# Patient Record
Sex: Male | Born: 1935 | Race: White | Hispanic: No | Marital: Married | Smoking: Former smoker
Health system: Southern US, Community
[De-identification: ages and names within clinical notes are randomized; demographics above are authoritative.]

## PROBLEM LIST (undated history)

## (undated) DIAGNOSIS — Z951 Presence of aortocoronary bypass graft: Secondary | ICD-10-CM

## (undated) DIAGNOSIS — C61 Malignant neoplasm of prostate: Secondary | ICD-10-CM

## (undated) DIAGNOSIS — Z8546 Personal history of malignant neoplasm of prostate: Secondary | ICD-10-CM

## (undated) DIAGNOSIS — M1990 Unspecified osteoarthritis, unspecified site: Secondary | ICD-10-CM

## (undated) DIAGNOSIS — E785 Hyperlipidemia, unspecified: Secondary | ICD-10-CM

## (undated) DIAGNOSIS — Z972 Presence of dental prosthetic device (complete) (partial): Secondary | ICD-10-CM

## (undated) DIAGNOSIS — I480 Paroxysmal atrial fibrillation: Secondary | ICD-10-CM

## (undated) DIAGNOSIS — R7989 Other specified abnormal findings of blood chemistry: Secondary | ICD-10-CM

## (undated) DIAGNOSIS — I2510 Atherosclerotic heart disease of native coronary artery without angina pectoris: Secondary | ICD-10-CM

## (undated) DIAGNOSIS — E0590 Thyrotoxicosis, unspecified without thyrotoxic crisis or storm: Secondary | ICD-10-CM

## (undated) HISTORY — PX: PROSTATECTOMY: SHX69

## (undated) HISTORY — PX: TOTAL HIP ARTHROPLASTY: SHX124

## (undated) HISTORY — DX: Thyrotoxicosis, unspecified without thyrotoxic crisis or storm: E05.90

## (undated) HISTORY — DX: Other specified abnormal findings of blood chemistry: R79.89

## (undated) HISTORY — PX: APPENDECTOMY: SHX54

## (undated) HISTORY — DX: Personal history of malignant neoplasm of prostate: Z85.46

## (undated) HISTORY — DX: Paroxysmal atrial fibrillation: I48.0

## (undated) HISTORY — PX: MOHS SURGERY: SUR867

## (undated) HISTORY — DX: Hyperlipidemia, unspecified: E78.5

## (undated) HISTORY — PX: IR ESOPHAGUS DILITATION RETRO FLUORO: IMG5574

## (undated) HISTORY — PX: HERNIA REPAIR: SHX51

## (undated) HISTORY — DX: Atherosclerotic heart disease of native coronary artery without angina pectoris: I25.10

---

## 1999-06-30 ENCOUNTER — Encounter: Payer: Self-pay | Admitting: *Deleted

## 1999-06-30 ENCOUNTER — Ambulatory Visit (HOSPITAL_COMMUNITY): Admission: RE | Admit: 1999-06-30 | Discharge: 1999-07-01 | Payer: Self-pay | Admitting: *Deleted

## 2000-02-21 ENCOUNTER — Ambulatory Visit (HOSPITAL_COMMUNITY): Admission: RE | Admit: 2000-02-21 | Discharge: 2000-02-21 | Payer: Self-pay | Admitting: *Deleted

## 2000-08-17 ENCOUNTER — Emergency Department (HOSPITAL_COMMUNITY): Admission: EM | Admit: 2000-08-17 | Discharge: 2000-08-17 | Payer: Self-pay | Admitting: Emergency Medicine

## 2000-08-17 ENCOUNTER — Encounter: Payer: Self-pay | Admitting: Emergency Medicine

## 2000-08-30 ENCOUNTER — Inpatient Hospital Stay (HOSPITAL_COMMUNITY): Admission: EM | Admit: 2000-08-30 | Discharge: 2000-08-30 | Payer: Self-pay | Admitting: Emergency Medicine

## 2000-08-30 ENCOUNTER — Encounter: Payer: Self-pay | Admitting: Neurology

## 2001-11-11 ENCOUNTER — Encounter: Payer: Self-pay | Admitting: Emergency Medicine

## 2001-11-11 ENCOUNTER — Inpatient Hospital Stay (HOSPITAL_COMMUNITY): Admission: EM | Admit: 2001-11-11 | Discharge: 2001-11-13 | Payer: Self-pay | Admitting: Emergency Medicine

## 2002-04-09 ENCOUNTER — Encounter: Payer: Self-pay | Admitting: Emergency Medicine

## 2002-04-09 ENCOUNTER — Observation Stay (HOSPITAL_COMMUNITY): Admission: EM | Admit: 2002-04-09 | Discharge: 2002-04-10 | Payer: Self-pay | Admitting: Emergency Medicine

## 2004-02-17 ENCOUNTER — Other Ambulatory Visit: Payer: Self-pay

## 2004-02-17 ENCOUNTER — Emergency Department: Payer: Self-pay | Admitting: Emergency Medicine

## 2004-06-19 ENCOUNTER — Ambulatory Visit: Payer: Self-pay | Admitting: General Surgery

## 2004-06-19 ENCOUNTER — Other Ambulatory Visit: Payer: Self-pay

## 2004-07-20 ENCOUNTER — Ambulatory Visit: Payer: Self-pay

## 2004-08-19 ENCOUNTER — Ambulatory Visit: Payer: Self-pay

## 2005-02-18 ENCOUNTER — Observation Stay (HOSPITAL_COMMUNITY): Admission: EM | Admit: 2005-02-18 | Discharge: 2005-02-19 | Payer: Self-pay | Admitting: Emergency Medicine

## 2006-01-10 HISTORY — PX: CARDIAC CATHETERIZATION: SHX172

## 2006-09-10 ENCOUNTER — Other Ambulatory Visit: Payer: Self-pay

## 2006-09-10 ENCOUNTER — Emergency Department: Payer: Self-pay | Admitting: Internal Medicine

## 2006-09-17 ENCOUNTER — Emergency Department: Payer: Self-pay | Admitting: Emergency Medicine

## 2006-09-17 ENCOUNTER — Inpatient Hospital Stay (HOSPITAL_COMMUNITY): Admission: AD | Admit: 2006-09-17 | Discharge: 2006-09-21 | Payer: Self-pay | Admitting: Cardiovascular Disease

## 2008-03-24 ENCOUNTER — Ambulatory Visit: Payer: Self-pay | Admitting: Cardiology

## 2008-03-30 ENCOUNTER — Emergency Department: Payer: Self-pay | Admitting: Emergency Medicine

## 2008-03-31 ENCOUNTER — Ambulatory Visit: Payer: Self-pay | Admitting: Cardiovascular Disease

## 2009-01-19 ENCOUNTER — Ambulatory Visit: Payer: Medicare Other | Admitting: Family Medicine

## 2009-06-09 ENCOUNTER — Encounter: Payer: Self-pay | Admitting: Cardiovascular Disease

## 2009-06-17 ENCOUNTER — Ambulatory Visit: Payer: Self-pay | Admitting: Cardiovascular Disease

## 2009-06-17 DIAGNOSIS — I10 Essential (primary) hypertension: Secondary | ICD-10-CM | POA: Insufficient documentation

## 2009-06-17 DIAGNOSIS — Z8679 Personal history of other diseases of the circulatory system: Secondary | ICD-10-CM

## 2009-06-17 DIAGNOSIS — E785 Hyperlipidemia, unspecified: Secondary | ICD-10-CM

## 2009-06-17 DIAGNOSIS — I2510 Atherosclerotic heart disease of native coronary artery without angina pectoris: Secondary | ICD-10-CM

## 2009-12-15 ENCOUNTER — Ambulatory Visit: Payer: Medicare Other | Admitting: Family Medicine

## 2010-02-09 NOTE — Assessment & Plan Note (Signed)
Summary: F/U 1 YEAR  Medications Added AMIODARONE HCL 200 MG TABS (AMIODARONE HCL) once daily VYTORIN 10-40 MG TABS (EZETIMIBE-SIMVASTATIN) Take one tablet by mouth dailyat bedtime PLAVIX 75 MG TABS (CLOPIDOGREL BISULFATE) Take one tablet by mouth daily ASPIRIN 81 MG TBEC (ASPIRIN) Take one tablet by mouth daily MIRALAX  POWD (POLYETHYLENE GLYCOL 3350) as directed CLONAZEPAM 0.5 MG TABS (CLONAZEPAM) as needed LOSARTAN POTASSIUM 50 MG TABS (LOSARTAN POTASSIUM) Take 1 tablet by mouth once a day      Allergies Added: ! * STATINS  Visit Type:  1year f/u Primary Provider:  Ailene Ards, M.D.  CC:  pt. previously seen by Dr. Daleen Squibb.  No cardiac complaints..  History of Present Illness: [name] is a very pleasant 77 year old gentleman with a history of coronary artery disease, stent placed to his LAD in 1993, stent placed to his mid RCA in 2003, in-stent restenosis with repeat stent placed to his LAD in September 08, stent placed to his RCA at the same time and await, stress test in February 2009 which showed no ischemia, also with history of atrial fibrillation per his report of these details are unavailable to [place] at this time was maintained on low dose amiodarone who presents to reestablish care. He is been seen recently by myself at [facility].  Overall he states that he is doing well. He is not exercising like he should. He would like to do some light weights and treadmill at home. He denies any shortness of breath or chest pain. He is tolerating his medications well.  Preventive Screening-Counseling & Management  Alcohol-Tobacco     Smoking Status: quit  Caffeine-Diet-Exercise     Does Patient Exercise: no      Drug Use:  no.    Current Medications (verified): 1)  Amiodarone Hcl 200 Mg Tabs (Amiodarone Hcl) .... Once Daily 2)  Vytorin 10-40 Mg Tabs (Ezetimibe-Simvastatin) .... Take One Tablet By Mouth Dailyat Bedtime 3)  Plavix 75 Mg Tabs (Clopidogrel  Bisulfate) .... Take One Tablet By Mouth Daily 4)  Aspirin 81 Mg Tbec (Aspirin) .... Take One Tablet By Mouth Daily 5)  Micardis 20 Mg Tabs (Telmisartan) .... Take One Tablet By Mouth Daily 6)  Miralax  Powd (Polyethylene Glycol 3350) .... As Directed 7)  Clonazepam 0.5 Mg Tabs (Clonazepam) .... As Needed  Allergies (verified): 1)  ! * Statins  Past History:  Family History: Last updated: 06/17/2009 Family History of Coronary Artery Disease: mother & sister. Family History of Diabetes: father & sister.  Social History: Last updated: 06/17/2009 Retired --[employer] Married  Tobacco Use - Former.  Alcohol Use - yes Regular Exercise - no Drug Use - no  Risk Factors: Exercise: no (06/17/2009)  Risk Factors: Smoking Status: quit (06/17/2009)  Past Medical History: CAD Hyperlipidemia Hypertension Hyperthyroidism  Past Surgical History: stents x 5 Appendectomy Prostatectomy  Family History: Family History of Coronary Artery Disease: mother & sister. Family History of Diabetes: father & sister.  Social History: Retired --[employer] Married  Tobacco Use - Former.  Alcohol Use - yes Regular Exercise - no Drug Use - no Smoking Status:  quit Does Patient Exercise:  no Drug Use:  no  Review of Systems  The patient denies fever, weight loss, weight gain, vision loss, decreased hearing, hoarseness, chest pain, syncope, dyspnea on exertion, peripheral edema, prolonged cough, abdominal pain, incontinence, muscle weakness, depression, and enlarged lymph nodes.    Vital Signs:  Patient profile:   77 year old male  Height:      74 inches Weight:      169 pounds BMI:     21.78 Pulse rate:   58 / minute BP sitting:   121 / 74  (left arm) Cuff size:   large  Vitals Entered By: Bishop Dublin, CMA (June 17, 2009 3:28 PM)  Physical Exam  General:  Well developed, well nourished, in no acute distress. Head:  normocephalic and atraumatic Neck:  Neck supple, no JVD.  No masses, thyromegaly or abnormal cervical nodes. Lungs:  Clear bilaterally to auscultation and percussion. Heart:  Non-displaced PMI, chest non-tender; regular rate and rhythm, S1, S2 without murmurs, rubs or gallops. Carotid upstroke normal, no bruit.. Pedals normal pulses. No edema, no varicosities. Abdomen:  Bowel sounds positive; abdomen soft and non-tender without masses Msk:  Back normal, normal gait. Muscle strength and tone normal. Pulses:  pulses normal in all 4 extremities Extremities:  No clubbing or cyanosis. Neurologic:  Alert and oriented x 3. Skin:  Intact without lesions or rashes. Psych:  Normal affect.    EKG  Procedure date:  06/17/2009  Findings:      normal sinus rhythm with rate 58 beats per minute, first degree AV block, no significant ST or T wave changes.  Impression & Recommendations:  Problem # 1:  CAD, NATIVE VESSEL (ICD-414.01) history of coronary artery disease, negative stress test in February 2009, last stent placed to his LAD and RCA in September 2008.  No symptoms of angina. I've encouraged him to increase his exercise.  His updated medication list for this problem includes:    Plavix 75 Mg Tabs (Clopidogrel bisulfate) .[name]... Take one tablet by mouth daily    Aspirin 81 Mg Tbec (Aspirin) .[name]... Take one tablet by mouth daily  Problem # 2:  HYPERLIPIDEMIA-MIXED (ICD-272.4) cholesterol is reasonably well controlled. His LDL is slightly above goal at 79 though cholesterol is 139, HDL 40. We will continue him on his current dose of Vytorin 10/40 mg daily.  His updated medication list for this problem includes:    Vytorin 10-40 Mg Tabs (Ezetimibe-simvastatin) .[name]... 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 We have offered to change this to losartan and he will take one pill a day.  His updated medication list for this problem includes:    Aspirin 81  Mg Tbec (Aspirin) .[name]... Take one tablet by mouth daily    Losartan Potassium 50 Mg Tabs (Losartan potassium) .[name]... Take 1 tablet by mouth once a day  Problem # 4:  ATRIAL FIBRILLATION (ICD-427.31) He is current on low-dose amiodarone. Will try to obtain the old records to document atrial fibrillation history.  His updated medication list for this problem includes:    Amiodarone Hcl 200 Mg Tabs (Amiodarone hcl) ..... Once daily    Plavix 75 Mg Tabs (Clopidogrel bisulfate) .[name]... Take one tablet by mouth daily    Aspirin 81 Mg Tbec (Aspirin) .[name]... Take one tablet by mouth daily  Patient Instructions: 1)  Your physician has recommended you make the following change in your medication: Stop micardis and start losartan 50 mg daily 2)  Your physician wants you to follow-up in:   1 year You will receive a reminder letter in the mail two months in advance. If you don't receive a letter, please call our office to schedule the follow-up appointment. Prescriptions: LOSARTAN POTASSIUM 50 MG TABS (LOSARTAN POTASSIUM) Take 1 tablet  by mouth once a day  #30 x 11   Entered by:   Benedict Needy, RN   Authorized by:   Dossie Arbour MD   Signed by:   Benedict Needy, RN on 06/17/2009   Method used:   Electronically to        [facility] [address] (retail)       [address] [place], [place]  [address]       Ph: [phone]       Fax: [phone]   RxID:   [id number]

## 2010-05-25 NOTE — Assessment & Plan Note (Signed)
[facility] OFFICE NOTE   NAME:Mcgaugh, KIN GALBRAITH                       MRN:          [id number]  DATE:03/31/2008                            DOB:          04-May-1932    PRIMARY CARE PHYSICIAN:  Nilda Simmer, MD   HISTORY OF PRESENT ILLNESS:  Mr. Sondgeroth is a pleasant 77 year old  Caucasian male with a history of paroxysmal atrial fibrillation,  coronary artery disease, status post multiple stenting procedures,  hypertension, hyperlipidemia, and thyroiditis who has been followed in  this office by Dr. Valera Castle.  The patient is added on today to see me  after he was seen in the emergency room last night.  He tells me that on  Saturday he and his wife were lifting some heavy objects and he thinks  he pulled a muscle in his chest Walk[place].  It was noted to be tender over  his substernal area and persisted throughout the night on Saturday and  through the day on Sunday.  He was seen in the emergency room at  [facility] where he was found to have normal  cardiac enzymes and no significant abnormalities in his blood work.  His  EKG showed no ischemic changes.  He was given narcotics and a muscle  relaxant which he took last night.  He tells me that he feels the  medications he took made him feel a little nauseous this morning.  His  chest discomfort has gotten better overnight.  He can touch the middle  of his chest Sedivy and one particular spot seems to cause a painful  sensation.  Once again, there was no radiation of this pain into his  neck, jaw, or arm.  There was no associated diaphoresis, nausea,  vomiting, palpitations, or shortness of breath.   CURRENT MEDICATIONS:  1. Amiodarone 200 mg once daily.  2. Micardis 20 mg once daily.  3. Plavix 75 mg once daily.  4. Vytorin 10/40 mg once daily.  5. Aspirin 81 mg once daily.  6. Polyethylene glycol 17 g once daily.  7. Clonazepam 0.5 mg at  bedtime.  8. Vitamin D 1000 units once daily.  9. Zyrtec over-the-counter 10 mg once daily.   REVIEW OF SYSTEMS:  As stated in the history of present illness is  otherwise negative.   PHYSICAL EXAMINATION:  VITAL SIGNS:  Blood pressure 129/74, pulse 48 and  regular, respirations 12 and unlabored.  GENERAL:  This is a pleasant, middle-aged Caucasian male in no acute  distress.  He is alert and oriented x3.  HEENT:  Normal.  SKIN:  Warm and dry.  NECK:  No JVD.  No carotid bruits.  No thyromegaly.  No lymphadenopathy.  LUNGS:  Clear to auscultation bilaterally without wheezes, rhonchi, or  crackles noted.  CARDIOVASCULAR:  Bradycardia without murmurs, gallops, or rubs noted.  ABDOMEN:  Soft and nontender.  Bowel sounds are present.  EXTREMITIES:  No evidence of edema.  Pulses are 2+ in all extremities.   DIAGNOSTIC STUDIES:  1. A 12-lead EKG obtained in the emergency  room at [facility] on March 31, 2007, shows sinus bradycardia with      first-degree AV block.  There are no ischemic changes noted.  2. Laboratory values from March 30, 2008, at [facility] shows a white blood cell count 6.3, hemoglobin 13, platelets      190, glucose 95, BUN 23, creatinine 1.33, sodium 140, potassium      4.0, troponin less than 0.1, total CK 60, and CK-MB 0.7.  3. Chest x-ray obtained at [facility] on March 30, 2008, shows no acute disease process in the chest.   ASSESSMENT AND PLAN:  This is a pleasant 77 year old Caucasian male with  a history of coronary artery disease, paroxysmal atrial fibrillation,  hypertension, hyperlipidemia, and thyroiditis, who presents today for  followup after he was seen in the emergency room with complaints of  reproducible mid chest pain.  It was most likely secondary to a  musculoskeletal cause.  The pain is almost completely resolved today.  I  do not think that his chest pain  represents a cardiac problem.  As  stated above his cardiac enzymes showed no changes and his EKG showed no  ischemic changes.  I do not think that any further ischemic workup is  necessary at this time.  His blood pressure is well controlled.  He is  known to have a slow heart rate and is asymptomatic with a heart rate of  48 today.  I would like to continue all his medications as currently  written.  He will continue to live a healthy lifestyle and will follow  up with Dr. Daleen Squibb as previously scheduled in 1 year.  The patient is  aware that he should call us should he have any change in his medical  status over the next year.  We will also call in nitroglycerin 0.4 mg  sublingually for him to use on an as-needed basis.      Verne Carrow, MD  Electronically Signed    CM/MedQ  DD: 03/31/2008  DT: 04/01/2008  Job #: [id number]

## 2010-05-25 NOTE — Discharge Summary (Signed)
NAMECHRISTOPHER, [name] NO.:  [id number]   MEDICAL RECORD NO.:  [id number]          PATIENT TYPE:  INP   LOCATION:  6532                         FACILITY:  [facility]   PHYSICIAN:  Nicki Guadalajara, M.D.     DATE OF BIRTH:  [birth year]   DATE OF ADMISSION:  09/17/2006  DATE OF DISCHARGE:  09/21/2006                               DISCHARGE SUMMARY   DISCHARGE DIAGNOSES:  1. Unstable angina pectoris, status post two interventions during this      admission.  2. Known history of coronary artery disease with previous percutaneous      coronary interventions.  3. Hypertension.  4. Hyperlipidemia, treated.  5. Paroxysmal atrial fibrillation on amiodarone, maintained sinus      rhythm/sinus bradycardia.   HOSPITAL COURSE:  This is a 77 year old, Caucasian gentleman with known  history of coronary artery disease and previous intervention in 1990,  with Taxus stent placed in proximal LAD and also status post drug-  eluting stent of the RCA in 2003.  He developed chest pain compatible  with unstable angina and presented to the emergency room at [facility] where he was evaluated and transported to [facility]  with unstable angina for further cardiology workup.  The patient was  transferred to [facility] on IV heparin and nitroglycerin and  Integrilin for heart catheterization.   Heart catheterization was performed on September 18, 2006, by Dr. Tresa Endo  and showed a high-grade stenosis of the LAD and 95% stenosis as well as  some in-stent restenosis of 40-50%.  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  Dr. Tresa Endo performed  the stenting of the LAD with drug-eluting Promise stent and the patient  was transported to the post catheterization unit in stable condition.  Because of his residual disease in the RCA, on September 20, 2006, the  patient underwent RCA stenting also performed by Dr. Tresa Endo.  At this  time,  Cypher stent was placed in the coronary artery.  The patient  tolerated this procedure well and was transferred to the unit in stable  condition.  He was able to ambulate around the ward after his bed rest  expired.   LABORATORY DATA AND X-RAY FINDINGS:  BMET showed sodium of 138,  potassium 4.0, chloride 109, CO2 24, BUN 10, creatinine 1.23, glucose  117.  Hemoglobin was 11.9, hematocrit 35.5, white blood cell count 7.3,  platelet 182.  Cardiac panel was negative before and after the first  intervention and the enzymes post second procedure revealed mild  elevation of troponin to 0.08 from the previous level of 0.04 yesterday.  CK was 49 and CK-MB 1.3.  This mild elevation of troponins was possibly  related to mild reperfusion effects.  TSH was normal and liver enzymes  were within normal limits as well.   DISCHARGE MEDICATIONS:  1. Plavix 75 mg daily.  2. Aspirin 325 mg daily.  3. Amiodarone 200 mg daily.  4. Micardis 40 mg daily.  5. Protonix 40 mg b.i.d.  6. Vytorin 10/20 mg daily.  7. Nitroglycerin was 0.4 mg sublingual p.r.n. every 5 minutes x3.   DIET:  Stay on low-fat, low-cholesterol diet.   ACTIVITY:  No driving or lifting greater than 5 pounds x5 days post  catheterization.   FOLLOWUP:  Dr. [name] will see the patient in our office on September  23, at 9:30 a.m.      Raymon Mutton, P.A.    ______________________________  Nicki Guadalajara, M.D.    MK/MEDQ  D:  09/21/2006  T:  09/22/2006  Job:  [id number]   cc:   Nilda Simmer, M.D.  [facility]

## 2010-05-25 NOTE — Cardiovascular Report (Signed)
NAME:  [name], [name] NO.:  [id number]   MEDICAL RECORD NO.:  [id number]          PATIENT TYPE:  INP   LOCATION:  6532                         FACILITY:  [facility]   PHYSICIAN:  Nicki Guadalajara, M.D.     DATE OF BIRTH:  [birth year]   DATE OF PROCEDURE:  09/20/2006  DATE OF DISCHARGE:                            CARDIAC CATHETERIZATION   INDICATIONS:  The patient is a 77 year old gentleman who has known prior  coronary artery disease and underwent initial PTCA of his LAD in 1990,  PTCA stenting of his LAD in June 2001, and stenting of his mid right  coronary artery in 2003.  On September 18, 2006, he underwent repeat  cardiac catheterization and was found to have a 95% stenosis in a gap  between the two previous placed LAD stents with diffuse 40-50% narrowing  within the proximal LAD stent.  He underwent successful stenting with a  2.75 x 28 mm drug-eluting Promos stent postdilated to 3.03 mm in the  previously placed non drug-eluting stents to the LAD and covering the  gap stenosis.  He also was found to have mild disease in his circumflex  vessel, and a widely patent mid RCA stent was 75% stenosis proximal to  the stented segment in the right coronary artery.  He is now following  hydration.  He is now brought back to the catheterization laboratory for  attempt at intervention to the RCA.   PROCEDURE:  After premedication with Valium 5 mg intravenously, the  patient was prepped and draped in usual fashion.  A 6-French diagnostic  catheter was used for relook into the left coronary system and with a  demonstration of widely patent stent, the decision was to proceed with  intervention to the RCA system.  Bivalirudin was used for  anticoagulation.  The patient was already on Plavix.  Initially an FL-4  guide 6-French was inserted.  IC nitroglycerin was administered down the  RCA.  Prowater wire was advanced down the RCA.  It was felt that an 18  mm stent was necessary in  order to overlap the previously placed stent  in the mid segment which was a Cypher stent 13 mm.  A 3.5 x 18 mm Cypher  stent was then inserted but due to backup difficulties, this was never  able to traverse the proximal sharp bend in the RCA.  After multiple  attempts, the whole system was then removed and a 6-French hockey-stick  guide with side holes was inserted.  Wire was re-advanced down the RCA.  A 3.5 x 18 mm Cypher stent was unable to make the proximal sharp bend of  the RCA and was placed in tandem to the previously placed mid RCA stent  extending proximally.  Two dilatations were done up to 16 atmospheres.  A 4 x 12 mm Quantum balloon was used for post-stent dilatation with  dilatation up to 3.9 mm.  Scout angiography confirmed an excellent  angiographic result.  There was brisk TIMI-3 flow.  There was no  evidence for dissection.  He tolerated the procedure well.   HEMODYNAMIC  DATA:  Central aortic pressure was 162/91.  The patient did  receive several doses of IC nitroglycerin.   ANGIOGRAPHIC DATA:  Initial look in the left coronary system revealed  widely patent stented segment in the LAD without any flow limitation to  the diagonal vessel.   The right coronary artery had an upward takeoff and then had 80%  somewhat eccentric stenosis beyond the proximal bend and before the next  bend and prior to the previously placed mid RCA stent.  Following  successful insertion of a 3.5 x 18 mm Cypher stent placed in tandem  proximally to the previously placed stent and postdilated to 3.9 mm, the  entire region was reduced to 0%.  There is brisk TIMI-3 flow.  There was  no evidence for dissection.   IMPRESSION:  1. Successful percutaneous coronary intervention to the right coronary      artery with ultimate insertion of a 3.5 x 18 mm Cypher stent placed      in tandem proximally to the previously placed mid right coronary      artery Cypher stent      and postdilated 3.9 mm done  with bivalirudin anticoagulation in      addition to the patient being on Plavix and utilization of IC      nitroglycerin.  2. Widely patent stent in the left anterior descending system.           ______________________________  Nicki Guadalajara, M.D.     TK/MEDQ  D:  09/20/2006  T:  09/21/2006  Job:  [id number]   cc:   [name], MD  Nilda Simmer, M.D.

## 2010-05-25 NOTE — Assessment & Plan Note (Signed)
[facility] OFFICE NOTE   NAME:Lowman, MADDEX GARLITZ                       MRN:          [id number]  DATE:03/24/2008                            DOB:          March 17, 1932    I was asked by Dr. Nilda Simmer to establish and to consult on Thiago Ragsdale for his cardiovascular and cardiac issues.   HISTORY OF PRESENT ILLNESS:  He is a pleasant, inquisitive 77 year old  married white male who comes with his wife today to establish with our  practice.   From extensive outside records which I have reviewed from [facility], Mr. Cozzens initially had coronary artery  disease as diagnosed in 65 at [facility].  At that time, he had a PTCA of  the LAD.  It is not clear whether or not he had a stent to the LAD at  that time.   He has subsequently had stenting to his mid right coronary artery in  2003.  In September 2008, he developed some chest discomfort, worrisome  for unstable angina leading to admission to [facility].  He came back to [facility] where he underwent cardiac catheterization  by Dr. Nicki Guadalajara, showed in-stent restenosis of the LAD stent and a  95% stenosis between the gap of the previously placed second stent.  He  subsequently underwent stent placement to the LAD.  Two days later, he  underwent successful intervention to the right coronary artery with a  Cypher stent.  This was placed proximally to the previously placed  stent.  A stress nuclear study in February 2009 showed normal perfusion  and excellent exercise capacity and normal left ventricular function  with an EF of 81%.   He is currently having no angina or ischemic symptoms.  He is wanted to  know if he has had too many stents and just how many stents can you  have?   PAST MEDICAL HISTORY:  He also had a history of paroxysmal atrial  fibrillation.  He is on amiodarone.  Apparently, he had some  thyroiditis  that was treated by Dr. Sharin Grave with steroids back in 2006.  He has  not been back for followup, though he was told at that time he had some  nodules.  His wife would very much like him to go back and I have agreed  with this plan.   He has a history of hypertension and hyperlipidemia.   CURRENT MEDICATIONS:  1. Amiodarone 2 mg a day.  2. Micardis 20 mg a day.  3. Plavix 75 mg a day.  4. Vytorin 10/40 daily.  5. Polyethylene glycol at bedtime.  6. Clonazepam 0.5 mg 1 at bedtime.  7. Vitamin D3 1000 units daily.  8. Zyrtec over-the-counter 10 mg a day.  9. He carries sublingual nitroglycerin.   He does not smoke or drink.  He drinks half a cup of wine per day.  He  walks two and a quarter miles 2-3 times per week.   SURGICAL HISTORY:  He has had a  hernia repair, prostatectomy for  prostate cancer, appendectomy.   FAMILY HISTORY:  Significant for premature coronary disease.  His sister  died at age 14 of heart disease in mother died at age 10.   SOCIAL HISTORY:  He is married.  He is retired since May 2000.  He is  married and has 3 children.   ALLERGIES:  He lists STATINS and AMBIEN as allergies, though he is on  Vytorin.   REVIEW OF SYSTEMS:  Other than some weight loss is negative.  He is  plagued by chronic constipation.  Rest of the review of systems are  negative.   PHYSICAL EXAMINATION:  VITAL SIGNS:  His blood pressure today was  132/70, his pulse is 59 and regular.  His height is 6 feet 2, weight is  170 pounds.  HEENT:  Essentially normal.  NECK:  Supple.  Carotid upstrokes were equal bilaterally without bruits.  Thyroid, I could not feel any nodules or any tenderness.  Trachea is  midline.  There is no nodules or nodes present that I could appreciate.  CHEST:  Lungs are clear to auscultation and percussion.  HEART:  Nondisplaced PMI, normal S1 and S2.  ABDOMEN:  Soft, good bowel sounds.  No midline bruit.  No hepatomegaly.  EXTREMITIES:  No  cyanosis, clubbing, or edema.  Pulses were present and  normal.  There is no sign of DVT.  NEURO:  Intact.  PSYCH:  He is pleasant but a little bit cynical.   EKG shows sinus bradycardia the first-degree AV block at 232  milliseconds.  Compared to a previous EKG, there has been no significant  change.   Outside records have been reviewed.  Recent blood work was stable  including a TSH and PSA.  His lipids are being followed by Dr. Katrinka Blazing.  In addition, he has had carotid Dopplers which show no significant  obstructive plaque and he has also had abdominal ultrasound which showed  no aneurysm.   ASSESSMENT AND PLAN:  Mr. Andreoni is stable at the present time.  He is on  a good medical program which I will not change.  He had multiple  questions and I have tried to answer all of them.  I have advised that  he go back and see Dr. Patrecia Pace for his thyroid abnormalities.  His TSH  is normal, however.   I will see him back in a year.  At that time, we will arrange for an  objective assessment is coronary disease.     Thomas C. Daleen Squibb, MD, [facility]  Electronically Signed    TCW/MedQ  DD: 03/24/2008  DT: 03/25/2008  Job #: [id number]   cc:   Nilda Simmer, M.D.

## 2010-05-25 NOTE — Discharge Summary (Signed)
[name], [name] NO.:  [id number]   MEDICAL RECORD NO.:  [id number]          PATIENT TYPE:  INP   LOCATION:  6532                         FACILITY:  [facility]   PHYSICIAN:  Nicki Guadalajara, M.D.     DATE OF BIRTH:  06/07/1932   DATE OF ADMISSION:  09/17/2006  DATE OF DISCHARGE:  09/21/2006                               DISCHARGE SUMMARY   DISCHARGE DIAGNOSES:  1. Unstable angina pectoris status post two interventions during this      admission by Dr. Tresa Endo.  2. Known previous coronary artery disease with intervention.  3. Paroxysmal atrial fibrillation maintaining sinus rhythm/sinus      bradycardia on amiodarone therapy.  4. Hypertension.  5. Hyperlipidemia - treated.  6. History of left ventricular dysfunction with ejection fraction 40-      45% - normally up now by catheterization this admission.  7. Small abdominal aortic aneurysm.   HISTORY OF PRESENT ILLNESS:  This is a 77 year old pleasant Caucasian  gentleman patient of Dr. [name] who presented to the emergency room on  September 17, 2006, with complaints of chest pain.  Initially, the  patient went to [facility], was given nitroglycerin with relief  of pain, but then his high blood pressure dropped to 90s and heart rate  to   Dictation ended at this point      Raymon Mutton, P.A.    ______________________________  Nicki Guadalajara, M.D.    MK/MEDQ  D:  09/21/2006  T:  09/21/2006  Job:  [id number]

## 2010-05-25 NOTE — Cardiovascular Report (Signed)
NAME:  [name], [name] NO.:  [id number]   MEDICAL RECORD NO.:  [id number]          PATIENT TYPE:  INP   LOCATION:  2316                         FACILITY:  [facility]   PHYSICIAN:  Nicki Guadalajara, M.D.     DATE OF BIRTH:  1932-02-05   DATE OF PROCEDURE:  09/18/2006  DATE OF DISCHARGE:                            CARDIAC CATHETERIZATION   INDICATIONS:  [name] is a 77 year old gentleman who has known  coronary artery disease and is status post prior percutaneous  intervention with initial PTCA of his LAD in 1990, and repeat PTCA and  stenting of his LAD in June 2001.  At that time, he had a 2.75 x 18-mm  Taxus stent placed just proximal to the diagonal vessel and after the  diagonal vessel had a 2.5 x 8-mm stent.  He also is status post  intervention utilizing a drug-eluting stent to his right coronary artery  in 2003.  Recently, the patient has developed chest pain syndrome  compatible with unstable angina, leading to his [facility] evaluation and ultimate transport to [facility]  with unstable angina.  He has been on Integrilin, heparin, in addition  to IC nitroglycerin.  Definitive catheterization is now performed.   PROCEDURE:  After premedication with initially morphine, the patient  prepped and draped in the usual fashion.  Right femoral artery was  punctured anteriorly, and a 5-French sheath was inserted.  Diagnostic  catheterization was done utilizing 5-French Judkins for left and right  coronary catheters.  A 5-French pigtail catheter was used for biplane  single left  ventriculography, as well as distal aortography.  With a  demonstration of a subtotal LAD lesion just between the two previously  placed LAD stents in a gap where the diagonal arose as well as mild to  moderate in-stent restenosis in the proximal LAD stent, the decision was  made to attempt intervention.  The patient was given 600 mg of oral  Plavix.  His  5-French sheath was upgraded to a 6-French system.  He was  given 4000 units of heparin and was maintained on his Integrilin  infusion.  After demonstration of therapeutic anticoagulation, a FL 6-  [place] Q-curve 3.5 guide was used for the intervention.  A long Luge  wire was advanced down the LAD.  Initial dilatation was done with a 2.5  x 20-mm Maverick balloon with dilatation within the proximal LAD stent  as well as in the gap in the proximal portion of the stent just beyond  the diagonal vessel.  Since it was felt to cover most of the previously  stented segment, a 28-mm stent should do this.  A 2.75 x 28-mm Promus  stent was then successfully inserted and deployed x2 up to 14  atmospheres.  Post-stent dilatation was done utilizing a 3.0 x 20-mm  Quantum balloon with dilatation up to 3.03 mm.  Scout angiography  confirmed an excellent angiographic result with brisk TIMI 3 flow and no  evidence for dissection.   HEMODYNAMIC DATA:  Central aortic pressure was 141/73.  Left left  ventricular pressure was 144/12.  Central aortic pressure is 144/70.   ANGIOGRAPHIC DATA:  Left main coronary artery was angiographically  normal and bifurcated into an LAD and left circumflex system.   The LAD had a stent placed just beyond first diagonal vessel in the  region of the septal perforating artery extending just proximal to the  second diagonal vessel.  There was diffuse 40-50% in-stent narrowing  within this stented segment.  Just beyond the stent in the gap prior to  the second stent, there was focal 95% stenosis in the region of the  diagonal takeoff.  The 2.5 x 8-mm stent was placed just after the  diagonal vessel.  There was mild 30% narrowings in the LAD distally in  the mid distal segment.   The circumflex vessel was a moderated size vessel.  Gave rise to one  first marginal vessel that had diffuse luminal irregularity of 20%.  There was 50% narrowing in the circumflex between the OM1  and OM2  takeoff.   The right coronary artery had a mild upper takeoff.  There was 75%  stenosis beyond the proximal bend but proximal to the previously placed  Cypher stent which was widely patent without restenosis.  The RCA  supplied a PDA, inferior LV branch and posterolateral vessel.   Biplane single left ventriculography revealed normal LV contractility  without focal segmental Rosetti motion abnormalities.   Distal aortography was done.  There was no evidence for renal artery  stenosis.  There was mild aneurysmal dilatation of the distal infrarenal  aorta, and this was mildly tortuous.  There was mild 20% narrowing in  the right common iliac artery.   Following successful percutaneous coronary intervention to the LAD with  initial predilatation with a 2.5 x 20-mm Maverick balloon, insertion of  a 2.75 x 28-mm drug-eluting stent which covered the previously placed  stents, the 95% focal narrowing and the previous in-stent narrowing were  all reduced to 0%.  There was brisk TIMI 3 flow without evidence for  dissection.   IMPRESSION:  1. Normal LV function.  2. Progressive coronary artery disease with previously placed 2.75 x      18-mm stent in the LAD proximally with in-stent narrowing of 40-50%      followed by 95% focal stenosis and a gap between the previously      placed second stent at the region of the diagonal 2 take off with      mild 30% LAD narrowings in the distal segment; 20% luminal      irregularity in the OM1 vessel, the circumflex with 50% narrowing      in the AV groove circumflex between OM1/OM2; and widely patent mid      RCA stent but evidence for 75% eccentric stenosis proximal to the      previously placed stent.  3. Mild aneurysmal dilatation of the infrarenal aorta.  4. Successful percutaneous coronary intervention with ultimate      insertion of a new 2.75 x 28-mm drug-eluting Promus stent      postdilated to 3.03 mm in the left anterior descending  artery with      the stenoses being reduced to 0%, done with Integrilin/weight-      adjusted heparinization and oral Plavix.  5. Probable planned stage intervention to the RCA proximally following      hydration.           ______________________________  Nicki Guadalajara, M.D.     TK/MEDQ  D:  09/18/2006  T:  09/19/2006  Job:  [id number]   cc:   Nicki Guadalajara, M.D.  [name], M.D.  Nilda Simmer, M.D.

## 2010-05-28 NOTE — H&P (Signed)
NAME:  [name], [name] NO.:  [id number]   MEDICAL RECORD NO.:  [id number]                   PATIENT TYPE:  INP   LOCATION:  6531                                 FACILITY:  [facility]   PHYSICIAN:  [name], M.D.             DATE OF BIRTH:  07/22/1932   DATE OF ADMISSION:  04/09/2002  DATE OF DISCHARGE:                                HISTORY & PHYSICAL   CHIEF COMPLAINT:  Irregular heart rate.   HISTORY OF PRESENT ILLNESS:  The patient is a 77 year old male with a  history of coronary disease and PAF.  He has been in sinus rhythm on  Amiodarone 100 mg a day and aspirin.  His last catheterization was November  2003 and at that time he had a Sypher Stent placed to his RCA.  His EF at  that time was 40 to 45%.  Yesterday evening, he noted an irregular heart  rhythm.  He denies any associated chest pain or shortness of breath.  In the  emergency room, he apparently converted spontaneously, unfortunately at the  time of this dictation, no  or telemetry strips from the emergency room are available.  He is now in  sinus rhythm, sinus bradycardia.  The patient says he did take an extra 200  mg of Amiodarone last night.   PAST MEDICAL HISTORY:  Remarkable for coronary artery disease.  He had an  LAD angioplasty in 1993 at [facility].  He had an LAD stent placed in June 2001.  He had a negative Cardiolite study in January 2003 but had persistent chest  pain and ultimately was catheterized November 2003 and underwent RCA  stenting.  The LAD site was okay at that time.  He has moderate to mild LV  dysfunction with EF of 40 to 45%.  He has treated hyperlipidemia, his last  LDL was 77 in November 2003.  He has a history of an elevated PSA and Dr.  [name] his primary care doctor is to evaluate this further.  He has mild  hypertension.  He had bilateral hernia repair in the past.  He has a history  of erectile dysfunction and does not take beta blockers.   CURRENT  MEDICATIONS:  1. Lipitor 20 mg q.d.  2. Amiodarone 100 mg q.d.  3. Hydrochlorothiazide 12.5 q.d.  4. Aspirin one a day.   ALLERGIES:  He is intolerant to NIASPAN and AMBIEN but has no other drug  allergies.   SOCIAL HISTORY:  He is married.  He is a nonsmoker.  He has three children  and three grandchildren.   FAMILY HISTORY:  Remarkable for diabetes and coronary artery disease.  His  mother died in her 41s of an MI and also diabetes.  He had one sister who  died in her 37s complications of diabetes and heart attack.  His brother is  followed by Dr. Elsie Lincoln, he has a  history of bypass surgery.   REVIEW OF SYMPTOMS:  The patient says that his blood sugar fasting recently  was slightly elevated at 120.  He has no history of GI bleeding or peptic  ulcer disease.  He has no history of kidney stones or renal disease.  He has  not had a recent fever or chills.  He is not taking any over the counter  medications.  He does wear glasses.   PHYSICAL EXAMINATION:  VITAL SIGNS:  Blood pressure 120/70, pulse 54,  respirations 12.  GENERAL:  He is a well-developed, well-nourished male in no acute distress.  HEENT:  Normocephalic.  Extraocular movements are intact.  Sclerae are  nonicteric.  He does wear glasses.  NECK:  Without JVD and without bruit.  CHEST:  Clear to auscultation and percussion.  CARDIAC:  Reveals a regular rate and rhythm without murmur, rub or gallop.  Normal S1 and S2.  ABDOMEN:  Nontender.  No hepatosplenomegaly.  EXTREMITIES:  Without edema.  Distal pulses are 2+/4.  NEUROLOGIC:  Grossly intact.  He is awake, alert, oriented and cooperative.  He moves all extremities without obvious deficit.   His EKG is pending.   LABORATORY DATA:  Sodium 138, potassium 3.3, BUN 19, creatinine 1.2.  White  count 5.3, hemoglobin 14.7, hematocrit 43, platelets 185.  CK-MB is  negative.  Troponin is negative.  TSH and hemoglobin A1C are pending.   IMPRESSION:  1. Paroxysmal atrial  fibrillation with break through on low dose Amiodarone.  2. Coronary artery disease with left anterior descending artery intervention     in 1993 and left anterior descending artery stenting in June 2001.  Right     coronary artery stenting November 2003.  3. Mild left ventricle dysfunction with ejection fraction of 40 to 45%.  4. Treated hyperlipidemia, his low-density lipoprotein was 77 in November     2003.  5. Elevated blood sugar with strong family history of diabetes, will rule     out diabetes.  6. Mild hypertension.  7. Hypokalemia.  8. History of erectile dysfunction.   PLAN:  We will replace potassium.  Will increase his daily Amiodarone to 200  mg a day.  We will recheck his TSH, this was normal in November but he is on  Amiodarone.  He may need Coumadin,  this will be discussed with M.D.     Abelino Derrick, P.A.                      [name], M.D.    Lenard Lance  D:  04/09/2002  T:  04/09/2002  Job:  [id number]

## 2010-05-28 NOTE — Consult Note (Signed)
[place]  Patient:    [name], [name] Visit Number: [id number] MRN: [id number]          Service Type: MED Location: 1800 [id number] [id number] Attending Physician:  Devoria Albe Adm. Date:  08/30/2000                            Consultation Report  DATE OF BIRTH:  [birth year]  PATIENTS ADDRESS:  [address], [place], [place] [address].  REASON FOR CONSULTATION:  This 77 year old, right-handed, white married male is seen in the emergency room at the request of Dr. Kenn File physician associate for evaluation of dizziness.  HISTORY OF PRESENT ILLNESS:  Mr. Laventure has a known history of coronary artery disease and underwent PTCA at [facility] in 1993. He subsequently had recurrent chest pain and underwent stent therapy x 2 in June of 2001. He has been on amiodarone for a history of paroxysmal atrial fibrillation. He has also been on a beta blocker. He has at least a one-month history of dizziness at times occurring when going to a standing position. He has also had episodes of yawning during the day. He recalls an episode two weeks ago while playing golf when he became very dizzy which continued into the next day, and he was seen at the [facility] Emergency Room without etiology found. He has also had an episode in which he developed double vision after taking Ambien in the past. Last evening, he took a 10 mg Ambien and awoke at 9:30 this morning which is late for him. He noted horizontal diplopia. He noted light headedness when standing, and this improved as he came to the emergency room. There was no chest pain or palpitation. He has noted that in staggering he would stagger both to the left or to the right.  CURRENT MEDICATIONS: 1. Lipitor 40 mg q.d. 2. Cordarone 100 mg q.d. 3. Coumadin 1.25 mg q.d. 4. Toprol XL 1/2 of a 25 mg q.d. 5. Ambien 10 mg q.h.s.  PHYSICAL EXAMINATION:  GENERAL:   Well-developed, pleasant, white male.  VITAL SIGNS:  Blood pressure lying left arm 140/80, standing left arm 140/80, sitting blood pressure right and left arm 140/80; heart rate was 51 to 54 and regular, he had a sinus bradycardia.  NECK:  No bruits were heard.  HEENT:  The tympanic membranes were clear.  NEUROLOGICAL:  Mental status:  He was alert and oriented x 3. Followed one, two, and three step commands. Cranial nerve examination revealed visual fields full. Disks flat. Extraocular movements full. Corneals present. No seventh nerve palsy present. Hearing decreased. Air conduction greater than bone conduction. Tongue was midline. The uvula was midline. Gags present. Bilateral ptosis. Motor exam revealed 5/5 strength proximally and distally in the upper and lower extremities. He had an outstretched hand and arm tremor. Sensory examination was intact to pinprick, touch, position, and vibration testing. Deep tendon reflexes were 2+, and plantar responses were downgoing. Gait examination revealed he is slightly unsteady. With Romberg testing, he would fall to the left and to the right. He did not pass point.  IMPRESSION: 1. Light headed sensation without definite history of vertigo, code 780.4. 2. Diplopia, code 368.2. 3. Possible medication affects, code 905.2. 4. Coronary artery disease, code 429.2. 5. Sinus bradycardia, code 427.9. 6. Benign essential tremor, code 333.1. 7. History of paroxysmal atrial fibrillation, code 427.31.  PLAN:  The  plan at this time is to obtain an MRI with intracranial MRA to rule out basilar artery stenosis and discharge him from the ER if these studies are unremarkable with discontinuation of his beta blocker. Attending Physician:  Devoria Albe DD:  08/30/00 TD:  08/31/00 Job: [id number]

## 2010-05-28 NOTE — Discharge Summary (Signed)
[name]. [facility]  Patient:    [name], [name]                       MRN: [id number] Adm. Date:  [id number] Disc. Date: [id number] Attending:  [name] Dictator:   Delice Bison Jernejcic, P.A.                           Discharge Summary  ADMITTING DIAGNOSES: 1.  Abnormal cardiolite stress test with exertional angina, elective     cardiac catheterization. 2.  Hyperlipidemia. 3.  Hypertension. 4.  Remote cigar use.  DISCHARGE DIAGNOSES: 1.  Coronary artery disease of left anterior descending coronary artery and     right coronary artery status post LAD PCI. 2.  Hyperlipidemia. 3.  Hypertension. 4.  Remote cigar use.  HISTORY OF PRESENT ILLNESS:  [name] is a pleasant 77 year old married white male patient of Dr. Damien Fusi in [place] referred for evaluation of abnormal stress test.  [name] has a history of coronary artery disease status post percutaneous transluminal coronary angioplasty of what the patient thinks was the left anterior descending coronary artery in 1993.  At that time his blockage was 95% and he was having substernal chest pressure and diaphoresis. He did not have a myocardial infarction. He was told that he had another blockage elsewhere but the details were unclear.  The patient apparently had paroxysmal atrial fibrillation in the past and has been on Coumadin but because of lone atrial fibrillation was taken off and has maintained sinus rhythm for several weeks.  Dr. Damien Fusi did a 2-D echocardiogram and stress Cardiolite to evaluate ischemic component that could contribute to the arrhythmia.  Myocardial perfusion study performed on Jun 10, 1999 revealed ejection fraction of 74% with reversible inferior and anterior ischemia, normal Villada motion.  He also did a 2-D echocardiogram on May 26, 1999 which showed no evidence of mitral, aortic or tricuspid regurgitation and an ejection fraction of 50%.  The patient had some exertional  chest tightness during the stress test and also complains of exertional tightness after prolonged walking.  He does have a strong family history of coronary artery disease.  The patient was be admitted for elective cardiac catheterization June 30, 1999 and was given a prescription for sublingual nitroglycerin.  PROCEDURE:  Cardiac catheterization on June 30, 1999 by Dr. [name].  There were no complications.  HOSPITAL COURSE:  The patient was admitted to [facility] for elective cardiac catheterization on June 30, 1999.  Pre-cath studies were within normal limits.  BUN 16, creatinine 1.4, INR 1.1, hemoglobin 14.0.  Dr. [name] proceeded with cardiac catheterization which revealed a normal left main, 99% proximal left anterior descending coronary artery, 70% mid left anterior descending coronary artery, and diffuse irregularities of the distal left anterior descending coronary artery, normal circumflex system, right coronary artery with 60% mid lesion.  Normal PLA and PDA.  Ejection fraction was 60% and no mitral regurgitation.  Dr. [name] proceeded with left anterior descending coronary artery PCI with percutaneous transluminal coronary angioplasty and stenting at a proximal and mid lesions reducing them to less than 10% each.  The patient tolerated the procedure well.  ReoPro was bolused and infused times 12 hours.  Plavix was started.  There were no problems with the sheath pull post catheterization. BUN and creatinine at discharge were 13 and 0.9.  The patient will be discharged to home on July 01, 1999.  MEDICATIONS ON DISCHARGE:  Flexeril 10 mg q.h.s., atenolol 25 mg in the morning and 25 in the evening, Norvasc 5 mg per day, Lescol 40 mg p.o. b.i.d., enteric coated aspirin 325 mg p.o. q day, Plavix 75 mg per day.  ACTIVITY:  No strenuous activity, no lifting more than 5 pounds or driving for two days.  DIET:  Low fat, low cholesterol, low salt diet.  DISCHARGE  INSTRUCTIONS:  The patient may shower but should not soak in the tub for a week.  He should call the office with any problems or questions.  He is to follow up with Dr. [name] on July 23, 1999 at 12:20.  Long term cardiac follow up will be with Dr. Damien Fusi in [place]. DD:  07/01/99 TD:  07/01/99 Job: [id number]

## 2010-05-28 NOTE — Cardiovascular Report (Signed)
[facility]. [facility]  Patient:    [name], [name]                       MRN: [id number] Proc. Date: 06/30/99 Adm. Date:  [id number] Disc. Date: [id number] Attending:  [name] CC:         Orpah Cobb, M.D., [place], [place]                        Cardiac Catheterization  PROCEDURES: 1. Left heart catheterization. 2. Coronary angiography. 3. Left ventriculogram. 4. LAD - proximal and mid - percutaneous transluminal coronary balloon    angioplasty.  Placement of intracoronary stent x 2.  COMPLICATIONS:  None.  INDICATIONS:  [name] is a 77 year old white male, patient of J. Masoud, M.D. in [place], [place]; referred for cardiac catheterization and possible PTCA after a positive stress Cardiolite.  He has a history of PTCA of his LAD in 1993 at [facility].  He underwent PTCA at that time, but has not been evaluated since then.  Recently he had an episode of paroxysmal atrial fibrillation, and Dr. Juel Burrow followed with a 2d echocardiogram as well as a stress Cardiolite.  The stress Cardiolite performed on Jun 10, 1999 revealed evidence of reversible inferior and anterior Win[place] ischemia, with an EF of 50%.  He is now referred for cardiac catheterization.  DESCRIPTION OF PROCEDURE:  After giving informed written consent, the patient was brought to the cardiac catheterization lab.  His right and left groins were shaved, prepped and draped in the usual sterile fashion.  His ECG monitoring was established.  Using modified Seldinger technique, a 6-French arterial sheath was inserted in the right femoral artery.  Six-French diagnostic catheters were then used to perform diagnostic angiography.  DIAGNOSTIC ANGIOGRAPHY: 1. Left Main Trunk:  Large and with no significant disease. 2. Left Anterior Descending:  A medium-sized vessel which coursed to the apex    and gave rise to three diagonal branches.  The LAD is noted to have a    diffusely  diseased 99% proximal LAD, with an area of high-grade stenosis    (up to 99%), and an area that appeared to have a cap from a chronic    dissection.  There was a mid 70% stenotic lesion, and then mild, diffuse    disease in the distal LAD.  The first, second and third diagonals were    small vessels, with no significant disease. 3. Left Circumflex:  A large vessel which coursed in the AV groove and gave    rise to two obtuse marginal branches.  The AV groove circumflex has no    significant disease.  The first and second obtuse marginals are    medium-sized vessels with no significant disease. 4. Right Coronary Artery:  A medium-sized vessel which is dominant.  It gives    rise to a PDA as well as a posterolateral branch.  The RCA is noted    to have a 60% mid vessel stenotic lesion.  There is a 30% late mid stenotic    lesion.  Both the PDA and the posterolateral branches have no significant    disease.  LEFT VENTRICULOGRAM:  Reveals preserved EF, calculated at 60%.  There is no significant mitral regurgitation.  HEMODYNAMICS: 1. Systemic arterial pressure:  125/68. 2. Left ventricular systemic pressure:  125/10. 3. Left ventricular end-diastolic pressure:  16.  INTERVENTIONAL PROCEDURE:  LAD --  Proximal and Mid --  Following  diagnostic angiography, a 7-French arterial sheath was inserted in the right femoral artery.  A 7-French JL4 guiding catheter was engaged into the left coronary ostium and selective coronary angiogram was performed.  Next, a 0.014 short Hi-Torque Floppy guide wire was advanced along the guiding catheter to the proximal LAD.  The guide wire was then used to cross into the distal LAD without difficulty.  Next, a CrossSail 2.5 x 20 mm balloon was then positioned over the proximal LAD stenotic lesion.  One subsequent inflation to a maximum of 10 atm was then performed for a total of 45 sec.  A follow-up angiogram revealed some luminal gain, however there is  persistence of irregularities with TIMI-3 flow to the distal vessel.  The balloon was then removed and a Tetra 2.75 x 18 mm coronary stent was then tracked into the proximal LAD stenotic lesion, with careful attention to position so as not to jail any diagonal branches.  The stent was then deployed to a maximum of 14 atm for a total of 55 sec.  A follow-up angiogram revealed no evidence of dissection or thrombus, with TIMI-3 flow to the distal vessel and less than 10% residual stenosis.  Our attention was then turned toward the 70% mid vessel stenotic lesion.  The stent balloon was then removed and a second Tetra 2.5 x 8 mm stent was then retracted into the mid LAD; was then deployed into the mid LAD stenotic lesion to approximately 16 atm for a total of one minute.  Follow-up angiogram revealed no evidence of dissection or thrombus, with TIMI-3 flow of the distal vessel.  Because of the complexity of the case, we elected to proceed with ReoPro administration.  The intravenous dose of heparin was given to maintain ACT within 200 and 300.  Final orthogonal angiograms revealed less than 10% residual stenosis in the proximal and mid LAD, with TIMI-3 flow to the distal vessel.  There was no evidence of dissection or thrombus.  At this point we elected to conclude the procedure.  All balloons, wires and catheters were removed.  Hemostatic sheath was sewn in place.  The patient was transferred back to the Ward in stable condition.  CONCLUSIONS: 1. Successful percutaneous transluminal coronary balloon angioplasty and    placement of a Tetra 2.75 x 18 mm coronary stent in the proximal LAD    stenotic lesion. 2. Successful primary stenting of the mid LAD stenotic lesion.  We used a    Tetra 2.5 x 8 mm coronary stent. 3. IGM ______ of ReoPro (abciximab) infusion. 4. Normal LV systolic function.  4. DD:  06/30/99 TD:  07/02/99 Job: [id number]

## 2010-05-28 NOTE — Cardiovascular Report (Signed)
NAME:  [name], [name] NO.:  [id number]   MEDICAL RECORD NO.:  [id number]                   PATIENT TYPE:  INP   LOCATION:  2022                                 FACILITY:  [facility]   PHYSICIAN:  [name], M.D.             DATE OF BIRTH:  [birth year]   DATE OF PROCEDURE:  11/12/2001  DATE OF DISCHARGE:                              CARDIAC CATHETERIZATION   PROCEDURES:  1. Left heart catheterization.  2. Coronary angiography.  3. Left ventriculogram.  4. Right coronary artery - mid:     a. Percutaneous transluminal coronary balloon angioplasty.     b. Placement of intracoronary stent.   COMPLICATIONS:  None.   INDICATIONS:  The patient is a 77 year old male, patient of Dr. Lenise Herald and Dr. [name] in [place] with a history of remote PTCA of his LAD  in 1990.  The patient had a repeat PTCA and stenting x2 of his LAD in June  of 2001.  His last Cardiolite in January of 2003 revealed mild inferior Carrizales  thinning with no significant ischemia.  He was readmitted on November 11, 2001, with atypical chest pain and subsequently ruled out for myocardial  infarction.  He is now brought for repeat catheterization.  The patient also  has a history of paroxysmal atrial fibrillation on chronic amiodarone  therapy.   DESCRIPTION OF PROCEDURE:  After given informed written consent, the patient  was brought to the cardiac catheterization lab where his right and left  groins were shaved, prepped, and draped in the usual sterile fashion.  ECG  monitoring was established.  Using modified Seldinger technique, a #6 French  arterial sheath was inserted in the right femoral artery.  The 6 French  diagnostic catheter was then used to perform diagnostic angiography.  This  revealed a large left main with no significant disease.   The LAD is a medium sized vessel which coursed to the apex and gave rise to  two diagonal branches.  The LAD is noted to have 50%  proximal narrowing.  There is a stent noted in the proximal portion of the LAD which appears  patent.  There is a stent in the midportion of the LAD which appears patent.  The first and second diagonal is a small vessel with no significant disease.   The left circumflex is a medium sized vessel which coursed in the AV groove  and gave rise to three obtuse marginal branches.  The AV groove circumflex  has no significant disease.  The first and second OMs are medium sized  vessels which bifurcate distally and has no significant disease.  The third  OM is a small vessel with no significant disease.   The right coronary artery is a large vessel which is dominant and gives rise  to both the PDA as well as the posterolateral branch.  The RCA is very  tortuous  with a shepherd's crook in the proximal portion with 30% proximal  lesion.  There is a long 70% mid vessel lesion.  There is sequential 40%  lesions in the distal portion of the RCA.  The PDA and posterolateral branch  has no significant disease.   LEFT VENTRICULOGRAM:  Left ventriculogram reveals mildly depressed EF at 40-  45% with mild global hypokinesis.   HEMODYNAMICS:  Systemic arterial pressure 114/68, LV systemic pressure  108/4, LVEDP of 10.   INTERVENTIONAL PROCEDURE:  RCA - mid:  Following diagnostic angiography, a  #6 [place] JR4 guiding catheter with side holes was then coaxially engaged in  the right coronary ostium.  We then attempted to cross the mid RCA stenotic  lesion with a 0.014 short Forte; however, we were unable to position this  into the distal portion of the RCA.  We then attempted to cross with a  Patriot guide wire; however, this was unsuccessful.  We ultimately were able  to cross with a exchange length Hi-Torque Floppy which was positioned in the  distal PDA.  Next, a 2.75 x 15 mm Maverick over-the-wire balloon was then  tracked into the distal portion of the RCA.  The floppy guide wire was then  exchanged  for an exchange length extra-support guide wire.  The balloon was  then pulled back across the stenotic lesion and one subsequent inflation to  10 atmospheres was performed for 37 seconds.  Followup angiogram revealed no  evidence of dissection or thrombosis.  We then were able to track a 3.0 x 13  mm Cypher stent into the mid RCA.  This stent was then deployed to a maximum  of 18 atmospheres for a total of 63 seconds.  Followup angiogram revealed no  evidence of dissection or thrombosis. However, this stent appeared to be un-  deployed in its mid segment.  We then took a 4.0 x 9 mm Maverick balloon and  did one subsequent inflation to 12 atmospheres for a total of 54 seconds  within the stent.  Followup angiogram revealed good stent expansion with  TIMI-3 flow to the distal vessel.  It should be noted this was a difficult  procedure with multiple exchanges of wire secondary to vessel tortuosity.  Final orthogonal angiograms reveal less than 10% residual stenosis in the  mid RCA stenotic lesion with TIMI-3 flow to the distal vessel.  IV  Integrilin was used throughout the case.  IV heparin was given to maintain  the ACT between 200 and 300.   Final orthogonal angiograms reveal approximately 10% residual stenosis in  the mid RCA stenotic lesion with TIMI-3 flow to the distal vessel.  At this  point, we elected to conclude the procedure.  All balloons, wires, and  catheters were removed. Hemostatic sheaths were sewn in place and the  patient was transferred back to the ward in stable condition.   CONCLUSION:  1. Successful percutaneous transluminal coronary balloon angioplasty and     placement of a Cypher 3.0 x 13 mm stent in the mid right coronary artery     stenotic lesion which was ultimately post-dilated to 4.25 mm.  2. Mildly depressed left ventricular systolic function.  3. Adjunct use of Integrilin infusion.  [name], M.D.    RHM/MEDQ  D:  11/12/2001  T:  11/13/2001  Job:  [id number]   cc:   [name], Dr.  Nicholes Rough, [place]

## 2010-05-28 NOTE — Discharge Summary (Signed)
NAME:  [name], [name] NO.:  [id number]   MEDICAL RECORD NO.:  [id number]                   PATIENT TYPE:  INP   LOCATION:  6531                                 FACILITY:  [facility]   PHYSICIAN:  [name], M.D.             DATE OF BIRTH:  1932/04/16   DATE OF ADMISSION:  04/09/2002  DATE OF DISCHARGE:  04/10/2002                                 DISCHARGE SUMMARY   DISCHARGE DIAGNOSES:  1. Paroxysmal atrial fibrillation, converted spontaneously to sinus rhythm     after admission.  2. History of past paroxysmal atrial fibrillation; the patient has been on     amiodarone 100 mg a day and aspirin.  3. Coronary disease, left anterior descending percutaneous coronary     intervention in 1993 at [facility] with left anterior descending stent, June of     2001, and right coronary artery stent, November of 2003.  4. Hypokalemia.  5. Mild left ventricular dysfunction with ejection fraction of 40-45%.  6. Treated hyperlipidemia.  7. Mild hypertension.   HOSPITAL COURSE:  The patient is a 77 year old male with history of coronary  disease and PAF.  He has been in sinus rhythm on low-dose amiodarone and  aspirin.  He present to the emergency room with irregular heart rate.  His  rate was not fast, it was around 80.  He tolerated this well.  He has not  had chest pain.  He was admitted to telemetry and started on IV heparin.  Shortly after admission, he converted spontaneously to sinus bradycardia.  We increased his amiodarone to 200 mg a day and his potassium was replaced.  He was monitored overnight and we feel he can be discharged April 10, 2002.  We did discuss Coumadin and aspirin and Plavix with him.  He has been  somewhat particular about his medications in the past and we told him it was  okay to discuss this with Dr. [name] in followup.  We also suggested he  should be on an ACE inhibitor; he can also discuss this with Dr. [name].   DISCHARGE  MEDICATIONS:  1. Coated aspirin once a day.  2. Amiodarone 200 mg a day.  3. Potassium 10 mEq a day.  4. Hydrochlorothiazide 12.5 daily.  5. Lipitor 20 mg a day.   LABORATORY DATA:  Hemoglobin A1c was 5.6.  Sodium 138, potassium on  admission was 3.3, BUN 19, creatinine 1.2.  LFTs are normal.  CK-MB and  troponins are negative.  White count 5.3, hemoglobin 14.7, hematocrit 43,  platelets 185,000.  TSH is within normal limits.   EKG in sinus rhythm shows sinus bradycardia, heart rate of 52, a QTc of 413.   DISPOSITION:  Patient is discharged in stable condition and will follow up  with Dr. [name].     Abelino Derrick, P.A.  [name], M.D.    [name]  D:  04/10/2002  T:  04/11/2002  Job:  [id number]

## 2010-05-28 NOTE — Discharge Summary (Signed)
NAME:  [name], [name] NO.:  [id number]   MEDICAL RECORD NO.:  [id number]                   PATIENT TYPE:  INP   LOCATION:  6529                                 FACILITY:  [facility]   PHYSICIAN:  [name], M.D.             DATE OF BIRTH:  07/28/1932   DATE OF ADMISSION:  11/11/2001  DATE OF DISCHARGE:  11/13/2001                                 DISCHARGE SUMMARY   PRIMARY CARE PHYSICIAN:  Dr. Thurmond [name].   ADMISSION DIAGNOSES:  1. Unstable angina.  2. Coronary artery disease     a. Status post percutaneous coronary angiography and stent to the left        anterior descending coronary artery in 1990.     b. Status post percutaneous coronary angiography and stent to the left        anterior descending coronary artery x2 in June 2001.     c. Cardiolite in January  2003, showing no significant abnormality.  3. Paroxysmal atrial fibrillation, currently maintaining sinus rhythm with     amiodarone.  4. Hyperlipidemia.  5. Erectile dysfunction.  6. Bradycardia.   DISCHARGE DIAGNOSES:  1. Unstable angina.  2. Coronary artery disease     a. Status post percutaneous coronary angiography and stent to the left        anterior descending coronary artery in 1990.     b. Status post percutaneous coronary angiography and stent to the left        anterior descending coronary artery x2 in June 2001.     c. Cardiolite in January  2003, showing no significant abnormality.  3. Paroxysmal atrial fibrillation, currently maintaining sinus rhythm with     amiodarone.  4. Hyperlipidemia.  5. Erectile dysfunction.  6. Bradycardia.  7. A cardiac catheterization on November 12, 2001, by Dr. Darlin [name]     with a percutaneous coronary angiography and using a Cypher stent to the     mid-right coronary artery.  Ejection fraction of 40%-45%, with mild     global hypokinesis.   HISTORY OF PRESENT ILLNESS:  The patient is a pleasant 77 year old white  married male  with a history of coronary artery disease, status post  percutaneous coronary angiography to the LAD in 1990, and a subsequent  percutaneous coronary angiography and stent to the LAD x2 in June 2001.  He  underwent a Cardiolite in January  2003, that was okay.  He was in his usual  state of health until the day of admission when he developed mid-sternal  chest pain that lasted for one or two hours.  There were no associated  symptoms.  As well, he had recently been feeling fatigued and dizzy, and saw Dr. Thurmond [name],  and labs were drawn.  Carotid Dopplers were performed.  As well, he had been  experiencing some left shoulder and back pain associated with movement.  At  the time of our evaluation, he was pain-free.  An electrocardiogram showed  sinus bradycardia with no ST-T change.  He had used Viagra within 24 hours,  so it was noted not to use any nitroglycerin at that point.  At that time we planned to place him on IV heparin and continue his current  medicines, including aspirin.  Check serial enzymes to rule out a myocardial  infarction.  We would have Dr. Jenne [name] see him in the morning and decide for  the need for cardiac catheterization.   HOSPITAL COURSE:  On November 12, 2001, the patient remained stable with no  further complaints.  The enzymes remained negative.  He was seen and  evaluated by Dr. Jenne [name], who felt that he needed to proceed with a cardiac  catheterization.  On November 12, 2001, the patient underwent a cardiac  catheterization by Dr. Jenne [name].  Please see the dictated report for details.  The left main was very diseased.  The left circumflex and its branches were  very diseased.  The LAD had a proximal 50% stenosis.  The two previously-  placed stents in the LAD had no significant restenosis.  The RCA had a 30%  proximal stenosis and a 70% mid-stenosis.  This is followed by more distal  40% stenosis x2.  Dr. Jenne [name] then proceeded with a percutaneous coronary   angiography using a Maverick balloon.  He then proceeded with stenting using  a Cypher stent.  This was performed to the mid-RCA and went from a 70%  stenosis to a 10% residual.  The ejection fraction was estimated at 40%-45%  with mild global hypokinesis.  He tolerated the procedure well and had no  complication.  He planned for the sheath out when the ACT was less than 175.  Continue on Tequin for 18 hours and then discontinue it.  Plan to continue  Plavix for three months.  On November 13, 2001, the patient remained stable.  He feels well with no  chest pain or shortness of breath.  He is completing his Integrilin infusion  at this time.  His groin sites are well-healed without hematoma, and no  bleed, with minimal ecchymosis.  He is afebrile with pulse of 55, blood  pressure 105/55.  The remainder of his examination is stable.  His labs are  stable.   DISPOSITION:  At this time he is being evaluated by Dr. Jenne [name], who deems  him stable for discharge home.   CONSULTATIONS:  None.   PROCEDURE:  1. Cardiac catheterization on November 12, 2001, by Dr. Jenne [name].  Again, see     his dictated report for details.  The left main appears free of disease.     The left circumflex and its branches appear free of disease.  The     proximal LAD has a 50% stenosis.  The two previously-placed stents have     no significant restenosis.  The RCA has a proximal 30% stenosis.  The mid-     RCA has a 70% stenosis.  This is followed by two distal lesions of 40%     each.  The ejection fraction is estimated at 40%-45% with mild global     hypokinesis.  2. Dr. Jenne [name] then performed a percutaneous coronary angiography using a     Maverick balloon.  He then used a Cypher stent.  Both of these were     placed to the mid-RCA, and this went from 70% to 10% residual.  He  tolerated the procedure well and had no complications.  He planned the    sheaths out when the ACT was less than 175.  Continue on Tequin  for 18     hours, and then discontinue.  Plavix for three months.   LABORATORY DATA:  Cardiac enzymes negative with CK of 76 and 56, MB 1.4 and  1.1, troponin less than 0.01, less than 0.01.  On admission white count 7.6,  hemoglobin 14.4, hematocrit 41.7, platelets 197.  Sodium 136, potassium 4.1,  BUN 14, creatinine 1.1, glucose 109.  Discharge white count 5.5, hemoglobin  12.4, hematocrit 37.8, platelets 188.  Sodium 137, potassium 3.7, BUN 11,  creatinine 1.1, glucose 111.  ELECTROCARDIOGRAM:  Showed sinus bradycardia 53 beats per minute.  No ST-T  change on admission.   DISCHARGE MEDICATIONS:  1. Plavix 75 mg once q.d. for three months.  2. Enteric-coated aspirin 325 mg once q.d.  3. Amiodarone 100 mg once q.d.  4. Lipitor 20 mg once q.d.  5. HCTZ 12.5 mg once q.d.  6. Fish oil capsules as before.  7. Viagra as directed.  8. Nitroglycerin as directed.   ACTIVITY:  No strenuous activity.  No lifting of greater than 5 pounds,  driving, or sexual activity for three days.   DIET:  A low-salt, low-fat, low-cholesterol diet.   INSTRUCTIONS:  Call [phone] if any bleeding or increased signs of pain at  the groin site.   WOUND CARE:  May gently wash the groin site with warm water and soap.   FOLLOW UP:  Follow up with Abelino Derrick, P.A.-C. For Dr. Jenne [name] on  November 28, 2001, at 9:30 a.m.      Mary B. Easley, P.A.-C.                   [name], M.D.    MBE/MEDQ  D:  11/13/2001  T:  11/14/2001  Job:  [id number]   cc:   Dr. Thurmond [name]

## 2010-06-11 ENCOUNTER — Ambulatory Visit (INDEPENDENT_AMBULATORY_CARE_PROVIDER_SITE_OTHER): Payer: BC Managed Care – PPO | Admitting: Cardiovascular Disease

## 2010-06-11 ENCOUNTER — Encounter: Payer: Self-pay | Admitting: Cardiovascular Disease

## 2010-06-11 DIAGNOSIS — I2510 Atherosclerotic heart disease of native coronary artery without angina pectoris: Secondary | ICD-10-CM

## 2010-06-11 DIAGNOSIS — I4891 Unspecified atrial fibrillation: Secondary | ICD-10-CM

## 2010-06-11 DIAGNOSIS — I10 Essential (primary) hypertension: Secondary | ICD-10-CM

## 2010-06-11 DIAGNOSIS — E785 Hyperlipidemia, unspecified: Secondary | ICD-10-CM

## 2010-06-11 MED ORDER — EZETIMIBE-SIMVASTATIN 10-40 MG PO TABS: 1.0000 | ORAL_TABLET | Freq: Every day | ORAL | Status: DC

## 2010-06-11 MED ORDER — AMIODARONE HCL 100 MG PO TABS: 100.0000 mg | ORAL_TABLET | Freq: Every day | ORAL | Status: DC

## 2010-06-11 NOTE — Patient Instructions (Addendum)
Please decrease the amiodarone to a 1/2 pill for one month, then stop if no palpitations You are doing well. Your physician recommends that you return for a FASTING lipid profile: 3 months (Lipid/Lft) Please call us if you have new issues that need to be addressed before your next appt.  We will call you for a follow up Appt. In 12 months

## 2010-06-11 NOTE — Progress Notes (Signed)
   Patient ID: [name], male    DOB: 04-Nov-1932, 77 y.o.   MRN: [id number]  HPI Comments: [name] is a very pleasant 77 year old gentleman with a history of coronary artery disease, stent placed to his LAD in 1993, stent placed to his mid RCA in 2003, in-stent restenosis with repeat stent placed to his LAD in September 08, stent placed to his RCA at the same time, stress test in February 2009 which showed no ischemia, also with history of atrial fibrillation per, maintained on low dose amiodarone who presents 4 routine followup   Overall he states that he is doing well. He is not exercising like he should. [name] He denies any shortness of breath or chest pain. He is tolerating his medications well.He takes care of 2 cats that are diabetic. Also has been taking care of baby rabbits in his backyard. He reports that his weight has been trending downward. He has been cutting his Vytorin and half.  Last labs in March 2012 showed total cholesterol 141, LDL 77, HDL 48. He had decreased his Vytorin and half one week prior to this lab draw.  EKG shows sinus bradycardia with rate 51 beats per minute, no significant ST or T wave changes, first degree AV block noted     Review of Systems  Constitutional: Negative.   HENT: Negative.   Eyes: Negative.   Respiratory: Negative.   Cardiovascular: Negative.   Gastrointestinal: Negative.   Musculoskeletal: Negative.   Skin: Negative.   Neurological: Negative.   Hematological: Negative.   Psychiatric/Behavioral: Negative.   All other systems reviewed and are negative.    BP 100/68  Pulse 50  Ht 6\' 1"  (1.854 m)  Wt 163 lb (73.936 kg)  BMI 21.51 kg/m2   Physical Exam  Nursing note and vitals reviewed. Constitutional: He is oriented to person, place, and time. He appears well-developed and well-nourished.  HENT:  Head: Normocephalic.  Nose: Nose normal.  Mouth/Throat: Oropharynx is clear and moist.  Eyes: Conjunctivae are normal. Pupils are  equal, round, and reactive to light.  Neck: Normal range of motion. Neck supple. No JVD present.  Cardiovascular: Normal rate, regular rhythm, S1 normal, S2 normal, normal heart sounds and intact distal pulses.  Exam reveals no gallop and no friction rub.   No murmur heard. Pulmonary/Chest: Effort normal and breath sounds normal. No respiratory distress. He has no wheezes. He has no rales. He exhibits no tenderness.  Abdominal: Soft. Bowel sounds are normal. He exhibits no distension. There is no tenderness.  Musculoskeletal: Normal range of motion. He exhibits no edema and no tenderness.  Lymphadenopathy:    He has no cervical adenopathy.  Neurological: He is alert and oriented to person, place, and time. Coordination normal.  Skin: Skin is warm and dry. No rash noted. No erythema.  Psychiatric: He has a normal mood and affect. His behavior is normal. Judgment and thought content normal.           Assessment and Plan

## 2010-06-12 NOTE — Assessment & Plan Note (Addendum)
Blood pressure is borderline low. He does not have any symptoms at this time. I suggested that if his blood pressure continues to be low, that he document this and hold his ARB.

## 2010-06-12 NOTE — Assessment & Plan Note (Signed)
We have suggested he stay on the Vytorin one half dose will check his cholesterol in 3 months time to ensure that he is at goal with LDL less than 70

## 2010-06-12 NOTE — Assessment & Plan Note (Signed)
Currently with no symptoms of angina. No further workup at this time. Continue current medication regimen. 

## 2010-06-12 NOTE — Assessment & Plan Note (Signed)
Maintaining sinus rhythm. We will decrease his amiodarone in half.

## 2010-07-01 ENCOUNTER — Encounter: Payer: Self-pay | Admitting: Cardiovascular Disease

## 2010-07-13 ENCOUNTER — Emergency Department: Payer: Medicare Other | Admitting: Unknown Physician Specialty

## 2010-09-20 ENCOUNTER — Ambulatory Visit (INDEPENDENT_AMBULATORY_CARE_PROVIDER_SITE_OTHER): Payer: BC Managed Care – PPO | Admitting: Cardiovascular Disease

## 2010-09-20 ENCOUNTER — Encounter: Payer: Self-pay | Admitting: Cardiovascular Disease

## 2010-09-20 DIAGNOSIS — I4891 Unspecified atrial fibrillation: Secondary | ICD-10-CM

## 2010-09-20 DIAGNOSIS — I2510 Atherosclerotic heart disease of native coronary artery without angina pectoris: Secondary | ICD-10-CM

## 2010-09-20 DIAGNOSIS — E785 Hyperlipidemia, unspecified: Secondary | ICD-10-CM

## 2010-09-20 DIAGNOSIS — I10 Essential (primary) hypertension: Secondary | ICD-10-CM

## 2010-09-20 NOTE — Patient Instructions (Addendum)
You are doing well. Please hold amiodarone. Take only for episodes of atrial fib.  Try to stop the micardis and monitor blood pressure Goal total cholesterol <150, goal LDL <70 Please call us if you have new issues that need to be addressed before your next appt.  We will call you for a follow up Appt. In 12 months

## 2010-09-20 NOTE — Assessment & Plan Note (Signed)
Currently with no symptoms of angina. No further workup at this time. Continue current medication regimen. 

## 2010-09-20 NOTE — Progress Notes (Signed)
Patient ID: [name], male    DOB: 01/14/1932, 77 y.o.   MRN: [id number]  HPI Comments: [name] is a very pleasant 77 year old gentleman with a history of coronary artery disease, stent placed to his LAD in 1993, stent placed to his mid RCA in 2003, in-stent restenosis with repeat stent placed to his LAD in September 08, stent placed to his RCA at the same time, stress test in February 2009 which showed no ischemia, also with history of atrial fibrillation Over 10 years ago, maintained on low dose amiodarone who presents for routine followup.  He reports that he was weaning himself off his amiodarone though scared himself as his blood pressure if him abnormal readings and he put himself back on low-dose amiodarone. We had also discussed weaning him off his Micardis and he has not done this yet and he was afraid of what his blood pressure cuff at read. He otherwise feels well. He is active. He does have poor sleep as his cats wake him up at night time. One of his cats is diabetic. He has not been using his treadmill on a regular basis.   He denies any shortness of breath or chest pain. He is tolerating his medications well.  His weight has been trending downward. He has been cutting his Vytorin in half.  Last labs in March 2012 showed total cholesterol 141, LDL 77, HDL 48. He had decreased his Vytorin and half one week prior to this lab draw. Cholesterol has not been checked. Since he cut his Vytorin to the lower dose  EKG shows sinus bradycardia with rate 56 beats per minute, no significant ST or T wave changes, first degree AV block    Outpatient Encounter Prescriptions as of 09/20/2010  Medication Sig Dispense Refill  . amiodarone (PACERONE) 100 MG tablet Take 50 mg by mouth daily.       [name] aspirin 81 MG EC tablet Take 81 mg by mouth daily.        . clonazePAM (KLONOPIN) 0.5 MG tablet Take 0.25 mg by mouth daily.       . clopidogrel (PLAVIX) 75 MG tablet Take 75 mg by mouth daily.          [name] ezetimibe-simvastatin (VYTORIN) 10-40 MG per tablet Take 0.5 tablets by mouth at bedtime.        . nitroGLYCERIN (NITROSTAT) 0.4 MG SL tablet Place 0.4 mg under the tongue every 5 (five) minutes as needed.        . polyethylene glycol (MIRALAX / GLYCOLAX) packet Take 17 g by mouth daily.        [name] telmisartan (MICARDIS) 20 MG tablet 20 mg. Take 1/2 tablet daily.          Review of Systems  Constitutional: Negative.   HENT: Negative.   Eyes: Negative.   Respiratory: Negative.   Cardiovascular: Negative.   Gastrointestinal: Negative.   Musculoskeletal: Negative.   Skin: Negative.   Neurological: Negative.   Hematological: Negative.   Psychiatric/Behavioral: Positive for sleep disturbance and dysphoric mood.  All other systems reviewed and are negative.    BP 131/79  Pulse 56  Ht 6\' 1"  (1.854 m)  Wt 164 lb (74.39 kg)  BMI 21.64 kg/m2   Physical Exam  Nursing note and vitals reviewed. Constitutional: He is oriented to person, place, and time. He appears well-developed and well-nourished.  HENT:  Head: Normocephalic.  Nose: Nose normal.  Mouth/Throat: Oropharynx is clear and moist.  Eyes: Conjunctivae are  normal. Pupils are equal, round, and reactive to light.  Neck: Normal range of motion. Neck supple. No JVD present.  Cardiovascular: Normal rate, regular rhythm, S1 normal, S2 normal, normal heart sounds and intact distal pulses.  Exam reveals no gallop and no friction rub.   No murmur heard. Pulmonary/Chest: Effort normal and breath sounds normal. No respiratory distress. He has no wheezes. He has no rales. He exhibits no tenderness.  Abdominal: Soft. Bowel sounds are normal. He exhibits no distension. There is no tenderness.  Musculoskeletal: Normal range of motion. He exhibits no edema and no tenderness.  Lymphadenopathy:    He has no cervical adenopathy.  Neurological: He is alert and oriented to person, place, and time. Coordination normal.  Skin: Skin is warm and  dry. No rash noted. No erythema.  Psychiatric: He has a normal mood and affect. His behavior is normal. Judgment and thought content normal.           Assessment and Plan

## 2010-09-20 NOTE — Assessment & Plan Note (Signed)
He is currently in normal sinus rhythm. He has not had any episodes of atrial fibrillation in a decade. I suggested he hold his amiodarone. He could take amiodarone p.r.n. If he does convert to atrial fibrillation.

## 2010-09-20 NOTE — Assessment & Plan Note (Signed)
I suggested he have his cholesterol checked on the half dose Vytorin to ensure that he is at goal with LDL less than 70. He reports having recent blood work done and will try to provide [place] a copy.

## 2010-09-20 NOTE — Assessment & Plan Note (Signed)
Blood pressure is well controlled on today's visit. I suggested that if he would like, he could try to wean himself off the Micardis 10 mg daily, with close monitoring of his blood pressure.

## 2010-10-05 ENCOUNTER — Ambulatory Visit (INDEPENDENT_AMBULATORY_CARE_PROVIDER_SITE_OTHER): Payer: BC Managed Care – PPO | Admitting: Internal Medicine

## 2010-10-05 ENCOUNTER — Encounter: Payer: Self-pay | Admitting: Internal Medicine

## 2010-10-05 DIAGNOSIS — Z23 Encounter for immunization: Secondary | ICD-10-CM

## 2010-10-05 DIAGNOSIS — I10 Essential (primary) hypertension: Secondary | ICD-10-CM

## 2010-10-05 DIAGNOSIS — E785 Hyperlipidemia, unspecified: Secondary | ICD-10-CM

## 2010-10-05 DIAGNOSIS — R079 Chest pain, unspecified: Secondary | ICD-10-CM

## 2010-10-05 DIAGNOSIS — I7090 Unspecified atherosclerosis: Secondary | ICD-10-CM

## 2010-10-05 DIAGNOSIS — G4700 Insomnia, unspecified: Secondary | ICD-10-CM | POA: Insufficient documentation

## 2010-10-05 MED ORDER — CLONAZEPAM 0.5 MG PO TABS: 0.5000 mg | ORAL_TABLET | Freq: Every day | ORAL | Status: DC

## 2010-10-05 MED ORDER — CLOPIDOGREL BISULFATE 75 MG PO TABS: 75.0000 mg | ORAL_TABLET | Freq: Every day | ORAL | Status: DC

## 2010-10-05 NOTE — Progress Notes (Signed)
Subjective:    Patient ID: [name], male    DOB: 1932-06-28, 77 y.o.   MRN: [id number]  HPI 77YO male with h/o CAD presents to establish care. Complains of mid-sternal chest discomfort which does not feel like his previous anginal symptoms. Reports he was mowing a lawn which includes some hills over 5 acres. 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 Denies dyspnea, diaphoresis, palpitations. Pain only occurs with movement.  Pain alleviated after taking Aleve last night.  Otherwise, feeling well.  Outpatient Encounter Prescriptions as of 10/05/2010  Medication Sig Dispense Refill  . aspirin 81 MG EC tablet Take 81 mg by mouth daily.        . clonazePAM (KLONOPIN) 0.5 MG tablet Take 1 tablet (0.5 mg total) by mouth daily.  30 tablet  3  . clopidogrel (PLAVIX) 75 MG tablet Take 1 tablet (75 mg total) by mouth daily.  30 tablet  11  . ezetimibe-simvastatin (VYTORIN) 10-40 MG per tablet Take 0.5 tablets by mouth at bedtime.        . nitroGLYCERIN (NITROSTAT) 0.4 MG SL tablet Place 0.4 mg under the tongue every 5 (five) minutes as needed.        . polyethylene glycol (MIRALAX / GLYCOLAX) packet Take 17 g by mouth daily.        [name] telmisartan (MICARDIS) 20 MG tablet 20 mg. Take 1/2 tablet daily.         Review of Systems  Constitutional: Negative for fever, chills, activity change, appetite change, fatigue and unexpected weight change.  Eyes: Negative for visual disturbance.  Respiratory: Negative for cough and shortness of breath.   Cardiovascular: Positive for chest pain. Negative for palpitations and leg swelling.  Gastrointestinal: Negative for abdominal pain and abdominal distention.  Genitourinary: Negative for dysuria, urgency and difficulty urinating.  Musculoskeletal: Negative for arthralgias and gait problem.  Skin: Negative for color change and rash.  Hematological: Negative for adenopathy.  Psychiatric/Behavioral: Negative for sleep disturbance and dysphoric  mood. The patient is not nervous/anxious.    BP 146/76  Pulse 53  Temp(Src) 97.6 F (36.4 C) (Oral)  Resp 16  Ht 6' 1.5" (1.867 m)  Wt 167 lb (75.751 kg)  BMI 21.73 kg/m2  SpO2 99%     Objective:   Physical Exam  Constitutional: He is oriented to person, place, and time. He appears well-developed and well-nourished. No distress.  HENT:  Head: Normocephalic and atraumatic.  Right Ear: Tympanic membrane, external ear and ear canal normal.  Left Ear: Tympanic membrane, external ear and ear canal normal.  Nose: Nose normal.  Mouth/Throat: Oropharynx is clear and moist. No oropharyngeal exudate.  Eyes: Conjunctivae and EOM are normal. Pupils are equal, round, and reactive to light. Right eye exhibits no discharge. Left eye exhibits no discharge. No scleral icterus.  Neck: Normal range of motion. Neck supple. Carotid bruit is not present. No tracheal deviation present. No thyromegaly present.  Cardiovascular: Normal rate, regular rhythm and normal heart sounds.  Exam reveals no gallop and no friction rub.   No murmur heard. Pulmonary/Chest: Effort normal and breath sounds normal. No respiratory distress. He has no wheezes. He has no rales. He exhibits no tenderness.  Abdominal: Soft. Bowel sounds are normal. He exhibits no distension and no mass. There is no tenderness. There is no rebound and no guarding.  Musculoskeletal: Normal range of motion. He exhibits no edema.  Lymphadenopathy:    He has no cervical adenopathy.  Neurological: He is  alert and oriented to person, place, and time. No cranial nerve deficit. Coordination normal.  Skin: Skin is warm and dry. No rash noted. He is not diaphoretic. No erythema. No pallor.  Psychiatric: He has a normal mood and affect. His behavior is normal. Judgment and thought content normal.          Assessment & Plan:  1. Chest pain - Appears to be related to muscular injury after mowing using a push mower. Continue prn Aleve. RTC if symptoms  worsen or persist.  2. Insomnia - Doing well using prn clonazepam. Will continue.  3. Atherosclerosis - Refill given on Plavix today.

## 2010-10-05 NOTE — Patient Instructions (Signed)
Labs next week. Follow up in 6 months.

## 2010-10-19 ENCOUNTER — Encounter: Payer: Self-pay | Admitting: Internal Medicine

## 2010-10-22 LAB — CBC
HCT: 35.8 — ABNORMAL LOW
Hemoglobin: 11.7 — ABNORMAL LOW
Hemoglobin: 11.8 — ABNORMAL LOW
Hemoglobin: 11.9 — ABNORMAL LOW
Hemoglobin: 12.6 — ABNORMAL LOW
MCHC: 33.5
MCHC: 33.7
MCHC: 33.7
MCV: 85.9
MCV: 86.1
MCV: 86.4
MCV: 86.5
RBC: 4.01 — ABNORMAL LOW
RBC: 4.12 — ABNORMAL LOW
RBC: 4.17 — ABNORMAL LOW
RBC: 4.34
RDW: 13.2
RDW: 13.4
RDW: 13.6
WBC: 8.5

## 2010-10-22 LAB — COMPREHENSIVE METABOLIC PANEL
ALT: 32
AST: 28
Albumin: 3.4 — ABNORMAL LOW
Alkaline Phosphatase: 68
BUN: 17
CO2: 25
Calcium: 8.5
Chloride: 105
Creatinine, Ser: 1.24
GFR calc Af Amer: >60
GFR calc non Af Amer: 57 — ABNORMAL LOW
Glucose, Bld: 112 — ABNORMAL HIGH
Potassium: 4.2
Sodium: 137
Total Bilirubin: 0.9
Total Protein: 6

## 2010-10-22 LAB — BASIC METABOLIC PANEL
CO2: 24
Calcium: 8.5
Calcium: 8.7
Creatinine, Ser: 1.22
Creatinine, Ser: 1.23
GFR calc Af Amer: >60
GFR calc Af Amer: >60
GFR calc non Af Amer: 58 — ABNORMAL LOW
GFR calc non Af Amer: 59 — ABNORMAL LOW
Glucose, Bld: 156 — ABNORMAL HIGH
Sodium: 138
Sodium: 138

## 2010-10-22 LAB — CARDIAC PANEL(CRET KIN+CKTOT+MB+TROPI)
CK, MB: 1.4
CK, MB: 1.8
Relative Index: INVALID
Relative Index: INVALID
Relative Index: INVALID
Relative Index: INVALID
Relative Index: INVALID
Total CK: 49
Total CK: 54
Total CK: 62
Total CK: 76
Total CK: 79
Troponin I: 0.04
Troponin I: 0.04
Troponin I: 0.08 — ABNORMAL HIGH

## 2010-10-22 LAB — HEPARIN LEVEL (UNFRACTIONATED)
Heparin Unfractionated: 0.39
Heparin Unfractionated: 0.67

## 2010-10-22 LAB — MAGNESIUM: Magnesium: 2.2

## 2010-10-22 LAB — PROTIME-INR
INR: 1.1
Prothrombin Time: 14.5

## 2010-10-27 ENCOUNTER — Telehealth: Payer: Self-pay | Admitting: Internal Medicine

## 2010-10-27 NOTE — Telephone Encounter (Signed)
I have no information on this person no phone number or address.

## 2010-10-27 NOTE — Telephone Encounter (Signed)
Message copied by Virgina Evener on Wed Oct 27, 2010 10:51 AM ------      Message from: Ronna Polio A      Created: Mon Oct 18, 2010  7:58 AM      Regarding: labs       I received labs on this patient which were abnormal. I have never seen him before.  Please make sure he has an appointment or has another PCP.

## 2010-10-27 NOTE — Telephone Encounter (Signed)
Can you contact [facility] and make sure they contact him

## 2010-10-29 NOTE — Telephone Encounter (Signed)
Front office is helping search for labs

## 2010-11-19 ENCOUNTER — Ambulatory Visit: Payer: BC Managed Care – PPO | Admitting: Internal Medicine

## 2010-11-22 ENCOUNTER — Encounter: Payer: Self-pay | Admitting: Internal Medicine

## 2010-11-22 ENCOUNTER — Ambulatory Visit (INDEPENDENT_AMBULATORY_CARE_PROVIDER_SITE_OTHER): Payer: BC Managed Care – PPO | Admitting: Internal Medicine

## 2010-11-22 DIAGNOSIS — T148XXA Other injury of unspecified body region, initial encounter: Secondary | ICD-10-CM

## 2010-11-22 DIAGNOSIS — IMO0002 Reserved for concepts with insufficient information to code with codable children: Secondary | ICD-10-CM

## 2010-11-22 DIAGNOSIS — I10 Essential (primary) hypertension: Secondary | ICD-10-CM

## 2010-11-22 DIAGNOSIS — N644 Mastodynia: Secondary | ICD-10-CM

## 2010-11-22 DIAGNOSIS — IMO0001 Reserved for inherently not codable concepts without codable children: Secondary | ICD-10-CM

## 2010-11-22 MED ORDER — AMOXICILLIN-POT CLAVULANATE 875-125 MG PO TABS: 1.0000 | ORAL_TABLET | Freq: Two times a day (BID) | ORAL | Status: AC

## 2010-11-22 MED ORDER — TELMISARTAN 20 MG PO TABS: 10.0000 mg | ORAL_TABLET | Freq: Every day | ORAL | Status: DC

## 2010-11-22 NOTE — Progress Notes (Signed)
Subjective:    Patient ID: [name], male    DOB: Oct 12, 1932, 77 y.o.   MRN: [id number]  HPI 77YO male presents for acute visit c/o bilateral nipple pain and cat bite on right arm.  Notes cat bite occurred approximately 2-3 weeks ago.  Had central laceration with surrounding redness which has improved over last weeks. No fever or chills. No noted enlarged LAD. Bilateral nipple pain began around same time. No nipple discharge noted.  No breast mass noted. No enlargement of breasts.  Pt denies use of any new meds/herbals.  No fatigue or other symptoms.   Review of Systems  Constitutional: Negative for fever, chills, activity change, appetite change, fatigue and unexpected weight change.  Eyes: Negative for visual disturbance.  Respiratory: Negative for cough and shortness of breath.   Cardiovascular: Positive for chest pain (bilateral nipples). Negative for palpitations and leg swelling.  Gastrointestinal: Negative for abdominal pain and abdominal distention.  Genitourinary: Negative for dysuria, urgency and difficulty urinating.  Musculoskeletal: Negative for arthralgias and gait problem.  Skin: Positive for wound. Negative for color change and rash.  Hematological: Negative for adenopathy.  Psychiatric/Behavioral: Negative for sleep disturbance and dysphoric mood. The patient is not nervous/anxious.        Objective:   Physical Exam  Constitutional: He is oriented to person, place, and time. He appears well-developed and well-nourished. No distress.  HENT:  Head: Normocephalic and atraumatic.  Right Ear: External ear normal.  Left Ear: External ear normal.  Nose: Nose normal.  Mouth/Throat: Oropharynx is clear and moist. No oropharyngeal exudate.  Eyes: Conjunctivae and EOM are normal. Pupils are equal, round, and reactive to light. Right eye exhibits no discharge. Left eye exhibits no discharge. No scleral icterus.  Neck: Normal range of motion. Neck supple. No tracheal  deviation present. No thyromegaly present.  Cardiovascular: Normal rate, regular rhythm and normal heart sounds.  Exam reveals no gallop and no friction rub.   No murmur heard. Pulmonary/Chest: Effort normal and breath sounds normal. No respiratory distress. He has no wheezes. He has no rales. He exhibits no tenderness. Right breast exhibits no inverted nipple, no mass, no nipple discharge, no skin change and no tenderness. Left breast exhibits no inverted nipple, no mass, no nipple discharge, no skin change and no tenderness.  Musculoskeletal: Normal range of motion. He exhibits no edema.  Lymphadenopathy:    He has no cervical adenopathy.    He has no axillary adenopathy.       Right axillary: No pectoral adenopathy present.       Left axillary: No pectoral adenopathy present.      Right: No supraclavicular adenopathy present.       Left: No supraclavicular adenopathy present.  Neurological: He is alert and oriented to person, place, and time. No cranial nerve deficit. Coordination normal.  Skin: Skin is warm and dry. No rash noted. He is not diaphoretic. No erythema. No pallor.     Psychiatric: He has a normal mood and affect. His behavior is normal. Judgment and thought content normal.          Assessment & Plan:  1. Cat bite - Given persistence of surrounding erythema, will treat empirically with Augmentin x 2 weeks.  Pt will call if symptoms not improving.  2. Breast pain - Normal exam. No palpable masses, tenderness, galactorrhea.  Will check basic blood work, including TSH, CBC, CMP, testosterone.  Given that symptoms coincided with cat bite, question if this may somehow be  related. Will follow up 2-4 weeks.

## 2010-11-23 ENCOUNTER — Telehealth: Payer: Self-pay | Admitting: Internal Medicine

## 2010-11-23 NOTE — Telephone Encounter (Signed)
Noted, will look keep eye out for labs.

## 2010-11-23 NOTE — Telephone Encounter (Signed)
Pt was at [facility] with you called him today getting his labs done.  You should get the results tomorrow.  If there something is something missing call pt he will come here to do the rest of the labs/  Lab tech didn't takeenough blood

## 2010-11-23 NOTE — Telephone Encounter (Signed)
Left detailed VM on pt's hm # 

## 2010-11-23 NOTE — Telephone Encounter (Signed)
Labs from 10/2010 show slightly low kidney function.  Pt had orders to repeat labs yesterday, and this will be helpful to see if kidney function stable.  Vit D was also slightly low. Would recommend 1000units Vit D daily.  Please ask him to make sure that they fax a copy of new labs.

## 2010-11-25 ENCOUNTER — Telehealth: Payer: Self-pay | Admitting: Internal Medicine

## 2010-11-25 NOTE — Telephone Encounter (Signed)
Labs show that renal function is improved compared to previous. Liver function normal. Cholesterol excellent. Testosterone slightly low. We can discuss this at follow up visit.

## 2010-11-26 NOTE — Telephone Encounter (Signed)
LMOM to inform patient. 

## 2010-12-01 ENCOUNTER — Encounter: Payer: Self-pay | Admitting: Internal Medicine

## 2010-12-22 ENCOUNTER — Ambulatory Visit (INDEPENDENT_AMBULATORY_CARE_PROVIDER_SITE_OTHER): Payer: BC Managed Care – PPO | Admitting: Internal Medicine

## 2010-12-22 ENCOUNTER — Encounter: Payer: Self-pay | Admitting: Internal Medicine

## 2010-12-22 VITALS — BP 132/70 | HR 65 | Temp 97.6°F | Wt 164.0 lb

## 2010-12-22 DIAGNOSIS — N644 Mastodynia: Secondary | ICD-10-CM

## 2010-12-22 NOTE — Progress Notes (Signed)
Subjective:    Patient ID: [name], male    DOB: 1932/01/15, 77 y.o.   MRN: [id number]  HPI 77 year old male with history of CAD, hyperlipidemia, and hypertension presents for followup of several month history of breast pain. He first noticed his breast pain several months ago after having a cat bite. He was treated after the cat bite with Augmentin and the bite resolved but his breast pain continued. He describes the pain is superficial and located over the nipple. It is symmetric bilaterally. He denies any noted masses. He denies any skin changes. He denies any drainage from his nipple. He denies any trauma to the breast. He had recent lab work including a testosterone level which was noted to be low. He was sent to urology for evaluation and they checked a prolactin and thyroid levels which were also normal. He is concerned that something in his environment such as soap or lotion or medication is causing his breast pain. He denies any fever, chills, fatigue, or other symptoms.  Outpatient Encounter Prescriptions as of 12/22/2010  Medication Sig Dispense Refill  . aspirin 81 MG EC tablet Take 81 mg by mouth daily.        . cetirizine (ZYRTEC) 10 MG tablet Take 10 mg by mouth daily.        . clonazePAM (KLONOPIN) 0.5 MG tablet Take 1 tablet (0.5 mg total) by mouth daily.  30 tablet  3  . clopidogrel (PLAVIX) 75 MG tablet Take 1 tablet (75 mg total) by mouth daily.  30 tablet  11  . ezetimibe-simvastatin (VYTORIN) 10-40 MG per tablet Take 0.5 tablets by mouth at bedtime.        . nitroGLYCERIN (NITROSTAT) 0.4 MG SL tablet Place 0.4 mg under the tongue every 5 (five) minutes as needed.        . polyethylene glycol (MIRALAX / GLYCOLAX) packet Take 17 g by mouth daily.        [name] telmisartan (MICARDIS) 20 MG tablet Take 0.5 tablets (10 mg total) by mouth daily.  45 tablet  3    Review of Systems  Constitutional: Negative for fever, chills, activity change, appetite change, fatigue and  unexpected weight change.  Eyes: Negative for visual disturbance.  Respiratory: Negative for cough and shortness of breath.   Cardiovascular: Positive for chest pain (breast pain). Negative for palpitations and leg swelling.  Gastrointestinal: Negative for abdominal pain and abdominal distention.  Genitourinary: Negative for dysuria, urgency and difficulty urinating.  Musculoskeletal: Negative for arthralgias and gait problem.  Skin: Negative for color change and rash.  Hematological: Negative for adenopathy.  Psychiatric/Behavioral: Negative for sleep disturbance and dysphoric mood. The patient is not nervous/anxious.    BP 132/70  Pulse 65  Temp(Src) 97.6 F (36.4 C) (Oral)  Wt 164 lb (74.39 kg)  SpO2 97%     Objective:   Physical Exam  Constitutional: He is oriented to person, place, and time. He appears well-developed and well-nourished. No distress.  HENT:  Head: Normocephalic and atraumatic.  Right Ear: External ear normal.  Left Ear: External ear normal.  Nose: Nose normal.  Mouth/Throat: Oropharynx is clear and moist. No oropharyngeal exudate.  Eyes: Conjunctivae and EOM are normal. Pupils are equal, round, and reactive to light. Right eye exhibits no discharge. Left eye exhibits no discharge. No scleral icterus.  Neck: Normal range of motion. Neck supple. No tracheal deviation present. No thyromegaly present.  Cardiovascular: Normal rate, regular rhythm and normal heart sounds.  Exam reveals  no gallop and no friction rub.   No murmur heard. Pulmonary/Chest: Effort normal and breath sounds normal. No respiratory distress. He has no wheezes. He has no rales. He exhibits no tenderness. Right breast exhibits no inverted nipple, no mass, no nipple discharge, no skin change and no tenderness. Left breast exhibits no inverted nipple, no mass, no nipple discharge, no skin change and no tenderness. Breasts are symmetrical.    Musculoskeletal: Normal range of motion. He exhibits no  edema.  Lymphadenopathy:    He has no cervical adenopathy.  Neurological: He is alert and oriented to person, place, and time. No cranial nerve deficit. Coordination normal.  Skin: Skin is warm and dry. No rash noted. He is not diaphoretic. No erythema. No pallor.  Psychiatric: He has a normal mood and affect. His behavior is normal. Judgment and thought content normal.          Assessment & Plan:  1. Breast pain - exam is remarkable only for mild gynecomastia. Will get mammogram given persistence of breast pain. We'll set him up with endocrinology for further evaluation given gynecomastia and low testosterone. Question if one of his medications may be contributing to his symptoms. We discussed stopping Vytorin for one week to see if there's any improvement. He will call us and update. He will followup in one month.

## 2010-12-23 ENCOUNTER — Ambulatory Visit: Payer: Medicare Other | Admitting: Internal Medicine

## 2010-12-24 ENCOUNTER — Telehealth: Payer: Self-pay | Admitting: Internal Medicine

## 2010-12-24 NOTE — Telephone Encounter (Signed)
Mammogram and US breast showed gynecomastia. Would recommend follow up with endocrinology as we discussed

## 2010-12-27 NOTE — Telephone Encounter (Signed)
Patient informed. 

## 2010-12-31 ENCOUNTER — Telehealth: Payer: Self-pay | Admitting: Internal Medicine

## 2010-12-31 MED ORDER — CLOPIDOGREL BISULFATE 75 MG PO TABS: 75.0000 mg | ORAL_TABLET | Freq: Every day | ORAL | Status: DC

## 2010-12-31 NOTE — Telephone Encounter (Signed)
[id number] Pt wanted to change From clopidogrel 75mg  tab (mylan) #30 he would like to change to a different generic med He wants to used [facility] on [address] For this med only

## 2010-12-31 NOTE — Telephone Encounter (Signed)
Done,  Patient informed

## 2011-01-11 HISTORY — PX: COLONOSCOPY: SHX174

## 2011-04-25 ENCOUNTER — Ambulatory Visit: Payer: BC Managed Care – PPO | Admitting: Internal Medicine

## 2011-04-27 ENCOUNTER — Encounter: Payer: Self-pay | Admitting: Internal Medicine

## 2011-04-27 ENCOUNTER — Ambulatory Visit (INDEPENDENT_AMBULATORY_CARE_PROVIDER_SITE_OTHER): Payer: BC Managed Care – PPO | Admitting: Internal Medicine

## 2011-04-27 DIAGNOSIS — K5900 Constipation, unspecified: Secondary | ICD-10-CM | POA: Insufficient documentation

## 2011-04-27 DIAGNOSIS — I10 Essential (primary) hypertension: Secondary | ICD-10-CM

## 2011-04-27 DIAGNOSIS — N644 Mastodynia: Secondary | ICD-10-CM | POA: Insufficient documentation

## 2011-04-27 DIAGNOSIS — E785 Hyperlipidemia, unspecified: Secondary | ICD-10-CM

## 2011-04-27 DIAGNOSIS — Z1211 Encounter for screening for malignant neoplasm of colon: Secondary | ICD-10-CM

## 2011-04-27 MED ORDER — LUBIPROSTONE 24 MCG PO CAPS: 24.0000 ug | ORAL_CAPSULE | Freq: Two times a day (BID) | ORAL | Status: AC

## 2011-04-27 NOTE — Assessment & Plan Note (Signed)
Patient is over due for screening colonoscopy. Will set this up. 

## 2011-04-27 NOTE — Assessment & Plan Note (Signed)
Blood pressure is well-controlled today. Will check renal function with labs. Continue my card his.

## 2011-04-27 NOTE — Assessment & Plan Note (Signed)
Patient with persistent constipation despite using MiraLax daily and increasing fiber in his diet. Will give trial of Amitiza. He will call or e-mail with update.

## 2011-04-27 NOTE — Assessment & Plan Note (Signed)
Patient was seen and evaluated by local endocrinologist. He was started on tamoxifen with some improvement in his breast pain. However, he is having hot flashes and would like to try. Off tamoxifen. He will stop tamoxifen today and call or e-mail with update.

## 2011-04-27 NOTE — Progress Notes (Signed)
Subjective:    Patient ID: [name], male    DOB: 01/23/1932, 78 y.o.   MRN: [id number]  HPI 78 year old male with history of CAD, hypertension, hyperlipidemia, bilateral breast pain presents for followup. His primary concern today is constipation. He notes he has been using MiraLax one to 2 times per day and has increased his intake of fiber with no improvement in his symptoms. He denies any abdominal pain, blood in his stool, nausea, vomiting, or other symptoms.  He is also concerned today about side effects from the tamoxifen which she has been taking. He was started on tamoxifen by endocrinology after being evaluated for bilateral breast pain. He notes improvement in his breast pain but has noted hot flashes while taking tamoxifen. He has cut down the medication to once per day and the like to stop this medication.  In regards to his hypertension hyperlipidemia, he reports full compliance with his medications. He has not had any recent chest pain, headache, or palpitations.  Outpatient Encounter Prescriptions as of 04/27/2011  Medication Sig Dispense Refill  . aspirin 81 MG EC tablet Take 81 mg by mouth daily.        . clopidogrel (PLAVIX) 75 MG tablet Take 1 tablet (75 mg total) by mouth daily.  30 tablet  11  . ezetimibe-simvastatin (VYTORIN) 10-40 MG per tablet Take 0.5 tablets by mouth at bedtime.        . nitroGLYCERIN (NITROSTAT) 0.4 MG SL tablet Place 0.4 mg under the tongue every 5 (five) minutes as needed.        . tamoxifen (NOLVADEX) 10 MG tablet Take 10 mg by mouth daily.      [name] DISCONTD: polyethylene glycol (MIRALAX / GLYCOLAX) packet Take 17 g by mouth daily.        . cetirizine (ZYRTEC) 10 MG tablet Take 10 mg by mouth daily.        . clonazePAM (KLONOPIN) 0.5 MG tablet Take 1 tablet (0.5 mg total) by mouth daily.  30 tablet  3  . lubiprostone (AMITIZA) 24 MCG capsule Take 1 capsule (24 mcg total) by mouth 2 (two) times daily with a meal.  30 capsule  3  . telmisartan  (MICARDIS) 20 MG tablet Take 0.5 tablets (10 mg total) by mouth daily.  45 tablet  3    Review of Systems  Constitutional: Positive for diaphoresis. Negative for fever, chills, activity change, appetite change, fatigue and unexpected weight change.  Eyes: Negative for visual disturbance.  Respiratory: Negative for cough and shortness of breath.   Cardiovascular: Negative for chest pain, palpitations and leg swelling.  Gastrointestinal: Positive for constipation. Negative for nausea, vomiting, abdominal pain, diarrhea, blood in stool, abdominal distention, anal bleeding and rectal pain.  Genitourinary: Negative for dysuria, urgency and difficulty urinating.  Musculoskeletal: Negative for arthralgias and gait problem.  Skin: Negative for color change and rash.  Hematological: Negative for adenopathy.  Psychiatric/Behavioral: Negative for sleep disturbance and dysphoric mood. The patient is not nervous/anxious.    BP 118/68  Pulse 76  Temp(Src) 98.2 F (36.8 C) (Oral)  Ht 6\' 1"  (1.854 m)  Wt 170 lb (77.111 kg)  BMI 22.43 kg/m2  SpO2 97%     Objective:   Physical Exam  Constitutional: He is oriented to person, place, and time. He appears well-developed and well-nourished. No distress.  HENT:  Head: Normocephalic and atraumatic.  Right Ear: External ear normal.  Left Ear: External ear normal.  Nose: Nose normal.  Mouth/Throat: Oropharynx is  clear and moist. No oropharyngeal exudate.  Eyes: Conjunctivae and EOM are normal. Pupils are equal, round, and reactive to light. Right eye exhibits no discharge. Left eye exhibits no discharge. No scleral icterus.  Neck: Normal range of motion. Neck supple. No tracheal deviation present. No thyromegaly present.  Cardiovascular: Normal rate, regular rhythm and normal heart sounds.  Exam reveals no gallop and no friction rub.   No murmur heard. Pulmonary/Chest: Effort normal and breath sounds normal. No respiratory distress. He has no wheezes. He  has no rales. He exhibits no tenderness.  Abdominal: Soft. Bowel sounds are normal. He exhibits no distension. There is no tenderness.  Musculoskeletal: Normal range of motion. He exhibits no edema.  Lymphadenopathy:    He has no cervical adenopathy.  Neurological: He is alert and oriented to person, place, and time. No cranial nerve deficit. Coordination normal.  Skin: Skin is warm and dry. No rash noted. He is not diaphoretic. No erythema. No pallor.  Psychiatric: He has a normal mood and affect. His behavior is normal. Judgment and thought content normal.          Assessment & Plan:

## 2011-04-27 NOTE — Assessment & Plan Note (Signed)
Will check lipids and LFTs with labs today. 

## 2011-05-04 ENCOUNTER — Telehealth: Payer: Self-pay | Admitting: Internal Medicine

## 2011-05-04 NOTE — Telephone Encounter (Signed)
Opened in error

## 2011-05-10 ENCOUNTER — Other Ambulatory Visit: Payer: Self-pay | Admitting: *Deleted

## 2011-05-10 MED ORDER — POLYETHYLENE GLYCOL 3350 17 GM/SCOOP PO POWD: 17.0000 g | ORAL | Status: DC

## 2011-05-10 NOTE — Telephone Encounter (Signed)
Done

## 2011-05-12 ENCOUNTER — Telehealth: Payer: Self-pay | Admitting: Internal Medicine

## 2011-05-12 MED ORDER — POLYETHYLENE GLYCOL 3350 17 GM/SCOOP PO POWD: 17.0000 g | ORAL | Status: DC

## 2011-05-12 NOTE — Telephone Encounter (Signed)
Patient came in today and wanted to get a rx for polyehtylene glycol   Pt wants the largest gram that he can get. [facility]  Pt got one filled for him to do his colonoscopy 05/24/11. He wanted to know if the pharmacy could fill another for his daily intake.  Pt wants large container not the smaller Pt stated that imatezia does not work for him

## 2011-05-12 NOTE — Telephone Encounter (Signed)
Called in Rx for miralax.  He should take 1 capful mixed in liquid up to three times daily.  If he needs more than this, he should be seen.

## 2011-05-12 NOTE — Telephone Encounter (Signed)
Left detailed message notifying patient.

## 2011-05-24 ENCOUNTER — Ambulatory Visit: Payer: Self-pay | Admitting: General Surgery

## 2011-05-30 ENCOUNTER — Telehealth: Payer: Self-pay | Admitting: *Deleted

## 2011-05-30 ENCOUNTER — Telehealth: Payer: Self-pay | Admitting: Internal Medicine

## 2011-05-30 ENCOUNTER — Ambulatory Visit (INDEPENDENT_AMBULATORY_CARE_PROVIDER_SITE_OTHER): Payer: BC Managed Care – PPO | Admitting: Internal Medicine

## 2011-05-30 ENCOUNTER — Ambulatory Visit: Payer: Self-pay | Admitting: Internal Medicine

## 2011-05-30 ENCOUNTER — Encounter: Payer: Self-pay | Admitting: Internal Medicine

## 2011-05-30 VITALS — BP 138/80 | HR 58 | Temp 97.7°F | Resp 14 | Wt 165.5 lb

## 2011-05-30 DIAGNOSIS — D649 Anemia, unspecified: Secondary | ICD-10-CM

## 2011-05-30 DIAGNOSIS — R142 Eructation: Secondary | ICD-10-CM

## 2011-05-30 DIAGNOSIS — R232 Flushing: Secondary | ICD-10-CM | POA: Insufficient documentation

## 2011-05-30 DIAGNOSIS — R140 Abdominal distension (gaseous): Secondary | ICD-10-CM

## 2011-05-30 DIAGNOSIS — E119 Type 2 diabetes mellitus without complications: Secondary | ICD-10-CM

## 2011-05-30 DIAGNOSIS — E039 Hypothyroidism, unspecified: Secondary | ICD-10-CM

## 2011-05-30 LAB — CBC WITH DIFFERENTIAL/PLATELET
Basophils Relative: 0.4 % (ref 0.0–3.0)
Eosinophils Relative: 5 % (ref 0.0–5.0)
HCT: 38.3 % — ABNORMAL LOW (ref 39.0–52.0)
Hemoglobin: 12.6 g/dL — ABNORMAL LOW (ref 13.0–17.0)
Lymphs Abs: 1.1 10*3/uL (ref 0.7–4.0)
MCV: 85.7 fl (ref 78.0–100.0)
Monocytes Relative: 9.8 % (ref 3.0–12.0)
Neutro Abs: 2.8 10*3/uL (ref 1.4–7.7)
WBC: 4.7 10*3/uL (ref 4.5–10.5)

## 2011-05-30 LAB — COMPREHENSIVE METABOLIC PANEL
AST: 20 U/L (ref 0–37)
Alkaline Phosphatase: 62 U/L (ref 39–117)
BUN: 17 mg/dL (ref 6–23)
Glucose, Bld: 117 mg/dL — ABNORMAL HIGH (ref 70–99)
Sodium: 140 mEq/L (ref 135–145)
Total Bilirubin: 0.9 mg/dL (ref 0.3–1.2)

## 2011-05-30 NOTE — Assessment & Plan Note (Signed)
Likely secondary to constipation, however given recent symptoms of weight loss, hot flashes will set up abdominal ultrasound for further evaluation.

## 2011-05-30 NOTE — Progress Notes (Signed)
Subjective:    Patient ID: [name], male    DOB: 01-30-1932, 78 y.o.   MRN: [id number]  HPI 78 year old male with history of hyperlipidemia and hypertension presents for acute visit complaining of intermittent sweats. These have been ongoing for a couple of weeks. They occur mostly at night. He has also noted a 5 pound unintentional weight loss. He denies any enlarged lymph nodes, chest pain, abdominal pain. He has noted some constipation recently since his colonoscopy last week. He also notes some abdominal distention. He reports that he has been checking his blood sugars and his blood sugars have been elevated. He has no history of diabetes. He reports fasting sugar this morning was 131.  Outpatient Encounter Prescriptions as of 05/30/2011  Medication Sig Dispense Refill  . aspirin 81 MG EC tablet Take 81 mg by mouth daily.        . clopidogrel (PLAVIX) 75 MG tablet Take 1 tablet (75 mg total) by mouth daily.  30 tablet  11  . ezetimibe-simvastatin (VYTORIN) 10-40 MG per tablet Take 0.5 tablets by mouth at bedtime.        . nitroGLYCERIN (NITROSTAT) 0.4 MG SL tablet Place 0.4 mg under the tongue every 5 (five) minutes as needed.        . cetirizine (ZYRTEC) 10 MG tablet Take 10 mg by mouth daily.        . clonazePAM (KLONOPIN) 0.5 MG tablet Take 1 tablet (0.5 mg total) by mouth daily.  30 tablet  3  . telmisartan (MICARDIS) 20 MG tablet Take 0.5 tablets (10 mg total) by mouth daily.  45 tablet  3    Review of Systems  Constitutional: Positive for chills and diaphoresis. Negative for fever, activity change, appetite change, fatigue and unexpected weight change.  Eyes: Negative for visual disturbance.  Respiratory: Negative for cough and shortness of breath.   Cardiovascular: Negative for chest pain, palpitations and leg swelling.  Gastrointestinal: Positive for constipation and abdominal distention. Negative for abdominal pain.  Genitourinary: Negative for dysuria, urgency and  difficulty urinating.  Musculoskeletal: Negative for arthralgias and gait problem.  Skin: Negative for color change and rash.  Hematological: Negative for adenopathy.  Psychiatric/Behavioral: Negative for sleep disturbance and dysphoric mood. The patient is not nervous/anxious.    BP 138/80  Pulse 58  Temp(Src) 97.7 F (36.5 C) (Oral)  Resp 14  Wt 165 lb 8 oz (75.07 kg)  SpO2 100%     Objective:   Physical Exam  Constitutional: He is oriented to person, place, and time. He appears well-developed and well-nourished. No distress.  HENT:  Head: Normocephalic and atraumatic.  Right Ear: External ear normal.  Left Ear: External ear normal.  Nose: Nose normal.  Mouth/Throat: Oropharynx is clear and moist. No oropharyngeal exudate.  Eyes: Conjunctivae and EOM are normal. Pupils are equal, round, and reactive to light. Right eye exhibits no discharge. Left eye exhibits no discharge. No scleral icterus.  Neck: Normal range of motion. Neck supple. No tracheal deviation present. No thyromegaly present.  Cardiovascular: Normal rate, regular rhythm and normal heart sounds.  Exam reveals no gallop and no friction rub.   No murmur heard. Pulmonary/Chest: Effort normal and breath sounds normal. No respiratory distress. He has no wheezes. He has no rales. He exhibits no tenderness.  Abdominal: Soft. Bowel sounds are normal. He exhibits distension. He exhibits no mass. There is no tenderness. There is no rebound and no guarding.  Musculoskeletal: Normal range of motion. He exhibits no  edema.  Lymphadenopathy:    He has no cervical adenopathy.  Neurological: He is alert and oriented to person, place, and time. No cranial nerve deficit. Coordination normal.  Skin: Skin is warm and dry. No rash noted. He is not diaphoretic. No erythema. No pallor.  Psychiatric: He has a normal mood and affect. His behavior is normal. Judgment and thought content normal.          Assessment & Plan:

## 2011-05-30 NOTE — Telephone Encounter (Signed)
Radiology could not visualize spleen and pancreas on [place]. Recommended CT abdomen and pelvis with contrast. We need to set up.

## 2011-05-30 NOTE — Assessment & Plan Note (Signed)
Unclear etiology. Will check TSH, CMP, A1c, CBC with labs today. Given that he is also having some abdominal distention and has had a 5 pound weight loss, we'll also get abdominal ultrasound. He will followup in 2 weeks.

## 2011-05-30 NOTE — Telephone Encounter (Signed)
Call Report: US Abdomen Pt has [known] small cyst on LLL Liver; distal Aorta prominent measuring 2.4 cm Dr. Dan Humphreys informed, Ok to send patient home; caller informed/SLS

## 2011-05-30 NOTE — Patient Instructions (Signed)
Try Colace twice daily

## 2011-05-30 NOTE — Telephone Encounter (Signed)
Patient informed that he will receive phone call when CT has been set-up; only request to have scheduled after Thursday 05.23.13/SLS

## 2011-05-31 LAB — TESTOSTERONE, FREE, TOTAL, SHBG
Testosterone, Free: 8.4 pg/mL — ABNORMAL LOW (ref 47.0–244.0)
Testosterone-% Free: 0.9 % — ABNORMAL LOW (ref 1.6–2.9)

## 2011-05-31 LAB — TSH: TSH: 0.82 u[IU]/mL (ref 0.35–5.50)

## 2011-05-31 NOTE — Telephone Encounter (Signed)
Patient is scheduled for [facility]/03/11 at [facility] at 2:45 for a 3:00 scan.  Patient needs to pick up prep-kit and take a list of medication with him.  I have giving him all of this information over the phone.

## 2011-06-01 ENCOUNTER — Other Ambulatory Visit: Payer: Self-pay | Admitting: *Deleted

## 2011-06-01 ENCOUNTER — Encounter: Payer: Self-pay | Admitting: Internal Medicine

## 2011-06-01 DIAGNOSIS — E349 Endocrine disorder, unspecified: Secondary | ICD-10-CM

## 2011-06-01 NOTE — Progress Notes (Signed)
Per JAW, see phone note; patient informed/SLS

## 2011-06-03 ENCOUNTER — Ambulatory Visit: Payer: Self-pay | Admitting: Internal Medicine

## 2011-06-08 ENCOUNTER — Telehealth: Payer: Self-pay | Admitting: Internal Medicine

## 2011-06-08 ENCOUNTER — Encounter: Payer: Self-pay | Admitting: Internal Medicine

## 2011-06-08 NOTE — Telephone Encounter (Signed)
CT abdomen was normal. 

## 2011-06-08 NOTE — Telephone Encounter (Signed)
Patient notified

## 2011-06-13 ENCOUNTER — Telehealth: Payer: Self-pay | Admitting: Internal Medicine

## 2011-06-13 NOTE — Telephone Encounter (Signed)
Pt came in today wanting to know why he had 2 scans done within 2 last weeks. Pt wanted to know why the tech could not find pancreasa and spleen.  Pt wanted to know why he had to pay for this when the tech couldn't find what she was looking for.  Also he stated that he didn't understand the labs resutls.  Please advise

## 2011-06-13 NOTE — Telephone Encounter (Signed)
Left message on machine at home for patient to return call. 

## 2011-06-15 NOTE — Telephone Encounter (Signed)
Spoke with patient and he understands why he had the scans done.  He stated that his insurance company will pay for them so it isn't a problem.  He has several appointments and he will follow up after the appointments.

## 2011-06-17 ENCOUNTER — Ambulatory Visit (INDEPENDENT_AMBULATORY_CARE_PROVIDER_SITE_OTHER): Payer: BC Managed Care – PPO | Admitting: Cardiovascular Disease

## 2011-06-17 ENCOUNTER — Encounter: Payer: Self-pay | Admitting: Cardiovascular Disease

## 2011-06-17 VITALS — BP 114/72 | HR 63 | Ht 72.0 in | Wt 164.2 lb

## 2011-06-17 DIAGNOSIS — E785 Hyperlipidemia, unspecified: Secondary | ICD-10-CM

## 2011-06-17 DIAGNOSIS — I10 Essential (primary) hypertension: Secondary | ICD-10-CM

## 2011-06-17 DIAGNOSIS — L749 Eccrine sweat disorder, unspecified: Secondary | ICD-10-CM

## 2011-06-17 DIAGNOSIS — I499 Cardiac arrhythmia, unspecified: Secondary | ICD-10-CM

## 2011-06-17 DIAGNOSIS — I2510 Atherosclerotic heart disease of native coronary artery without angina pectoris: Secondary | ICD-10-CM

## 2011-06-17 DIAGNOSIS — L748 Other eccrine sweat disorders: Secondary | ICD-10-CM

## 2011-06-17 NOTE — Assessment & Plan Note (Signed)
Currently with no symptoms of angina. No further workup at this time. Continue current medication regimen. He does have symptoms of sweating. He will meet with endocrine and likely start on testosterone supplementation. If this does not only his symptoms, we could perform a stress test or other cardiac workup.

## 2011-06-17 NOTE — Assessment & Plan Note (Signed)
The etiology of his symptoms could be secondary to low testosterone. He has a consultation with endocrine on Monday next week. If no resolution of his symptoms with medical management, further cardiac evaluation could be done.

## 2011-06-17 NOTE — Patient Instructions (Signed)
You are doing well. No medication changes were made.  Please call if sweating continues. We would order a stress test.  Please call us if you have new issues that need to be addressed before your next appt.  Your physician wants you to follow-up in: 6 months.  You will receive a reminder letter in the mail two months in advance. If you don't receive a letter, please call our office to schedule the follow-up appointment.

## 2011-06-17 NOTE — Progress Notes (Signed)
Patient ID: [name], male    DOB: 07-27-1932, 78 y.o.   MRN: [id number]  HPI Comments: [name] is a very pleasant 78 year old gentleman with a history of coronary artery disease, stent placed to his LAD in 1993, stent placed to his mid RCA in 2003, in-stent restenosis with repeat stent placed to his LAD in September 08, stent placed to his RCA at the same time, stress test in February 2009 which showed no ischemia, also with history of atrial fibrillation Over 10 years ago, maintained on low dose amiodarone who presents for routine followup.  He reports that he has been diagnosed with a very low testosterone. He wonders if this could be contributing to his poor energy, occasional sweats at night time. He has difficulty gaining weight despite needing a significant amount of food. He has a reasonable exercise tolerance. 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 He does not do exercise on a regular basis. He does have erectile dysfunction which has been there since his prostate surgery.  Most recent lab work shows total cholesterol 150, LDL 83. He has been cutting his Vytorin in half.  EKG shows sinus bradycardia with rate 63 beats per minute, no significant ST or T wave changes, first degree AV block    Outpatient Encounter Prescriptions as of 06/17/2011  Medication Sig Dispense Refill  . aspirin 81 MG EC tablet Take 81 mg by mouth daily.        . cetirizine (ZYRTEC) 10 MG tablet Take 10 mg by mouth as needed.       . clonazePAM (KLONOPIN) 0.5 MG tablet Take 0.5 mg by mouth as needed.      . clopidogrel (PLAVIX) 75 MG tablet Take 1 tablet (75 mg total) by mouth daily.  30 tablet  11  . ezetimibe-simvastatin (VYTORIN) 10-40 MG per tablet Take 0.5 tablets by mouth at bedtime.        . nitroGLYCERIN (NITROSTAT) 0.4 MG SL tablet Place 0.4 mg under the tongue every 5 (five) minutes as needed.        . Polyethylene Glycol 3350 (MIRALAX PO) Take 17 mg by  mouth as needed.       Review of Systems  Constitutional: Positive for fatigue.  HENT: Negative.   Eyes: Negative.   Respiratory: Negative.   Cardiovascular: Negative.   Gastrointestinal: Negative.   Musculoskeletal: Negative.   Skin: Negative.   Neurological: Negative.   Hematological: Negative.   Psychiatric/Behavioral: Positive for sleep disturbance and dysphoric mood.  All other systems reviewed and are negative.   BP 114/72  Pulse 63  Ht 6' (1.829 m)  Wt 164 lb 4 oz (74.503 kg)  BMI 22.28 kg/m2  Physical Exam  Nursing note and vitals reviewed. Constitutional: He is oriented to person, place, and time. He appears well-developed and well-nourished.  HENT:  Head: Normocephalic.  Nose: Nose normal.  Mouth/Throat: Oropharynx is clear and moist.  Eyes: Conjunctivae are normal. Pupils are equal, round, and reactive to light.  Neck: Normal range of motion. Neck supple. No JVD present.  Cardiovascular: Normal rate, regular rhythm, S1 normal, S2 normal, normal heart sounds and intact distal pulses.  Exam reveals no gallop and no friction rub.   No murmur heard. Pulmonary/Chest: Effort normal and breath sounds normal. No respiratory distress. He has no wheezes. He has no rales. He exhibits no tenderness.  Abdominal: Soft. Bowel sounds are normal. He exhibits no distension. There is  no tenderness.  Musculoskeletal: Normal range of motion. He exhibits no edema and no tenderness.  Lymphadenopathy:    He has no cervical adenopathy.  Neurological: He is alert and oriented to person, place, and time. Coordination normal.  Skin: Skin is warm and dry. No rash noted. No erythema.  Psychiatric: He has a normal mood and affect. His behavior is normal. Judgment and thought content normal.           Assessment and Plan

## 2011-06-17 NOTE — Assessment & Plan Note (Signed)
Cholesterol close to goal. Continue current dose of Vytorin

## 2011-06-17 NOTE — Assessment & Plan Note (Signed)
Blood pressure is well controlled on today's visit. No changes made to the medications. 

## 2011-06-20 ENCOUNTER — Ambulatory Visit (INDEPENDENT_AMBULATORY_CARE_PROVIDER_SITE_OTHER): Payer: BC Managed Care – PPO | Admitting: Endocrinology

## 2011-06-20 ENCOUNTER — Encounter: Payer: Self-pay | Admitting: Endocrinology

## 2011-06-20 VITALS — BP 132/72 | HR 60 | Temp 97.8°F | Ht 74.0 in | Wt 165.0 lb

## 2011-06-20 DIAGNOSIS — N62 Hypertrophy of breast: Secondary | ICD-10-CM

## 2011-06-20 DIAGNOSIS — C61 Malignant neoplasm of prostate: Secondary | ICD-10-CM

## 2011-06-20 DIAGNOSIS — E291 Testicular hypofunction: Secondary | ICD-10-CM

## 2011-06-20 DIAGNOSIS — R634 Abnormal weight loss: Secondary | ICD-10-CM

## 2011-06-20 NOTE — Patient Instructions (Addendum)
You should have a bone-density test, a you are are at risk for osteoporosis.  Please call if you want to schedule this.   If you want to raise the testosterone, you would first need to get permission from dr Achilles Dunk.  i am not certain he would say this is ok.  You should go along with whatever he recommends.   If dr Achilles Dunk says it is ok, the next step would be to check another blood test, to see if this could be treated with a pill.  You should assume that the weight loss has some other cause.

## 2011-06-20 NOTE — Progress Notes (Signed)
Subjective:    Patient ID: [name], male    DOB: 06-Aug-1932, 78 y.o.   MRN: [id number]  HPI Pt had prostatectomy in 2004, for cancer.  He did not have any other treatment for this.  Specifically, he did not have any other medication or surgery to suppress the testosterone.  Since then, he has had persistently undetectable PSA.   Pt says testosterone has been low for at least a few years. He had few weeks of slight swelling at the breast areas, and assoc pain.  He took tamoxifen x a few mos, and stopped it a few mos ago.   Past Medical History  Diagnosis Date  . Coronary artery disease     s/p 5 x stent  . Hyperlipidemia   . Hypertension   . Hyperthyroidism   . Atrial fibrillation     stopped in 2012    Past Surgical History  Procedure Date  . Cardiac catheterization 2008  . Prostatectomy   . Hernia repair     s/p mesh bilaterally, Dr. Lemar Livings  . Appendectomy     Dr. Lemar Livings  . Colonoscopy 2013    History   Social History  . Marital Status: Married    Spouse Name: N/A    Number of Children: N/A  . Years of Education: N/A   Occupational History  . Not on file.   Social History Main Topics  . Smoking status: Former Smoker    Types: Cigars    Quit date: 10/04/1988  . Smokeless tobacco: Former Neurosurgeon    Quit date: 09/04/1991  . Alcohol Use: 0.5 oz/week    1 drink(s) per week     occassionaly  . Drug Use: No  . Sexually Active: Not Currently   Other Topics Concern  . Not on file   Social History Narrative   Lives in [place] with wife. Has 2 cats, dog died. Daughter in [place], 2 step-daughters. Retired from [employer], worked in Arts administrator. Grew up in [place].    Current Outpatient Prescriptions on File Prior to Visit  Medication Sig Dispense Refill  . aspirin 81 MG EC tablet Take 81 mg by mouth daily.        . cetirizine (ZYRTEC) 10 MG tablet Take 10 mg by mouth as needed.       . clonazePAM (KLONOPIN) 0.5 MG tablet Take 0.5 mg by mouth as needed.      .  clopidogrel (PLAVIX) 75 MG tablet Take 1 tablet (75 mg total) by mouth daily.  30 tablet  11  . nitroGLYCERIN (NITROSTAT) 0.4 MG SL tablet Place 0.4 mg under the tongue every 5 (five) minutes as needed.        . Polyethylene Glycol 3350 (MIRALAX PO) Take 17 mg by mouth as needed.      . ezetimibe-simvastatin (VYTORIN) 10-40 MG per tablet Take 0.5 tablets by mouth at bedtime.          Allergies  Allergen Reactions  . Statins     REACTION: muscle aches...crestor  . Zolpidem Tartrate     Family History  Problem Relation Age of Onset  . Coronary artery disease Mother   . Heart disease Mother   . Coronary artery disease Sister   . Diabetes Sister   . Heart disease Sister 64    deceased  . Diabetes Father   . Heart disease Brother   no one with low testosterone  BP 132/72  Pulse 60  Temp(Src) 97.8 F (36.6 C) (Oral)  Ht 6\' 2"  (1.88 m)  Wt 165 lb (74.844 kg)  BMI 21.18 kg/m2  SpO2 99%    Review of Systems denies polyuria, depression, numbness, fever, dysuria, headache, sob, rash, blurry vision, and chest pain.  He has chronic weight loss, easy bruising, rhinorrhea, constipation, ED sxs, muscle weakness, and excessive diaphoresis.     Objective:   Physical Exam VS: see vs page GEN: no distress HEAD: head: no deformity eyes: no periorbital swelling, no proptosis external nose and ears are normal mouth: no lesion seen NECK: supple, thyroid is not enlarged CHEST [place]: no deformity LUNGS: clear to auscultation BREASTS:  Slight bilat pseudogynecomastia CV: reg rate and rhythm, no murmur ABD: abdomen is soft, nontender.  no hepatosplenomegaly.  not distended.  no hernia.  Healed low midline surgical scar. GENITALIA:  Normal male, except testes are slightly mall and soft MUSCULOSKELETAL: muscle bulk and strength are grossly normal.  no obvious joint swelling.  gait is normal and steady EXTEMITIES: no deformity.  no ulcer on the feet.  feet are of normal color and temp.  no  edema.  Few ecchymoses on the forearms PULSES: dorsalis pedis intact bilat.  no carotid bruit NEURO:  cn 2-12 grossly intact.   readily moves all 4's.  sensation is intact to touch on the feet SKIN:  Normal texture and temperature.  No rash or suspicious lesion is visible.   NODES:  None palpable at the neck PSYCH: alert, oriented x3.  Does not appear anxious nor depressed.   outside test results are reviewed: psa is undetectable April, 2013    Assessment & Plan:  Hypogonadism.  uncertain etiology H/o prostate cancer.  urol would have to approve any rx for the hypogonadism, and i am uncertain they would. Gynecomastia, improved with tamoxifen.  Hypogonadism prob contributes to this. Weight loss.  uncertain etiology

## 2011-06-22 DIAGNOSIS — N62 Hypertrophy of breast: Secondary | ICD-10-CM | POA: Insufficient documentation

## 2011-06-22 DIAGNOSIS — R634 Abnormal weight loss: Secondary | ICD-10-CM | POA: Insufficient documentation

## 2011-06-22 DIAGNOSIS — C61 Malignant neoplasm of prostate: Secondary | ICD-10-CM | POA: Insufficient documentation

## 2011-06-22 DIAGNOSIS — E291 Testicular hypofunction: Secondary | ICD-10-CM | POA: Insufficient documentation

## 2011-06-23 ENCOUNTER — Encounter: Payer: Self-pay | Admitting: Internal Medicine

## 2011-09-06 DIAGNOSIS — Z8546 Personal history of malignant neoplasm of prostate: Secondary | ICD-10-CM | POA: Insufficient documentation

## 2011-09-06 DIAGNOSIS — N393 Stress incontinence (female) (male): Secondary | ICD-10-CM | POA: Insufficient documentation

## 2011-10-07 ENCOUNTER — Other Ambulatory Visit: Payer: Self-pay | Admitting: Cardiovascular Disease

## 2011-10-27 ENCOUNTER — Encounter: Payer: Self-pay | Admitting: Internal Medicine

## 2011-10-27 ENCOUNTER — Ambulatory Visit (INDEPENDENT_AMBULATORY_CARE_PROVIDER_SITE_OTHER): Payer: BC Managed Care – PPO | Admitting: Internal Medicine

## 2011-10-27 VITALS — BP 130/70 | HR 64 | Temp 98.2°F | Ht 73.0 in | Wt 171.5 lb

## 2011-10-27 DIAGNOSIS — Z0000 Encounter for general adult medical examination without abnormal findings: Secondary | ICD-10-CM | POA: Insufficient documentation

## 2011-10-27 DIAGNOSIS — E291 Testicular hypofunction: Secondary | ICD-10-CM

## 2011-10-27 DIAGNOSIS — E785 Hyperlipidemia, unspecified: Secondary | ICD-10-CM

## 2011-10-27 DIAGNOSIS — I10 Essential (primary) hypertension: Secondary | ICD-10-CM

## 2011-10-27 NOTE — Progress Notes (Signed)
Subjective:    Patient ID: [name], male    DOB: Dec 31, 1932, 78 y.o.   MRN: [id number]  HPI The patient is here for annual Medicare wellness examination and management of other chronic and acute problems.   The risk factors are reflected in the social history.  The roster of all physicians providing medical care to patient - is listed in the Snapshot section of the chart.  Activities of daily living:  The patient is 100% independent in all ADLs: dressing, toileting, feeding as well as independent mobility  Home safety : The patient does not smoke detectors in the home. They wear seatbelts.  There are no firearms at home. There is no violence in the home.   There is no risks for hepatitis, STDs or HIV. There is no history of blood transfusion. They have no travel history to infectious disease endemic areas of the world (except perhaps during travel to [place] in distant pass).  The patient has seen their dentist in the last six month. (Dr. Elayne [name]) They have seen their eye doctor in the last year. [facility]) They admit to slight hearing difficulty with regard to whispered voices and some television programs.  Also has bilateral tinnitus. They have deferred audiologic testing in the last year.  Was evaluated by Dr. Wandra [name] in the past. Had hearing aids in the past but no improvement.  They do not  have excessive sun exposure. Discussed the need for sun protection: hats, long sleeves and use of sunscreen if there is significant sun exposure. Has been seen by Dr. Gwen [name] and Dr. Roseanne [name] in the past.  Diet: the importance of a healthy diet is discussed. They do have a healthy diet.  The benefits of regular aerobic exercise were discussed. He works gardening for exercise. Walks occasionally.  Depression screen: there are no signs or vegative symptoms of depression- irritability, change in appetite, anhedonia, sadness/tearfullness.  Cognitive assessment: the patient  manages all their financial and personal affairs and is actively engaged. They could relate day,date,year and events.  HCPOA - [employer], wife, then [name], Daughter  The following portions of the patient's history were reviewed and updated as appropriate: allergies, current medications, past family history, past medical history,  past surgical history, past social history  and problem list.  Visual acuity was not assessed per patient preference since he has regular follow up with her ophthalmologist. Hearing and body mass index were assessed and reviewed.   During the course of the visit the patient was educated and counseled about appropriate screening and preventive services including : fall prevention , diabetes screening, nutrition counseling, colorectal cancer screening, and recommended immunizations.     Outpatient Encounter Prescriptions as of 10/27/2011  Medication Sig Dispense Refill  . aspirin 81 MG EC tablet Take 81 mg by mouth daily.        . clopidogrel (PLAVIX) 75 MG tablet Take 1 tablet (75 mg total) by mouth daily.  30 tablet  11  . ezetimibe-simvastatin (VYTORIN) 10-20 MG per tablet Take 0.5 tablets by mouth at bedtime.      . nitroGLYCERIN (NITROSTAT) 0.4 MG SL tablet Place 0.4 mg under the tongue every 5 (five) minutes as needed.        . ANDROGEL PUMP 20.25 MG/ACT (1.62%) GEL       . cetirizine (ZYRTEC) 10 MG tablet Take 10 mg by mouth as needed.       . clonazePAM (KLONOPIN) 0.5 MG tablet Take 0.5 mg  by mouth as needed.       BP 130/70  Pulse 64  Temp 98.2 F (36.8 C) (Oral)  Ht 6\' 1"  (1.854 m)  Wt 171 lb 8 oz (77.792 kg)  BMI 22.63 kg/m2  SpO2 95%  Review of Systems  Constitutional: Negative for fever, chills, activity change, appetite change, fatigue and unexpected weight change.  Eyes: Negative for visual disturbance.  Respiratory: Negative for cough and shortness of breath.   Cardiovascular: Negative for chest pain, palpitations and leg swelling.   Gastrointestinal: Negative for abdominal pain and abdominal distention.  Genitourinary: Negative for dysuria, urgency and difficulty urinating.  Musculoskeletal: Negative for arthralgias and gait problem.  Skin: Negative for color change and rash.  Hematological: Negative for adenopathy.  Psychiatric/Behavioral: Negative for disturbed wake/sleep cycle and dysphoric mood. The patient is not nervous/anxious.        Objective:   Physical Exam  Constitutional: He is oriented to person, place, and time. He appears well-developed and well-nourished. No distress.  HENT:  Head: Normocephalic and atraumatic.  Right Ear: External ear normal.  Left Ear: External ear normal.  Nose: Nose normal.  Mouth/Throat: Oropharynx is clear and moist. No oropharyngeal exudate.  Eyes: Conjunctivae normal and EOM are normal. Pupils are equal, round, and reactive to light. Right eye exhibits no discharge. Left eye exhibits no discharge. No scleral icterus.  Neck: Normal range of motion. Neck supple. No tracheal deviation present. No thyromegaly present.  Cardiovascular: Normal rate, regular rhythm and normal heart sounds.  Exam reveals no gallop and no friction rub.   No murmur heard. Pulmonary/Chest: Effort normal and breath sounds normal. No respiratory distress. He has no wheezes. He has no rales. He exhibits no tenderness.  Abdominal: Soft. Bowel sounds are normal. He exhibits no distension and no mass. There is no tenderness. There is no rebound and no guarding.  Musculoskeletal: Normal range of motion. He exhibits no edema.  Lymphadenopathy:    He has no cervical adenopathy.  Neurological: He is alert and oriented to person, place, and time. No cranial nerve deficit. Coordination normal.  Skin: Skin is warm and dry. No rash noted. He is not diaphoretic. No erythema. No pallor.  Psychiatric: He has a normal mood and affect. His behavior is normal. Judgment and thought content normal.            Assessment & Plan:

## 2011-10-27 NOTE — Assessment & Plan Note (Signed)
General medical exam is normal today. Health maintenance is up to date including vaccinations, colonoscopy, and lab work. Appropriate screening performed. Patient will followup in 6 months or sooner as needed.

## 2011-10-27 NOTE — Assessment & Plan Note (Signed)
Recent lipids were well controlled. Will plan to repeat lipids with blood work in November 2014. Will continue Vytorin. Followup in 6 months.

## 2011-10-27 NOTE — Assessment & Plan Note (Signed)
Currently under therapy with urology. Reports significant improvement in symptoms of sweating with use of AndroGel.

## 2011-11-14 ENCOUNTER — Other Ambulatory Visit: Payer: Self-pay | Admitting: Internal Medicine

## 2011-12-19 ENCOUNTER — Encounter: Payer: Self-pay | Admitting: Cardiovascular Disease

## 2011-12-19 ENCOUNTER — Ambulatory Visit (INDEPENDENT_AMBULATORY_CARE_PROVIDER_SITE_OTHER): Payer: BC Managed Care – PPO | Admitting: Cardiovascular Disease

## 2011-12-19 VITALS — BP 110/70 | HR 60 | Ht 73.0 in | Wt 170.0 lb

## 2011-12-19 DIAGNOSIS — E785 Hyperlipidemia, unspecified: Secondary | ICD-10-CM

## 2011-12-19 DIAGNOSIS — I2510 Atherosclerotic heart disease of native coronary artery without angina pectoris: Secondary | ICD-10-CM

## 2011-12-19 DIAGNOSIS — I10 Essential (primary) hypertension: Secondary | ICD-10-CM

## 2011-12-19 DIAGNOSIS — I4891 Unspecified atrial fibrillation: Secondary | ICD-10-CM

## 2011-12-19 NOTE — Assessment & Plan Note (Signed)
Blood pressure is well controlled on today's visit. No changes made to the medications. 

## 2011-12-19 NOTE — Assessment & Plan Note (Signed)
Maintaining normal sinus rhythm. Amiodarone held earlier this year

## 2011-12-19 NOTE — Progress Notes (Signed)
Patient ID: [name], male    DOB: Oct 19, 1932, 78 y.o.   MRN: [id number]  HPI Comments: Mr. Melander is a very pleasant 78 year old gentleman with a history of coronary artery disease, stent placed to his LAD in 1993, stent placed to his mid RCA in 2003, in-stent restenosis with repeat stent placed to his LAD in September 08, stent placed to his RCA at the same time, stress test in February 2009 which showed no ischemia, also with history of atrial fibrillation Over 10 years ago, maintained on low dose amiodarone ( Held earlier in 2013)  presents for routine followup. history of low testosterone .  Overall he has been feeling well. He does not do exercise on a regular basis. He does have erectile dysfunction which has been there since his prostate surgery. He is concerned about hemoglobin A1c of 6.0 . Some problems with constipation. Last testosterone level 564.   previous  total cholesterol 150, LDL 83. He has been cutting his Vytorin in half.  EKG shows sinus bradycardia with rate 60 beats per minute, no significant ST or T wave changes   Outpatient Encounter Prescriptions as of 12/19/2011  Medication Sig Dispense Refill  . ANDROGEL PUMP 20.25 MG/ACT (1.62%) GEL       . aspirin 81 MG EC tablet Take 81 mg by mouth daily.        . cetirizine (ZYRTEC) 10 MG tablet Take 10 mg by mouth as needed.       . clonazePAM (KLONOPIN) 0.5 MG tablet Take 0.5 mg by mouth as needed.      . clopidogrel (PLAVIX) 75 MG tablet Take 1 tablet (75 mg total) by mouth daily.  30 tablet  11  . ezetimibe-simvastatin (VYTORIN) 10-20 MG per tablet Take 0.5 tablets by mouth at bedtime.      [name] MAGNESIUM-POTASSIUM PO Take by mouth 2 (two) times daily.      . nitroGLYCERIN (NITROSTAT) 0.4 MG SL tablet Place 0.4 mg under the tongue every 5 (five) minutes as needed.        . polyethylene glycol powder (GLYCOLAX/MIRALAX) powder TAKE 17 G BY MOUTH AS DIRECTED.  527 g  0    Review of Systems  HENT: Negative.   Eyes:  Negative.   Respiratory: Negative.   Cardiovascular: Negative.   Gastrointestinal: Negative.   Musculoskeletal: Negative.   Skin: Negative.   Neurological: Negative.   Hematological: Negative.   All other systems reviewed and are negative.   BP 110/70  Pulse 60  Ht 6\' 1"  (1.854 m)  Wt 170 lb (77.111 kg)  BMI 22.43 kg/m2  Physical Exam  Nursing note and vitals reviewed. Constitutional: He is oriented to person, place, and time. He appears well-developed and well-nourished.  HENT:  Head: Normocephalic.  Nose: Nose normal.  Mouth/Throat: Oropharynx is clear and moist.  Eyes: Conjunctivae normal are normal. Pupils are equal, round, and reactive to light.  Neck: Normal range of motion. Neck supple. No JVD present.  Cardiovascular: Normal rate, regular rhythm, S1 normal, S2 normal, normal heart sounds and intact distal pulses.  Exam reveals no gallop and no friction rub.   No murmur heard. Pulmonary/Chest: Effort normal and breath sounds normal. No respiratory distress. He has no wheezes. He has no rales. He exhibits no tenderness.  Abdominal: Soft. Bowel sounds are normal. He exhibits no distension. There is no tenderness.  Musculoskeletal: Normal range of motion. He exhibits no edema and no tenderness.  Lymphadenopathy:    He has  no cervical adenopathy.  Neurological: He is alert and oriented to person, place, and time. Coordination normal.  Skin: Skin is warm and dry. No rash noted. No erythema.  Psychiatric: He has a normal mood and affect. His behavior is normal. Judgment and thought content normal.           Assessment and Plan

## 2011-12-19 NOTE — Assessment & Plan Note (Signed)
Currently with no symptoms of angina. No further workup at this time. Continue current medication regimen. 

## 2011-12-19 NOTE — Patient Instructions (Addendum)
You are doing well. No medication changes were made.  Try MOM or lactulose for constipation  Please call us if you have new issues that need to be addressed before your next appt.  Your physician wants you to follow-up in: 6 months.  You will receive a reminder letter in the mail two months in advance. If you don't receive a letter, please call our office to schedule the follow-up appointment.

## 2011-12-19 NOTE — Assessment & Plan Note (Signed)
Cholesterol is at goal on the current lipid regimen. No changes to the medications were made.  

## 2011-12-29 ENCOUNTER — Telehealth: Payer: Self-pay | Admitting: Internal Medicine

## 2011-12-29 MED ORDER — CLOPIDOGREL BISULFATE 75 MG PO TABS: 75.0000 mg | ORAL_TABLET | Freq: Every day | ORAL | Status: DC

## 2011-12-29 NOTE — Telephone Encounter (Signed)
Med filled.  

## 2011-12-29 NOTE — Telephone Encounter (Signed)
Patient stopped by and asked that we call in his clopidogrel 75 mg tablet Sig: take one tablet by mouth daily He would like this called into [facility] instead of [facility].

## 2012-01-22 ENCOUNTER — Emergency Department: Payer: Self-pay | Admitting: Internal Medicine

## 2012-01-22 LAB — CBC
HCT: 42.5 % (ref 40.0–52.0)
MCHC: 33.7 g/dL (ref 32.0–36.0)
RDW: 13.6 % (ref 11.5–14.5)
WBC: 6.1 10*3/uL (ref 3.8–10.6)

## 2012-01-22 LAB — COMPREHENSIVE METABOLIC PANEL
Anion Gap: 8 (ref 7–16)
BUN: 23 mg/dL — ABNORMAL HIGH (ref 7–18)
Bilirubin,Total: 0.7 mg/dL (ref 0.2–1.0)
Chloride: 106 mmol/L (ref 98–107)
Creatinine: 1.26 mg/dL (ref 0.60–1.30)
EGFR (African American): >60
Glucose: 184 mg/dL — ABNORMAL HIGH (ref 65–99)
SGOT(AST): 21 U/L (ref 15–37)
SGPT (ALT): 22 U/L (ref 12–78)
Sodium: 138 mmol/L (ref 136–145)
Total Protein: 7.1 g/dL (ref 6.4–8.2)

## 2012-01-22 LAB — TROPONIN I: Troponin-I: <0.02 ng/mL

## 2012-01-23 ENCOUNTER — Other Ambulatory Visit: Payer: Self-pay

## 2012-01-23 ENCOUNTER — Telehealth: Payer: Self-pay

## 2012-01-23 ENCOUNTER — Ambulatory Visit (INDEPENDENT_AMBULATORY_CARE_PROVIDER_SITE_OTHER): Payer: Medicare Other

## 2012-01-23 VITALS — BP 110/60 | HR 87 | Ht 73.0 in | Wt 172.5 lb

## 2012-01-23 DIAGNOSIS — I4891 Unspecified atrial fibrillation: Secondary | ICD-10-CM

## 2012-01-23 MED ORDER — DABIGATRAN ETEXILATE MESYLATE 150 MG PO CAPS: 150.0000 mg | ORAL_CAPSULE | Freq: Two times a day (BID) | ORAL | Status: DC

## 2012-01-23 NOTE — Telephone Encounter (Signed)
Message copied by [facility], Josefina Rynders E on Mon Jan 23, 2012  8:06 AM ------      Message from: [place] E      Created: Mon Jan 23, 2012  8:02 AM      Regarding: Orders from [facility] this morning       Per Mariah Milling this morning patient went to ER over the weekend and was in afib for 3 days or so.  Please call patient to come by and get samples of Pradaxa 150mg  twice a day.  This will be a new start for patient on blood thinner.  Also will need followup with Dr. Mariah Milling after starting pradaxa

## 2012-01-23 NOTE — Progress Notes (Signed)
Pt was in ER over w/e for atrial fib. He was told by Dr. Mariah Milling to start Pradaxa 150 mg BID. He was told to pick up samples this am. He also wanted to know what his BP and HR were. Says he was prescribed diltiazem 30 mg to take PRN HR >100 or SBP>120 by Dr. Mariah Milling in ER. BP and HR are well within normal limits this am He is asymptomatic Still in atrial fib He will start pradaxa as prescribed He asks if ok to take while taking Plavix. I advised him to resume PLavix since he has hx stents x 5 He confirms appt with Ward Givens this week He was educated on increased risk of bleeding while taking both Pradaxa and Plavix and was instructed to call us should he notice blood in urine, stool, or coughing up blood, etc. Understanding verb.

## 2012-01-23 NOTE — Telephone Encounter (Signed)
Pt informed samples left at [facility]. Pt wants to see Dr. Mariah Milling today but I explained this would not be necessary unless he is having symptoms. He is asymptomatic but just wants to know what his HR and BP are. I advised him to come in for BP/HR check. He will come in this am and pick up samples as well.   appt made with Ward Givens for ER f/u this Wednesday at 0900.

## 2012-01-24 ENCOUNTER — Telehealth: Payer: Self-pay | Admitting: Internal Medicine

## 2012-01-24 NOTE — Telephone Encounter (Signed)
Received notes that pt was seen in [facility] ED on 1/12. Was he admitted? If yes, has he been discharged?

## 2012-01-24 NOTE — Telephone Encounter (Signed)
Patient advised via telephone, appt scheduled for 02/08/2012 at 9:45, 30 min appt.

## 2012-01-24 NOTE — Telephone Encounter (Signed)
Spoke with patient and he stated that he was seen in the ED but not admitted for afib.  He went to Dr. Windell Hummingbird office but was only seen by the nurse.  He had an ekg done at their office but doesn't have an appt with Dr. Mariah Milling, he would like to see you.  Please advise.

## 2012-01-24 NOTE — Telephone Encounter (Signed)
OK. Lets have him follow up in next slot.

## 2012-01-25 ENCOUNTER — Ambulatory Visit (INDEPENDENT_AMBULATORY_CARE_PROVIDER_SITE_OTHER): Payer: Medicare Other | Admitting: Nurse Practitioner

## 2012-01-25 ENCOUNTER — Encounter: Payer: Self-pay | Admitting: Nurse Practitioner

## 2012-01-25 VITALS — BP 132/68 | HR 57 | Ht 73.0 in | Wt 173.5 lb

## 2012-01-25 DIAGNOSIS — E785 Hyperlipidemia, unspecified: Secondary | ICD-10-CM

## 2012-01-25 DIAGNOSIS — R0789 Other chest pain: Secondary | ICD-10-CM

## 2012-01-25 DIAGNOSIS — I4891 Unspecified atrial fibrillation: Secondary | ICD-10-CM

## 2012-01-25 DIAGNOSIS — I10 Essential (primary) hypertension: Secondary | ICD-10-CM

## 2012-01-25 MED ORDER — DABIGATRAN ETEXILATE MESYLATE 150 MG PO CAPS: 150.0000 mg | ORAL_CAPSULE | Freq: Two times a day (BID) | ORAL | Status: DC

## 2012-01-25 NOTE — Progress Notes (Signed)
Patient Name: [name] Date of Encounter: 01/25/2012  Primary Care Provider:  Shelia Media, MD Primary Cardiologist:  Concha Se, MD  Patient Profile  79 year old male with history of paroxysmal atrial fibrillation who presents for followup after recent ER visit secondary to atrial fibrillation and chest discomfort.  Problem List   Past Medical History  Diagnosis Date  . Coronary artery disease     a. 1993 s/p PCI/BMS LAD;  b. 2003 PCI RCA;  c. 09/2006 ISR LAD->PCI, PCI RCA;  d. 02/2007 MV: no ischemia  . Hyperlipidemia   . Hypertension   . Hyperthyroidism   . PAF (paroxysmal atrial fibrillation)     a. previously on amio ->stopped in 2013;  b. recurrent PAF 01/2012  . Low testosterone   . History of prostate cancer    Past Surgical History  Procedure Date  . Cardiac catheterization 2008  . Prostatectomy   . Hernia repair     s/p mesh bilaterally, Dr. Lemar Livings  . Appendectomy     Dr. Lemar Livings  . Colonoscopy 2013   Allergies  Allergies  Allergen Reactions  . Ambien (Zolpidem Tartrate)   . Statins     REACTION: muscle aches...crestor  . Zolpidem Tartrate    HPI  79 year old male with the above problem list. Late last week, he began to experience intermittent mild retrosternal chest discomfort associated with dyspnea and diaphoresis. He said it was not nearly as intense as prior angina. When he felt to sluggish to go church on Sunday, his wife took him to the [facility] ER where he was found to be in atrial fibrillation with a rapid ventricular response and a rate of 116 by ECG. ECG also showed anterolateral and inferior ST depression. He was able to be rate controlled in ER and after discussion with cardiology, he was prescribed diltiazem to be taken when necessary for elevated heart rates and also pradaxa.  Pt picked up on Monday and has been taking it twice a day in addition to aspirin and Plavix which she has been on chronically.   patient reports that all winter  long, he has had mild epistaxis in the setting of chronic sinus congestion and poor humidification in his home. He says that he does have a home humidifier on his heating system however he believes that the maintenance crew turned off this past fall and he is going to have him come back out to the house to reevaluate. This was present prior to taking pradaxa and has not worsened any.  Since Monday, he has had resolution of chest discomfort, dyspnea, and weakness. He is in sinus rhythm today. He was not able to feel that he was in atrial fibrillation over the weekend.   Home Medications  Prior to Admission medications   Medication Sig Start Date End Date Taking? Authorizing Provider  ANDROGEL PUMP 20.25 MG/ACT (1.62%) GEL  10/07/11  Yes Historical Provider, MD  clopidogrel (PLAVIX) 75 MG tablet Take 1 tablet (75 mg total) by mouth daily. 12/29/11  Yes Shelia Media, MD  dabigatran (PRADAXA) 150 MG CAPS Take 150 mg by mouth every 12 (twelve) hours.  01/23/12  Yes Antonieta Iba, MD  diltiazem (CARDIZEM) 30 MG tablet Take 30 mg by mouth 4 (four) times daily as needed. Takes as needed for elevated HR.   Yes Historical Provider, MD  ezetimibe-simvastatin (VYTORIN) 10-20 MG per tablet Take 0.5 tablets by mouth at bedtime.   Yes Historical Provider, MD  MAGNESIUM-POTASSIUM PO Take by  mouth 2 (two) times daily.   Yes Historical Provider, MD  nitroGLYCERIN (NITROSTAT) 0.4 MG SL tablet Place 0.4 mg under the tongue every 5 (five) minutes as needed.     Yes Historical Provider, MD  polyethylene glycol powder (GLYCOLAX/MIRALAX) powder TAKE 17 G BY MOUTH AS DIRECTED. 11/14/11  Yes Shelia Media, MD  dabigatran (PRADAXA) 150 MG CAPS Take 1 capsule (150 mg total) by mouth every 12 (twelve) hours. 01/25/12   Ok Anis, NP   Review of Systems  He's had mild epistaxis all winter which he attributes to dry air in his home and chronic sinus congestion.  He's had no chest pain, sob, palpitations since  Monday. All other systems reviewed and are otherwise negative except as noted above.  Physical Exam  Blood pressure 132/68, pulse 57, height 6\' 1"  (1.854 m), weight 173 lb 8 oz (78.699 kg).  General: Pleasant, NAD Psych: Normal affect. Neuro: Alert and oriented X 3. Moves all extremities spontaneously. HEENT: Normal  Neck: Supple without bruits or JVD. Lungs:  Resp regular and unlabored, CTA. Heart: RRR no s3, s4, or murmurs. Abdomen: Soft, non-tender, non-distended, BS + x 4.  Extremities: No clubbing, cyanosis or edema. DP/PT/Radials 2+ and equal bilaterally.  Accessory Clinical Findings  ECG - sb, 57, no acute st/t changes.  Assessment & Plan  1.   Paroxysmal atrial fibrillation: Patient is in sinus rhythm today. He's been feeling better over the past 2 days. CHA2DS2 VASc= 4.  In the ER his basic metabolic panel is normal. I recommended we check a TSH today however he prefers to have this checked with his primary care provider at the end of the month. He did not have an echo in our system and he believes has been many many years since she's had an echo therefore we will obtain to evaluate LV function, and assess chamber sizes and valves. As noted above, he has been having mild epistaxis in the setting of heating his home with an adequate humidification. This has not worsened any since initiation of pradaxa.  He is having his humidification system looked at. As he is on chronic Plavix I recommended that he discontinue baby aspirin so long as he is taking pradaxa. He is taking diltiazem when necessary (he has not had to take it) and is not currently interested in taking once a day, long acting diltiazem.  2. Chest discomfort/coronary artery disease:  Prior to his ER visit, he had a three-day history of intermittent chest discomfort with associated dyspnea and diaphoresis. This was found to be in the setting of atrial fibrillation though interestingly had no palpitations. He did have more  pronounced inferior and anterolateral ST depression on ECT while in A. fib though cardiac markers were normal. It's been multiple years since his last stress test and we will arrange for a lexiscan Myoview to rule out ischemia. He remains on statin and Plavix therapy. As above, in the setting of the addition of pradaxa, I have discontinued aspirin.   3.  HTN:  Stable.  4.  HL:  On vytorin, which he is tolerating.  Followed by PCP.  5. Epistaxis:  Mild and present all winter long in the setting of chronic sinus congestion and poor humidification in his house.  He is having his heating/humidification system looked into and is aware that if bleeding worsens that he is to seek evaluation given ongoing anticoagulation.  6.  Dispo:  F/u Dr. Mariah Milling in 1 month.  Nicolasa Ducking, NP  01/25/2012, 9:28 AM

## 2012-01-25 NOTE — Patient Instructions (Addendum)
Your physician has requested that you have an echocardiogram. Echocardiography is a painless test that uses sound waves to create images of your heart. It provides your doctor with information about the size and shape of your heart and how well your heart's chambers and valves are working. This procedure takes approximately one hour. There are no restrictions for this procedure.  Your physician has recommended you make the following change in your medication:  -stop Aspirin -Start Pradaxa 150 mg twice daily  We will schedule lexiscan myoview at [facility]  Your physician wants you to follow-up in: 1 month with Dr. Mariah Milling. You will receive a reminder letter in the mail two months in advance. If you don't receive a letter, please call our office to schedule the follow-up appointment.

## 2012-01-27 ENCOUNTER — Telehealth: Payer: Self-pay

## 2012-01-27 NOTE — Telephone Encounter (Signed)
Message copied by Festus Aloe on Fri Jan 27, 2012  8:35 AM ------      Message from: Britt Bolognese      Created: Morey Hummingbird Jan 26, 2012  4:04 PM       GEXB#[id number] EXP 02-24-12      ----- Message -----         From: Festus Aloe, CMA         Sent: 01/25/2012   9:38 AM           To: Donne Hazel Pre Cert / Marylyn Ishihara Myoview      [facility]      January 31, 2012       Dr. Mariah Milling            DX: A-Fib      Shortness of breath      CAD            [employer] [facility]

## 2012-01-27 NOTE — Telephone Encounter (Signed)
Auth# [id number] Exp. 02/24/2012

## 2012-01-31 ENCOUNTER — Ambulatory Visit: Payer: Self-pay | Admitting: Cardiovascular Disease

## 2012-01-31 DIAGNOSIS — R0602 Shortness of breath: Secondary | ICD-10-CM

## 2012-02-02 ENCOUNTER — Other Ambulatory Visit: Payer: Self-pay | Admitting: *Deleted

## 2012-02-02 DIAGNOSIS — I4891 Unspecified atrial fibrillation: Secondary | ICD-10-CM

## 2012-02-02 DIAGNOSIS — R0789 Other chest pain: Secondary | ICD-10-CM

## 2012-02-08 ENCOUNTER — Ambulatory Visit (INDEPENDENT_AMBULATORY_CARE_PROVIDER_SITE_OTHER): Payer: Medicare Other | Admitting: Internal Medicine

## 2012-02-08 ENCOUNTER — Encounter: Payer: Self-pay | Admitting: Internal Medicine

## 2012-02-08 VITALS — BP 122/84 | HR 84 | Temp 97.7°F | Ht 73.0 in | Wt 171.8 lb

## 2012-02-08 DIAGNOSIS — I4891 Unspecified atrial fibrillation: Secondary | ICD-10-CM

## 2012-02-08 MED ORDER — AMIODARONE HCL 200 MG PO TABS: 200.0000 mg | ORAL_TABLET | Freq: Every day | ORAL | Status: DC

## 2012-02-08 MED ORDER — METOPROLOL TARTRATE 12.5 MG HALF TABLET: 12.5000 mg | ORAL_TABLET | Freq: Two times a day (BID) | ORAL | Status: DC

## 2012-02-08 MED ORDER — ASPIRIN 81 MG PO TBEC: 81.0000 mg | DELAYED_RELEASE_TABLET | Freq: Every day | ORAL | Status: DC

## 2012-02-08 NOTE — Progress Notes (Signed)
Subjective:    Patient ID: [name], male    DOB: 1933-01-06, 79 y.o.   MRN: [id number]  HPI 79YO male with h/o afib presents for follow up. Was seen in [place] ED 2 weeks ago, with AFIB with RVR. Started on pradaxa, then checked by cardiologist, had converted to NSR, so pradaxa stopped. Had myoview which was normal.  Complains of feeling lightheaded today, no palpitations, chest pain, dyspnea. No other concerns. Has not taken Diltiazem today.  Outpatient Encounter Prescriptions as of 02/08/2012  Medication Sig Dispense Refill  . ANDROGEL PUMP 20.25 MG/ACT (1.62%) GEL       . clopidogrel (PLAVIX) 75 MG tablet Take 1 tablet (75 mg total) by mouth daily.  30 tablet  11  . ezetimibe-simvastatin (VYTORIN) 10-20 MG per tablet Take 0.5 tablets by mouth at bedtime.      . nitroGLYCERIN (NITROSTAT) 0.4 MG SL tablet Place 0.4 mg under the tongue every 5 (five) minutes as needed.        . polyethylene glycol powder (GLYCOLAX/MIRALAX) powder TAKE 17 G BY MOUTH AS DIRECTED.  527 g  0  . amiodarone (PACERONE) 200 MG tablet Take 1 tablet (200 mg total) by mouth daily.  30 tablet  6  . aspirin 81 MG EC tablet Take 1 tablet (81 mg total) by mouth daily. Swallow whole.  30 tablet  12  . diltiazem (CARDIZEM) 30 MG tablet Take 30 mg by mouth 4 (four) times daily as needed. Takes as needed for elevated HR.      [name] MAGNESIUM-POTASSIUM PO Take by mouth 2 (two) times daily.      . metoprolol tartrate (LOPRESSOR) 12.5 mg TABS Take 0.5 tablets (12.5 mg total) by mouth 2 (two) times daily.  60 tablet  6   BP 122/84  Pulse 84  Temp 97.7 F (36.5 C) (Oral)  Ht 6\' 1"  (1.854 m)  Wt 171 lb 12 oz (77.905 kg)  BMI 22.66 kg/m2  SpO2 97%  Review of Systems  Constitutional: Negative for fever, chills, activity change, appetite change, fatigue and unexpected weight change.  Eyes: Negative for visual disturbance.  Respiratory: Negative for cough and shortness of breath.   Cardiovascular: Negative for chest pain,  palpitations and leg swelling.  Gastrointestinal: Negative for abdominal pain and abdominal distention.  Genitourinary: Negative for dysuria, urgency and difficulty urinating.  Musculoskeletal: Negative for arthralgias and gait problem.  Skin: Negative for color change and rash.  Neurological: Positive for light-headedness. Negative for tremors, syncope, speech difficulty and weakness.  Hematological: Negative for adenopathy.  Psychiatric/Behavioral: Negative for sleep disturbance and dysphoric mood. The patient is not nervous/anxious.        Objective:   Physical Exam  Constitutional: He is oriented to person, place, and time. He appears well-developed and well-nourished. No distress.  HENT:  Head: Normocephalic and atraumatic.  Right Ear: External ear normal.  Left Ear: External ear normal.  Nose: Nose normal.  Mouth/Throat: Oropharynx is clear and moist. No oropharyngeal exudate.  Eyes: Conjunctivae normal and EOM are normal. Pupils are equal, round, and reactive to light. Right eye exhibits no discharge. Left eye exhibits no discharge. No scleral icterus.  Neck: Normal range of motion. Neck supple. No tracheal deviation present. No thyromegaly present.  Cardiovascular: Normal rate and normal heart sounds.  An irregularly irregular rhythm present. Exam reveals no gallop and no friction rub.   No murmur heard. Pulmonary/Chest: Effort normal and breath sounds normal. No respiratory distress. He has no wheezes. He has  no rales. He exhibits no tenderness.  Musculoskeletal: Normal range of motion. He exhibits no edema.  Lymphadenopathy:    He has no cervical adenopathy.  Neurological: He is alert and oriented to person, place, and time. No cranial nerve deficit. Coordination normal.  Skin: Skin is warm and dry. No rash noted. He is not diaphoretic. No erythema. No pallor.  Psychiatric: He has a normal mood and affect. His behavior is normal. Judgment and thought content normal.           Assessment & Plan:

## 2012-02-08 NOTE — Patient Instructions (Signed)
Start Amiodarone 400mg  today. Recheck heart rate before bedtime. If heart rate regular, then start 200mg  Amiodarone daily tomorrow morning. If heart rate irregular, then take a second dose of 400mg  Amiodarone, and then start the Amiodarone 200mg  daily tomorrow.  Start Pradaxa 150mg  twice daily.  Start Metoprolol 12.5 mg twice daily.  Dr. Windell Hummingbird office will call you for an appointment tomorrow.

## 2012-02-08 NOTE — Assessment & Plan Note (Signed)
Afib noted on exam today. Pt symptomatic, with lightheadedness but no palpitations or chest pain. Discussed with pt cardiologist. Will start Amiodarone 400mg  now and then repeat tonight if not back in NSR. Will then continue Amiodarone 200mg  daily. Will add Metoprolol 12.5mg  po bid. Will restart Pradaxa. Pt will follow up with cardiology later this week. He will call immediately or go to the ED if any chest pain, dyspnea, or palpitations.

## 2012-02-09 ENCOUNTER — Encounter: Payer: Self-pay | Admitting: Cardiovascular Disease

## 2012-02-09 ENCOUNTER — Ambulatory Visit (INDEPENDENT_AMBULATORY_CARE_PROVIDER_SITE_OTHER): Payer: Medicare Other | Admitting: Cardiovascular Disease

## 2012-02-09 VITALS — BP 120/70 | HR 64 | Ht 73.0 in | Wt 173.5 lb

## 2012-02-09 DIAGNOSIS — E785 Hyperlipidemia, unspecified: Secondary | ICD-10-CM

## 2012-02-09 DIAGNOSIS — I4891 Unspecified atrial fibrillation: Secondary | ICD-10-CM

## 2012-02-09 DIAGNOSIS — I10 Essential (primary) hypertension: Secondary | ICD-10-CM

## 2012-02-09 DIAGNOSIS — I2510 Atherosclerotic heart disease of native coronary artery without angina pectoris: Secondary | ICD-10-CM

## 2012-02-09 NOTE — Assessment & Plan Note (Signed)
Converted back to normal sinus rhythm today. Several episodes this month. We have suggested he stay on amiodarone 200 mg daily, start metoprolol tartrate 12.5 mg twice a day. He is reluctant to stay on anticoagulation given the cost. We have given him some ideas on how to closely monitor his heart rhythm. He does not want a 2 day monitor at this time. He recently blood pressure cuff and a pulse oximeter and self measuring 2 check his pulse daily. He will check his pulse for any dizziness or lightheadedness. If he converts back to atrial fibrillation, we would likely need a longer amiodarone load and would have him take 200 mg twice a day for several weeks.

## 2012-02-09 NOTE — Assessment & Plan Note (Signed)
Recent negative stress test January 2014. Continue current medications, aggressive lipid management.

## 2012-02-09 NOTE — Progress Notes (Signed)
Patient ID: [name], male    DOB: 05/25/1932, 79 y.o.   MRN: [id number]  HPI Comments: [name] is a very pleasant 79 year old gentleman with a history of coronary artery disease, stent placed to his LAD in 1993, stent placed to his mid RCA in 2003, in-stent restenosis with repeat stent placed to his LAD in September 08, stent placed to his RCA at the same time, stress test in February 2009 which showed no ischemia, also with history of atrial fibrillation, maintained on low dose amiodarone ( Held earlier in 2013)  presents for routine followup. history of low testosterone .  He had an episode of major fibrillation several weeks ago, Dorene Sorrow 2014 and presented to the emergency room. Converted back to normal sinus rhythm. Discharged on pradaxa. In followup to the clinic, he was in normal sinus rhythm. He has taken himself off amiodarone, diltiazem and was doing well until 2 days ago.  2 days ago he developed dizziness, lightheadedness in the afternoon. He presented to primary care the next day. EKG showed atrial fibrillation. We discussed his case with Dr. walker and he took amiodarone 400 mg x1 with repeat dose that evening, also started on low-dose metoprolol but the pharmacy did not have this for pickup. This morning he has some head fullness but otherwise less dizziness and is back in normal sinus rhythm. He's not eager to continue on anticoagulation but is willing to continue on amiodarone and low-dose beta blocker.  Apart from head fullness, he feels well today.   previous  total cholesterol 150, LDL 83.  EKG shows sinus rhythm with rate 64 beats per minute, no significant ST or T wave changes   Outpatient Encounter Prescriptions as of 02/09/2012  Medication Sig Dispense Refill  . amiodarone (PACERONE) 200 MG tablet Take 400 mg by mouth 2 (two) times daily.      . ANDROGEL PUMP 20.25 MG/ACT (1.62%) GEL       . clopidogrel (PLAVIX) 75 MG tablet Take 1 tablet (75 mg total) by mouth daily.   30 tablet  11  . dabigatran (PRADAXA) 150 MG CAPS Take 150 mg by mouth every 12 (twelve) hours.      [name] ezetimibe-simvastatin (VYTORIN) 10-20 MG per tablet Take 0.5 tablets by mouth at bedtime.      . nitroGLYCERIN (NITROSTAT) 0.4 MG SL tablet Place 0.4 mg under the tongue every 5 (five) minutes as needed.        . polyethylene glycol powder (GLYCOLAX/MIRALAX) powder TAKE 17 G BY MOUTH AS DIRECTED.  527 g  0  . [DISCONTINUED] amiodarone (PACERONE) 200 MG tablet Take 1 tablet (200 mg total) by mouth daily.  30 tablet  6  . aspirin 81 MG EC tablet Take 1 tablet (81 mg total) by mouth daily. Swallow whole.  30 tablet  12  . diltiazem (CARDIZEM) 30 MG tablet Take 30 mg by mouth 4 (four) times daily as needed. Takes as needed for elevated HR.      . metoprolol tartrate (LOPRESSOR) 12.5 mg TABS Take 0.5 tablets (12.5 mg total) by mouth 2 (two) times daily.  60 tablet  6  . [DISCONTINUED] MAGNESIUM-POTASSIUM PO Take by mouth 2 (two) times daily.        Review of Systems  Constitutional: Negative.   HENT: Negative.   Eyes: Negative.   Respiratory: Negative.   Cardiovascular: Negative.   Gastrointestinal: Negative.   Musculoskeletal: Negative.   Skin: Negative.   Neurological: Negative.   Hematological: Negative.  Psychiatric/Behavioral: Negative.   All other systems reviewed and are negative.   BP 120/70  Pulse 64  Ht 6\' 1"  (1.854 m)  Wt 173 lb 8 oz (78.699 kg)  BMI 22.89 kg/m2  Physical Exam  Nursing note and vitals reviewed. Constitutional: He is oriented to person, place, and time. He appears well-developed and well-nourished.  HENT:  Head: Normocephalic.  Nose: Nose normal.  Mouth/Throat: Oropharynx is clear and moist.  Eyes: Conjunctivae normal are normal. Pupils are equal, round, and reactive to light.  Neck: Normal range of motion. Neck supple. No JVD present.  Cardiovascular: Normal rate, regular rhythm, S1 normal, S2 normal, normal heart sounds and intact distal pulses.   Exam reveals no gallop and no friction rub.   No murmur heard. Pulmonary/Chest: Effort normal and breath sounds normal. No respiratory distress. He has no wheezes. He has no rales. He exhibits no tenderness.  Abdominal: Soft. Bowel sounds are normal. He exhibits no distension. There is no tenderness.  Musculoskeletal: Normal range of motion. He exhibits no edema and no tenderness.  Lymphadenopathy:    He has no cervical adenopathy.  Neurological: He is alert and oriented to person, place, and time. Coordination normal.  Skin: Skin is warm and dry. No rash noted. No erythema.  Psychiatric: He has a normal mood and affect. His behavior is normal. Judgment and thought content normal.           Assessment and Plan

## 2012-02-09 NOTE — Assessment & Plan Note (Signed)
Cholesterol is at goal on the current lipid regimen. No changes to the medications were made.  

## 2012-02-09 NOTE — Assessment & Plan Note (Signed)
Blood pressure is well controlled on today's visit. We will add low-dose beta blocker for rhythm control

## 2012-02-09 NOTE — Patient Instructions (Addendum)
You are doing well. Please continue amiodarone 200 mg once a day Start metoprolol 12.5 mg twice a day   Take pradaxa for any atrial fibrillation Call if you would like a holter  Please call us if you have new issues that need to be addressed before your next appt.  Your physician wants you to follow-up in: 3 months.  You will receive a reminder letter in the mail two months in advance. If you don't receive a letter, please call our office to schedule the follow-up appointment.

## 2012-02-13 ENCOUNTER — Other Ambulatory Visit: Payer: Self-pay

## 2012-02-13 ENCOUNTER — Other Ambulatory Visit (INDEPENDENT_AMBULATORY_CARE_PROVIDER_SITE_OTHER): Payer: Medicare Other

## 2012-02-13 DIAGNOSIS — R0789 Other chest pain: Secondary | ICD-10-CM

## 2012-02-13 DIAGNOSIS — I4891 Unspecified atrial fibrillation: Secondary | ICD-10-CM

## 2012-02-13 DIAGNOSIS — I359 Nonrheumatic aortic valve disorder, unspecified: Secondary | ICD-10-CM

## 2012-02-17 ENCOUNTER — Telehealth: Payer: Self-pay

## 2012-02-17 ENCOUNTER — Emergency Department: Payer: Self-pay | Admitting: Emergency Medicine

## 2012-02-17 LAB — COMPREHENSIVE METABOLIC PANEL
Alkaline Phosphatase: 124 U/L (ref 50–136)
Anion Gap: 7 (ref 7–16)
BUN: 19 mg/dL — ABNORMAL HIGH (ref 7–18)
Bilirubin,Total: 1.7 mg/dL — ABNORMAL HIGH (ref 0.2–1.0)
Chloride: 108 mmol/L — ABNORMAL HIGH (ref 98–107)
Co2: 22 mmol/L (ref 21–32)
Creatinine: 1.09 mg/dL (ref 0.60–1.30)
EGFR (African American): >60
EGFR (Non-African Amer.): >60
SGOT(AST): 16 U/L (ref 15–37)
SGPT (ALT): 17 U/L (ref 12–78)
Total Protein: 7.6 g/dL (ref 6.4–8.2)

## 2012-02-17 LAB — CBC
HCT: 43.1 % (ref 40.0–52.0)
HGB: 14.4 g/dL (ref 13.0–18.0)
MCH: 28.2 pg (ref 26.0–34.0)
MCHC: 33.5 g/dL (ref 32.0–36.0)
MCV: 84 fL (ref 80–100)
Platelet: 197 10*3/uL (ref 150–440)
RBC: 5.1 10*6/uL (ref 4.40–5.90)
WBC: 15.2 10*3/uL — ABNORMAL HIGH (ref 3.8–10.6)

## 2012-02-17 LAB — MAGNESIUM: Magnesium: 1.9 mg/dL

## 2012-02-17 NOTE — Telephone Encounter (Signed)
AndroGel should be okay to use

## 2012-02-17 NOTE — Telephone Encounter (Signed)
Pt informed about echo He says he is feeling well since ER d/c Says he is still in NSR He asks why is has paroxysmal atrial fib I explained I am unsure and he should discuss this with MD further I asked him if he was going to keep appt with [place] 2/17 He was unaware of this appt and thinks it was an "old" appt and wishes to f/u in 3 months as Dr. Mariah Milling suggested at 1/30 OV  He asks if I will ask Dr. Mariah Milling if androgel can affect atrial fib. I told him I would ask and call him back Understanding verb

## 2012-02-17 NOTE — Telephone Encounter (Signed)
Pt went to ER today for atrial fib, but converted back to NSR by the time he got there. "Call patient and tell him his echocardiogram was normal. Call to assess symptoms since coming home from ER" VO Dr. Adline Mango, RN  [facility]

## 2012-02-20 NOTE — Telephone Encounter (Signed)
Pt informed Understanding verb 

## 2012-02-21 ENCOUNTER — Other Ambulatory Visit: Payer: Medicare Other

## 2012-02-27 ENCOUNTER — Ambulatory Visit: Payer: Medicare Other | Admitting: Cardiovascular Disease

## 2012-04-03 ENCOUNTER — Ambulatory Visit (INDEPENDENT_AMBULATORY_CARE_PROVIDER_SITE_OTHER): Payer: Medicare Other | Admitting: Internal Medicine

## 2012-04-03 VITALS — BP 150/80 | HR 54 | Temp 97.7°F | Wt 173.0 lb

## 2012-04-03 DIAGNOSIS — M549 Dorsalgia, unspecified: Secondary | ICD-10-CM

## 2012-04-03 DIAGNOSIS — I10 Essential (primary) hypertension: Secondary | ICD-10-CM

## 2012-04-03 DIAGNOSIS — E785 Hyperlipidemia, unspecified: Secondary | ICD-10-CM

## 2012-04-03 LAB — COMPREHENSIVE METABOLIC PANEL
AST: 22 U/L (ref 0–37)
Alkaline Phosphatase: 98 U/L (ref 39–117)
Glucose, Bld: 109 mg/dL — ABNORMAL HIGH (ref 70–99)
Sodium: 139 mEq/L (ref 135–145)
Total Bilirubin: 1 mg/dL (ref 0.3–1.2)
Total Protein: 7.3 g/dL (ref 6.0–8.3)

## 2012-04-03 LAB — LIPID PANEL
Cholesterol: 139 mg/dL (ref 0–200)
LDL Cholesterol: 77 mg/dL (ref 0–99)
Total CHOL/HDL Ratio: 3
Triglycerides: 89 mg/dL (ref 0.0–149.0)
VLDL: 17.8 mg/dL (ref 0.0–40.0)

## 2012-04-03 MED ORDER — CYCLOBENZAPRINE HCL 5 MG PO TABS: 5.0000 mg | ORAL_TABLET | Freq: Three times a day (TID) | ORAL | Status: DC | PRN

## 2012-04-03 MED ORDER — CELECOXIB 100 MG PO CAPS: 100.0000 mg | ORAL_CAPSULE | Freq: Two times a day (BID) | ORAL | Status: DC

## 2012-04-03 NOTE — Progress Notes (Signed)
Subjective:    Patient ID: [name], male    DOB: 02-Apr-1932, 79 y.o.   MRN: [id number]  HPI 79 year old male with history of hypertension, atrial fibrillation, hyperlipidemia presents for acute visit complaining of 2 week history of right upper back pain. He reports that pain first began after he spent several days sawing logs in his yard. Pain is described as cramping or aching pain in the mid right upper back. It is worsened with movement. It is improved with use of meloxicam. He has been taking his wife's supply of this medication. He has also been using his wife's supply of cyclobenzaprine with some improvement. He denies any shortness of breath, cough, fever, chills, chest pain.  In regards to his chronic medical conditions, he reports he is generally feeling well. He reports compliance with his medications.  Outpatient Encounter Prescriptions as of 04/03/2012  Medication Sig Dispense Refill  . amiodarone (PACERONE) 200 MG tablet Take 400 mg by mouth 2 (two) times daily.      . ANDROGEL PUMP 20.25 MG/ACT (1.62%) GEL       . aspirin 81 MG EC tablet Take 1 tablet (81 mg total) by mouth daily. Swallow whole.  30 tablet  12  . clopidogrel (PLAVIX) 75 MG tablet Take 1 tablet (75 mg total) by mouth daily.  30 tablet  11  . cyclobenzaprine (FLEXERIL) 5 MG tablet Take 5 mg by mouth 2 (two) times daily as needed for muscle spasms.      [name] ezetimibe-simvastatin (VYTORIN) 10-20 MG per tablet Take 0.5 tablets by mouth at bedtime.      . meloxicam (MOBIC) 7.5 MG tablet Take 7.5 mg by mouth daily.      . polyethylene glycol powder (GLYCOLAX/MIRALAX) powder TAKE 17 G BY MOUTH AS DIRECTED.  527 g  0  . celecoxib (CELEBREX) 100 MG capsule Take 1 capsule (100 mg total) by mouth 2 (two) times daily.  60 capsule  3  . dabigatran (PRADAXA) 150 MG CAPS Take 150 mg by mouth every 12 (twelve) hours.      [name] diltiazem (CARDIZEM) 30 MG tablet Take 30 mg by mouth 4 (four) times daily as needed. Takes as needed  for elevated HR.      . metoprolol tartrate (LOPRESSOR) 12.5 mg TABS Take 0.5 tablets (12.5 mg total) by mouth 2 (two) times daily.  60 tablet  6  . nitroGLYCERIN (NITROSTAT) 0.4 MG SL tablet Place 0.4 mg under the tongue every 5 (five) minutes as needed.        . [DISCONTINUED] cyclobenzaprine (FLEXERIL) 5 MG tablet Take 1 tablet (5 mg total) by mouth 3 (three) times daily as needed for muscle spasms.  30 tablet  3   No facility-administered encounter medications on file as of 04/03/2012.   BP 150/80  Pulse 54  Temp(Src) 97.7 F (36.5 C) (Oral)  Wt 173 lb (78.472 kg)  BMI 22.83 kg/m2  SpO2 97%  Review of Systems  Constitutional: Negative for fever, chills, activity change, appetite change, fatigue and unexpected weight change.  Eyes: Negative for visual disturbance.  Respiratory: Negative for cough and shortness of breath.   Cardiovascular: Negative for chest pain, palpitations and leg swelling.  Gastrointestinal: Negative for abdominal pain and abdominal distention.  Genitourinary: Negative for dysuria, urgency and difficulty urinating.  Musculoskeletal: Negative for arthralgias and gait problem.  Skin: Negative for color change and rash.  Hematological: Negative for adenopathy.  Psychiatric/Behavioral: Negative for sleep disturbance and dysphoric mood. The patient is  not nervous/anxious.        Objective:   Physical Exam  Constitutional: He is oriented to person, place, and time. He appears well-developed and well-nourished. No distress.  HENT:  Head: Normocephalic and atraumatic.  Right Ear: External ear normal.  Left Ear: External ear normal.  Nose: Nose normal.  Mouth/Throat: Oropharynx is clear and moist. No oropharyngeal exudate.  Eyes: Conjunctivae and EOM are normal. Pupils are equal, round, and reactive to light. Right eye exhibits no discharge. Left eye exhibits no discharge. No scleral icterus.  Neck: Normal range of motion. Neck supple. No tracheal deviation  present. No thyromegaly present.  Cardiovascular: Normal rate, regular rhythm and normal heart sounds.  Exam reveals no gallop and no friction rub.   No murmur heard. Pulmonary/Chest: Effort normal and breath sounds normal. No respiratory distress. He has no wheezes. He has no rales. He exhibits no tenderness.  Musculoskeletal: Normal range of motion. He exhibits no edema.       Back:  Lymphadenopathy:    He has no cervical adenopathy.  Neurological: He is alert and oriented to person, place, and time. No cranial nerve deficit. Coordination normal.  Skin: Skin is warm and dry. No rash noted. He is not diaphoretic. No erythema. No pallor.  Psychiatric: He has a normal mood and affect. His behavior is normal. Judgment and thought content normal.          Assessment & Plan:

## 2012-04-03 NOTE — Assessment & Plan Note (Signed)
Will check lipids and LFTs with labs today. Continue current medications.

## 2012-04-03 NOTE — Assessment & Plan Note (Signed)
Symptoms and exam are most consistent with muscular strain after cutting several logs. We discussed the potential risk of using nonsteroidal medications at the same time as Plavix. Will change to Celebrex 100 mg twice daily as needed. Will use cyclobenzaprine 5 mg up to 3 times daily for muscle spasm. He will call if symptoms are not improving over the next 72hr.

## 2012-04-03 NOTE — Assessment & Plan Note (Signed)
BP Readings from Last 3 Encounters:  04/03/12 150/80  02/09/12 120/70  02/08/12 122/84   Blood pressure slightly elevated today likely secondary to pain. We'll continue current medications. Followup in 3 weeks.

## 2012-04-09 ENCOUNTER — Other Ambulatory Visit: Payer: Self-pay | Admitting: Internal Medicine

## 2012-04-09 ENCOUNTER — Telehealth: Payer: Self-pay | Admitting: Internal Medicine

## 2012-04-09 DIAGNOSIS — M549 Dorsalgia, unspecified: Secondary | ICD-10-CM

## 2012-04-09 MED ORDER — CYCLOBENZAPRINE HCL 5 MG PO TABS: 5.0000 mg | ORAL_TABLET | Freq: Three times a day (TID) | ORAL | Status: DC | PRN

## 2012-04-09 MED ORDER — CELECOXIB 100 MG PO CAPS: 100.0000 mg | ORAL_CAPSULE | Freq: Two times a day (BID) | ORAL | Status: DC

## 2012-04-09 NOTE — Telephone Encounter (Signed)
Patient came in for prescriptions, they were given to him in his hands.

## 2012-04-09 NOTE — Telephone Encounter (Signed)
error 

## 2012-04-27 ENCOUNTER — Ambulatory Visit: Payer: BC Managed Care – PPO | Admitting: Internal Medicine

## 2012-05-01 ENCOUNTER — Encounter: Payer: Self-pay | Admitting: Internal Medicine

## 2012-05-01 ENCOUNTER — Ambulatory Visit (INDEPENDENT_AMBULATORY_CARE_PROVIDER_SITE_OTHER): Payer: Medicare Other | Admitting: Internal Medicine

## 2012-05-01 VITALS — BP 130/80 | HR 55 | Temp 97.9°F | Wt 169.0 lb

## 2012-05-01 DIAGNOSIS — E291 Testicular hypofunction: Secondary | ICD-10-CM

## 2012-05-01 DIAGNOSIS — I10 Essential (primary) hypertension: Secondary | ICD-10-CM

## 2012-05-01 DIAGNOSIS — I4891 Unspecified atrial fibrillation: Secondary | ICD-10-CM

## 2012-05-01 NOTE — Assessment & Plan Note (Signed)
BP Readings from Last 3 Encounters:  05/01/12 130/80  04/03/12 150/80  02/09/12 120/70   BP has been generally well controlled without metoprolol. Note that pt continues on Amiodarone. Will continue to monitor.

## 2012-05-01 NOTE — Progress Notes (Signed)
Subjective:    Patient ID: [name], male    DOB: [birth year], 79 y.o.   MRN: [id number]  HPI 79 year old male with history of atrial fibrillation, hyperlipidemia, hypertension presents for followup. He reports he is generally feeling well. He continues on amiodarone alone for rhythm control. He has not been taking metoprolol. He notes occasional awareness of his heartbeat especially at night. However, he denies any irregular heartbeat, shortness of breath, chest pain. He denies any new concerns today.  Outpatient Encounter Prescriptions as of 05/01/2012  Medication Sig Dispense Refill  . amiodarone (PACERONE) 200 MG tablet Take 400 mg by mouth 2 (two) times daily.      . ANDROGEL PUMP 20.25 MG/ACT (1.62%) GEL       . aspirin 81 MG EC tablet Take 1 tablet (81 mg total) by mouth daily. Swallow whole.  30 tablet  12  . clopidogrel (PLAVIX) 75 MG tablet Take 1 tablet (75 mg total) by mouth daily.  30 tablet  11  . ezetimibe-simvastatin (VYTORIN) 10-20 MG per tablet Take 0.5 tablets by mouth at bedtime.      . nitroGLYCERIN (NITROSTAT) 0.4 MG SL tablet Place 0.4 mg under the tongue every 5 (five) minutes as needed.        . polyethylene glycol powder (GLYCOLAX/MIRALAX) powder TAKE 17 G BY MOUTH AS DIRECTED.  527 g  0   No facility-administered encounter medications on file as of 05/01/2012.   BP 130/80  Pulse 55  Temp(Src) 97.9 F (36.6 C) (Oral)  Wt 169 lb (76.658 kg)  BMI 22.3 kg/m2  SpO2 97%  Review of Systems  Constitutional: Negative for fever, chills, activity change, appetite change, fatigue and unexpected weight change.  Eyes: Negative for visual disturbance.  Respiratory: Negative for cough and shortness of breath.   Cardiovascular: Negative for chest pain, palpitations and leg swelling.  Gastrointestinal: Negative for abdominal pain and abdominal distention.  Genitourinary: Negative for dysuria, urgency and difficulty urinating.  Musculoskeletal: Negative for arthralgias  and gait problem.  Skin: Negative for color change and rash.  Hematological: Negative for adenopathy.  Psychiatric/Behavioral: Negative for sleep disturbance and dysphoric mood. The patient is not nervous/anxious.        Objective:   Physical Exam  Constitutional: He is oriented to person, place, and time. He appears well-developed and well-nourished. No distress.  HENT:  Head: Normocephalic and atraumatic.  Right Ear: External ear normal.  Left Ear: External ear normal.  Nose: Nose normal.  Mouth/Throat: Oropharynx is clear and moist. No oropharyngeal exudate.  Eyes: Conjunctivae and EOM are normal. Pupils are equal, round, and reactive to light. Right eye exhibits no discharge. Left eye exhibits no discharge. No scleral icterus.  Neck: Normal range of motion. Neck supple. No tracheal deviation present. No thyromegaly present.  Cardiovascular: Normal rate, regular rhythm and normal heart sounds.  Exam reveals no gallop and no friction rub.   No murmur heard. Pulmonary/Chest: Effort normal and breath sounds normal. No respiratory distress. He has no wheezes. He has no rales. He exhibits no tenderness.  Musculoskeletal: Normal range of motion. He exhibits no edema.  Lymphadenopathy:    He has no cervical adenopathy.  Neurological: He is alert and oriented to person, place, and time. No cranial nerve deficit. Coordination normal.  Skin: Skin is warm and dry. No rash noted. He is not diaphoretic. No erythema. No pallor.  Psychiatric: He has a normal mood and affect. His behavior is normal. Judgment and thought content normal.  Assessment & Plan:

## 2012-05-01 NOTE — Assessment & Plan Note (Signed)
Rhythm controlled on amiodarone. Pt is off anticoagulation per his preference because of cost. Will continue to monitor.

## 2012-05-01 NOTE — Assessment & Plan Note (Signed)
Symptoms well controlled with use of Androgel. Discussed potential risks of this medication today.

## 2012-05-03 ENCOUNTER — Other Ambulatory Visit: Payer: Self-pay | Admitting: Internal Medicine

## 2012-05-09 ENCOUNTER — Ambulatory Visit (INDEPENDENT_AMBULATORY_CARE_PROVIDER_SITE_OTHER): Payer: Medicare Other | Admitting: Cardiovascular Disease

## 2012-05-09 ENCOUNTER — Encounter: Payer: Self-pay | Admitting: Cardiovascular Disease

## 2012-05-09 VITALS — BP 114/72 | HR 62 | Ht 73.0 in | Wt 168.8 lb

## 2012-05-09 DIAGNOSIS — E785 Hyperlipidemia, unspecified: Secondary | ICD-10-CM

## 2012-05-09 DIAGNOSIS — I10 Essential (primary) hypertension: Secondary | ICD-10-CM

## 2012-05-09 DIAGNOSIS — I2510 Atherosclerotic heart disease of native coronary artery without angina pectoris: Secondary | ICD-10-CM

## 2012-05-09 DIAGNOSIS — R009 Unspecified abnormalities of heart beat: Secondary | ICD-10-CM

## 2012-05-09 DIAGNOSIS — R012 Other cardiac sounds: Secondary | ICD-10-CM

## 2012-05-09 DIAGNOSIS — I4891 Unspecified atrial fibrillation: Secondary | ICD-10-CM

## 2012-05-09 MED ORDER — EZETIMIBE-SIMVASTATIN 10-20 MG PO TABS: 1.0000 | ORAL_TABLET | Freq: Every day | ORAL | Status: DC

## 2012-05-09 NOTE — Assessment & Plan Note (Signed)
Currently with no symptoms of angina. No further workup at this time. Continue current medication regimen. 

## 2012-05-09 NOTE — Assessment & Plan Note (Signed)
Cholesterol is at goal on the current lipid regimen. No changes to the medications were made.  

## 2012-05-09 NOTE — Patient Instructions (Addendum)
You are doing well.  Ok to cut the amiodarone in 1/2 if you would like to try this If you have episode of atrial fibrillation,  Take two whole amiodarone also take one metoprolol  Please call us if you have new issues that need to be addressed before your next appt.  Your physician wants you to follow-up in: 6 months.  You will receive a reminder letter in the mail two months in advance. If you don't receive a letter, please call our office to schedule the follow-up appointment.

## 2012-05-09 NOTE — Assessment & Plan Note (Signed)
Blood pressure is well controlled on today's visit. No changes made to the medications. 

## 2012-05-09 NOTE — Progress Notes (Signed)
Patient ID: [name], male    DOB: [birth year], 79 y.o.   MRN: [id number]  HPI Comments: [name] is a very pleasant 79 year old gentleman with a history of coronary artery disease, stent placed to his LAD in 1993, stent placed to his mid RCA in 2003, in-stent restenosis with repeat stent placed to his LAD in September 08, stent placed to his RCA at the same time, stress test in February 2009 which showed no ischemia, also with history of atrial fibrillation, maintained on low dose amiodarone ( Held earlier in 2013)  presents for routine followup. history of low testosterone .  January 2014 had an episode of atrial fibrillation and presented to the emergency room. Converted back to normal sinus rhythm. Discharged on pradaxa. In followup to the clinic, he was in normal sinus rhythm. He has taken himself off amiodarone, diltiazem   Recurrent episodes of atrial fibrillation shortly after that, He presented to primary care the next day. EKG showed atrial fibrillation. he took amiodarone 400 mg x1 with repeat dose that evening. This morning he has some head fullness but otherwise less dizziness and is back in normal sinus rhythm. He's not eager to continue on anticoagulation but is willing to continue on amiodarone. He try low-dose beta blocker but had bradycardia and fatigue and he stopped this.  In followup today, he feels well. He has periodic back pain. No recurrent atrial fibrillation episodes. He is interested in cutting his amiodarone dose down to 100 mg daily   previous  total cholesterol 150, LDL 83.  EKG shows sinus rhythm with rate 62 beats per minute, no significant ST or T wave changes   Outpatient Encounter Prescriptions as of 05/09/2012  Medication Sig Dispense Refill  . amiodarone (PACERONE) 200 MG tablet Take 200 mg by mouth daily.       . ANDROGEL PUMP 20.25 MG/ACT (1.62%) GEL       . aspirin 81 MG EC tablet Take 1 tablet (81 mg total) by mouth daily. Swallow whole.  30 tablet   12  . clopidogrel (PLAVIX) 75 MG tablet Take 1 tablet (75 mg total) by mouth daily.  30 tablet  11  . ezetimibe-simvastatin (VYTORIN) 10-20 MG per tablet Take 0.5 tablets by mouth at bedtime.      . nitroGLYCERIN (NITROSTAT) 0.4 MG SL tablet Place 0.4 mg under the tongue every 5 (five) minutes as needed.        . polyethylene glycol powder (GLYCOLAX/MIRALAX) powder TAKE 17 G BY MOUTH AS DIRECTED.  527 g  3   No facility-administered encounter medications on file as of 05/09/2012.    Review of Systems  Constitutional: Negative.   HENT: Negative.   Eyes: Negative.   Respiratory: Negative.   Cardiovascular: Negative.   Gastrointestinal: Negative.   Musculoskeletal: Negative.   Skin: Negative.   Neurological: Negative.   Psychiatric/Behavioral: Negative.   All other systems reviewed and are negative.   BP 114/72  Pulse 62  Ht 6\' 1"  (1.854 m)  Wt 168 lb 12 oz (76.544 kg)  BMI 22.27 kg/m2  Physical Exam  Nursing note and vitals reviewed. Constitutional: He is oriented to person, place, and time. He appears well-developed and well-nourished.  HENT:  Head: Normocephalic.  Nose: Nose normal.  Mouth/Throat: Oropharynx is clear and moist.  Eyes: Conjunctivae are normal. Pupils are equal, round, and reactive to light.  Neck: Normal range of motion. Neck supple. No JVD present.  Cardiovascular: Normal rate, regular rhythm, S1 normal, S2  normal, normal heart sounds and intact distal pulses.  Exam reveals no gallop and no friction rub.   No murmur heard. Pulmonary/Chest: Effort normal and breath sounds normal. No respiratory distress. He has no wheezes. He has no rales. He exhibits no tenderness.  Abdominal: Soft. Bowel sounds are normal. He exhibits no distension. There is no tenderness.  Musculoskeletal: Normal range of motion. He exhibits no edema and no tenderness.  Lymphadenopathy:    He has no cervical adenopathy.  Neurological: He is alert and oriented to person, place, and time.  Coordination normal.  Skin: Skin is warm and dry. No rash noted. No erythema.  Psychiatric: He has a normal mood and affect. His behavior is normal. Judgment and thought content normal.      Assessment and Plan

## 2012-05-09 NOTE — Assessment & Plan Note (Signed)
We'll continue current medications. He is interested in decreasing amiodarone dose down to 100 mg daily. We have suggested he could try this. If he has recurrent atrial fibrillation, he could take extra amiodarone and metoprolol to convert himself back to normal sinus rhythm.

## 2012-05-14 LAB — CBC
HCT: 39.2 % — ABNORMAL LOW (ref 40.0–52.0)
HGB: 13.2 g/dL (ref 13.0–18.0)
MCH: 28.3 pg (ref 26.0–34.0)
MCHC: 33.6 g/dL (ref 32.0–36.0)
MCV: 84 fL (ref 80–100)
RBC: 4.65 10*6/uL (ref 4.40–5.90)
RDW: 13.4 % (ref 11.5–14.5)
WBC: 6.9 10*3/uL (ref 3.8–10.6)

## 2012-05-14 LAB — BASIC METABOLIC PANEL
Anion Gap: 6 — ABNORMAL LOW (ref 7–16)
Chloride: 108 mmol/L — ABNORMAL HIGH (ref 98–107)
Co2: 26 mmol/L (ref 21–32)
Creatinine: 1.37 mg/dL — ABNORMAL HIGH (ref 0.60–1.30)
EGFR (African American): 57 — ABNORMAL LOW
Glucose: 124 mg/dL — ABNORMAL HIGH (ref 65–99)
Sodium: 140 mmol/L (ref 136–145)

## 2012-05-14 LAB — PROTIME-INR: INR: 1

## 2012-05-14 LAB — CK TOTAL AND CKMB (NOT AT ARMC): CK, Total: 74 U/L (ref 35–232)

## 2012-05-15 ENCOUNTER — Telehealth: Payer: Self-pay

## 2012-05-15 ENCOUNTER — Telehealth: Payer: Self-pay | Admitting: Internal Medicine

## 2012-05-15 ENCOUNTER — Observation Stay: Payer: Self-pay | Admitting: Internal Medicine

## 2012-05-15 DIAGNOSIS — R072 Precordial pain: Secondary | ICD-10-CM

## 2012-05-15 DIAGNOSIS — I4891 Unspecified atrial fibrillation: Secondary | ICD-10-CM

## 2012-05-15 LAB — CBC WITH DIFFERENTIAL/PLATELET
Basophil %: 0.7 %
Eosinophil #: 0.3 10*3/uL (ref 0.0–0.7)
Eosinophil %: 3.7 %
HCT: 40.1 % (ref 40.0–52.0)
HGB: 13.4 g/dL (ref 13.0–18.0)
Lymphocyte %: 25.6 %
MCH: 28.2 pg (ref 26.0–34.0)
MCV: 84 fL (ref 80–100)
Monocyte #: 0.7 x10 3/mm (ref 0.2–1.0)
Monocyte %: 9.6 %
Neutrophil %: 60.4 %
Platelet: 183 10*3/uL (ref 150–440)
RBC: 4.75 10*6/uL (ref 4.40–5.90)
RDW: 13.5 % (ref 11.5–14.5)

## 2012-05-15 LAB — BASIC METABOLIC PANEL
Anion Gap: 4 — ABNORMAL LOW (ref 7–16)
BUN: 20 mg/dL — ABNORMAL HIGH (ref 7–18)
BUN: 18 mg/dL (ref 7–18)
Calcium, Total: 8.9 mg/dL (ref 8.5–10.1)
Chloride: 109 mmol/L — ABNORMAL HIGH (ref 98–107)
Chloride: 108 mmol/L — ABNORMAL HIGH (ref 98–107)
Co2: 28 mmol/L (ref 21–32)
Co2: 29 mmol/L (ref 21–32)
Creatinine: 1.31 mg/dL — ABNORMAL HIGH (ref 0.60–1.30)
EGFR (African American): >60
EGFR (Non-African Amer.): 52 — ABNORMAL LOW
EGFR (Non-African Amer.): >60
Glucose: 101 mg/dL — ABNORMAL HIGH (ref 65–99)
Glucose: 158 mg/dL — ABNORMAL HIGH (ref 65–99)
Osmolality: 284 (ref 275–301)
Potassium: 4.2 mmol/L (ref 3.5–5.1)
Sodium: 141 mmol/L (ref 136–145)
Sodium: 139 mmol/L (ref 136–145)

## 2012-05-15 LAB — LIPID PANEL
Cholesterol: 123 mg/dL (ref 0–200)
Ldl Cholesterol, Calc: 68 mg/dL (ref 0–100)
Triglycerides: 53 mg/dL (ref 0–200)
VLDL Cholesterol, Calc: 11 mg/dL (ref 5–40)

## 2012-05-15 LAB — CK TOTAL AND CKMB (NOT AT ARMC)
CK, Total: 71 U/L (ref 35–232)
CK, Total: 62 U/L (ref 35–232)
CK-MB: 0.9 ng/mL (ref 0.5–3.6)

## 2012-05-15 NOTE — Telephone Encounter (Signed)
D/c today 5/6 Will attempt TCM #1 5/7

## 2012-05-15 NOTE — Telephone Encounter (Signed)
Message copied by Marcelle Overlie on Tue May 15, 2012 11:25 AM ------      Message from: Coralee Rud      Created: Tue May 15, 2012 10:31 AM      Regarding: tcm/ph       Tcm/ph 05/29/12 with Alinda Money ------

## 2012-05-15 NOTE — Telephone Encounter (Signed)
TCM  

## 2012-05-15 NOTE — Telephone Encounter (Signed)
[facility] follow up dicharged 5/6 [facility] will fax paperwkr Appointment 5/9 with raquel

## 2012-05-15 NOTE — Telephone Encounter (Signed)
Okey Regal - Can you please request DC summary from [facility] and touch base with pt about how he is doing after admission.

## 2012-05-16 ENCOUNTER — Encounter (HOSPITAL_COMMUNITY): Payer: Self-pay | Admitting: *Deleted

## 2012-05-16 ENCOUNTER — Other Ambulatory Visit: Payer: Self-pay | Admitting: *Deleted

## 2012-05-16 ENCOUNTER — Ambulatory Visit (HOSPITAL_COMMUNITY): Admit: 2012-05-16 | Payer: Self-pay | Admitting: Cardiovascular Disease

## 2012-05-16 ENCOUNTER — Emergency Department (HOSPITAL_COMMUNITY): Payer: Medicare Other

## 2012-05-16 ENCOUNTER — Inpatient Hospital Stay (HOSPITAL_COMMUNITY)
Admission: EM | Admit: 2012-05-16 | Discharge: 2012-05-23 | DRG: 234 | Disposition: A | Discharge status: Home Health | Payer: Medicare Other | Attending: Cardiothoracic Surgery | Admitting: Cardiothoracic Surgery

## 2012-05-16 ENCOUNTER — Encounter (HOSPITAL_COMMUNITY)
Admission: EM | Disposition: A | Discharge status: Home Health | Payer: Self-pay | Source: Home / Self Care | Attending: Cardiothoracic Surgery

## 2012-05-16 DIAGNOSIS — R079 Chest pain, unspecified: Secondary | ICD-10-CM

## 2012-05-16 DIAGNOSIS — Z833 Family history of diabetes mellitus: Secondary | ICD-10-CM

## 2012-05-16 DIAGNOSIS — I2510 Atherosclerotic heart disease of native coronary artery without angina pectoris: Secondary | ICD-10-CM

## 2012-05-16 DIAGNOSIS — Z7982 Long term (current) use of aspirin: Secondary | ICD-10-CM

## 2012-05-16 DIAGNOSIS — Z8679 Personal history of other diseases of the circulatory system: Secondary | ICD-10-CM

## 2012-05-16 DIAGNOSIS — Z888 Allergy status to other drugs, medicaments and biological substances status: Secondary | ICD-10-CM

## 2012-05-16 DIAGNOSIS — F29 Unspecified psychosis not due to a substance or known physiological condition: Secondary | ICD-10-CM | POA: Diagnosis not present

## 2012-05-16 DIAGNOSIS — I498 Other specified cardiac arrhythmias: Secondary | ICD-10-CM | POA: Diagnosis present

## 2012-05-16 DIAGNOSIS — G4700 Insomnia, unspecified: Secondary | ICD-10-CM | POA: Diagnosis present

## 2012-05-16 DIAGNOSIS — E782 Mixed hyperlipidemia: Secondary | ICD-10-CM | POA: Diagnosis present

## 2012-05-16 DIAGNOSIS — Y840 Cardiac catheterization as the cause of abnormal reaction of the patient, or of later complication, without mention of misadventure at the time of the procedure: Secondary | ICD-10-CM | POA: Diagnosis present

## 2012-05-16 DIAGNOSIS — E291 Testicular hypofunction: Secondary | ICD-10-CM | POA: Diagnosis present

## 2012-05-16 DIAGNOSIS — Z8546 Personal history of malignant neoplasm of prostate: Secondary | ICD-10-CM

## 2012-05-16 DIAGNOSIS — I4891 Unspecified atrial fibrillation: Secondary | ICD-10-CM | POA: Diagnosis present

## 2012-05-16 DIAGNOSIS — Z951 Presence of aortocoronary bypass graft: Secondary | ICD-10-CM

## 2012-05-16 DIAGNOSIS — D62 Acute posthemorrhagic anemia: Secondary | ICD-10-CM | POA: Diagnosis not present

## 2012-05-16 DIAGNOSIS — Z79899 Other long term (current) drug therapy: Secondary | ICD-10-CM

## 2012-05-16 DIAGNOSIS — T82897A Other specified complication of cardiac prosthetic devices, implants and grafts, initial encounter: Secondary | ICD-10-CM | POA: Diagnosis present

## 2012-05-16 DIAGNOSIS — Z8249 Family history of ischemic heart disease and other diseases of the circulatory system: Secondary | ICD-10-CM

## 2012-05-16 DIAGNOSIS — I200 Unstable angina: Secondary | ICD-10-CM | POA: Diagnosis present

## 2012-05-16 DIAGNOSIS — E0590 Thyrotoxicosis, unspecified without thyrotoxic crisis or storm: Secondary | ICD-10-CM | POA: Diagnosis present

## 2012-05-16 DIAGNOSIS — Z87891 Personal history of nicotine dependence: Secondary | ICD-10-CM

## 2012-05-16 DIAGNOSIS — Z7901 Long term (current) use of anticoagulants: Secondary | ICD-10-CM

## 2012-05-16 DIAGNOSIS — I10 Essential (primary) hypertension: Secondary | ICD-10-CM | POA: Diagnosis present

## 2012-05-16 DIAGNOSIS — Z9861 Coronary angioplasty status: Secondary | ICD-10-CM

## 2012-05-16 HISTORY — PX: LEFT HEART CATHETERIZATION WITH CORONARY ANGIOGRAM: SHX5451

## 2012-05-16 LAB — CREATININE, SERUM
Creatinine, Ser: 1.18 mg/dL (ref 0.50–1.35)
GFR calc Af Amer: 67 mL/min — ABNORMAL LOW (ref >90)
GFR calc non Af Amer: 58 mL/min — ABNORMAL LOW (ref >90)

## 2012-05-16 LAB — SURGICAL PCR SCREEN
MRSA, PCR: NEGATIVE
Staphylococcus aureus: NEGATIVE

## 2012-05-16 LAB — CBC
HCT: 36.8 % — ABNORMAL LOW (ref 39.0–52.0)
HCT: 36.5 % — ABNORMAL LOW (ref 39.0–52.0)
Hemoglobin: 13.3 g/dL (ref 13.0–17.0)
Hemoglobin: 12.6 g/dL — ABNORMAL LOW (ref 13.0–17.0)
MCH: 29.1 pg (ref 26.0–34.0)
MCHC: 36.1 g/dL — ABNORMAL HIGH (ref 30.0–36.0)
MCV: 80.5 fL (ref 78.0–100.0)
RBC: 4.52 MIL/uL (ref 4.22–5.81)
RDW: 13.4 % (ref 11.5–15.5)

## 2012-05-16 LAB — BASIC METABOLIC PANEL
BUN: 22 mg/dL (ref 6–23)
Creatinine, Ser: 1.21 mg/dL (ref 0.50–1.35)
GFR calc Af Amer: 65 mL/min — ABNORMAL LOW (ref >90)
GFR calc non Af Amer: 56 mL/min — ABNORMAL LOW (ref >90)
Glucose, Bld: 160 mg/dL — ABNORMAL HIGH (ref 70–99)

## 2012-05-16 LAB — PROTIME-INR
INR: 1.13 (ref 0.00–1.49)
Prothrombin Time: 14.3 seconds (ref 11.6–15.2)

## 2012-05-16 SURGERY — LEFT HEART CATHETERIZATION WITH CORONARY ANGIOGRAM
Anesthesia: Moderate Sedation | Laterality: Bilateral

## 2012-05-16 MED ORDER — NITROGLYCERIN IN D5W 200-5 MCG/ML-% IV SOLN
2.0000 ug/min | INTRAVENOUS | Status: DC
  Administered 2012-05-17: 40 ug/min via INTRAVENOUS
  Filled 2012-05-16: qty 250

## 2012-05-16 MED ORDER — AMIODARONE HCL 200 MG PO TABS
200.0000 mg | ORAL_TABLET | Freq: Every day | ORAL | Status: DC
  Administered 2012-05-17: 200 mg via ORAL
  Filled 2012-05-16 (×2): qty 1

## 2012-05-16 MED ORDER — SODIUM CHLORIDE 0.9 % IV SOLN: INTRAVENOUS | Status: DC

## 2012-05-16 MED ORDER — TESTOSTERONE 20.25 MG/ACT (1.62%) TD GEL
2.0000 "application " | Freq: Every day | TRANSDERMAL | Status: DC
  Administered 2012-05-17 – 2012-05-23 (×5): 2 via TRANSDERMAL

## 2012-05-16 MED ORDER — MIDAZOLAM HCL 2 MG/2ML IJ SOLN
INTRAMUSCULAR | Status: AC
  Filled 2012-05-16: qty 2

## 2012-05-16 MED ORDER — HEPARIN (PORCINE) IN NACL 2-0.9 UNIT/ML-% IJ SOLN
INTRAMUSCULAR | Status: AC
  Filled 2012-05-16: qty 1000

## 2012-05-16 MED ORDER — ACETAMINOPHEN 325 MG PO TABS
650.0000 mg | ORAL_TABLET | ORAL | Status: DC | PRN
  Administered 2012-05-17: 650 mg via ORAL
  Filled 2012-05-16: qty 2

## 2012-05-16 MED ORDER — METOPROLOL TARTRATE 25 MG PO TABS
25.0000 mg | ORAL_TABLET | Freq: Two times a day (BID) | ORAL | Status: DC
  Administered 2012-05-17 (×2): 25 mg via ORAL
  Filled 2012-05-16 (×6): qty 1

## 2012-05-16 MED ORDER — SODIUM CHLORIDE 0.9 % IJ SOLN: 3.0000 mL | INTRAMUSCULAR | Status: DC | PRN

## 2012-05-16 MED ORDER — PANTOPRAZOLE SODIUM 40 MG PO TBEC
40.0000 mg | DELAYED_RELEASE_TABLET | Freq: Every day | ORAL | Status: DC
  Administered 2012-05-17: 40 mg via ORAL
  Filled 2012-05-16: qty 1

## 2012-05-16 MED ORDER — NITROGLYCERIN IN D5W 200-5 MCG/ML-% IV SOLN
INTRAVENOUS | Status: AC
  Filled 2012-05-16: qty 250

## 2012-05-16 MED ORDER — ASPIRIN EC 81 MG PO TBEC
81.0000 mg | DELAYED_RELEASE_TABLET | Freq: Every day | ORAL | Status: DC
  Administered 2012-05-16 – 2012-05-17 (×2): 81 mg via ORAL
  Filled 2012-05-16 (×3): qty 1

## 2012-05-16 MED ORDER — DEXTROSE 10 % IV SOLN: INTRAVENOUS | Status: DC

## 2012-05-16 MED ORDER — ONDANSETRON HCL 4 MG/2ML IJ SOLN: 4.0000 mg | Freq: Four times a day (QID) | INTRAMUSCULAR | Status: DC | PRN

## 2012-05-16 MED ORDER — MORPHINE SULFATE 2 MG/ML IJ SOLN
2.0000 mg | INTRAMUSCULAR | Status: DC | PRN
  Administered 2012-05-17: 2 mg via INTRAVENOUS
  Filled 2012-05-16: qty 1

## 2012-05-16 MED ORDER — SODIUM CHLORIDE 0.9 % IV SOLN: INTRAVENOUS | Status: AC

## 2012-05-16 MED ORDER — NITROGLYCERIN 0.4 MG SL SUBL: 0.4000 mg | SUBLINGUAL_TABLET | SUBLINGUAL | Status: DC | PRN

## 2012-05-16 MED ORDER — SODIUM CHLORIDE 0.9 % IV SOLN: 250.0000 mL | INTRAVENOUS | Status: DC | PRN

## 2012-05-16 MED ORDER — POLYETHYLENE GLYCOL 3350 17 G PO PACK
17.0000 g | PACK | Freq: Every day | ORAL | Status: DC | PRN
  Filled 2012-05-16: qty 1

## 2012-05-16 MED ORDER — OXYCODONE-ACETAMINOPHEN 5-325 MG PO TABS
ORAL_TABLET | ORAL | Status: AC
  Filled 2012-05-16: qty 1

## 2012-05-16 MED ORDER — FENTANYL CITRATE 0.05 MG/ML IJ SOLN
INTRAMUSCULAR | Status: AC
  Filled 2012-05-16: qty 2

## 2012-05-16 MED ORDER — SODIUM CHLORIDE 0.9 % IJ SOLN: 3.0000 mL | Freq: Two times a day (BID) | INTRAMUSCULAR | Status: DC

## 2012-05-16 MED ORDER — OXYCODONE-ACETAMINOPHEN 5-325 MG PO TABS
1.0000 | ORAL_TABLET | ORAL | Status: DC | PRN
  Administered 2012-05-16: 1 via ORAL

## 2012-05-16 MED ORDER — HEPARIN (PORCINE) IN NACL 100-0.45 UNIT/ML-% IJ SOLN
1050.0000 [IU]/h | INTRAMUSCULAR | Status: DC
  Administered 2012-05-16 – 2012-05-17 (×2): 1050 [IU]/h via INTRAVENOUS
  Filled 2012-05-16 (×3): qty 250

## 2012-05-16 MED ORDER — LIDOCAINE HCL (PF) 1 % IJ SOLN
INTRAMUSCULAR | Status: AC
  Filled 2012-05-16: qty 30

## 2012-05-16 MED ORDER — EZETIMIBE-SIMVASTATIN 10-20 MG PO TABS
1.0000 | ORAL_TABLET | Freq: Every day | ORAL | Status: DC
  Administered 2012-05-16 – 2012-05-22 (×6): 1 via ORAL
  Filled 2012-05-16 (×8): qty 1

## 2012-05-16 MED ORDER — ACETAMINOPHEN 325 MG PO TABS: 650.0000 mg | ORAL_TABLET | ORAL | Status: DC | PRN

## 2012-05-16 NOTE — H&P (Signed)
Patient ID: [name] MRN: [id number], DOB/AGE: Dec 23, 1932   Admit date: 05/16/2012  Primary Physician: Wynona Dove, MD Primary Cardiologist: Concha Se, MD  Pt. Profile:  79 y/o male with h/o CAD and PAF who presents to the ED today with recurrent chest pain.  Problem List  Past Medical History  Diagnosis Date  . Coronary artery disease     a. 1993 s/p PCI/BMS LAD;  b. 2003 PCI RCA;  c. 09/2006 ISR LAD->PCI, PCI RCA;  d. 02/2007 MV: no ischemia;  d. 01/2012 MV: EF 74%, no ischemia.  . Hyperlipidemia   . Hypertension   . Hyperthyroidism   . PAF (paroxysmal atrial fibrillation)     a. previously on amio ->stopped in 2013;  b. recurrent PAF 01/2012->Pradaxa and amio initiated, subsequently came off of pradaxa 2/2 cost;  c. 02/2012 Echo: EF 55-60%, Gr 1 DD.  [name] Low testosterone   . History of prostate cancer     Past Surgical History  Procedure Laterality Date  . Cardiac catheterization  2008  . Prostatectomy    . Hernia repair      s/p mesh bilaterally, Dr. Lemar Livings  . Appendectomy      Dr. Lemar Livings  . Colonoscopy  2013     Allergies  Allergies  Allergen Reactions  . Ambien (Zolpidem Tartrate) Other (See Comments)    Super sensitive.   . Statins     REACTION: muscle aches...crestor  . Zolpidem Tartrate     HPI  79 y/o male with the above problem list.  He has a h/o CAD dating back to the 90's @ which time he underwent PCI of the LAD with subsequent stenting of the RCA in 2003 and repeat PCI of both the LAD and RCA in 2008.  Earlier this year, he was admitted to [facility] with PAF and chest pain.  He converted in the hospital.  I saw him in clinic afterwards and he subsquently underwent 2D echo (nl LV) and Lexi MV (no ischemia).  At the time, he was on Pradaxa (CHA2DS2VASc = 4) however he has since come off 2/2 his wishes to avoid longterm anticoagulation and cost.  He did have recurrent PAF and was subsequently placed on Amio.  He has since cut his amio back to  100mg  daily.  He has not had any recurrence of afib that he is aware of.  He was admitted to [facility] on Monday night 2/2 episodic, nitrate-responsive chest pain.  He r/o for MI and he was placed on protonix 40 and imdur 30.  He was seen by our team @ [facility] and discharged yesterday.  Unfortunately, he had recurrent chest discomfort w/o associated Ss, while feeding some cats in his garage.  He went back into his house, took his imdur and protonix (he did not take any short-acting nitrates) and then went back to bed.  He says that the discomfort persisted for about 6 hrs prior to alerting his wife at which point she drove him into the ED.  Pain eased off on the drive to the ER and he is currently pain free.  His initial troponin, drawn 9.5 hrs after onset of Ss, is 0.00.  We've been asked to eval.  His only complaint @ this time is of headache, which he thinks is r/t imdur.  Home Medications  Prior to Admission medications   Medication Sig Start Date End Date Taking? Authorizing Provider  amiodarone (PACERONE) 200 MG tablet Take 200 mg by mouth daily.  02/08/12  Yes Wynona Dove, MD  ANDROGEL PUMP 20.25 MG/ACT (1.62%) GEL Place 2 application onto the skin every morning.  10/07/11  Yes Historical Provider, MD  aspirin EC 81 MG tablet Take 81 mg by mouth every evening.   Yes Historical Provider, MD  clopidogrel (PLAVIX) 75 MG tablet Take 75 mg by mouth every evening.   Yes Historical Provider, MD  ezetimibe-simvastatin (VYTORIN) 10-20 MG per tablet Take 1 tablet by mouth at bedtime. 05/09/12  Yes Antonieta Iba, MD  isosorbide mononitrate (IMDUR) 30 MG 24 hr tablet Take 30 mg by mouth daily.   Yes Historical Provider, MD  nitroGLYCERIN (NITROSTAT) 0.4 MG SL tablet Place 0.4 mg under the tongue every 5 (five) minutes as needed for chest pain.    Yes Historical Provider, MD  pantoprazole (PROTONIX) 40 MG tablet Take 40 mg by mouth daily.   Yes Historical Provider, MD  polyethylene glycol (MIRALAX /  GLYCOLAX) packet Take 17 g by mouth daily as needed (for constipation).   Yes Historical Provider, MD    Family History  Family History  Problem Relation Age of Onset  . Coronary artery disease Mother   . Heart disease Mother   . Coronary artery disease Sister   . Diabetes Sister   . Heart disease Sister 4    deceased  . Diabetes Father   . Heart disease Brother     Social History  History   Social History  . Marital Status: Married    Spouse Name: N/A    Number of Children: N/A  . Years of Education: N/A   Occupational History  . Not on file.   Social History Main Topics  . Smoking status: Former Smoker    Types: Cigars    Quit date: 10/04/1988  . Smokeless tobacco: Former Neurosurgeon    Types: Chew    Quit date: 09/04/1991  . Alcohol Use: 0.5 oz/week    1 drink(s) per week     Comment: occassionaly  . Drug Use: No  . Sexually Active: Not Currently   Other Topics Concern  . Not on file   Social History Narrative   Lives in [place] with wife. Has 2 cats, dog died. Daughter in [place], 2 step-daughters. Retired from [employer], worked in Arts administrator. Grew up in [place].     Review of Systems General:  No chills, fever, night sweats or weight changes.  Cardiovascular:  +++ chest pain, no dyspnea on exertion, edema, orthopnea, palpitations, paroxysmal nocturnal dyspnea. Dermatological: No rash, lesions/masses Respiratory: No cough, dyspnea Urologic: No hematuria, dysuria Abdominal:   No nausea, vomiting, diarrhea, bright red blood per rectum, melena, or hematemesis Neurologic:  No visual changes, wkns, changes in mental status. All other systems reviewed and are otherwise negative except as noted above.  Physical Exam  Blood pressure 132/60, pulse 62, temperature 98.4 F (36.9 C), temperature source Oral, resp. rate 16, SpO2 100.00%.  General: Pleasant, NAD Psych: Normal affect. Neuro: Alert and oriented X 3. Moves all extremities spontaneously. HEENT:  Normal  Neck: Supple without bruits or JVD. Lungs:  Resp regular and unlabored, CTA. Heart: RRR no s3, s4, or murmurs. Abdomen: Soft, non-tender, non-distended, BS + x 4.  Extremities: No clubbing, cyanosis or edema. DP/PT/Radials 2+ and equal bilaterally.  Labs  Lab Results  Component Value Date   WBC 10.2 05/16/2012   HGB 13.3 05/16/2012   HCT 36.8* 05/16/2012   MCV 80.5 05/16/2012   PLT 193 05/16/2012     Recent Labs Lab  05/16/12 1111  NA 135  K 4.3  CL 104  CO2 23  BUN 22  CREATININE 1.21  CALCIUM 9.3  GLUCOSE 160*   Lab Results  Component Value Date   CHOL 139 04/03/2012   HDL 44.00 04/03/2012   LDLCALC 77 04/03/2012   TRIG 89.0 04/03/2012   Radiology/Studies  Dg Chest 2 View  05/16/2012  *RADIOLOGY REPORT*  Clinical Data: Chest pain, headache  CHEST - 2 VIEW  Comparison: 02/18/2005; 04/09/2002  Findings: Grossly unchanged cardiac silhouette and mediastinal contours.  The lungs appear hyperexpanded with flattening of the bilateral hemidiaphragms mild diffuse thickening of the pulmonary interstitium.  No focal airspace opacity.  No pleural effusion or pneumothorax.  Unchanged bones.  IMPRESSION: Mild lung hyperexpansion of bronchitic change without acute cardiopulmonary disease.   Original Report Authenticated By: Tacey Ruiz, MD    ECG  Rsr, 74, no acute st/t changes.  ASSESSMENT AND PLAN  1.  USA/CAD:  Pt presents with recurrent chest pain.  He was admitted @ [facility] within the past 48 hrs and r/o.  He is currently pain free and despite 6 hrs of discomfort, his initial troponin is nl.  Given his h/o CAD and nl non-invasive testing in the past (Neg MV 01/2012), but now with recurrent Ss resulting in ER visits, we will plan to observe him today and pursue diagnostic catheterization.  Cont home meds.  Plan d/c if cath nonobstructive.  He has developed a headache on imdur and thus he is unlikely to tolerate this in the long run.  2.  PAF:  In sinus.  On low-dose amio.  He took  himself off of pradaxa 2/2 wish to avoid long term anticoagulation and cost.  Cont asa/plavix.  CHA2DS2VASc=4.  3.  HTN:  Stable.  4.  HL:  Cont vytorin.  Signed, Nicolasa Ducking, NP 05/16/2012, 2:37 PM   Attending Note:   The patient was seen and examined.  Agree with assessment and plan as noted above.  Changes made to the above note as needed.  I saw Mr. Passon yesterday during an admission to [facility].  He had a similar presentation - CP , normal Troponin.  His symptoms were ~ 9 hours prior to drawing his troponin and levels are still normal.    He had a normal myoview in the office in January, 2014.  Exam is unremarkable.  I think our best solution is to do a cath.  Otherwise, I think he will continue to come to the hospital for evaluation.  He has a hx of stenting in the past.   Plan is for cath this afternoon.    Vesta Mixer, Montez Hageman., MD, [facility] 05/16/2012, 3:18 PM

## 2012-05-16 NOTE — Telephone Encounter (Signed)
Pt called back  Says he is having chest pain again and is considering going to [facility] ER  He was just d/c from [facility] yesterday with imdur, pantoprazole and NTG He took imdur, pantoprazole this am Still c/o SS CP that is "sharp", not associated with sob, not radiating He has not tried SL NTG I advised he try this and if he has to take 3 within 15 mons he should proceed to nearest ER He asks, "what do I do if the NTG helps it?".  I advised him to let us know and then I offered him an apt this PM with PA at 2:30. He says he cannot wait that long and is going to ER

## 2012-05-16 NOTE — ED Provider Notes (Signed)
History     CSN: [id number]  Arrival date & time 05/16/12  1052   First MD Initiated Contact with Patient 05/16/12 1114      Chief Complaint  Patient presents with  . Chest Pain    (Consider location/radiation/quality/duration/timing/severity/associated sxs/prior treatment) Patient is a 79 y.o. male presenting with chest pain. The history is provided by the patient.  Chest Pain Associated symptoms: no abdominal pain, no back pain, no cough, no fever, no headache, no palpitations and no shortness of breath   pt with hx cad/stents, c/o intermittent mid chest pain for the past 2 days. Worse w exertion. Better w ntg/rest. Mid chest. Non radiating. Dull. Moderate. Resolved now. Has noticed some jaw tightness. No sob, nv or diaphoresis. Says somewhat similar to prior cardiac cp.  Pain is intermittent, and not pleuritic. No leg pain or swelling. Denies abdominal or back pain. Denies heartburn or gi symptoms. No cough or uri c/o. No fever or chills.     Past Medical History  Diagnosis Date  . Coronary artery disease     a. 1993 s/p PCI/BMS LAD;  b. 2003 PCI RCA;  c. 09/2006 ISR LAD->PCI, PCI RCA;  d. 02/2007 MV: no ischemia  . Hyperlipidemia   . Hypertension   . Hyperthyroidism   . PAF (paroxysmal atrial fibrillation)     a. previously on amio ->stopped in 2013;  b. recurrent PAF 01/2012  . Low testosterone   . History of prostate cancer     Past Surgical History  Procedure Laterality Date  . Cardiac catheterization  2008  . Prostatectomy    . Hernia repair      s/p mesh bilaterally, Dr. Lemar Livings  . Appendectomy      Dr. Lemar Livings  . Colonoscopy  2013    Family History  Problem Relation Age of Onset  . Coronary artery disease Mother   . Heart disease Mother   . Coronary artery disease Sister   . Diabetes Sister   . Heart disease Sister 7    deceased  . Diabetes Father   . Heart disease Brother     History  Substance Use Topics  . Smoking status: Former Smoker    Types:  Cigars    Quit date: 10/04/1988  . Smokeless tobacco: Former Neurosurgeon    Types: Chew    Quit date: 09/04/1991  . Alcohol Use: 0.5 oz/week    1 drink(s) per week     Comment: occassionaly      Review of Systems  Constitutional: Negative for fever.  HENT: Negative for neck pain.   Eyes: Negative for redness.  Respiratory: Negative for cough and shortness of breath.   Cardiovascular: Positive for chest pain. Negative for palpitations and leg swelling.  Gastrointestinal: Negative for abdominal pain.  Genitourinary: Negative for flank pain.  Musculoskeletal: Negative for back pain.  Skin: Negative for rash.  Neurological: Negative for headaches.  Hematological: Does not bruise/bleed easily.  Psychiatric/Behavioral: Negative for confusion.    Allergies  Ambien; Statins; and Zolpidem tartrate  Home Medications   Current Outpatient Rx  Name  Route  Sig  Dispense  Refill  . amiodarone (PACERONE) 200 MG tablet   Oral   Take 200 mg by mouth daily.          . ANDROGEL PUMP 20.25 MG/ACT (1.62%) GEL               . aspirin 81 MG EC tablet   Oral   Take 1 tablet (81  mg total) by mouth daily. Swallow whole.   30 tablet   12   . clopidogrel (PLAVIX) 75 MG tablet   Oral   Take 1 tablet (75 mg total) by mouth daily.   30 tablet   11   . ezetimibe-simvastatin (VYTORIN) 10-20 MG per tablet   Oral   Take 1 tablet by mouth at bedtime.   90 tablet   3   . nitroGLYCERIN (NITROSTAT) 0.4 MG SL tablet   Sublingual   Place 0.4 mg under the tongue every 5 (five) minutes as needed.           . polyethylene glycol powder (GLYCOLAX/MIRALAX) powder      TAKE 17 G BY MOUTH AS DIRECTED.   527 g   3     No refills available     BP 116/59  Pulse 62  Temp(Src) 98.4 F (36.9 C) (Oral)  Resp 16  SpO2 100%  Physical Exam  Nursing note and vitals reviewed. Constitutional: He is oriented to person, place, and time. He appears well-developed and well-nourished. No distress.   HENT:  Head: Atraumatic.  Mouth/Throat: Oropharynx is clear and moist.  Eyes: Conjunctivae are normal.  Neck: Neck supple. No tracheal deviation present.  Cardiovascular: Normal rate, regular rhythm, normal heart sounds and intact distal pulses.  Exam reveals no gallop and no friction rub.   No murmur heard. Pulmonary/Chest: Effort normal and breath sounds normal. No accessory muscle usage. No respiratory distress. He exhibits no tenderness.  Abdominal: Soft. Bowel sounds are normal. He exhibits no distension. There is no tenderness.  Musculoskeletal: Normal range of motion. He exhibits no edema and no tenderness.  Neurological: He is alert and oriented to person, place, and time.  Skin: Skin is warm and dry.  Psychiatric: He has a normal mood and affect.    ED Course  Procedures (including critical care time)  Results for orders placed during the hospital encounter of 05/16/12  CBC      Result Value Range   WBC 10.2  4.0 - 10.5 K/uL   RBC 4.57  4.22 - 5.81 MIL/uL   Hemoglobin 13.3  13.0 - 17.0 g/dL   HCT 16.1 (*) 09.6 - 04.5 %   MCV 80.5  78.0 - 100.0 fL   MCH 29.1  26.0 - 34.0 pg   MCHC 36.1 (*) 30.0 - 36.0 g/dL   RDW 40.9  81.1 - 91.4 %   Platelets 193  150 - 400 K/uL  BASIC METABOLIC PANEL      Result Value Range   Sodium 135  135 - 145 mEq/L   Potassium 4.3  3.5 - 5.1 mEq/L   Chloride 104  96 - 112 mEq/L   CO2 23  19 - 32 mEq/L   Glucose, Bld 160 (*) 70 - 99 mg/dL   BUN 22  6 - 23 mg/dL   Creatinine, Ser 7.82  0.50 - 1.35 mg/dL   Calcium 9.3  8.4 - 95.6 mg/dL   GFR calc non Af Amer 56 (*) >90 mL/min   GFR calc Af Amer 65 (*) >90 mL/min   Dg Chest 2 View  05/16/2012  *RADIOLOGY REPORT*  Clinical Data: Chest pain, headache  CHEST - 2 VIEW  Comparison: 02/18/2005; 04/09/2002  Findings: Grossly unchanged cardiac silhouette and mediastinal contours.  The lungs appear hyperexpanded with flattening of the bilateral hemidiaphragms mild diffuse thickening of the pulmonary  interstitium.  No focal airspace opacity.  No pleural effusion or pneumothorax.  Unchanged  bones.  IMPRESSION: Mild lung hyperexpansion of bronchitic change without acute cardiopulmonary disease.   Original Report Authenticated By: Tacey Ruiz, MD        MDM  Iv ns. Labs. Cxr.  Reviewed nursing notes and prior charts for additional history.   Pt w hx multiple prior caths, stents.  Pt states cardiologist is [facility] card - will page cardmaster and have them see pt in ed.  Recheck not cp.   Pt has had asa/plavix today.    Date: 05/16/2012  Rate: 74  Rhythm: normal sinus rhythm  QRS Axis: normal  Intervals: PR prolonged  ST/T Wave abnormalities: normal  Conduction Disutrbances:first-degree A-V block   Narrative Interpretation:   Old EKG Reviewed: unchanged   leb card called- will see in ed.  Recheck pt no cp.     Suzi Roots, MD 05/16/12 1331

## 2012-05-16 NOTE — Telephone Encounter (Signed)
tcm attempt #1 No answer

## 2012-05-16 NOTE — Telephone Encounter (Signed)
Request faxed

## 2012-05-16 NOTE — Progress Notes (Signed)
ANTICOAGULATION CONSULT NOTE - Initial Consult  Pharmacy Consult for heparin Indication: chest pain/ACS  Allergies  Allergen Reactions  . Ambien (Zolpidem Tartrate) Other (See Comments)    Super sensitive.   . Statins     REACTION: muscle aches...crestor  . Zolpidem Tartrate     Patient Measurements:   Heparin Dosing Weight: 77  Vital Signs: Temp: 98.4 F (36.9 C) (05/07 1123) Temp src: Oral (05/07 1123) BP: 130/66 mmHg (05/07 1500) Pulse Rate: 50 (05/07 1500)  Labs:  Recent Labs  05/16/12 1111  HGB 13.3  HCT 36.8*  PLT 193  CREATININE 1.21    The CrCl is unknown because both a height and weight (above a minimum accepted value) are required for this calculation.   Medical History: Past Medical History  Diagnosis Date  . Coronary artery disease     a. 1993 s/p PCI/BMS LAD;  b. 2003 PCI RCA;  c. 09/2006 ISR LAD->PCI, PCI RCA;  d. 02/2007 MV: no ischemia;  d. 01/2012 MV: EF 74%, no ischemia.  . Hyperlipidemia   . Hypertension   . Hyperthyroidism   . PAF (paroxysmal atrial fibrillation)     a. previously on amio ->stopped in 2013;  b. recurrent PAF 01/2012->Pradaxa and amio initiated, subsequently came off of pradaxa 2/2 cost;  c. 02/2012 Echo: EF 55-60%, Gr 1 DD.  [name] Low testosterone   . History of prostate cancer     Medications:  Prescriptions prior to admission  Medication Sig Dispense Refill  . amiodarone (PACERONE) 200 MG tablet Take 200 mg by mouth daily.       . ANDROGEL PUMP 20.25 MG/ACT (1.62%) GEL Place 2 application onto the skin every morning.       [name] aspirin EC 81 MG tablet Take 81 mg by mouth every evening.      . clopidogrel (PLAVIX) 75 MG tablet Take 75 mg by mouth every evening.      . ezetimibe-simvastatin (VYTORIN) 10-20 MG per tablet Take 1 tablet by mouth at bedtime.  90 tablet  3  . isosorbide mononitrate (IMDUR) 30 MG 24 hr tablet Take 30 mg by mouth daily.      . nitroGLYCERIN (NITROSTAT) 0.4 MG SL tablet Place 0.4 mg under the tongue every  5 (five) minutes as needed for chest pain.       . pantoprazole (PROTONIX) 40 MG tablet Take 40 mg by mouth daily.      . polyethylene glycol (MIRALAX / GLYCOLAX) packet Take 17 g by mouth daily as needed (for constipation).       Scheduled:  . [COMPLETED] fentaNYL      . [COMPLETED] heparin      . [COMPLETED] lidocaine (PF)      . [COMPLETED] midazolam        Assessment: 79 yo with hx CAD and PAF who was admitted for CP. He underwent cath and showed severe CAD. Plan on CVTS consult for CABG. IV heparin has been ordered to start 3 hr post cath. Pt was not on coumadin PTA.  Goal of Therapy:  Heparin level 0.3-0.7 units/ml Monitor platelets by anticoagulation protocol: Yes   Plan:   Heparin drip at 1050 units/hr F/u with heparin level   [name] [place] 05/16/2012,5:30 PM

## 2012-05-16 NOTE — ED Notes (Signed)
Pt reports going to [facility] yesterday for cp and dc home. Had onset again this am 0400 of mid chest pains with jaw discomfort. No acute distress noted at triage, ekg done. Airway intact.

## 2012-05-16 NOTE — Interval H&P Note (Signed)
History and Physical Interval Note:  05/16/2012 3:45 PM  [name]  has presented today for cardiac cath with the diagnosis of CAD/CP. The various methods of treatment have been discussed with the patient and family. After consideration of risks, benefits and other options for treatment, the patient has consented to  Procedure(s): LEFT HEART CATHETERIZATION WITH CORONARY ANGIOGRAM (Bilateral) as a surgical intervention .  The patient's history has been reviewed, patient examined, no change in status, stable for surgery.  I have reviewed the patient's chart and labs.  Questions were answered to the patient's satisfaction.     [name],[name]

## 2012-05-16 NOTE — CV Procedure (Signed)
   Cardiac Catheterization Operative Report  ANIELLO CHRISTOPOULOS [id number] 5/7/20144:30 PM Wynona Dove, MD  Procedure Performed:  1. Left Heart Catheterization 2. Selective Coronary Angiography 3. Left ventricular angiogram  Operator: Verne Carrow, MD  Indication:  79 yo male with history of CAD with multiple prior stenting procedures (LAD and RCA), HTN, HLD, atrial fibrillation admitted with unstable angina.  Cardiac markers negative thus far.                                    Procedure Details: The risks, benefits, complications, treatment options, and expected outcomes were discussed with the patient. The patient and/or family concurred with the proposed plan, giving informed consent. The patient was brought to the cath lab after IV hydration was begun and oral premedication was given. The patient was further sedated with Versed and Fentanyl. Allens test was negative on the right wrist. The right groin was prepped and draped in the usual manner. Using the modified Seldinger access technique, a 5 French sheath was placed in the right femoral artery. Standard diagnostic catheters were used to perform selective coronary angiography. A pigtail catheter was used to perform a left ventricular angiogram.  There were no immediate complications. The patient was taken to the recovery area in stable condition.   Hemodynamic Findings: Central aortic pressure: 154/73 Left ventricular pressure: 157/8/17  Angiographic Findings:  Left main: No obstructive disease  Left Anterior Descending Artery: Large caliber vessel that courses the apex. The proximal vessel has diffuse 50% stenosis. The mid vessel has a stented segment with 50% proximal stenosis followed by a focal 99% stenosis in the mid portion of the stented segment. The first diagonal branch is small in caliber with 50% ostial stenosis. The distal LAD has diffuse plaque.   Circumflex Artery: Large caliber vessel with two large  obtuse marginal branches. The first OM branch has 70% focal stenosis. The second OM branch has a focal 80% stenosis.   Right Coronary Artery: Very large dominant vessel with 80% proximal stenosis. The stented segment of the proximal vessel has mild diffuse restenosis. The mid vessel has diffuse 40% stenosis.   Left Ventricular Angiogram: LVEF 45% with hypokinesis of the anterior Hattery.   Impression: 1. Severe triple vessel CAD with restenosis in mid LAD stent 2. Mild to moderate LV systolic dysfunction 3. Unstable angina  Recommendations: Will admit to stepdown. IV heparin 3 hours post closure device. Will start IV NTG. Will ask CT surgery to evaluate tonight. He has been on chronic Plavix therapy. If we can stabilize, can delay surgery but if he becomes unstable, will need to consider earlier surgery. Will check P2Y12 in am to assess platelet function. Continue ASA, beta blocker. No statin due to intolerance.        Complications:  None. The patient tolerated the procedure well.

## 2012-05-17 ENCOUNTER — Encounter (HOSPITAL_COMMUNITY): Payer: Self-pay | Admitting: Anesthesiology

## 2012-05-17 ENCOUNTER — Other Ambulatory Visit: Payer: Self-pay | Admitting: *Deleted

## 2012-05-17 ENCOUNTER — Inpatient Hospital Stay (HOSPITAL_COMMUNITY): Payer: Medicare Other

## 2012-05-17 DIAGNOSIS — I4891 Unspecified atrial fibrillation: Secondary | ICD-10-CM

## 2012-05-17 DIAGNOSIS — I359 Nonrheumatic aortic valve disorder, unspecified: Secondary | ICD-10-CM

## 2012-05-17 DIAGNOSIS — Z01810 Encounter for preprocedural cardiovascular examination: Secondary | ICD-10-CM

## 2012-05-17 DIAGNOSIS — I10 Essential (primary) hypertension: Secondary | ICD-10-CM

## 2012-05-17 DIAGNOSIS — I2510 Atherosclerotic heart disease of native coronary artery without angina pectoris: Secondary | ICD-10-CM

## 2012-05-17 LAB — CBC
Hemoglobin: 12.6 g/dL — ABNORMAL LOW (ref 13.0–17.0)
MCHC: 35.3 g/dL (ref 30.0–36.0)
RDW: 13.1 % (ref 11.5–15.5)

## 2012-05-17 LAB — HEPARIN LEVEL (UNFRACTIONATED): Heparin Unfractionated: 0.41 IU/mL (ref 0.30–0.70)

## 2012-05-17 LAB — BASIC METABOLIC PANEL
BUN: 19 mg/dL (ref 6–23)
CO2: 22 mEq/L (ref 19–32)
Calcium: 9.2 mg/dL (ref 8.4–10.5)
Chloride: 102 mEq/L (ref 96–112)
Creatinine, Ser: 1.1 mg/dL (ref 0.50–1.35)
GFR calc Af Amer: 73 mL/min — ABNORMAL LOW (ref >90)
GFR calc non Af Amer: 63 mL/min — ABNORMAL LOW (ref >90)
Glucose, Bld: 114 mg/dL — ABNORMAL HIGH (ref 70–99)
Potassium: 4.1 mEq/L (ref 3.5–5.1)
Sodium: 139 mEq/L (ref 135–145)

## 2012-05-17 LAB — URINALYSIS, ROUTINE W REFLEX MICROSCOPIC
Ketones, ur: 15 mg/dL — AB
Leukocytes, UA: NEGATIVE
Nitrite: NEGATIVE
Protein, ur: NEGATIVE mg/dL
Urobilinogen, UA: 1 mg/dL (ref 0.0–1.0)
pH: 6 (ref 5.0–8.0)

## 2012-05-17 LAB — BLOOD GAS, ARTERIAL
Acid-base deficit: 1.2 mmol/L (ref 0.0–2.0)
Bicarbonate: 22.9 mEq/L (ref 20.0–24.0)
Drawn by: 31101
O2 Saturation: 96.3 %
Patient temperature: 98.6
TCO2: 24.1 mmol/L (ref 0–100)
pCO2 arterial: 37.7 mmHg (ref 35.0–45.0)
pH, Arterial: 7.401 (ref 7.350–7.450)
pO2, Arterial: 81.7 mmHg (ref 80.0–100.0)

## 2012-05-17 LAB — ABO/RH: ABO/RH(D): B POS

## 2012-05-17 LAB — PLATELET INHIBITION P2Y12: Platelet Function  P2Y12: 250 [PRU] (ref 194–418)

## 2012-05-17 LAB — PREPARE RBC (CROSSMATCH)

## 2012-05-17 MED ORDER — CHLORHEXIDINE GLUCONATE 4 % EX LIQD
60.0000 mL | Freq: Once | CUTANEOUS | Status: AC
  Administered 2012-05-18: 4 via TOPICAL
  Filled 2012-05-17: qty 60

## 2012-05-17 MED ORDER — ASPIRIN 81 MG PO CHEW
CHEWABLE_TABLET | ORAL | Status: AC
  Filled 2012-05-17: qty 1

## 2012-05-17 MED ORDER — SODIUM CHLORIDE 0.9 % IV SOLN
INTRAVENOUS | Status: DC
  Filled 2012-05-17: qty 30

## 2012-05-17 MED ORDER — DEXMEDETOMIDINE HCL IN NACL 400 MCG/100ML IV SOLN
0.1000 ug/kg/h | INTRAVENOUS | Status: AC
  Administered 2012-05-18: 0.2 ug/kg/h via INTRAVENOUS
  Filled 2012-05-17: qty 100

## 2012-05-17 MED ORDER — TEMAZEPAM 15 MG PO CAPS: 15.0000 mg | ORAL_CAPSULE | Freq: Once | ORAL | Status: AC | PRN

## 2012-05-17 MED ORDER — DEXTROSE 5 % IV SOLN
750.0000 mg | INTRAVENOUS | Status: DC
  Filled 2012-05-17: qty 750

## 2012-05-17 MED ORDER — DEXTROSE 5 % IV SOLN
30.0000 ug/min | INTRAVENOUS | Status: DC
  Filled 2012-05-17: qty 2

## 2012-05-17 MED ORDER — NITROGLYCERIN IN D5W 200-5 MCG/ML-% IV SOLN
2.0000 ug/min | INTRAVENOUS | Status: AC
  Filled 2012-05-17: qty 250

## 2012-05-17 MED ORDER — BISACODYL 5 MG PO TBEC
5.0000 mg | DELAYED_RELEASE_TABLET | Freq: Once | ORAL | Status: AC
  Administered 2012-05-17: 5 mg via ORAL
  Filled 2012-05-17: qty 1

## 2012-05-17 MED ORDER — CHLORHEXIDINE GLUCONATE 4 % EX LIQD
60.0000 mL | Freq: Once | CUTANEOUS | Status: AC
  Administered 2012-05-17: 4 via TOPICAL
  Filled 2012-05-17: qty 60

## 2012-05-17 MED ORDER — DEXTROSE 5 % IV SOLN
1.5000 g | INTRAVENOUS | Status: AC
  Administered 2012-05-18: 1.5 g via INTRAVENOUS
  Administered 2012-05-18: .75 g via INTRAVENOUS
  Filled 2012-05-17: qty 1.5

## 2012-05-17 MED ORDER — POTASSIUM CHLORIDE 2 MEQ/ML IV SOLN
80.0000 meq | INTRAVENOUS | Status: DC
  Filled 2012-05-17: qty 40

## 2012-05-17 MED ORDER — METOPROLOL TARTRATE 12.5 MG HALF TABLET
12.5000 mg | ORAL_TABLET | Freq: Once | ORAL | Status: DC
  Filled 2012-05-17: qty 1

## 2012-05-17 MED ORDER — PLASMA-LYTE 148 IV SOLN
INTRAVENOUS | Status: AC
  Administered 2012-05-18: 09:00:00
  Filled 2012-05-17: qty 2.5

## 2012-05-17 MED ORDER — ALBUTEROL SULFATE (5 MG/ML) 0.5% IN NEBU
2.5000 mg | INHALATION_SOLUTION | Freq: Once | RESPIRATORY_TRACT | Status: AC
  Administered 2012-05-17: 2.5 mg via RESPIRATORY_TRACT

## 2012-05-17 MED ORDER — SODIUM CHLORIDE 0.9 % IV SOLN
INTRAVENOUS | Status: AC
  Administered 2012-05-18: 70 mL/h via INTRAVENOUS
  Filled 2012-05-17: qty 40

## 2012-05-17 MED ORDER — SODIUM CHLORIDE 0.9 % IV SOLN
INTRAVENOUS | Status: AC
  Administered 2012-05-18: 1.5 [IU]/h via INTRAVENOUS
  Filled 2012-05-17: qty 1

## 2012-05-17 MED ORDER — EPINEPHRINE HCL 1 MG/ML IJ SOLN
0.5000 ug/min | INTRAMUSCULAR | Status: DC
  Filled 2012-05-17: qty 4

## 2012-05-17 MED ORDER — DOPAMINE-DEXTROSE 3.2-5 MG/ML-% IV SOLN
2.0000 ug/kg/min | INTRAVENOUS | Status: AC
  Administered 2012-05-18: 3 ug/kg/min via INTRAVENOUS
  Filled 2012-05-17: qty 250

## 2012-05-17 MED ORDER — DIAZEPAM 5 MG PO TABS
5.0000 mg | ORAL_TABLET | ORAL | Status: DC | PRN
  Administered 2012-05-17: 5 mg via ORAL
  Filled 2012-05-17: qty 1

## 2012-05-17 MED ORDER — MAGNESIUM SULFATE 50 % IJ SOLN
40.0000 meq | INTRAMUSCULAR | Status: DC
  Filled 2012-05-17: qty 10

## 2012-05-17 MED ORDER — VANCOMYCIN HCL 10 G IV SOLR
1250.0000 mg | INTRAVENOUS | Status: AC
  Administered 2012-05-18: 1250 mg via INTRAVENOUS
  Filled 2012-05-17 (×2): qty 1250

## 2012-05-17 NOTE — Progress Notes (Signed)
Echocardiogram 2D Echocardiogram has been performed.  [name] 05/17/2012, 11:52 AM

## 2012-05-17 NOTE — Consult Note (Signed)
[address]          [place] [address]       [phone]       [name] [facility] Medical Record #[id number] Date of Birth: 17-Apr-1932  Referring: No ref. provider found Primary Care: Wynona Dove, MD Unstable angina with three-vessel CAD Patient examined, cardiac cath in 2-D echocardiogram reviewed. Chief Complaint:    Chief Complaint  Patient presents with  . Chest Pain   present illness  79 year old Caucasian male ex-smoker with history of CAD status post prior PCI-stent to LAD and RCA stent with unstable angina, negative cardiac enzymes and history of paroxysmal atrial fibrillation for the past 3-4 years. His pain cells on IV heparin and nitroglycerin. Cardiac catheterization showed 90% stenosis of the LAD stent, proximal 80% stenosis of the RCA prior to a patent stent, and 80% stenosis of the OM1 and OM 2 range the circumflex. EF is 50% with hypokinesia of the anterior Joens. LVEDP is 17 mmHg. Carotid duplex scan shows no significant carotid disease. Chest x-ray shows no active disease.  The patient is been on Plavix for several years. He has symptomatic bruising but no serious bleeding complication. Platelet function assay-P2 Y. 12-shows platelet activity at 250 which indicates adequate platelet function poor heart surgery. Fracture is currently on hold.  Patient had been taking Pradaxa for his atrial fibrillation but stopped several months ago because of medication cost.  Zubrod Score: At the time of surgery this patient's most appropriate activity status/level should be described as: []  Normal activity, no symptoms [x]  Symptoms, fully ambulatory []  Symptoms, in bed less than or equal to 50% of the time []  Symptoms, in bed greater than 50% of the time but less than 100% []  Bedridden []  Moribund  Past Medical History  Diagnosis Date  . Coronary artery disease     a. 1993 s/p PCI/BMS LAD;  b. 2003 PCI RCA;  c. 09/2006 ISR  LAD->PCI, PCI RCA;  d. 02/2007 MV: no ischemia;  d. 01/2012 MV: EF 74%, no ischemia.  . Hyperlipidemia   . Hypertension   . Hyperthyroidism   . PAF (paroxysmal atrial fibrillation)     a. previously on amio ->stopped in 2013;  b. recurrent PAF 01/2012->Pradaxa and amio initiated, subsequently came off of pradaxa 2/2 cost;  c. 02/2012 Echo: EF 55-60%, Gr 1 DD.  [name] Low testosterone   . History of prostate cancer    prostatectomy performed at [facility].   no evidence of regional or metastatic disease  No problems with anesthesia for his prostatectomy no problems with bleeding from his previous operations  Past Surgical History  Procedure Laterality Date  . Cardiac catheterization  2008  . Prostatectomy    . Hernia repair      s/p mesh bilaterally, Dr. Lemar Livings  . Appendectomy      Dr. Lemar Livings  . Colonoscopy  2013    History  Smoking status  . Former Smoker  . Types: Cigars  . Quit date: 10/04/1988  Smokeless tobacco  . Former Neurosurgeon  . Types: Chew  . Quit date: 09/04/1991    History  Alcohol Use  . 0.5 oz/week  . 1 drink(s) per week    Comment: occassionaly    History   Social History  . Marital Status: Married    Spouse Name: N/A    Number of Children: N/A  . Years of  Education: N/A   Occupational History  . Not on file.   Social History Main Topics  . Smoking status: Former Smoker    Types: Cigars    Quit date: 10/04/1988  . Smokeless tobacco: Former Neurosurgeon    Types: Chew    Quit date: 09/04/1991  . Alcohol Use: 0.5 oz/week    1 drink(s) per week     Comment: occassionaly  . Drug Use: No  . Sexually Active: Not Currently   Other Topics Concern  . Not on file   Social History Narrative   Lives in [place] with wife. Has 2 cats, dog died. Daughter in [place], 2 step-daughters. Retired from [employer], worked in Arts administrator. Grew up in [place].    Allergies  Allergen Reactions  . Ambien (Zolpidem Tartrate) Other (See Comments)    Super sensitive.   .  Statins     REACTION: muscle aches...crestor  . Zolpidem Tartrate     Current Facility-Administered Medications  Medication Dose Route Frequency Provider Last Rate Last Dose  . 0.9 %  sodium chloride infusion  250 mL Intravenous PRN Ok Anis, NP 10 mL/hr at 05/17/12 0800 250 mL at 05/17/12 0800  . 0.9 %  sodium chloride infusion   Intravenous Continuous Ok Anis, NP      . acetaminophen (TYLENOL) tablet 650 mg  650 mg Oral Q4H PRN Kathleene Hazel, MD   650 mg at 05/17/12 0518  . [START ON 05/18/2012] aminocaproic acid (AMICAR) 10 g in sodium chloride 0.9 % 100 mL infusion   Intravenous To OR Vesta Mixer, MD      . amiodarone (PACERONE) tablet 200 mg  200 mg Oral Daily Ok Anis, NP   200 mg at 05/17/12 1055  . aspirin EC tablet 81 mg  81 mg Oral QHS Ok Anis, NP   81 mg at 05/16/12 2120  . bisacodyl (DULCOLAX) EC tablet 5 mg  5 mg Oral Once Kerin Perna, MD      . Melene Muller ON 05/18/2012] cefUROXime (ZINACEF) 1.5 g in dextrose 5 % 50 mL IVPB  1.5 g Intravenous To OR Vesta Mixer, MD      . Melene Muller ON 05/18/2012] cefUROXime (ZINACEF) 750 mg in dextrose 5 % 50 mL IVPB  750 mg Intravenous To OR Vesta Mixer, MD      . chlorhexidine (HIBICLENS) 4 % liquid 4 application  60 mL Topical Once Kerin Perna, MD      . Melene Muller ON 05/18/2012] chlorhexidine (HIBICLENS) 4 % liquid 4 application  60 mL Topical Once Kerin Perna, MD      . Melene Muller ON 05/18/2012] dexmedetomidine (PRECEDEX) 400 MCG/100ML infusion  0.1-0.7 mcg/kg/hr Intravenous To OR Vesta Mixer, MD      . diazepam (VALIUM) tablet 5-10 mg  5-10 mg Oral Q4H PRN Kerin Perna, MD      . Melene Muller ON 05/18/2012] DOPamine (INTROPIN) 800 mg in dextrose 5 % 250 mL infusion  2-20 mcg/kg/min Intravenous To OR Vesta Mixer, MD      . Melene Muller ON 05/18/2012] EPINEPHrine (ADRENALIN) 4,000 mcg in dextrose 5 % 250 mL infusion  0.5-20 mcg/min Intravenous To OR Vesta Mixer, MD      . ezetimibe-simvastatin  (VYTORIN) 10-20 MG per tablet 1 tablet  1 tablet Oral QHS Ok Anis, NP   1 tablet at 05/16/12 2120  . [START ON 05/18/2012] heparin 2,500 Units, papaverine 30 mg in electrolyte-148 (PLASMALYTE-148) 500 mL  irrigation   Irrigation To OR Vesta Mixer, MD      . Melene Muller ON 05/18/2012] heparin 30,000 units/NS 1000 mL solution for CELLSAVER   Other To OR Vesta Mixer, MD      . heparin ADULT infusion 100 units/mL (25000 units/250 mL)  1,050 Units/hr Intravenous Continuous Vesta Mixer, MD 10.5 mL/hr at 05/17/12 1200 1,050 Units/hr at 05/17/12 1200  . [START ON 05/18/2012] insulin regular (NOVOLIN R,HUMULIN R) 1 Units/mL in sodium chloride 0.9 % 100 mL infusion   Intravenous To OR Vesta Mixer, MD      . Melene Muller ON 05/18/2012] magnesium sulfate (IV Push/IM) injection 40 mEq  40 mEq Other To OR Vesta Mixer, MD      . Melene Muller ON 05/18/2012] metoprolol tartrate (LOPRESSOR) tablet 12.5 mg  12.5 mg Oral Once Kerin Perna, MD      . metoprolol tartrate (LOPRESSOR) tablet 25 mg  25 mg Oral BID Kathleene Hazel, MD   25 mg at 05/17/12 1055  . morphine 2 MG/ML injection 2 mg  2 mg Intravenous Q1H PRN Kathleene Hazel, MD   2 mg at 05/17/12 0458  . nitroGLYCERIN (NITROSTAT) SL tablet 0.4 mg  0.4 mg Sublingual Q5 Min x 3 PRN Ok Anis, NP      . nitroGLYCERIN 0.2 mg/mL in dextrose 5 % infusion  2-200 mcg/min Intravenous Continuous Kathleene Hazel, MD 12 mL/hr at 05/17/12 1200 40 mcg/min at 05/17/12 1200  . [START ON 05/18/2012] nitroGLYCERIN 0.2 mg/mL in dextrose 5 % infusion  2-200 mcg/min Intravenous To OR Vesta Mixer, MD      . ondansetron [facility]) injection 4 mg  4 mg Intravenous Q6H PRN Kathleene Hazel, MD      . oxyCODONE-acetaminophen (PERCOCET/ROXICET) 5-325 MG per tablet 1-2 tablet  1-2 tablet Oral Q4H PRN Kathleene Hazel, MD   1 tablet at 05/16/12 1710  . pantoprazole (PROTONIX) EC tablet 40 mg  40 mg Oral Daily Ok Anis, NP   40 mg at  05/17/12 1055  . [START ON 05/18/2012] phenylephrine (NEO-SYNEPHRINE) 20,000 mcg in dextrose 5 % 250 mL infusion  30-200 mcg/min Intravenous To OR Vesta Mixer, MD      . polyethylene glycol (MIRALAX / GLYCOLAX) packet 17 g  17 g Oral Daily PRN Ok Anis, NP      . Melene Muller ON 05/18/2012] potassium chloride injection 80 mEq  80 mEq Other To OR Vesta Mixer, MD      . sodium chloride 0.9 % injection 3 mL  3 mL Intravenous Q12H Ok Anis, NP      . sodium chloride 0.9 % injection 3 mL  3 mL Intravenous PRN Ok Anis, NP      . temazepam (RESTORIL) capsule 15 mg  15 mg Oral Once PRN Kerin Perna, MD      . Testosterone 20.25 MG/ACT (1.62%) GEL 2 application  2 application Transdermal Daily Ok Anis, NP   2 application at 05/17/12 1229  . [START ON 05/18/2012] vancomycin (VANCOCIN) 1,250 mg in sodium chloride 0.9 % 250 mL IVPB  1,250 mg Intravenous To OR Vesta Mixer, MD        Prescriptions prior to admission  Medication Sig Dispense Refill  . amiodarone (PACERONE) 200 MG tablet Take 200 mg by mouth daily.       . ANDROGEL PUMP 20.25 MG/ACT (1.62%) GEL Place 2 application onto the skin every morning.       [name]  aspirin EC 81 MG tablet Take 81 mg by mouth every evening.      . clopidogrel (PLAVIX) 75 MG tablet Take 75 mg by mouth every evening.      . ezetimibe-simvastatin (VYTORIN) 10-20 MG per tablet Take 1 tablet by mouth at bedtime.  90 tablet  3  . isosorbide mononitrate (IMDUR) 30 MG 24 hr tablet Take 30 mg by mouth daily.      . nitroGLYCERIN (NITROSTAT) 0.4 MG SL tablet Place 0.4 mg under the tongue every 5 (five) minutes as needed for chest pain.       . pantoprazole (PROTONIX) 40 MG tablet Take 40 mg by mouth daily.      . polyethylene glycol (MIRALAX / GLYCOLAX) packet Take 17 g by mouth daily as needed (for constipation).        Family History  Problem Relation Age of Onset  . Coronary artery disease Mother   . Heart disease Mother   .  Coronary artery disease Sister   . Diabetes Sister   . Heart disease Sister 96    deceased  . Diabetes Father   . Heart disease Brother      Review of Systems:     Cardiac Review of Systems: Y or N  Chest Pain [   Y.   ]  Resting SOB [ In  ] Exertional SOB  [ Y.   ]  Orthopnea [  ]   Pedal Edema [  no   ]    Palpitations [ no   ] Syncope  [  no ]   Presyncope [No   ]  General Review of Systems: [Y] = yes [  ]=no Constitional: recent weight change [  ]; anorexia [  ]; fatigue [  ]; nausea [  ]; night sweats [  ]; fever [  ]; or chills [  ]                                                               Dental: poor dentition[  ]; Last Dentist visit: one year  Eye : blurred vision [  ]; diplopia [   ]; vision changes [  ];  Amaurosis fugax[  ]; Resp: cough [  ];  wheezing[  ];  hemoptysis[  ]; shortness of breath[  ]; paroxysmal nocturnal dyspnea[  ]; dyspnea on exertion[  ]; or orthopnea[  ];  GI:  gallstones[  ], vomiting[  ];  dysphagia[  ]; melena[  ];  hematochezia [  ]; heartburn[ Y.   ];   Hx of  Colonoscopy[  ]; GU: kidney stones [  ]; hematuria[  ];   dysuria [  ];  nocturia[  ];  history of     obstruction [  ]; urinary frequency [  ]             Skin: rash, swelling[  ];, hair loss[  ];  peripheral edema[  ];  or itching[  ]; Musculosketetal: myalgias[  ];  joint swelling[  ];  joint erythema[  ];  joint pain[  ];  back pain[  ];  Heme/Lymph: bruising[yes-Plavix   ];  bleeding[  ];  anemia[  ];  Neuro: TIA[  ];  headaches[  ];  stroke[  ];  vertigo[  ];  seizures[  ];   paresthesias[  ];  difficulty walking[  ];  Psych:depression[  ]; anxiety[  ];  Endocrine: diabetes[  ];  thyroid dysfunction[  ];  Immunizations: Flu [  ]; Pneumococcal[  ];  Other:  Physical Exam: BP 119/68  Pulse 54  Temp(Src) 97.7 F (36.5 C) (Oral)  Resp 13  Ht 6\' 1"  (1.854 m)  Wt 167 lb 15.9 oz (76.2 kg)  BMI 22.17 kg/m2  SpO2 96%  Physical exam   general appearance- normal appearing t Caucasian  male in the CCU in no acute distress  HEENT normocephalic pupils equal dentition good   neck-no JVD mass or carotid bruit Thorax-clear breath sounds no deformity or tenderness Cardiac-regular rhythm sinus rhythm by monitor no murmur or gallop Abdomen-soft nontender without pulsatile mass no organomegaly Extremities- no edema tenderness or cyanosis, no groin hematoma from cardiac cath Neurologic-alert and appropriate no focal motor deficit   Diagnostic Studies & Laboratory data:   coronary arterio grams reviewed showing severe three-vessel disease normal left main anterior hypokinesia and EF 45%  Recent Radiology Findings:   Dg Chest 2 View  05/17/2012  *RADIOLOGY REPORT*  Clinical Data: Coronary artery disease.  Hypertension.  Heart block.  Weakness.  CHEST - 2 VIEW  Comparison: 05/16/2012.  04/09/2002.  02/18/2005.  Findings: Coronary artery atherosclerosis and stents are noted on the lateral view.  There is no airspace disease.  Linear density is present in the left infrahilar region, most compatible with subsegmental atelectasis.  This was not present on the prior radiographs and the lung volumes are lower on today's study.  The cardiopericardial silhouette is within normal limits. Prominent costochondral calcification at the first rib junction. Mild aortic arch atherosclerosis.  There is no pleural effusion.  IMPRESSION: Suboptimal inspiration with linear subsegmental atelectasis in the left lung infrahilar region.  Coronary artery atherosclerosis and coronary artery stenting.  No acute abnormality.   Original Report Authenticated By: Andreas Newport, M.D.    Dg Chest 2 View  05/16/2012  *RADIOLOGY REPORT*  Clinical Data: Chest pain, headache  CHEST - 2 VIEW  Comparison: 02/18/2005; 04/09/2002  Findings: Grossly unchanged cardiac silhouette and mediastinal contours.  The lungs appear hyperexpanded with flattening of the bilateral hemidiaphragms mild diffuse thickening of the pulmonary interstitium.   No focal airspace opacity.  No pleural effusion or pneumothorax.  Unchanged bones.  IMPRESSION: Mild lung hyperexpansion of bronchitic change without acute cardiopulmonary disease.   Original Report Authenticated By: Tacey Ruiz, MD       Recent Lab Findings: Lab Results  Component Value Date   WBC 8.8 05/17/2012   HGB 12.6* 05/17/2012   HCT 35.7* 05/17/2012   PLT 181 05/17/2012   GLUCOSE 114* 05/16/2012   CHOL 139 04/03/2012   TRIG 89.0 04/03/2012   HDL 44.00 04/03/2012   LDLCALC 77 04/03/2012   ALT 18 04/03/2012   AST 22 04/03/2012   NA 139 05/16/2012   K 4.1 05/16/2012   CL 102 05/16/2012   CREATININE 1.10 05/16/2012   BUN 19 05/16/2012   CO2 22 05/16/2012   TSH 0.82 05/30/2011   INR 1.13 05/16/2012   HGBA1C 5.7 05/30/2011      Assessment / Plan:       79 year old male with unstable angina three-vessel CAD   he has had prior PCI of his LAD and RCA has been on Plavix. P2 Y. 12 assay shows adequate platelet function for CABG   patient has had history of  PAF for the past few years maintaining sinus rhythm mainly with oral amiodarone and he has had difficulty with anticoagulation issues for his atrial fib    the patient would benefit from multivessel bypass grafting to the LAD, posterior descending, OM1 and OM 2 as well as combined maze procedure for atrial fibrillation. I discussed the details of the surgery the patient is wife including the location of the incision, use of general anesthesia and cardiopulmonary bypass, and expected recovery period I've discussed with him the potential risks including risks of stroke, heart block with pacemaker requirement, wound infection, MI, bleeding, and death. He understands and agrees to proceed with surgery.       @ME1 @ 05/17/2012 2:55 PM

## 2012-05-17 NOTE — Progress Notes (Signed)
ANTICOAGULATION CONSULT NOTE  Pharmacy Consult for heparin Indication: chest pain/ACS  Allergies  Allergen Reactions  . Ambien (Zolpidem Tartrate) Other (See Comments)    Super sensitive.   . Statins     REACTION: muscle aches...crestor  . Zolpidem Tartrate     Patient Measurements: Height: 6\' 1"  (185.4 cm) Weight: 167 lb 15.9 oz (76.2 kg) IBW/kg (Calculated) : 79.9 Heparin Dosing Weight: 77  Vital Signs: Temp: 98.3 F (36.8 C) (05/08 0400) Temp src: Oral (05/08 0400) BP: 114/61 mmHg (05/08 0600) Pulse Rate: 54 (05/08 0600)  Labs:  Recent Labs  05/16/12 1111 05/16/12 1836 05/16/12 1842 05/16/12 2341 05/17/12 0550  HGB 13.3 12.6*  --   --  12.6*  HCT 36.8* 36.5*  --   --  35.7*  PLT 193 180  --   --  181  LABPROT  --  14.3  --   --   --   INR  --  1.13  --   --   --   HEPARINUNFRC  --   --   --   --  0.41  CREATININE 1.21 1.18  --  1.10  --   TROPONINI  --   --  <0.30 <0.30 <0.30    Estimated Creatinine Clearance: 60.6 ml/min (by C-G formula based on Cr of 1.1).  Assessment: 79 yo with severe CAD, awaiting possible CABG, for heparin   Goal of Therapy:  Heparin level 0.3-0.7 units/ml Monitor platelets by anticoagulation protocol: Yes   Plan:  Continue Heparin at current rate   Eddie Candle 05/17/2012,7:02 AM

## 2012-05-17 NOTE — Care Management Note (Addendum)
    Page 1 of 2   05/23/2012     5:01:48 PM   CARE MANAGEMENT NOTE 05/23/2012  Patient:  [name], [name]   Account Number:  [id number]  Date Initiated:  05/17/2012  Documentation initiated by:  Junius Creamer  Subjective/Objective Assessment:   adm w ch pain     Action/Plan:   lives w wife, pcp dr Victorino Dike walker   Anticipated DC Date:  05/23/2012   Anticipated DC Plan:  HOME/SELF CARE      DC Planning Services  CM consult      [facility] Choice  HOME HEALTH   Choice offered to / List presented to:  C-1 Patient   DME arranged  BEDSIDE COMMODE      DME agency  [facility]     [facility] arranged  HH-1 RN      [facility] agency  [facility]   Status of service:  Completed, signed off Medicare Important Message given?   (If response is "NO", the following Medicare IM given date fields will be blank) Date Medicare IM given:   Date Additional Medicare IM given:    Discharge Disposition:  HOME W HOME HEALTH SERVICES  Per UR Regulation:  Reviewed for med. necessity/level of care/duration of stay  If discussed at Long Length of Stay Meetings, dates discussed:    Comments:  05/23/12 Angelmarie Ponzo,RN,BSN [phone] REFERRAL TO [facility], PER PT CHOICE, FOR HH FOLLOW UP.  REQUESTS [facility] FOR HOME; HAS ACCESS TO RW.  START OF CARE FOR HH 24-48H POST DC DATE.

## 2012-05-17 NOTE — Telephone Encounter (Signed)
Patient has been readmitted back to the hospital

## 2012-05-17 NOTE — Progress Notes (Addendum)
Pre-op Cardiac Surgery  Carotid Findings:  Bilateral:  No evidence of hemodynamically significant internal carotid artery stenosis.   Vertebral artery flow is antegrade.     Upper Extremity Right Left  Brachial Pressures 131 126  Radial Waveforms Tri Tri  Ulnar Waveforms Tri Tri  Palmar Arch (Allen's Test) Obliterates with radial compression, normal with ulnar compression Obliterates with radial compression, normal with ulnar compression    Palpable pedal pulses.  Farrel Demark, RDMS, RVT 05/17/2012

## 2012-05-17 NOTE — Progress Notes (Signed)
1100-1130 Education completed with pt about importance of mobility. Discussed sternal precautions and progression of activity. Pt has seen preop video, has IS, and has surgical booklet. Will follow up after CABG. Did not walk pt as he had CP this a.m. Luetta Nutting RNBSN

## 2012-05-17 NOTE — Progress Notes (Signed)
Subjective:  The patient was admitted overnight with chest pain, and cardiac cath showed severe 3-vessel disease.  The patient notes a few episodes of chest pain overnight since the procedure, accompanied by anxiety, though with no SOB, nausea, or diaphoresis.  Objective:  Vital Signs in the last 24 hours: Temp:  [97.7 F (36.5 C)-98.4 F (36.9 C)] 98.3 F (36.8 C) (05/08 0400) Pulse Rate:  [40-69] 54 (05/08 0600) Resp:  [11-19] 12 (05/08 0600) BP: (99-171)/(52-82) 114/61 mmHg (05/08 0600) SpO2:  [94 %-100 %] 99 % (05/08 0600) Weight:  [166 lb 0.1 oz (75.3 kg)-169 lb (76.658 kg)] 167 lb 15.9 oz (76.2 kg) (05/08 0500)  Intake/Output from previous day: Jun 11, 2022 0701 - 05/08 0700 In: 644.3 [P.O.:160; I.V.:484.3] Out: 300 [Urine:300]  Physical Exam: General: lying in bed, alert, but appears to have poor insight into why he is here HEENT: pupils equal round and reactive to light, vision grossly intact, oropharynx clear and non-erythematous  Neck: supple, no lymphadenopathy, no JVD Lungs: clear to ascultation bilaterally, normal work of respiration, no wheezes, rales, ronchi Heart: bradycardic, regular rhythm, no m/g/r Abdomen: soft, non-tender, non-distended, normal bowel sounds Back  Tender to pressing on R lower back. Extremities: no cyanosis, clubbing, or edema Neurologic: alert & oriented X3, cranial nerves II-XII intact, strength grossly intact, sensation intact to light touch  Lab Results:  Recent Labs  06/10/12 1836 05/17/12 0550  WBC 8.0 8.8  HGB 12.6* 12.6*  PLT 180 181    Recent Labs  June 10, 2012 1111 2012-06-10 1836 June 10, 2012 2341  NA 135  --  139  K 4.3  --  4.1  CL 104  --  102  CO2 23  --  22  GLUCOSE 160*  --  114*  BUN 22  --  19  CREATININE 1.21 1.18 1.10    Recent Labs  June 10, 2012 2341 05/17/12 0550  TROPONINI <0.30 <0.30    Cardiac Studies: Cardiac Catheterization June 10, 2012 Hemodynamic Findings:  Central aortic pressure: 154/73  Left  ventricular pressure: 157/8/17  Angiographic Findings:  Left main: No obstructive disease  Left Anterior Descending Artery: Large caliber vessel that courses the apex. The proximal vessel has diffuse 50% stenosis. The mid vessel has a stented segment with 50% proximal stenosis followed by a focal 99% stenosis in the mid portion of the stented segment. The first diagonal branch is small in caliber with 50% ostial stenosis. The distal LAD has diffuse plaque.  Circumflex Artery: Large caliber vessel with two large obtuse marginal branches. The first OM branch has 70% focal stenosis. The second OM branch has a focal 80% stenosis.  Right Coronary Artery: Very large dominant vessel with 80% proximal stenosis. The stented segment of the proximal vessel has mild diffuse restenosis. The mid vessel has diffuse 40% stenosis.  Left Ventricular Angiogram: LVEF 45% with hypokinesis of the anterior Hoaglund.  Impression:  1. Severe triple vessel CAD with restenosis in mid LAD stent  2. Mild to moderate LV systolic dysfunction  3. Unstable angina  Echocardiogram 02/13/12 - Left ventricle: The cavity size was normal. There was mild focal basal and moderate concentric hypertrophy of the septum. Systolic function was normal. The estimated ejection fraction was in the range of 55% to 60%. Dyar motion was normal; there were no regional Livas motion abnormalities. Doppler parameters are consistent with abnormal left ventricular relaxation (grade 1 diastolic dysfunction). - Aortic valve: Mild regurgitation.  Tele:  Sinus bradycardia with HR in 40-50's  Assessment/Plan:  The patient is a  79 yo man, history of CAD, presenting with chest pain, found to have severe 3-vessel disease.  # Obstructive CAD - cardiac cath 5/7 showed stenosis in LAD, RCA, and circumflex.  Troponins have remained negative Consulted CT surgery for consideration for CABG.  Patient had 1 episode of chest tightness earlier this AM  Now  gone -continue nitro drip -continue heparin dirp -continue aspirin -continue vytorin -continue metoprolol -morphine prn for pain -checking p2y12, result pending  # Paroxysmal afib - CHADS2VASc score = 4.  Patient has previously decided to discontinue pradaxa due to personal preference and cost.  On outpatient Amiodarone.  Patient currently in sinus rhythm. -continue amiodarone 200 mg daily  # HTN - BP's stable overnight -conitnue metoprolol, nitro drip  # Hyperlipidemia -continue vytorin  Back Pain  Patient with back pain on R lower back.  Has had for a couple months  Seen by IM  Felt to be more muscluloskeletal  UA is without blood.  FOllow  Janalyn Harder, M.D. 05/17/2012, 7:06 AM  Patient seen and examined  Agree with findings as noted above by Dr Manson Passey  I have amended note to reflect my findings.  Continue current regimen  CV surgery will see patent Dietrich Pates

## 2012-05-18 ENCOUNTER — Inpatient Hospital Stay (HOSPITAL_COMMUNITY): Payer: Medicare Other | Admitting: Anesthesiology

## 2012-05-18 ENCOUNTER — Encounter (HOSPITAL_COMMUNITY): Payer: Self-pay | Admitting: Anesthesiology

## 2012-05-18 ENCOUNTER — Encounter (HOSPITAL_COMMUNITY)
Admission: EM | Disposition: A | Discharge status: Home Health | Payer: Self-pay | Source: Home / Self Care | Attending: Cardiothoracic Surgery

## 2012-05-18 ENCOUNTER — Telehealth: Payer: Self-pay

## 2012-05-18 ENCOUNTER — Ambulatory Visit: Payer: Medicare Other | Admitting: Adult Health

## 2012-05-18 ENCOUNTER — Inpatient Hospital Stay (HOSPITAL_COMMUNITY): Payer: Medicare Other

## 2012-05-18 DIAGNOSIS — I4891 Unspecified atrial fibrillation: Secondary | ICD-10-CM

## 2012-05-18 DIAGNOSIS — I2510 Atherosclerotic heart disease of native coronary artery without angina pectoris: Secondary | ICD-10-CM

## 2012-05-18 HISTORY — PX: CLIPPING OF ATRIAL APPENDAGE: SHX5773

## 2012-05-18 HISTORY — PX: MAZE: SHX5063

## 2012-05-18 HISTORY — PX: CORONARY ARTERY BYPASS GRAFT: SHX141

## 2012-05-18 HISTORY — PX: INTRAOPERATIVE TRANSESOPHAGEAL ECHOCARDIOGRAM: SHX5062

## 2012-05-18 LAB — CBC
HCT: 31.2 % — ABNORMAL LOW (ref 39.0–52.0)
HCT: 25.2 % — ABNORMAL LOW (ref 39.0–52.0)
Hemoglobin: 10.9 g/dL — ABNORMAL LOW (ref 13.0–17.0)
Hemoglobin: 9 g/dL — ABNORMAL LOW (ref 13.0–17.0)
MCH: 29 pg (ref 26.0–34.0)
MCHC: 35.7 g/dL (ref 30.0–36.0)
MCV: 81.7 fL (ref 78.0–100.0)
MCV: 81.3 fL (ref 78.0–100.0)
Platelets: 168 10*3/uL (ref 150–400)
Platelets: 106 10*3/uL — ABNORMAL LOW (ref 150–400)
RBC: 3.1 MIL/uL — ABNORMAL LOW (ref 4.22–5.81)
RDW: 13.1 % (ref 11.5–15.5)
RDW: 13.1 % (ref 11.5–15.5)
RDW: 13.4 % (ref 11.5–15.5)
WBC: 9.7 10*3/uL (ref 4.0–10.5)
WBC: 13.4 10*3/uL — ABNORMAL HIGH (ref 4.0–10.5)
WBC: 12.8 10*3/uL — ABNORMAL HIGH (ref 4.0–10.5)

## 2012-05-18 LAB — POCT I-STAT 3, VENOUS BLOOD GAS (G3P V)
Bicarbonate: 23 mEq/L (ref 20.0–24.0)
TCO2: 24 mmol/L (ref 0–100)
pH, Ven: 7.419 — ABNORMAL HIGH (ref 7.250–7.300)
pO2, Ven: 34 mmHg (ref 30.0–45.0)

## 2012-05-18 LAB — POCT I-STAT 3, ART BLOOD GAS (G3+)
Acid-base deficit: 3 mmol/L — ABNORMAL HIGH (ref 0.0–2.0)
Acid-base deficit: 2 mmol/L (ref 0.0–2.0)
Bicarbonate: 23.6 mEq/L (ref 20.0–24.0)
O2 Saturation: 100 %
O2 Saturation: 100 %
O2 Saturation: 93 %
Patient temperature: 33
TCO2: 26 mmol/L (ref 0–100)
TCO2: 24 mmol/L (ref 0–100)
pCO2 arterial: 34 mmHg — ABNORMAL LOW (ref 35.0–45.0)
pCO2 arterial: 42.2 mmHg (ref 35.0–45.0)
pO2, Arterial: 306 mmHg — ABNORMAL HIGH (ref 80.0–100.0)
pO2, Arterial: 66 mmHg — ABNORMAL LOW (ref 80.0–100.0)

## 2012-05-18 LAB — POCT I-STAT 4, (NA,K, GLUC, HGB,HCT)
Glucose, Bld: 109 mg/dL — ABNORMAL HIGH (ref 70–99)
Glucose, Bld: 121 mg/dL — ABNORMAL HIGH (ref 70–99)
Glucose, Bld: 127 mg/dL — ABNORMAL HIGH (ref 70–99)
HCT: 35 % — ABNORMAL LOW (ref 39.0–52.0)
HCT: 26 % — ABNORMAL LOW (ref 39.0–52.0)
HCT: 32 % — ABNORMAL LOW (ref 39.0–52.0)
Hemoglobin: 9.9 g/dL — ABNORMAL LOW (ref 13.0–17.0)
Hemoglobin: 7.5 g/dL — ABNORMAL LOW (ref 13.0–17.0)
Hemoglobin: 8.5 g/dL — ABNORMAL LOW (ref 13.0–17.0)
Potassium: 3.8 mEq/L (ref 3.5–5.1)
Potassium: 4.5 mEq/L (ref 3.5–5.1)
Potassium: 3.7 mEq/L (ref 3.5–5.1)
Sodium: 138 mEq/L (ref 135–145)
Sodium: 136 mEq/L (ref 135–145)
Sodium: 137 mEq/L (ref 135–145)

## 2012-05-18 LAB — POCT I-STAT GLUCOSE
Glucose, Bld: 110 mg/dL — ABNORMAL HIGH (ref 70–99)
Glucose, Bld: 115 mg/dL — ABNORMAL HIGH (ref 70–99)
Operator id: 3390
Operator id: 3390

## 2012-05-18 LAB — PLATELET COUNT: Platelets: 108 10*3/uL — ABNORMAL LOW (ref 150–400)

## 2012-05-18 LAB — BASIC METABOLIC PANEL
BUN: 16 mg/dL (ref 6–23)
CO2: 27 mEq/L (ref 19–32)
Calcium: 9.4 mg/dL (ref 8.4–10.5)
Chloride: 99 mEq/L (ref 96–112)
Creatinine, Ser: 1.05 mg/dL (ref 0.50–1.35)
GFR calc Af Amer: 77 mL/min — ABNORMAL LOW (ref >90)
GFR calc non Af Amer: 66 mL/min — ABNORMAL LOW (ref >90)
Glucose, Bld: 115 mg/dL — ABNORMAL HIGH (ref 70–99)
Potassium: 4 mEq/L (ref 3.5–5.1)
Sodium: 134 mEq/L — ABNORMAL LOW (ref 135–145)

## 2012-05-18 LAB — CREATININE, SERUM
Creatinine, Ser: 1.02 mg/dL (ref 0.50–1.35)
GFR calc Af Amer: 80 mL/min — ABNORMAL LOW (ref >90)
GFR calc non Af Amer: 69 mL/min — ABNORMAL LOW (ref >90)

## 2012-05-18 LAB — PROTIME-INR: INR: 1.54 — ABNORMAL HIGH (ref 0.00–1.49)

## 2012-05-18 LAB — POCT I-STAT, CHEM 8
BUN: 12 mg/dL (ref 6–23)
Chloride: 103 mEq/L (ref 96–112)
Creatinine, Ser: 0.9 mg/dL (ref 0.50–1.35)
Potassium: 4.2 mEq/L (ref 3.5–5.1)
Sodium: 137 mEq/L (ref 135–145)
TCO2: 24 mmol/L (ref 0–100)

## 2012-05-18 LAB — HEPARIN LEVEL (UNFRACTIONATED): Heparin Unfractionated: 0.33 IU/mL (ref 0.30–0.70)

## 2012-05-18 LAB — HEMOGLOBIN AND HEMATOCRIT, BLOOD
HCT: 26.2 % — ABNORMAL LOW (ref 39.0–52.0)
Hemoglobin: 9.3 g/dL — ABNORMAL LOW (ref 13.0–17.0)

## 2012-05-18 LAB — MAGNESIUM: Magnesium: 3 mg/dL — ABNORMAL HIGH (ref 1.5–2.5)

## 2012-05-18 LAB — PREPARE RBC (CROSSMATCH)

## 2012-05-18 SURGERY — CORONARY ARTERY BYPASS GRAFTING (CABG)
Anesthesia: General | Site: Chest | Wound class: Clean

## 2012-05-18 MED ORDER — ASPIRIN 81 MG PO CHEW: 324.0000 mg | CHEWABLE_TABLET | Freq: Every day | ORAL | Status: DC

## 2012-05-18 MED ORDER — ACETAMINOPHEN 10 MG/ML IV SOLN
1000.0000 mg | Freq: Once | INTRAVENOUS | Status: AC
  Administered 2012-05-18: 1000 mg via INTRAVENOUS
  Filled 2012-05-18: qty 100

## 2012-05-18 MED ORDER — MIDAZOLAM HCL 2 MG/2ML IJ SOLN
2.0000 mg | INTRAMUSCULAR | Status: DC | PRN
  Administered 2012-05-19: 1 mg via INTRAVENOUS
  Filled 2012-05-18: qty 2

## 2012-05-18 MED ORDER — PROTAMINE SULFATE 10 MG/ML IV SOLN
INTRAVENOUS | Status: DC | PRN
  Administered 2012-05-18: 330 mg via INTRAVENOUS

## 2012-05-18 MED ORDER — 0.9 % SODIUM CHLORIDE (POUR BTL) OPTIME
TOPICAL | Status: DC | PRN
  Administered 2012-05-18: 1000 mL

## 2012-05-18 MED ORDER — ASPIRIN EC 325 MG PO TBEC
325.0000 mg | DELAYED_RELEASE_TABLET | Freq: Every day | ORAL | Status: DC
  Administered 2012-05-19 – 2012-05-21 (×3): 325 mg via ORAL
  Filled 2012-05-18 (×4): qty 1

## 2012-05-18 MED ORDER — PANTOPRAZOLE SODIUM 40 MG PO TBEC
40.0000 mg | DELAYED_RELEASE_TABLET | Freq: Every day | ORAL | Status: DC
  Administered 2012-05-20 – 2012-05-22 (×3): 40 mg via ORAL
  Filled 2012-05-18 (×2): qty 1

## 2012-05-18 MED ORDER — SODIUM CHLORIDE 0.9 % IV SOLN: INTRAVENOUS | Status: DC

## 2012-05-18 MED ORDER — AMIODARONE HCL IN DEXTROSE 360-4.14 MG/200ML-% IV SOLN
30.0000 mg/h | INTRAVENOUS | Status: DC
  Filled 2012-05-18 (×3): qty 200

## 2012-05-18 MED ORDER — METOPROLOL TARTRATE 1 MG/ML IV SOLN: 2.5000 mg | INTRAVENOUS | Status: DC | PRN

## 2012-05-18 MED ORDER — OXYCODONE HCL 5 MG PO TABS
5.0000 mg | ORAL_TABLET | ORAL | Status: DC | PRN
  Administered 2012-05-19: 10 mg via ORAL
  Filled 2012-05-18: qty 2

## 2012-05-18 MED ORDER — LACTATED RINGERS IV SOLN
500.0000 mL | Freq: Once | INTRAVENOUS | Status: AC | PRN
  Administered 2012-05-18: 500 mL via INTRAVENOUS

## 2012-05-18 MED ORDER — SODIUM CHLORIDE 0.9 % IV SOLN
20.0000 mg | INTRAVENOUS | Status: DC | PRN
  Administered 2012-05-18: 10 ug/min via INTRAVENOUS

## 2012-05-18 MED ORDER — LACTATED RINGERS IV SOLN: INTRAVENOUS | Status: DC

## 2012-05-18 MED ORDER — PLASMA-LYTE 148 IV SOLN
INTRAVENOUS | Status: DC
  Filled 2012-05-18: qty 2.5

## 2012-05-18 MED ORDER — PROPOFOL 10 MG/ML IV BOLUS
INTRAVENOUS | Status: DC | PRN
  Administered 2012-05-18: 80 mg via INTRAVENOUS

## 2012-05-18 MED ORDER — SODIUM CHLORIDE 0.9 % IV SOLN
INTRAVENOUS | Status: DC | PRN
  Administered 2012-05-18: 08:00:00 via INTRAVENOUS

## 2012-05-18 MED ORDER — BISACODYL 10 MG RE SUPP
10.0000 mg | Freq: Every day | RECTAL | Status: DC
  Administered 2012-05-23: 10 mg via RECTAL
  Filled 2012-05-18: qty 1

## 2012-05-18 MED ORDER — METOCLOPRAMIDE HCL 5 MG/ML IJ SOLN
10.0000 mg | Freq: Four times a day (QID) | INTRAMUSCULAR | Status: AC
  Administered 2012-05-18 – 2012-05-19 (×4): 10 mg via INTRAVENOUS
  Filled 2012-05-18 (×4): qty 2

## 2012-05-18 MED ORDER — AMIODARONE HCL IN DEXTROSE 360-4.14 MG/200ML-% IV SOLN
30.0000 mg/h | INTRAVENOUS | Status: DC
  Administered 2012-05-18: 30 mg/h via INTRAVENOUS
  Filled 2012-05-18 (×3): qty 200

## 2012-05-18 MED ORDER — SODIUM CHLORIDE 0.9 % IV SOLN
INTRAVENOUS | Status: DC | PRN
  Administered 2012-05-18: 14:00:00 via INTRAVENOUS

## 2012-05-18 MED ORDER — BISACODYL 5 MG PO TBEC
10.0000 mg | DELAYED_RELEASE_TABLET | Freq: Every day | ORAL | Status: DC
  Administered 2012-05-19 – 2012-05-21 (×2): 10 mg via ORAL
  Filled 2012-05-18 (×2): qty 2

## 2012-05-18 MED ORDER — INSULIN REGULAR BOLUS VIA INFUSION
0.0000 [IU] | Freq: Three times a day (TID) | INTRAVENOUS | Status: DC
  Administered 2012-05-19: 2 [IU] via INTRAVENOUS
  Filled 2012-05-18: qty 10

## 2012-05-18 MED ORDER — ARTIFICIAL TEARS OP OINT
TOPICAL_OINTMENT | OPHTHALMIC | Status: DC | PRN
  Administered 2012-05-18: 1 via OPHTHALMIC

## 2012-05-18 MED ORDER — DOCUSATE SODIUM 100 MG PO CAPS
200.0000 mg | ORAL_CAPSULE | Freq: Every day | ORAL | Status: DC
  Administered 2012-05-19 – 2012-05-21 (×3): 200 mg via ORAL
  Filled 2012-05-18: qty 2
  Filled 2012-05-18 (×2): qty 1
  Filled 2012-05-18 (×2): qty 2

## 2012-05-18 MED ORDER — METOPROLOL TARTRATE 12.5 MG HALF TABLET
12.5000 mg | ORAL_TABLET | Freq: Two times a day (BID) | ORAL | Status: DC
  Filled 2012-05-18 (×3): qty 1

## 2012-05-18 MED ORDER — SODIUM CHLORIDE 0.9 % IV SOLN
INTRAVENOUS | Status: DC
  Filled 2012-05-18: qty 1

## 2012-05-18 MED ORDER — MORPHINE SULFATE 2 MG/ML IJ SOLN
2.0000 mg | INTRAMUSCULAR | Status: DC | PRN
Start: 2012-05-18 — End: 2012-05-20
  Administered 2012-05-19: 2 mg via INTRAVENOUS
  Filled 2012-05-18: qty 1

## 2012-05-18 MED ORDER — ALBUMIN HUMAN 5 % IV SOLN
250.0000 mL | INTRAVENOUS | Status: AC | PRN
  Administered 2012-05-18 (×2): 250 mL via INTRAVENOUS

## 2012-05-18 MED ORDER — FENTANYL CITRATE 0.05 MG/ML IJ SOLN
INTRAMUSCULAR | Status: DC | PRN
  Administered 2012-05-18: 100 ug via INTRAVENOUS
  Administered 2012-05-18 (×2): 250 ug via INTRAVENOUS
  Administered 2012-05-18: 900 ug via INTRAVENOUS
  Administered 2012-05-18: 250 ug via INTRAVENOUS
  Administered 2012-05-18: 100 ug via INTRAVENOUS
  Administered 2012-05-18: 50 ug via INTRAVENOUS
  Administered 2012-05-18: 250 ug via INTRAVENOUS

## 2012-05-18 MED ORDER — POTASSIUM CHLORIDE 10 MEQ/50ML IV SOLN
10.0000 meq | INTRAVENOUS | Status: AC
  Administered 2012-05-18 (×3): 10 meq via INTRAVENOUS

## 2012-05-18 MED ORDER — DOPAMINE-DEXTROSE 3.2-5 MG/ML-% IV SOLN: 0.0000 ug/kg/min | INTRAVENOUS | Status: DC

## 2012-05-18 MED ORDER — PHENYLEPHRINE HCL 10 MG/ML IJ SOLN
0.0000 ug/min | INTRAVENOUS | Status: DC
  Filled 2012-05-18: qty 2

## 2012-05-18 MED ORDER — AMIODARONE HCL IN DEXTROSE 360-4.14 MG/200ML-% IV SOLN
60.0000 mg/h | INTRAVENOUS | Status: DC
  Administered 2012-05-18: 30 mg/h via INTRAVENOUS
  Filled 2012-05-18: qty 200

## 2012-05-18 MED ORDER — HEMOSTATIC AGENTS (NO CHARGE) OPTIME
TOPICAL | Status: DC | PRN
  Administered 2012-05-18: 1 via TOPICAL

## 2012-05-18 MED ORDER — FAMOTIDINE IN NACL 20-0.9 MG/50ML-% IV SOLN
20.0000 mg | Freq: Two times a day (BID) | INTRAVENOUS | Status: AC
  Administered 2012-05-18 (×2): 20 mg via INTRAVENOUS
  Filled 2012-05-18: qty 50

## 2012-05-18 MED ORDER — AMINOCAPROIC ACID 250 MG/ML IV SOLN
INTRAVENOUS | Status: DC
  Filled 2012-05-18: qty 40

## 2012-05-18 MED ORDER — VECURONIUM BROMIDE 10 MG IV SOLR
INTRAVENOUS | Status: DC | PRN
  Administered 2012-05-18: 10 mg via INTRAVENOUS
  Administered 2012-05-18 (×2): 5 mg via INTRAVENOUS
  Administered 2012-05-18 (×2): 10 mg via INTRAVENOUS
  Administered 2012-05-18: 5 mg via INTRAVENOUS
  Administered 2012-05-18: 10 mg via INTRAVENOUS

## 2012-05-18 MED ORDER — LIDOCAINE HCL (CARDIAC) 20 MG/ML IV SOLN
INTRAVENOUS | Status: DC | PRN
  Administered 2012-05-18: 50 mg via INTRAVENOUS

## 2012-05-18 MED ORDER — FENTANYL CITRATE 0.05 MG/ML IJ SOLN
INTRAMUSCULAR | Status: AC
  Filled 2012-05-18: qty 2

## 2012-05-18 MED ORDER — MIDAZOLAM HCL 5 MG/5ML IJ SOLN
INTRAMUSCULAR | Status: DC | PRN
  Administered 2012-05-18 (×2): 3 mg via INTRAVENOUS
  Administered 2012-05-18 (×2): 2 mg via INTRAVENOUS
  Administered 2012-05-18: 3 mg via INTRAVENOUS
  Administered 2012-05-18 (×2): 2 mg via INTRAVENOUS
  Administered 2012-05-18: 3 mg via INTRAVENOUS

## 2012-05-18 MED ORDER — METOPROLOL TARTRATE 25 MG/10 ML ORAL SUSPENSION
12.5000 mg | Freq: Two times a day (BID) | ORAL | Status: DC
  Filled 2012-05-18 (×3): qty 5

## 2012-05-18 MED ORDER — DEXMEDETOMIDINE HCL IN NACL 400 MCG/100ML IV SOLN
0.1000 ug/kg/h | INTRAVENOUS | Status: DC
  Filled 2012-05-18: qty 100

## 2012-05-18 MED ORDER — DEXTROSE 5 % IV SOLN
1.5000 g | Freq: Two times a day (BID) | INTRAVENOUS | Status: AC
  Administered 2012-05-18 – 2012-05-20 (×4): 1.5 g via INTRAVENOUS
  Filled 2012-05-18 (×4): qty 1.5

## 2012-05-18 MED ORDER — MAGNESIUM SULFATE 40 MG/ML IJ SOLN
4.0000 g | Freq: Once | INTRAMUSCULAR | Status: AC
  Administered 2012-05-18: 4 g via INTRAVENOUS
  Filled 2012-05-18: qty 100

## 2012-05-18 MED ORDER — ALBUMIN HUMAN 5 % IV SOLN
INTRAVENOUS | Status: DC | PRN
  Administered 2012-05-18 (×2): via INTRAVENOUS

## 2012-05-18 MED ORDER — DEXMEDETOMIDINE HCL IN NACL 200 MCG/50ML IV SOLN
0.1000 ug/kg/h | INTRAVENOUS | Status: DC
  Administered 2012-05-19: 0.7 ug/kg/h via INTRAVENOUS
  Filled 2012-05-18 (×2): qty 50

## 2012-05-18 MED ORDER — ONDANSETRON HCL 4 MG/2ML IJ SOLN: 4.0000 mg | Freq: Four times a day (QID) | INTRAMUSCULAR | Status: DC | PRN

## 2012-05-18 MED ORDER — NITROGLYCERIN IN D5W 200-5 MCG/ML-% IV SOLN: 0.0000 ug/min | INTRAVENOUS | Status: DC

## 2012-05-18 MED ORDER — ACETAMINOPHEN 160 MG/5ML PO SOLN
975.0000 mg | Freq: Four times a day (QID) | ORAL | Status: DC
  Filled 2012-05-18: qty 40.6

## 2012-05-18 MED ORDER — SODIUM CHLORIDE 0.9 % IV SOLN
250.0000 mL | INTRAVENOUS | Status: DC
  Administered 2012-05-19: 250 mL via INTRAVENOUS

## 2012-05-18 MED ORDER — SODIUM CHLORIDE 0.45 % IV SOLN
INTRAVENOUS | Status: DC
  Administered 2012-05-18: 16:00:00 via INTRAVENOUS

## 2012-05-18 MED ORDER — SODIUM CHLORIDE 0.9 % IJ SOLN
3.0000 mL | Freq: Two times a day (BID) | INTRAMUSCULAR | Status: DC
  Administered 2012-05-19: 3 mL via INTRAVENOUS
  Administered 2012-05-19: 9 mL via INTRAVENOUS
  Administered 2012-05-20 (×2): 3 mL via INTRAVENOUS

## 2012-05-18 MED ORDER — SODIUM CHLORIDE 0.9 % IJ SOLN
OROMUCOSAL | Status: DC | PRN
  Administered 2012-05-18 (×2): via TOPICAL

## 2012-05-18 MED ORDER — SODIUM CHLORIDE 0.9 % IJ SOLN: 3.0000 mL | INTRAMUSCULAR | Status: DC | PRN

## 2012-05-18 MED ORDER — ROCURONIUM BROMIDE 100 MG/10ML IV SOLN
INTRAVENOUS | Status: DC | PRN
  Administered 2012-05-18: 50 mg via INTRAVENOUS

## 2012-05-18 MED ORDER — GLYCOPYRROLATE 0.2 MG/ML IJ SOLN
INTRAMUSCULAR | Status: DC | PRN
  Administered 2012-05-18 (×2): 0.1 mg via INTRAVENOUS

## 2012-05-18 MED ORDER — HEPARIN SODIUM (PORCINE) 1000 UNIT/ML IJ SOLN
INTRAMUSCULAR | Status: DC | PRN
  Administered 2012-05-18: 31000 [IU] via INTRAVENOUS
  Administered 2012-05-18: 5000 [IU] via INTRAVENOUS

## 2012-05-18 MED ORDER — LACTATED RINGERS IV SOLN
INTRAVENOUS | Status: DC | PRN
  Administered 2012-05-18: 06:00:00 via INTRAVENOUS

## 2012-05-18 MED ORDER — MORPHINE SULFATE 2 MG/ML IJ SOLN: 1.0000 mg | INTRAMUSCULAR | Status: AC | PRN

## 2012-05-18 MED ORDER — ACETAMINOPHEN 500 MG PO TABS
1000.0000 mg | ORAL_TABLET | Freq: Four times a day (QID) | ORAL | Status: DC
  Administered 2012-05-19 – 2012-05-23 (×14): 1000 mg via ORAL
  Filled 2012-05-18 (×18): qty 2

## 2012-05-18 MED ORDER — VANCOMYCIN HCL IN DEXTROSE 1-5 GM/200ML-% IV SOLN
1000.0000 mg | Freq: Once | INTRAVENOUS | Status: AC
  Administered 2012-05-18: 1000 mg via INTRAVENOUS
  Filled 2012-05-18: qty 200

## 2012-05-18 MED ORDER — AMIODARONE LOAD VIA INFUSION
150.0000 mg | Freq: Once | INTRAVENOUS | Status: DC
  Filled 2012-05-18: qty 83.34

## 2012-05-18 MED ORDER — DEXTROSE 5 % IV SOLN
INTRAVENOUS | Status: DC | PRN
  Administered 2012-05-18: 08:00:00 via INTRAVENOUS

## 2012-05-18 SURGICAL SUPPLY — 130 items
ADAPTER CARDIO PERF ANTE/RETRO (ADAPTER) ×5 IMPLANT
ADPR PRFSN 84XANTGRD RTRGD (ADAPTER) ×3
ANTEGRADE CPLG (MISCELLANEOUS) ×2 IMPLANT
ATTRACTOMAT 16X20 MAGNETIC DRP (DRAPES) ×5 IMPLANT
BAG DECANTER FOR FLEXI CONT (MISCELLANEOUS) ×5 IMPLANT
BANDAGE ELASTIC 4 VELCRO ST LF (GAUZE/BANDAGES/DRESSINGS) ×5 IMPLANT
BANDAGE ELASTIC 6 VELCRO ST LF (GAUZE/BANDAGES/DRESSINGS) ×5 IMPLANT
BANDAGE GAUZE ELAST BULKY 4 IN (GAUZE/BANDAGES/DRESSINGS) ×5 IMPLANT
BASKET HEART  (ORDER IN 25'S) (MISCELLANEOUS) ×1
BASKET HEART (ORDER IN 25'S) (MISCELLANEOUS) ×1
BASKET HEART (ORDER IN 25S) (MISCELLANEOUS) ×3 IMPLANT
BLADE STERNUM SYSTEM 6 (BLADE) ×5 IMPLANT
BLADE SURG 11 STRL SS (BLADE) ×2 IMPLANT
BLADE SURG 12 STRL SS (BLADE) ×5 IMPLANT
BLADE SURG ROTATE 9660 (MISCELLANEOUS) IMPLANT
CANISTER SUCTION 2500CC (MISCELLANEOUS) ×5 IMPLANT
CANNULA AORTIC HI-FLOW 6.5M20F (CANNULA) ×5 IMPLANT
CANNULA GUNDRY RCSP 15FR (MISCELLANEOUS) ×5 IMPLANT
CANNULA RT ANGLE VENOUS 34FR (CANNULA) IMPLANT
CANNULA VENOUS LOW PROF 32X40 (CANNULA) ×2 IMPLANT
CANNULA VENOUS MAL SGL STG 40 (MISCELLANEOUS) IMPLANT
CANNULA VRC MALB SNGL STG 30FR (MISCELLANEOUS) IMPLANT
CANNULAE RT ANGLE VENOUS 34FR (CANNULA) ×5
CANNULAE VENOUS MAL SGL STG 40 (MISCELLANEOUS)
CARDIOBLATE CARDIAC ABLATION (MISCELLANEOUS) ×5
CATH CPB KIT VANTRIGT (MISCELLANEOUS) ×5 IMPLANT
CATH ROBINSON RED A/P 18FR (CATHETERS) ×17 IMPLANT
CATH THORACIC 28FR (CATHETERS) IMPLANT
CATH THORACIC 28FR RT ANG (CATHETERS) IMPLANT
CATH THORACIC 36FR (CATHETERS) IMPLANT
CATH THORACIC 36FR RT ANG (CATHETERS) ×10 IMPLANT
CLIP FOGARTY SPRING 6M (CLIP) ×2 IMPLANT
CLIP TI WIDE RED SMALL 24 (CLIP) IMPLANT
CLOTH BEACON ORANGE TIMEOUT ST (SAFETY) ×3 IMPLANT
CONN 1/2X1/2X1/2  BEN (MISCELLANEOUS) ×2
CONN 1/2X1/2X1/2 BEN (MISCELLANEOUS) IMPLANT
CONN 3/8X1/2 ST GISH (MISCELLANEOUS) ×4 IMPLANT
COVER SURGICAL LIGHT HANDLE (MISCELLANEOUS) ×5 IMPLANT
CRADLE DONUT ADULT HEAD (MISCELLANEOUS) ×5 IMPLANT
DEVICE CARDIOBLATE CARDIAC ABL (MISCELLANEOUS) IMPLANT
DRAIN CHANNEL 32F RND 10.7 FF (WOUND CARE) ×5 IMPLANT
DRAPE CARDIOVASCULAR INCISE (DRAPES) ×5
DRAPE SLUSH MACHINE 52X66 (DRAPES) IMPLANT
DRAPE SLUSH/WARMER DISC (DRAPES) ×2 IMPLANT
DRAPE SRG 135X102X78XABS (DRAPES) ×3 IMPLANT
DRSG COVADERM 4X14 (GAUZE/BANDAGES/DRESSINGS) ×5 IMPLANT
ELECT BLADE 4.0 EZ CLEAN MEGAD (MISCELLANEOUS) ×5
ELECT BLADE 6.5 EXT (BLADE) ×5 IMPLANT
ELECT CAUTERY BLADE 6.4 (BLADE) ×5 IMPLANT
ELECT REM PT RETURN 9FT ADLT (ELECTROSURGICAL) ×10
ELECTRODE BLDE 4.0 EZ CLN MEGD (MISCELLANEOUS) ×3 IMPLANT
ELECTRODE REM PT RTRN 9FT ADLT (ELECTROSURGICAL) ×6 IMPLANT
GLOVE BIO SURGEON STRL SZ7.5 (GLOVE) ×12 IMPLANT
GOWN EXTRA PROTECTION XL (GOWNS) ×4 IMPLANT
GOWN STRL NON-REIN LRG LVL3 (GOWN DISPOSABLE) ×28 IMPLANT
HEMOSTAT POWDER SURGIFOAM 1G (HEMOSTASIS) ×15 IMPLANT
HEMOSTAT SURGICEL 2X14 (HEMOSTASIS) ×5 IMPLANT
INSERT FOGARTY XLG (MISCELLANEOUS) IMPLANT
KIT BASIN OR (CUSTOM PROCEDURE TRAY) ×5 IMPLANT
KIT ROOM TURNOVER OR (KITS) ×5 IMPLANT
KIT SUCTION CATH 14FR (SUCTIONS) ×5 IMPLANT
KIT VASOVIEW W/TROCAR VH 2000 (KITS) ×5 IMPLANT
LEAD PACING MYOCARDI (MISCELLANEOUS) ×5 IMPLANT
LINE VENT (MISCELLANEOUS) ×2 IMPLANT
LOOP VESSEL SUPERMAXI WHITE (MISCELLANEOUS) ×2 IMPLANT
MARKER GRAFT CORONARY BYPASS (MISCELLANEOUS) ×15 IMPLANT
MARKER SKIN DUAL TIP RULER LAB (MISCELLANEOUS) ×4 IMPLANT
NS IRRIG 1000ML POUR BTL (IV SOLUTION) ×25 IMPLANT
PACK OPEN HEART (CUSTOM PROCEDURE TRAY) ×5 IMPLANT
PAD ARMBOARD 7.5X6 YLW CONV (MISCELLANEOUS) ×10 IMPLANT
PENCIL BUTTON HOLSTER BLD 10FT (ELECTRODE) ×5 IMPLANT
PROBE CRYO2-ABLATION MALLABLE (MISCELLANEOUS) IMPLANT
PUNCH AORTIC ROTATE  4.5MM 8IN (MISCELLANEOUS) ×2 IMPLANT
PUNCH AORTIC ROTATE 4.0MM (MISCELLANEOUS) IMPLANT
PUNCH AORTIC ROTATE 4.5MM 8IN (MISCELLANEOUS) IMPLANT
PUNCH AORTIC ROTATE 5MM 8IN (MISCELLANEOUS) IMPLANT
SET CARDIOPLEGIA MPS 5001102 (MISCELLANEOUS) ×2 IMPLANT
SOLUTION ANTI FOG 6CC (MISCELLANEOUS) ×2 IMPLANT
SPONGE GAUZE 4X4 12PLY (GAUZE/BANDAGES/DRESSINGS) ×10 IMPLANT
SPONGE LAP 18X18 X RAY DECT (DISPOSABLE) ×4 IMPLANT
STOPCOCK 4 WAY LG BORE MALE ST (IV SETS) ×2 IMPLANT
SUCKER INTRACARDIAC WEIGHTED (SUCKER) ×4 IMPLANT
SURGIFLO W/THROMBIN 8M KIT (HEMOSTASIS) ×2 IMPLANT
SUT BONE WAX W31G (SUTURE) ×5 IMPLANT
SUT ETHIBOND 2 0 SH (SUTURE) ×5
SUT ETHIBOND 2 0 SH 36X2 (SUTURE) IMPLANT
SUT ETHIBOND X763 2 0 SH 1 (SUTURE) ×2 IMPLANT
SUT MNCRL AB 4-0 PS2 18 (SUTURE) ×2 IMPLANT
SUT PROLENE 3 0 SH DA (SUTURE) IMPLANT
SUT PROLENE 3 0 SH1 36 (SUTURE) ×6 IMPLANT
SUT PROLENE 4 0 RB 1 (SUTURE) ×40
SUT PROLENE 4 0 SH DA (SUTURE) ×5 IMPLANT
SUT PROLENE 4-0 RB1 .5 CRCL 36 (SUTURE) ×3 IMPLANT
SUT PROLENE 5 0 C 1 36 (SUTURE) IMPLANT
SUT PROLENE 6 0 C 1 30 (SUTURE) ×4 IMPLANT
SUT PROLENE 6 0 CC (SUTURE) ×4 IMPLANT
SUT PROLENE 7 0 DA (SUTURE) IMPLANT
SUT PROLENE 7.0 RB 3 (SUTURE) ×17 IMPLANT
SUT PROLENE 8 0 BV175 6 (SUTURE) ×2 IMPLANT
SUT PROLENE BLUE 7 0 (SUTURE) ×7 IMPLANT
SUT SILK  1 MH (SUTURE) ×2
SUT SILK 1 MH (SUTURE) IMPLANT
SUT SILK 2 0 SH CR/8 (SUTURE) IMPLANT
SUT SILK 3 0 SH CR/8 (SUTURE) IMPLANT
SUT STEEL 6MS V (SUTURE) ×10 IMPLANT
SUT STEEL STERNAL CCS#1 18IN (SUTURE) IMPLANT
SUT STEEL SZ 6 DBL 3X14 BALL (SUTURE) ×5 IMPLANT
SUT VIC AB 1 CTX 36 (SUTURE) ×10
SUT VIC AB 1 CTX36XBRD ANBCTR (SUTURE) ×6 IMPLANT
SUT VIC AB 2-0 CT1 27 (SUTURE) ×5
SUT VIC AB 2-0 CT1 TAPERPNT 27 (SUTURE) IMPLANT
SUT VIC AB 2-0 CTX 27 (SUTURE) IMPLANT
SUT VIC AB 3-0 SH 27 (SUTURE)
SUT VIC AB 3-0 SH 27X BRD (SUTURE) IMPLANT
SUT VIC AB 3-0 X1 27 (SUTURE) IMPLANT
SUT VICRYL 4-0 PS2 18IN ABS (SUTURE) IMPLANT
SUTURE E-PAK OPEN HEART (SUTURE) ×5 IMPLANT
SYS ATRICLIP LAA EXCLUSION 35 (Clip) ×2 IMPLANT
SYS ATRICLIP LAA EXCLUSION 45 (CLIP) IMPLANT
SYSTEM SAHARA CHEST DRAIN ATS (WOUND CARE) ×5 IMPLANT
TOWEL OR 17X24 6PK STRL BLUE (TOWEL DISPOSABLE) ×10 IMPLANT
TOWEL OR 17X26 10 PK STRL BLUE (TOWEL DISPOSABLE) ×10 IMPLANT
TRAY CATH LUMEN 1 20CM STRL (SET/KITS/TRAYS/PACK) ×2 IMPLANT
TRAY FOLEY IC TEMP SENS 14FR (CATHETERS) ×5 IMPLANT
TUBE SUCT INTRACARD DLP 20F (MISCELLANEOUS) ×2 IMPLANT
TUBING ART PRESS 48 MALE/FEM (TUBING) ×4 IMPLANT
TUBING INSUFFLATION 10FT LAP (TUBING) ×5 IMPLANT
UNDERPAD 30X30 INCONTINENT (UNDERPADS AND DIAPERS) ×5 IMPLANT
VRC MALLEABLE SINGLE STG 30FR (MISCELLANEOUS) ×5
WATER STERILE IRR 1000ML POUR (IV SOLUTION) ×10 IMPLANT

## 2012-05-18 NOTE — Transfer of Care (Signed)
Immediate Anesthesia Transfer of Care Note  Patient: [name]  Procedure(s) Performed: Procedure(s) with comments: CORONARY ARTERY BYPASS GRAFTING (CABG) (N/A) - Times 4 using left internal mammary artery and endoscopically harvested right saphenous vein INTRAOPERATIVE TRANSESOPHAGEAL ECHOCARDIOGRAM (N/A) MAZE (N/A) CLIPPING OF ATRIAL APPENDAGE (Left)  Patient Location: SICU  Anesthesia Type:General  Level of Consciousness: sedated and Patient remains intubated per anesthesia plan  Airway & Oxygen Therapy: Patient remains intubated per anesthesia plan and Patient placed on Ventilator (see vital sign flow sheet for setting)  Post-op Assessment: Report given to PACU RN, Post -op Vital signs reviewed and stable, Patient moving all extremities and Patient moving all extremities X 4  Post vital signs: Reviewed and stable  Complications: No apparent anesthesia complications

## 2012-05-18 NOTE — Progress Notes (Signed)
Reported off to oncoming RN. No acute distress noted. Safety maintained.

## 2012-05-18 NOTE — Telephone Encounter (Signed)
FYI

## 2012-05-18 NOTE — Anesthesia Preprocedure Evaluation (Addendum)
Anesthesia Evaluation  Patient identified by MRN, date of birth, ID band Patient awake    Reviewed: Allergy & Precautions, H&P , NPO status , Patient's Chart, lab work & pertinent test results, reviewed documented beta blocker date and time   Airway Mallampati: II TM Distance: >3 FB Neck ROM: full    Dental   Pulmonary neg pulmonary ROS,  breath sounds clear to auscultation        Cardiovascular hypertension, On Medications + CAD and + Cardiac Stents + dysrhythmias Atrial Fibrillation Rhythm:regular     Neuro/Psych negative neurological ROS  negative psych ROS   GI/Hepatic negative GI ROS, Neg liver ROS,   Endo/Other  Hyperthyroidism   Renal/GU negative Renal ROS  negative genitourinary   Musculoskeletal   Abdominal   Peds  Hematology negative hematology ROS (+)   Anesthesia Other Findings See surgeon's H&P   Reproductive/Obstetrics negative OB ROS                           Anesthesia Physical Anesthesia Plan  ASA: III  Anesthesia Plan: General   Post-op Pain Management:    Induction: Intravenous  Airway Management Planned: Oral ETT  Additional Equipment: Arterial line, CVP, PA Cath, TEE and Ultrasound Guidance Line Placement  Intra-op Plan:   Post-operative Plan: Post-operative intubation/ventilation  Informed Consent: I have reviewed the patients History and Physical, chart, labs and discussed the procedure including the risks, benefits and alternatives for the proposed anesthesia with the patient or authorized representative who has indicated his/her understanding and acceptance.   Dental Advisory Given  Plan Discussed with: CRNA and Surgeon  Anesthesia Plan Comments:        Anesthesia Quick Evaluation

## 2012-05-18 NOTE — Anesthesia Postprocedure Evaluation (Signed)
  Anesthesia Post-op Note  Patient: [name]  Procedure(s) Performed: Procedure(s) with comments: CORONARY ARTERY BYPASS GRAFTING (CABG) (N/A) - Times 4 using left internal mammary artery and endoscopically harvested right saphenous vein INTRAOPERATIVE TRANSESOPHAGEAL ECHOCARDIOGRAM (N/A) MAZE (N/A) CLIPPING OF ATRIAL APPENDAGE (Left)  Patient Location: SICU  Anesthesia Type:General  Level of Consciousness: sedated  Airway and Oxygen Therapy: Patient remains intubated per anesthesia plan and Patient placed on Ventilator (see vital sign flow sheet for setting)  Post-op Pain: none  Post-op Assessment: Post-op Vital signs reviewed, Patient's Cardiovascular Status Stable, Respiratory Function Stable, Patent Airway, No signs of Nausea or vomiting and Pain level controlled  Post-op Vital Signs: Reviewed and stable  Complications: No apparent anesthesia complications

## 2012-05-18 NOTE — Progress Notes (Signed)
  Echocardiogram Echocardiogram Transesophageal has been performed.  ALTAN, KRAAI 05/18/2012, 8:33 AM

## 2012-05-18 NOTE — Brief Op Note (Signed)
05/16/2012 - 05/18/2012  12:02 PM  PATIENT:  [name]  79 y.o. male  PRE-OPERATIVE DIAGNOSIS:  Coronary Artery Disease  POST-OPERATIVE DIAGNOSIS:  Coronary Artery Disease  PROCEDURE:  Procedure(s) with comments:  CORONARY ARTERY BYPASS GRAFTING  X 4 -LIMA to LAD -SVG to OM1 -SVG to OM2 -SVG to PDA  COMPLETE MAZE with Bi Atrial Lesion set utilizing radiofrequency ablatation  CLIPPING OF ATRIAL APPENDAGE (Left)  ENDOSCOPIC SAPHENOUS VEIN HARVEST RIGHT LEG  SURGEON:  Surgeon(s) and Role:    * Kerin Perna, MD - Primary  PHYSICIAN ASSISTANT: Lowella Dandy PA-C  ANESTHESIA:   general  EBL:  Total I/O In: 2400 [I.V.:2400] Out: 1200 [Urine:1200]  BLOOD ADMINISTERED  PRBC, PLATELETS and  CELLSAVER  DRAINS: Left Pleural chest tube, Mediastinal chest drain   LOCAL MEDICATIONS USED:  NONE  SPECIMEN:  No Specimen  DISPOSITION OF SPECIMEN:  N/A  COUNTS:  YES  TOURNIQUET:  * No tourniquets in log *  DICTATION: .Dragon Dictation  PLAN OF CARE: Admit to inpatient   PATIENT DISPOSITION:  ICU - intubated and hemodynamically stable.   Delay start of Pharmacological VTE agent (>24hrs) due to surgical blood loss or risk of bleeding: yes

## 2012-05-18 NOTE — Anesthesia Procedure Notes (Addendum)
Procedure Name: Intubation Date/Time: 05/18/2012 7:43 AM Performed by: Wray Kearns A Pre-anesthesia Checklist: Patient identified, Timeout performed, Emergency Drugs available, Suction available and Patient being monitored Patient Re-evaluated:Patient Re-evaluated prior to inductionOxygen Delivery Method: Circle system utilized Preoxygenation: Pre-oxygenation with 100% oxygen Intubation Type: IV induction and Cricoid Pressure applied Ventilation: Mask ventilation without difficulty Laryngoscope Size: Mac and 4 Grade View: Grade I Tube type: Oral Tube size: 8.0 mm Number of attempts: 1 Airway Equipment and Method: Stylet Placement Confirmation: ETT inserted through vocal cords under direct vision,  breath sounds checked- equal and bilateral and positive ETCO2 Secured at: 24 cm Tube secured with: Tape Dental Injury: Teeth and Oropharynx as per pre-operative assessment     Procedures: Right IJ Theone Murdoch Catheter Insertion: [phone] The patient was identified and consent obtained.  TO was performed, and full barrier precautions were used.  The skin was anesthetized with lidocaine-4cc plain with 25g needle.  Once the vein was located with the 22 ga. needle using ultrasound guidance , the wire was inserted into the vein.  The wire location was confirmed with ultrasound.  The tissue was dilated and the 8.5 [place] cordis catheter was carefully inserted. Afterwards Theone Murdoch catheter was inserted. PA catheter at 48cm.  The patient tolerated the procedure well.   CE

## 2012-05-18 NOTE — OR Nursing (Signed)
First call to SICU @ 1400

## 2012-05-18 NOTE — Progress Notes (Signed)
Pt taken to OR, report given to CRNA on case. Pt accompanied by wife and daughter. Pt's belongings taken to SICU. Will cont to monitor.

## 2012-05-18 NOTE — Telephone Encounter (Signed)
Pt wife Eunice Blase) called and states that pt is having open heart surgery today, states pt went to [facility] and had a cath and had multiple blockages, states he just got released from [facility], where they dx him with GI issues.

## 2012-05-18 NOTE — Progress Notes (Signed)
TCTS  The patient was examined and preop studies reviewed. There has been no change from the prior exam and the patient is ready for surgery.  [plan CABG,MAZE onW Willemsen today

## 2012-05-18 NOTE — Preoperative (Signed)
Beta Blockers   Reason not to administer Beta Blockers:Metoprolol held this A.M. due to Bradycardia

## 2012-05-19 ENCOUNTER — Inpatient Hospital Stay (HOSPITAL_COMMUNITY): Payer: Medicare Other

## 2012-05-19 LAB — MAGNESIUM
Magnesium: 2.6 mg/dL — ABNORMAL HIGH (ref 1.5–2.5)
Magnesium: 2.4 mg/dL (ref 1.5–2.5)

## 2012-05-19 LAB — GLUCOSE, CAPILLARY
Glucose-Capillary: 108 mg/dL — ABNORMAL HIGH (ref 70–99)
Glucose-Capillary: 114 mg/dL — ABNORMAL HIGH (ref 70–99)
Glucose-Capillary: 117 mg/dL — ABNORMAL HIGH (ref 70–99)
Glucose-Capillary: 121 mg/dL — ABNORMAL HIGH (ref 70–99)
Glucose-Capillary: 122 mg/dL — ABNORMAL HIGH (ref 70–99)
Glucose-Capillary: 131 mg/dL — ABNORMAL HIGH (ref 70–99)
Glucose-Capillary: 142 mg/dL — ABNORMAL HIGH (ref 70–99)
Glucose-Capillary: 143 mg/dL — ABNORMAL HIGH (ref 70–99)
Glucose-Capillary: 144 mg/dL — ABNORMAL HIGH (ref 70–99)
Glucose-Capillary: 148 mg/dL — ABNORMAL HIGH (ref 70–99)
Glucose-Capillary: 90 mg/dL (ref 70–99)

## 2012-05-19 LAB — CARBOXYHEMOGLOBIN
Carboxyhemoglobin: 1.4 % (ref 0.5–1.5)
Methemoglobin: 1.3 % (ref 0.0–1.5)
O2 Saturation: 60.6 %
Total hemoglobin: 8.7 g/dL — ABNORMAL LOW (ref 13.5–18.0)

## 2012-05-19 LAB — BASIC METABOLIC PANEL
CO2: 24 mEq/L (ref 19–32)
Calcium: 7.9 mg/dL — ABNORMAL LOW (ref 8.4–10.5)
Creatinine, Ser: 0.97 mg/dL (ref 0.50–1.35)
GFR calc Af Amer: 90 mL/min — ABNORMAL LOW (ref >90)
GFR calc non Af Amer: 78 mL/min — ABNORMAL LOW (ref >90)
Sodium: 137 mEq/L (ref 135–145)

## 2012-05-19 LAB — PREPARE PLATELET PHERESIS: Unit division: 0

## 2012-05-19 LAB — HEPATIC FUNCTION PANEL
ALT: 16 U/L (ref 0–53)
AST: 48 U/L — ABNORMAL HIGH (ref 0–37)
Albumin: 3 g/dL — ABNORMAL LOW (ref 3.5–5.2)
Alkaline Phosphatase: 56 U/L (ref 39–117)
Bilirubin, Direct: 0.2 mg/dL (ref 0.0–0.3)
Indirect Bilirubin: 0.5 mg/dL (ref 0.3–0.9)
Total Bilirubin: 0.7 mg/dL (ref 0.3–1.2)
Total Protein: 5.3 g/dL — ABNORMAL LOW (ref 6.0–8.3)

## 2012-05-19 LAB — POCT I-STAT 3, ART BLOOD GAS (G3+)
Acid-base deficit: 1 mmol/L (ref 0.0–2.0)
O2 Saturation: 96 %
Patient temperature: 37.2
pCO2 arterial: 36.5 mmHg (ref 35.0–45.0)
pCO2 arterial: 37.8 mmHg (ref 35.0–45.0)
pH, Arterial: 7.372 (ref 7.350–7.450)
pO2, Arterial: 78 mmHg — ABNORMAL LOW (ref 80.0–100.0)

## 2012-05-19 LAB — CBC
HCT: 23.6 % — ABNORMAL LOW (ref 39.0–52.0)
HCT: 23.3 % — ABNORMAL LOW (ref 39.0–52.0)
Hemoglobin: 8.5 g/dL — ABNORMAL LOW (ref 13.0–17.0)
Hemoglobin: 8.4 g/dL — ABNORMAL LOW (ref 13.0–17.0)
MCH: 29.4 pg (ref 26.0–34.0)
MCH: 29.2 pg (ref 26.0–34.0)
MCHC: 36 g/dL (ref 30.0–36.0)
MCHC: 36.1 g/dL — ABNORMAL HIGH (ref 30.0–36.0)
MCV: 81.7 fL (ref 78.0–100.0)
MCV: 81.1 fL (ref 78.0–100.0)
Platelets: 121 10*3/uL — ABNORMAL LOW (ref 150–400)
Platelets: 121 10*3/uL — ABNORMAL LOW (ref 150–400)
RBC: 3.33 MIL/uL — ABNORMAL LOW (ref 4.22–5.81)
RBC: 2.91 MIL/uL — ABNORMAL LOW (ref 4.22–5.81)
RDW: 13.3 % (ref 11.5–15.5)
RDW: 13.4 % (ref 11.5–15.5)
WBC: 12.2 10*3/uL — ABNORMAL HIGH (ref 4.0–10.5)
WBC: 15.5 10*3/uL — ABNORMAL HIGH (ref 4.0–10.5)
WBC: 15 10*3/uL — ABNORMAL HIGH (ref 4.0–10.5)

## 2012-05-19 LAB — PREPARE FRESH FROZEN PLASMA: Unit division: 0

## 2012-05-19 LAB — CREATININE, SERUM
Creatinine, Ser: 1.18 mg/dL (ref 0.50–1.35)
GFR calc Af Amer: 67 mL/min — ABNORMAL LOW (ref >90)
GFR calc non Af Amer: 58 mL/min — ABNORMAL LOW (ref >90)

## 2012-05-19 LAB — POCT I-STAT, CHEM 8
BUN: 16 mg/dL (ref 6–23)
Calcium, Ion: 1.2 mmol/L (ref 1.13–1.30)
Chloride: 101 mEq/L (ref 96–112)

## 2012-05-19 LAB — PREPARE RBC (CROSSMATCH)

## 2012-05-19 MED ORDER — AMIODARONE HCL 200 MG PO TABS
200.0000 mg | ORAL_TABLET | Freq: Two times a day (BID) | ORAL | Status: DC
  Administered 2012-05-19 – 2012-05-23 (×9): 200 mg via ORAL
  Filled 2012-05-19 (×10): qty 1

## 2012-05-19 MED ORDER — FUROSEMIDE 10 MG/ML IJ SOLN
20.0000 mg | Freq: Every day | INTRAMUSCULAR | Status: DC
  Administered 2012-05-19: 20 mg via INTRAVENOUS
  Filled 2012-05-19 (×2): qty 2

## 2012-05-19 MED ORDER — INSULIN ASPART 100 UNIT/ML ~~LOC~~ SOLN
0.0000 [IU] | SUBCUTANEOUS | Status: DC
  Administered 2012-05-19 – 2012-05-20 (×6): 2 [IU] via SUBCUTANEOUS

## 2012-05-19 MED ORDER — METOPROLOL TARTRATE 12.5 MG HALF TABLET
12.5000 mg | ORAL_TABLET | Freq: Two times a day (BID) | ORAL | Status: DC
  Administered 2012-05-20 – 2012-05-23 (×7): 12.5 mg via ORAL
  Filled 2012-05-19 (×10): qty 1

## 2012-05-19 MED ORDER — TRAMADOL HCL 50 MG PO TABS: 50.0000 mg | ORAL_TABLET | Freq: Four times a day (QID) | ORAL | Status: DC | PRN

## 2012-05-19 NOTE — Progress Notes (Signed)
TCTS  Patient examined and record reviewed.Hemodynamics stable,labs satisfactory.Patient had stable day.Continue current care. VAN TRIGT III,PETER 05/19/2012    

## 2012-05-19 NOTE — Progress Notes (Signed)
1 Day Post-Op Procedure(s) (LRB): CORONARY ARTERY BYPASS GRAFTING (CABG) (N/A) INTRAOPERATIVE TRANSESOPHAGEAL ECHOCARDIOGRAM (N/A) MAZE (N/A) CLIPPING OF ATRIAL APPENDAGE (Left) Subjective: CABGx4, Bi-atrial Maze with LA clip Posto[ coagulopathy,anemia due to preop plavix Extubated neuro intact Chest tubes still draining- leave in  Objective: Vital signs in last 24 hours: Temp:  [95.9 F (35.5 C)-99 F (37.2 C)] 98.6 F (37 C) (05/10 0900) Pulse Rate:  [70-90] 81 (05/10 0900) Cardiac Rhythm:  [-] Atrial paced (05/10 0800) Resp:  [12-22] 15 (05/10 0900) BP: (93-124)/(54-76) 103/54 mmHg (05/10 0900) SpO2:  [93 %-100 %] 100 % (05/10 0900) Arterial Line BP: (99-152)/(55-99) 117/58 mmHg (05/10 0800) FiO2 (%):  [39.9 %-50.4 %] 39.9 % (05/10 0400) Weight:  [187 lb 13.3 oz (85.2 kg)] 187 lb 13.3 oz (85.2 kg) (05/10 0500)  Hemodynamic parameters for last 24 hours: PAP: (20-39)/(6-22) 23/8 mmHg CO:  [3.2 L/min-7 L/min] 4.2 L/min CI:  [1.6 L/min/m2-3.5 L/min/m2] 2.1 L/min/m2  Intake/Output from previous day: 05/09 0701 - 05/10 0700 In: 11235.6 [I.V.:7376.6; Blood:2109; IV Piggyback:1750] Out: 8805 [Urine:5160; Blood:2310; Chest Tube:1335] Intake/Output this shift: Total I/O In: 88.7 [I.V.:88.7] Out: 280 [Urine:160; Chest Tube:120]  EXAM Good distal pulses Nsr, slow---A-paced  Lab Results:  Recent Labs  05/18/12 2145 05/18/12 2219 05/19/12 0420  WBC 12.8*  --  12.2*  HGB 9.0* 7.8* 9.8*  HCT 25.2* 23.0* 27.2*  PLT 106*  --  121*   BMET:  Recent Labs  05/18/12 0520  05/18/12 2219 05/19/12 0420  NA 134*  < > 137 137  K 4.0  < > 4.2 4.4  CL 99  --  103 105  CO2 27  --   --  24  GLUCOSE 115*  < > 129* 128*  BUN 16  --  12 13  CREATININE 1.05  < > 0.90 0.97  CALCIUM 9.4  --   --  7.9*  < > = values in this interval not displayed.  PT/INR:  Recent Labs  05/18/12 1530  LABPROT 18.0*  INR 1.54*   ABG    Component Value Date/Time   PHART 7.372 05/19/2012 0533    HCO3 21.9 05/19/2012 0533   TCO2 23 05/19/2012 0533   ACIDBASEDEF 3.0* 05/19/2012 0533   O2SAT 95.0 05/19/2012 0533   CBG (last 3)   Recent Labs  05/19/12 0421 05/19/12 0532 05/19/12 0718  GLUCAP 136* 131* 108*    Assessment/Plan: S/P Procedure(s) (LRB): CORONARY ARTERY BYPASS GRAFTING (CABG) (N/A) INTRAOPERATIVE TRANSESOPHAGEAL ECHOCARDIOGRAM (N/A) MAZE (N/A) CLIPPING OF ATRIAL APPENDAGE (Left) See progression orders Check Hb at noon  LOS: 3 days    [name],[name] 05/19/2012

## 2012-05-19 NOTE — Op Note (Signed)
[name], ESCO NO.:  [id number]  MEDICAL RECORD NO.:  [id number]  LOCATION:  2301                         FACILITY:  [facility]  PHYSICIAN:  Kerin Perna, M.D.  DATE OF BIRTH:  1932-08-12  DATE OF PROCEDURE:  05/18/2012 DATE OF DISCHARGE:                              OPERATIVE REPORT   OPERATION: 1. Coronary artery bypass grafting x4 (left internal mammary artery to     left anterior descending, saphenous vein graft to obtuse marginal     1, saphenous vein graft to obtuse marginal 2, saphenous vein graft     to posterior descending). 2. Biatrial full Maze procedure using radiofrequency ablation. 3. Endoscopic harvest of right leg greater saphenous vein. 4. Ligation of Left Atrial appendage SURGEON:  Kerin Perna, M.D. 5. Placement of femoral A-line ASSISTANT:  Erin Barrett, PA-C  PREOPERATIVE DIAGNOSES:  Unstable angina, severe three-vessel coronary artery disease, history of paroxysmal atrial fibrillation.  POSTOPERATIVE DIAGNOSES:  Unstable angina, severe three-vessel coronary artery disease, history of paroxysmal atrial fibrillation.  ANESTHESIA:  General by Dr. Hart Robinsons.  INDICATIONS:  The patient is a 79 year old gentleman with prior history of percutaneous intervention of his LAD and right coronary who presents with unstable angina and in-stent stenosis and severe three-vessel disease with fairly well-preserved LV function.  He has a 2-3 history of paroxysmal atrial fibrillation, for which he has been on intermittently Pradaxa, Coumadin, and amiodarone.  He was felt to be candidate for surgical revascularization.  The patient also desired to have a procedure for atrial fibrillation as he did not wish to remain dependent on anticoagulation for his paroxysmal atrial fibrillation.  Prior to surgery, I reviewed the cardiac cath and echo with the patient and his family.  I discussed the indications and expected benefits of coronary  artery bypass grafting and Maze procedure for treatment of his severe coronary artery disease and atrial fibrillation.  I reviewed the major aspects of the operation including the use of general anesthesia and cardiopulmonary bypass, the expected postoperative hospital recovery, the potential risks of bleeding, blood transfusion, stroke, MI, death, and pacemaker dependence.  After reviewing these issues, he demonstrated his understanding and agreed to proceed with surgery under what I felt was an informed consent.  The patient also had been taking Plavix for his previous stent placements and we checked a platelet function assay-P2Y12 which showed that his platelet activity was adequate for surgery.  OPERATIVE FINDINGS: 1. Small LAD target, small left IMA, but with good flow. 2. Diffuse coagulopathy requiring platelet transfusion at termination     of bypass. 3. Successful biatrial radiofrequency ablation - Maze procedure for     atrial fibrillation. 4. Atrial clipping for atrial fibrillation.  OPERATIVE NOTE:  The patient was brought to the operative room, placed supine on the operating table where general anesthesia was induced under invasive hemodynamic monitoring.  A transesophageal 2D echo probe was placed by the anesthesiologist.  He had no significant valvular insufficiency and mild LV dysfunction.  The patient was prepped and draped as a sterile field.  A formal time- out was performed.  A sternal incision was made as the saphenous vein was harvested endoscopically  from the right leg.  The left internal mammary artery was harvested as a pedicle graft from its origin at the subclavian vessels.  It was a 1.4-mm vessel with good flow.  The sternal retractor was placed and the pericardium was suspended.  Pursestrings were placed in the ascending aorta and the right atrium and the patient was cannulated after heparin was administered and ACT was documented as being therapeutic.   The patient was placed on cardiopulmonary bypass and a second pursestring was placed in the low right atrium for bicaval drainage.  Caval tapes were placed around the superior and inferior caval cannula.  The coronaries were identified for grafting and the mammary artery and vein grafts were prepared for the distal anastomoses. Cardioplegic cannula was replaced for both antegrade and retrograde cold blood cardioplegia.  The patient was cooled to 32 degrees.  The interatrial groove was dissected.  The aortic crossclamp was applied.  1 L of cold blood cardioplegia was delivered in split doses between the antegrade aortic and retrograde coronary sinus catheters.  There was good cardioplegic arrest and septal temperature dropped to less than 14 degrees.  Cardioplegia was delivered every 20 minutes.  First the ablation lines were placed around the left-sided pulmonary veins using the bipolar radiofrequency clamp.  The ligament of Gaynell Face was taken down and the left-sided superior and inferior veins were encircled with a vessel loop and the bipolar clamp was applied and 2 ablation lines were created.  Next, the AtriCure atrial clip was placed around the left atrial appendage, which measured at a 35 mm appendage.  The clip fit snugly at the base without bleeding or trauma.  Cardioplegia was then redosed.  We then directed our attention to the distal coronary anastomoses.  First distal anastomosis was the posterior descending branch of the right coronary.  This had a proximal 90% stenosis.  It was 1.5-mm vessel.  A reverse saphenous vein was sewn end-to-side with running 7-0 Prolene with good flow through the graft.  The second distal anastomosis was the OM 2 branch of the left coronary.  This was a 1.5-mm vessel with proximal 80% stenosis and a reverse saphenous vein was sewn end-to-side with running 7-0 Prolene with good flow through the graft.  Cardioplegia was redosed.  The third  distal anastomosis was the OM 1 branch of the left circumflex. This had a proximal 80% stenosis.  A reverse saphenous vein was sewn end- to-side with running 7-0 Prolene with good flow through the graft. Cardioplegia was redosed.  The fourth distal anastomosis was to the distal third of the LAD.  It was a 1.5-mm vessel and the left IMA pedicle was brought through an opening and the left lateral pericardium was brought down onto the LAD and sewn end-to-side with running 8-0 Prolene.  There was good flow through the anastomosis after briefly releasing the pedicle bulldog on the mammary artery.  The bulldog was reapplied and the pedicle was secured to the epicardium.  Cardioplegia was redosed.  We then directed attention to the right-sided pulmonary vein Maze procedure.  The vessel loops were snugged around the cannula and a left atriotomy was performed.  The bipolar clamp was then used to complete the ablation lines around the cuff of left superior and inferior vein with the incision being the anterior aspect of the incision and the bipolar radiofrequency clamp positioned posteriorly.  There was a small gap between the superior and inferior bipolar ablation lines and this was filled with the unipolar ablation  device.  Next, 2 ablation lines were placed using the bipolar clamp across the floor of the left atrium from the inferior aspect of the incision to the encircling ablation line around the left-sided pulmonary veins.  Next, a 3rd ablation line was placed across the posterior aspect of the left atrium to the P3 area of the mitral annulus.  This completed the Maze procedure for the left atrium and the atriotomy was closed with a running 3-0 Prolene in 2 layers.  Next, a vertical incision was made in the right atrium and the bipolar radiofrequency device was placed to make 2 ablation lines running superior up towards the SVC and inferiorly from the lower part of the incision to the  __________ to the IVC.  The right atriotomy was then closed with a running 4-0 Prolene in 2 layers.  Cardioplegia was redosed.  While the crossclamp was still in place, 3 proximal vein anastomoses were performed on the ascending aorta using a 4.5 mm punch and running 7- 0 Prolene.  Prior to tying down the final proximal anastomosis, air was vented from the coronaries with a dose of retrograde warm blood cardioplegia and the crossclamp was removed.  The heart resumed a spontaneous rhythm.  It should be noted that CO2 was used to insufflate the operative field during the period of the Maze procedure when the heart was opened as well as for the proximal anastomoses.  Reperfusion rewarming.  The coronary anastomoses were checked and found to be hemostatic.  The atrial incisions were hemostatic.  Temporary pacing wires were applied.  The lungs were expanded, the ventilator was resumed.  The patient was placed on low-dose dopamine.  There was good cardiac function and we weaned off cardiopulmonary bypass.  The patient was stable for 1-2 minutes, but then developed some RV dilatation and low blood pressure, and so we immediately transitioned back to cardiopulmonary bypass for presumed air in the coronaries.  We reperfused the heart with a normal blood pressure on pump for several minutes and placed a vent in the ascending aorta to scavenge any remaining left-sided air.  After remaining on bypass for several minutes with good mean pressure, we weaned off cardiopulmonary bypass.  I placed a femoral A-line for blood pressure monitoring prior to coming off bypass.  This attempted bypass was successful, the global function was normal, and there was no air noted on transesophageal echo.  Protamine was administered and the cannulas were removed.  Protamine was administered without adverse reaction.  The mediastinum was irrigated with warm saline.  The superior pericardial fat was closed over  the aorta.  There was some mild diffuse oozing and the patient was given platelets due to preoperative Plavix use.  An anterior mediastinal and left pleural chest tube were placed and brought out through separate incisions.  The sternum was closed with interrupted steel wire.  The pectoralis fascia was closed in running Vicryl.  The subcutaneous and skin layers were closed in running Vicryl and sterile dressings were applied.  Total cardiopulmonary bypass time was 240 minutes.     Kerin Perna, M.D.    PV/MEDQ  D:  05/18/2012  T:  05/19/2012  Job:  [id number]

## 2012-05-19 NOTE — Procedures (Signed)
Extubation Procedure Note  Patient Details:   Name: [name] DOB: [birth year] MRN: [id number]   Airway Documentation:   Extubated to 4 lpm nasal cannula.  VC 650 ml, NIF -30, patient able to hold head off bed 10 seconds.  Able to breathe around deflated cuff and vocalize post procedure.  Tolerated well, no complications noted.  IS done with patient, 1000-1585ml X 10 efforts.  Evaluation  O2 sats: stable throughout Complications: No apparent complications Patient did tolerate procedure well. Bilateral Breath Sounds: Clear   Yes  Jayle Solarz, Aloha Gell 05/19/2012, 4:07 AM

## 2012-05-20 ENCOUNTER — Inpatient Hospital Stay (HOSPITAL_COMMUNITY): Payer: Medicare Other

## 2012-05-20 LAB — CBC
HCT: 22.1 % — ABNORMAL LOW (ref 39.0–52.0)
HCT: 26.1 % — ABNORMAL LOW (ref 39.0–52.0)
Hemoglobin: 7.9 g/dL — ABNORMAL LOW (ref 13.0–17.0)
Hemoglobin: 9.3 g/dL — ABNORMAL LOW (ref 13.0–17.0)
MCH: 29.6 pg (ref 26.0–34.0)
MCH: 29.2 pg (ref 26.0–34.0)
MCHC: 35.7 g/dL (ref 30.0–36.0)
MCHC: 35.6 g/dL (ref 30.0–36.0)
MCV: 82.8 fL (ref 78.0–100.0)
MCV: 82.1 fL (ref 78.0–100.0)
Platelets: 98 10*3/uL — ABNORMAL LOW (ref 150–400)
Platelets: 107 10*3/uL — ABNORMAL LOW (ref 150–400)
RBC: 2.67 MIL/uL — ABNORMAL LOW (ref 4.22–5.81)
RBC: 3.18 MIL/uL — ABNORMAL LOW (ref 4.22–5.81)
RDW: 13.8 % (ref 11.5–15.5)
RDW: 13.7 % (ref 11.5–15.5)
WBC: 11.8 10*3/uL — ABNORMAL HIGH (ref 4.0–10.5)
WBC: 10 10*3/uL (ref 4.0–10.5)

## 2012-05-20 LAB — TYPE AND SCREEN
ABO/RH(D): B POS
Antibody Screen: NEGATIVE

## 2012-05-20 LAB — GLUCOSE, CAPILLARY: Glucose-Capillary: 115 mg/dL — ABNORMAL HIGH (ref 70–99)

## 2012-05-20 LAB — PROTIME-INR
INR: 1.22 (ref 0.00–1.49)
Prothrombin Time: 15.2 seconds (ref 11.6–15.2)

## 2012-05-20 LAB — COMPREHENSIVE METABOLIC PANEL
ALT: 18 U/L (ref 0–53)
AST: 35 U/L (ref 0–37)
Albumin: 3.1 g/dL — ABNORMAL LOW (ref 3.5–5.2)
Alkaline Phosphatase: 55 U/L (ref 39–117)
BUN: 20 mg/dL (ref 6–23)
CO2: 26 mEq/L (ref 19–32)
Calcium: 8.1 mg/dL — ABNORMAL LOW (ref 8.4–10.5)
Chloride: 101 mEq/L (ref 96–112)
Creatinine, Ser: 1.15 mg/dL (ref 0.50–1.35)
GFR calc Af Amer: 69 mL/min — ABNORMAL LOW (ref >90)
GFR calc non Af Amer: 60 mL/min — ABNORMAL LOW (ref >90)
Glucose, Bld: 149 mg/dL — ABNORMAL HIGH (ref 70–99)
Potassium: 3.8 mEq/L (ref 3.5–5.1)
Sodium: 135 mEq/L (ref 135–145)
Total Bilirubin: 1 mg/dL (ref 0.3–1.2)
Total Protein: 5.2 g/dL — ABNORMAL LOW (ref 6.0–8.3)

## 2012-05-20 MED ORDER — METOPROLOL TARTRATE 1 MG/ML IV SOLN: 5.0000 mg | Freq: Once | INTRAVENOUS | Status: DC

## 2012-05-20 MED ORDER — POTASSIUM CHLORIDE 10 MEQ/50ML IV SOLN
10.0000 meq | INTRAVENOUS | Status: AC
  Administered 2012-05-20 (×2): 10 meq via INTRAVENOUS
  Filled 2012-05-20: qty 100

## 2012-05-20 MED ORDER — FUROSEMIDE 10 MG/ML IJ SOLN
40.0000 mg | Freq: Once | INTRAMUSCULAR | Status: AC
  Administered 2012-05-20: 40 mg via INTRAVENOUS

## 2012-05-20 MED ORDER — FE FUMARATE-B12-VIT C-FA-IFC PO CAPS
1.0000 | ORAL_CAPSULE | Freq: Three times a day (TID) | ORAL | Status: DC
  Administered 2012-05-20 – 2012-05-23 (×9): 1 via ORAL
  Filled 2012-05-20 (×12): qty 1

## 2012-05-20 MED ORDER — MORPHINE SULFATE 2 MG/ML IJ SOLN: 2.0000 mg | INTRAMUSCULAR | Status: DC | PRN

## 2012-05-20 NOTE — Progress Notes (Signed)
2 Days Post-Op Procedure(s) (LRB): CORONARY ARTERY BYPASS GRAFTING (CABG) (N/A) INTRAOPERATIVE TRANSESOPHAGEAL ECHOCARDIOGRAM (N/A) MAZE (N/A) CLIPPING OF ATRIAL APPENDAGE (Left) Subjective: Postop day 2 CABG x4 with bi-atrial maze procedure Postop coagulopathy and anemia secondary to preop Plavix Episode of confusion last night associated with nightmares mental status intact this a.m. Maintaining sinus rhythm in the 70s off pacemaker Hemoglobin 7.8 this a.m. we'll transfuse one unit packed cells Ambulating in the hallway but bent over and week we'll ask for physical therapy Keep in intensive care today Chest tube drainage 500 cc over 24 hours yesterday, we tubes in today and observed  Objective: Vital signs in last 24 hours: Temp:  [98 F (36.7 C)-98.6 F (37 C)] 98.1 F (36.7 C) (05/11 0734) Pulse Rate:  [58-90] 81 (05/11 0900) Cardiac Rhythm:  [-] Atrial paced (05/11 0800) Resp:  [10-21] 18 (05/11 0900) BP: (97-156)/(52-79) 107/55 mmHg (05/11 0900) SpO2:  [91 %-100 %] 97 % (05/11 0900) Weight:  [187 lb 6.3 oz (85 kg)] 187 lb 6.3 oz (85 kg) (05/11 0600)  Hemodynamic parameters for last 24 hours:   normal sinus rhythm  Intake/Output from previous day: 05/10 0701 - 05/11 0700 In: 1488.9 [P.O.:840; I.V.:598.9; IV Piggyback:50] Out: 2060 [Urine:1540; Chest Tube:520] Intake/Output this shift: Total I/O In: 40 [I.V.:40] Out: 155 [Urine:135; Chest Tube:20]  Exam Lungs clear Heart rate regular Mild peripheral edema  Lab Results:  Recent Labs  05/19/12 1645 05/19/12 1649 05/20/12 0400  WBC 15.0*  --  11.8*  HGB 8.4* 8.2* 7.9*  HCT 23.3* 24.0* 22.1*  PLT 110*  --  98*   BMET:  Recent Labs  05/19/12 0420  05/19/12 1649 05/20/12 0400  NA 137  --  136 135  K 4.4  --  4.2 3.8  CL 105  --  101 101  CO2 24  --   --  26  GLUCOSE 128*  --  147* 149*  BUN 13  --  16 20  CREATININE 0.97  < > 1.10 1.15  CALCIUM 7.9*  --   --  8.1*  < > = values in this interval not  displayed.  PT/INR:  Recent Labs  05/20/12 0400  LABPROT 15.2  INR 1.22   ABG    Component Value Date/Time   PHART 7.372 05/19/2012 0533   HCO3 21.9 05/19/2012 0533   TCO2 24 05/19/2012 1649   ACIDBASEDEF 3.0* 05/19/2012 0533   O2SAT 60.6 05/19/2012 1640   CBG (last 3)   Recent Labs  05/19/12 2056 05/20/12 0439 05/20/12 0733  GLUCAP 144* 126* 132*    Assessment/Plan: S/P Procedure(s) (LRB): CORONARY ARTERY BYPASS GRAFTING (CABG) (N/A) INTRAOPERATIVE TRANSESOPHAGEAL ECHOCARDIOGRAM (N/A) MAZE (N/A) CLIPPING OF ATRIAL APPENDAGE (Left) Hold Coumadin today Transfusion one unit of packed cells for postop anemia,   Preop Plavix   LOS: 4 days    VAN TRIGT III,Aibhlinn Kalmar 05/20/2012

## 2012-05-20 NOTE — Progress Notes (Signed)
In to see patient at this time, patient is confused and disoriented. Patient is refusing care and will not allow his blood sugar to be taken. Patient has refused midnight meds as well, and states that the medication is "a trick and will only make him sick."  Patient states that "we are only here to take all of his blood, and that he is going to probably die tonight." Patient informed of the benefits and importance of suggested medical treatment. Patients wishes are to not be touched and he will not take anything. Will continue to monitor the patient closely.

## 2012-05-20 NOTE — Plan of Care (Signed)
Problem: Phase II Progression Outcomes Goal: Dangles within 2 hrs after extubation Outcome: Not Met (add Reason) Femoral art line. Patient unable to dangle 2 hours after extubation.

## 2012-05-21 ENCOUNTER — Inpatient Hospital Stay (HOSPITAL_COMMUNITY): Payer: Medicare Other

## 2012-05-21 LAB — TYPE AND SCREEN
ABO/RH(D): B POS
Antibody Screen: NEGATIVE
Unit division: 0
Unit division: 0
Unit division: 0
Unit division: 0
Unit division: 0
Unit division: 0

## 2012-05-21 LAB — COMPREHENSIVE METABOLIC PANEL
ALT: 17 U/L (ref 0–53)
AST: 23 U/L (ref 0–37)
Albumin: 2.8 g/dL — ABNORMAL LOW (ref 3.5–5.2)
Alkaline Phosphatase: 58 U/L (ref 39–117)
BUN: 27 mg/dL — ABNORMAL HIGH (ref 6–23)
CO2: 27 mEq/L (ref 19–32)
Calcium: 8.1 mg/dL — ABNORMAL LOW (ref 8.4–10.5)
Chloride: 101 mEq/L (ref 96–112)
Creatinine, Ser: 1.13 mg/dL (ref 0.50–1.35)
GFR calc Af Amer: 70 mL/min — ABNORMAL LOW (ref >90)
GFR calc non Af Amer: 61 mL/min — ABNORMAL LOW (ref >90)
Glucose, Bld: 105 mg/dL — ABNORMAL HIGH (ref 70–99)
Potassium: 3.8 mEq/L (ref 3.5–5.1)
Sodium: 134 mEq/L — ABNORMAL LOW (ref 135–145)
Total Bilirubin: 0.9 mg/dL (ref 0.3–1.2)
Total Protein: 4.9 g/dL — ABNORMAL LOW (ref 6.0–8.3)

## 2012-05-21 LAB — CBC
HCT: 24.4 % — ABNORMAL LOW (ref 39.0–52.0)
Hemoglobin: 8.7 g/dL — ABNORMAL LOW (ref 13.0–17.0)
MCH: 29.5 pg (ref 26.0–34.0)
MCHC: 35.7 g/dL (ref 30.0–36.0)
MCV: 82.7 fL (ref 78.0–100.0)
Platelets: 104 10*3/uL — ABNORMAL LOW (ref 150–400)
RBC: 2.95 MIL/uL — ABNORMAL LOW (ref 4.22–5.81)
RDW: 14 % (ref 11.5–15.5)
WBC: 7.3 10*3/uL (ref 4.0–10.5)

## 2012-05-21 LAB — PROTIME-INR
INR: 1.14 (ref 0.00–1.49)
Prothrombin Time: 14.4 seconds (ref 11.6–15.2)

## 2012-05-21 LAB — GLUCOSE, CAPILLARY
Glucose-Capillary: 140 mg/dL — ABNORMAL HIGH (ref 70–99)
Glucose-Capillary: 111 mg/dL — ABNORMAL HIGH (ref 70–99)
Glucose-Capillary: 94 mg/dL (ref 70–99)

## 2012-05-21 MED ORDER — POTASSIUM CHLORIDE CRYS ER 20 MEQ PO TBCR
20.0000 meq | EXTENDED_RELEASE_TABLET | Freq: Every day | ORAL | Status: DC
  Filled 2012-05-21: qty 1

## 2012-05-21 MED ORDER — SODIUM CHLORIDE 0.9 % IJ SOLN: 3.0000 mL | INTRAMUSCULAR | Status: DC | PRN

## 2012-05-21 MED ORDER — MOVING RIGHT ALONG BOOK
Freq: Once | Status: AC
  Administered 2012-05-21: 1
  Filled 2012-05-21: qty 1

## 2012-05-21 MED ORDER — MORPHINE SULFATE 2 MG/ML IJ SOLN: 2.0000 mg | INTRAMUSCULAR | Status: DC | PRN

## 2012-05-21 MED ORDER — POTASSIUM CHLORIDE 10 MEQ/50ML IV SOLN
10.0000 meq | INTRAVENOUS | Status: AC
  Administered 2012-05-21: 10 meq via INTRAVENOUS

## 2012-05-21 MED ORDER — BISACODYL 5 MG PO TBEC: 10.0000 mg | DELAYED_RELEASE_TABLET | Freq: Every day | ORAL | Status: DC | PRN

## 2012-05-21 MED ORDER — FUROSEMIDE 40 MG PO TABS
40.0000 mg | ORAL_TABLET | Freq: Every day | ORAL | Status: DC
  Administered 2012-05-22 – 2012-05-23 (×2): 40 mg via ORAL
  Filled 2012-05-21 (×2): qty 1

## 2012-05-21 MED ORDER — SODIUM CHLORIDE 0.9 % IV SOLN: 250.0000 mL | INTRAVENOUS | Status: DC | PRN

## 2012-05-21 MED ORDER — BISACODYL 10 MG RE SUPP: 10.0000 mg | Freq: Every day | RECTAL | Status: DC | PRN

## 2012-05-21 MED ORDER — POTASSIUM CHLORIDE 10 MEQ/50ML IV SOLN
INTRAVENOUS | Status: AC
  Administered 2012-05-21: 10 meq via INTRAVENOUS
  Filled 2012-05-21: qty 50

## 2012-05-21 MED ORDER — MAGNESIUM HYDROXIDE 400 MG/5ML PO SUSP: 30.0000 mL | Freq: Every day | ORAL | Status: DC | PRN

## 2012-05-21 MED ORDER — SODIUM CHLORIDE 0.9 % IJ SOLN
3.0000 mL | Freq: Two times a day (BID) | INTRAMUSCULAR | Status: DC
  Administered 2012-05-21 – 2012-05-22 (×4): 3 mL via INTRAVENOUS

## 2012-05-21 MED ORDER — BISACODYL 10 MG RE SUPP: 10.0000 mg | Freq: Once | RECTAL | Status: DC

## 2012-05-21 MED ORDER — INSULIN ASPART 100 UNIT/ML ~~LOC~~ SOLN
0.0000 [IU] | Freq: Three times a day (TID) | SUBCUTANEOUS | Status: DC
  Administered 2012-05-21: 2 [IU] via SUBCUTANEOUS

## 2012-05-21 MED ORDER — FUROSEMIDE 10 MG/ML IJ SOLN
40.0000 mg | Freq: Once | INTRAMUSCULAR | Status: AC
  Administered 2012-05-21: 40 mg via INTRAVENOUS

## 2012-05-21 MED ORDER — MIDAZOLAM HCL 2 MG/2ML IJ SOLN: 2.0000 mg | INTRAMUSCULAR | Status: DC | PRN

## 2012-05-21 NOTE — Progress Notes (Signed)
Pt transferred to 2017 via wheelchair. Pt tolerated transfer without difficulty. O2 sa'ts remain stable. Pt positioned on bedside commode, placed on monitor and report given to [place], Charity fundraiser.

## 2012-05-21 NOTE — Progress Notes (Signed)
Pt transferred from 2300, VSS, pt had BM/voided before getting into bed to rest. Will continue to monitor.

## 2012-05-21 NOTE — Progress Notes (Signed)
PROGRESS NOTE  Subjective:   [name] is a 79 yo with hx of CAD, s/p CABG ( LIMA to LAD, SVG to OM1, SVG to OM2, SVG to PDA, ligation of left atrial appendage, RF MAZE procedure.  He is making progress.   Objective:    Vital Signs:   Temp:  [97.9 F (36.6 C)-98.6 F (37 C)] 97.9 F (36.6 C) (05/12 0718) Pulse Rate:  [37-82] 68 (05/12 0600) Resp:  [9-24] 13 (05/12 0600) BP: (98-136)/(55-73) 110/71 mmHg (05/12 0600) SpO2:  [93 %-98 %] 95 % (05/12 0600) Weight:  [186 lb 11.7 oz (84.7 kg)] 186 lb 11.7 oz (84.7 kg) (05/12 0600)  Last BM Date: 05/15/12   24-hour weight change: Weight change: -10.6 oz (-0.3 kg)  Weight trends: Filed Weights   05/19/12 0500 05/20/12 0600 05/21/12 0600  Weight: 187 lb 13.3 oz (85.2 kg) 187 lb 6.3 oz (85 kg) 186 lb 11.7 oz (84.7 kg)    Intake/Output:  05/11 0701 - 05/12 0700 In: 1810 [P.O.:900; I.V.:460; Blood:350; IV Piggyback:100] Out: 2175 [Urine:2035; Chest Tube:140]     Physical Exam: BP 110/71  Pulse 68  Temp(Src) 97.9 F (36.6 C) (Oral)  Resp 13  Ht 6\' 1"  (1.854 m)  Wt 186 lb 11.7 oz (84.7 kg)  BMI 24.64 kg/m2  SpO2 95%  General: Vital signs reviewed and noted.   Head: Normocephalic, atraumatic.  Eyes: conjunctivae/corneas clear.  EOM's intact.   Throat: normal  Neck:  normal  Lungs:   slightly decreased breath sounds  Heart:  RR  Abdomen:  Soft, non-tender, non-distended    Extremities: Trace - 1+ edema   Neurologic: A&O X3, CN II - XII are grossly intact.   Psych: Normal     Labs: BMET:  Recent Labs  05/19/12 0420 05/19/12 1645  05/20/12 0400 05/21/12 0340  NA 137  --   < > 135 134*  K 4.4  --   < > 3.8 3.8  CL 105  --   < > 101 101  CO2 24  --   --  26 27  GLUCOSE 128*  --   < > 149* 105*  BUN 13  --   < > 20 27*  CREATININE 0.97 1.18  < > 1.15 1.13  CALCIUM 7.9*  --   --  8.1* 8.1*  MG 2.6* 2.4  --   --   --   < > = values in this interval not displayed.  Liver function tests:  Recent Labs  05/20/12 0400 05/21/12 0340  AST 35 23  ALT 18 17  ALKPHOS 55 58  BILITOT 1.0 0.9  PROT 5.2* 4.9*  ALBUMIN 3.1* 2.8*   No results found for this basename: LIPASE, AMYLASE,  in the last 72 hours  CBC:  Recent Labs  05/20/12 1700 05/21/12 0340  WBC 10.0 7.3  HGB 9.3* 8.7*  HCT 26.1* 24.4*  MCV 82.1 82.7  PLT 107* 104*    Cardiac Enzymes: No results found for this basename: CKTOTAL, CKMB, TROPONINI,  in the last 72 hours  Coagulation Studies:  Recent Labs  05/18/12 1530 05/20/12 0400 05/21/12 0340  LABPROT 18.0* 15.2 14.4  INR 1.54* 1.22 1.14    Other:   Other results:  Tele:  NSR  Medications:    Infusions: . sodium chloride Stopped (05/19/12 1100)  . sodium chloride 250 mL (05/19/12 1100)    Scheduled Medications: . acetaminophen  1,000 mg Oral Q6H   Or  . acetaminophen (TYLENOL)  oral liquid 160 mg/5 mL  975 mg Per Tube Q6H  . amiodarone  200 mg Oral BID  . aspirin EC  325 mg Oral Daily   Or  . aspirin  324 mg Per Tube Daily  . bisacodyl  10 mg Oral Daily   Or  . bisacodyl  10 mg Rectal Daily  . docusate sodium  200 mg Oral Daily  . ezetimibe-simvastatin  1 tablet Oral QHS  . ferrous fumarate-b12-vitamic C-folic acid  1 capsule Oral TID PC  . insulin aspart  0-24 Units Subcutaneous Q4H  . metoprolol tartrate  12.5 mg Oral BID  . pantoprazole  40 mg Oral Daily  . sodium chloride  3 mL Intravenous Q12H  . Testosterone  2 application Transdermal Daily    Assessment/ Plan:    1. CAD:  S/p CABG, MAZE procedure.  Doing well.  Continue current meds.   2. PAF:  In NSR this am.     Disposition:  Length of Stay: 5  Vesta Mixer, Montez Hageman., MD, [facility] 05/21/2012, 7:30 AM Office [phone] Pager [phone]

## 2012-05-21 NOTE — Evaluation (Signed)
Physical Therapy Evaluation Patient Details Name: [name] MRN: [id number] DOB: [birth year] Today's Date: 05/21/2012 Time: [id number] PT Time Calculation (min): 16 min  PT Assessment / Plan / Recommendation Clinical Impression  Pt s/p CABG with decr mobility secondary to decr endurance and self limiting today.  Pt stated he was fatigued secondary to ambulation this am.  Should progress well.  Will need RW, 3N1, and HHPT f/u.       PT Assessment  Patient needs continued PT services    Follow Up Recommendations  Home health PT;Supervision/Assistance - 24 hour                Equipment Recommendations  Rolling walker with 5" wheels;Other (comment) (3N1)         Frequency Min 3X/week    Precautions / Restrictions Precautions Precautions: Fall;Sternal Restrictions Weight Bearing Restrictions: No   Pertinent Vitals/Pain VSS, No pain      Mobility  Bed Mobility Bed Mobility: Rolling Left;Left Sidelying to Sit Rolling Left: 4: Min assist Left Sidelying to Sit: 3: Mod assist;HOB flat Details for Bed Mobility Assistance: Pt cued to hold pillow.  Needed assist for elevation of trunk. Transfers Transfers: Sit to Stand;Stand to Sit Sit to Stand: 1: +2 Total assist;From elevated surface;From bed;Without upper extremity assist Sit to Stand: Patient Percentage: 70% Stand to Sit: 1: +2 Total assist;Without upper extremity assist;To chair/3-in-1 Stand to Sit: Patient Percentage: 70% Details for Transfer Assistance: cues for hand placement and for initiation of movement Ambulation/Gait Ambulation/Gait Assistance: 1: +2 Total assist Ambulation/Gait: Patient Percentage: 70% Ambulation Distance (Feet): 75 Feet Assistive device: Other (Comment) (pushing wheelchair) Ambulation/Gait Assistance Details: Pt needed cues for safety pushing wheelchair.  Pt c/o weakness in LEs.  Pt slightly unsteady during ambulation with impulsivity noted at times.   Pt rushing to ambulate back to room  thus causing incr instability.  Pt self limiting ambulation this am.   Gait Pattern: Step-through pattern;Decreased stride length;Decreased step length - left;Decreased step length - right;Trunk flexed;Shuffle Gait velocity: decreased Stairs: No Wheelchair Mobility Wheelchair Mobility: No         PT Diagnosis: Generalized weakness  PT Problem List: Decreased activity tolerance;Decreased balance;Decreased mobility;Decreased safety awareness;Decreased knowledge of use of DME;Decreased knowledge of precautions PT Treatment Interventions: DME instruction;Gait training;Functional mobility training;Therapeutic activities;Therapeutic exercise;Balance training;Patient/family education;Stair training   PT Goals Acute Rehab PT Goals PT Goal Formulation: With patient Time For Goal Achievement: 06/04/12 Potential to Achieve Goals: Good Pt will go Supine/Side to Sit: Independently PT Goal: Supine/Side to Sit - Progress: Goal set today Pt will go Sit to Stand: Independently PT Goal: Sit to Stand - Progress: Goal set today Pt will Ambulate: 51 - 150 feet;with modified independence;with least restrictive assistive device PT Goal: Ambulate - Progress: Goal set today Pt will Go Up / Down Stairs: 3-5 stairs;with modified independence;with least restrictive assistive device PT Goal: Up/Down Stairs - Progress: Goal set today  Visit Information  Last PT Received On: 05/21/12 Assistance Needed: +2    Subjective Data  Subjective: "My legs feel very weak." Patient Stated Goal: to go home   Prior Functioning  Home Living Lives With: Spouse Available Help at Discharge: Family Type of Home: House Home Access: Stairs to enter Secretary/administrator of Steps: 2 Entrance Stairs-Rails: None Home Layout: One level Bathroom Shower/Tub: Health visitor: Standard Home Adaptive Equipment: None Prior Function Level of Independence: Independent Able to Take Stairs?: Yes Driving:  Yes Vocation: Retired Musician: No difficulties  Cognition  Cognition Arousal/Alertness: Awake/alert Behavior During Therapy: WFL for tasks assessed/performed Overall Cognitive Status: Within Functional Limits for tasks assessed    Extremity/Trunk Assessment Right Lower Extremity Assessment RLE ROM/Strength/Tone: [facility] for tasks assessed Left Lower Extremity Assessment LLE ROM/Strength/Tone: WFL for tasks assessed Trunk Assessment Trunk Assessment: Normal   Balance Static Sitting Balance Static Sitting - Balance Support: Feet supported;No upper extremity supported Static Sitting - Level of Assistance: 4: Min assist Static Sitting - Comment/# of Minutes: 3 Static Standing Balance Static Standing - Balance Support: During functional activity;Bilateral upper extremity supported Static Standing - Level of Assistance: 5: Stand by assistance Static Standing - Comment/# of Minutes: 2  End of Session PT - End of Session Equipment Utilized During Treatment: Gait belt Activity Tolerance: Patient limited by fatigue Patient left: in chair;with call bell/phone within reach;with family/visitor present Nurse Communication: Mobility status       INGOLD,Calie Buttrey 05/21/2012, 1:44 PM [employer] Acute [facility] [phone] (pager)

## 2012-05-21 NOTE — Progress Notes (Signed)
3 Days Post-Op Procedure(s) (LRB): CORONARY ARTERY BYPASS GRAFTING (CABG) (N/A) INTRAOPERATIVE TRANSESOPHAGEAL ECHOCARDIOGRAM (N/A) MAZE (N/A) CLIPPING OF ATRIAL APPENDAGE (Left) Subjective: Stable night nsr Min CT drainage Anemia stable- cont po iron   Objective: Vital signs in last 24 hours: Temp:  [97.9 F (36.6 C)-98.6 F (37 C)] 97.9 F (36.6 C) (05/12 0718) Pulse Rate:  [37-82] 70 (05/12 0700) Cardiac Rhythm:  [-] Normal sinus rhythm (05/12 0700) Resp:  [9-24] 14 (05/12 0700) BP: (98-136)/(55-73) 117/69 mmHg (05/12 0700) SpO2:  [93 %-98 %] 98 % (05/12 0700) Weight:  [186 lb 11.7 oz (84.7 kg)] 186 lb 11.7 oz (84.7 kg) (05/12 0600)  Hemodynamic parameters for last 24 hours:  stable  Intake/Output from previous day: 05/11 0701 - 05/12 0700 In: 1830 [P.O.:900; I.V.:480; Blood:350; IV Piggyback:100] Out: 2225 [Urine:2085; Chest Tube:140] Intake/Output this shift:    Lungs clear Sitting in chair  Lab Results:  Recent Labs  05/20/12 1700 05/21/12 0340  WBC 10.0 7.3  HGB 9.3* 8.7*  HCT 26.1* 24.4*  PLT 107* 104*   BMET:  Recent Labs  05/20/12 0400 05/21/12 0340  NA 135 134*  K 3.8 3.8  CL 101 101  CO2 26 27  GLUCOSE 149* 105*  BUN 20 27*  CREATININE 1.15 1.13  CALCIUM 8.1* 8.1*    PT/INR:  Recent Labs  05/21/12 0340  LABPROT 14.4  INR 1.14   ABG    Component Value Date/Time   PHART 7.372 05/19/2012 0533   HCO3 21.9 05/19/2012 0533   TCO2 24 05/19/2012 1649   ACIDBASEDEF 3.0* 05/19/2012 0533   O2SAT 60.6 05/19/2012 1640   CBG (last 3)   Recent Labs  05/20/12 2328 05/21/12 0358 05/21/12 0716  GLUCAP 115* 111* 93    Assessment/Plan: S/P Procedure(s) (LRB): CORONARY ARTERY BYPASS GRAFTING (CABG) (N/A) INTRAOPERATIVE TRANSESOPHAGEAL ECHOCARDIOGRAM (N/A) MAZE (N/A) CLIPPING OF ATRIAL APPENDAGE (Left) Plan for transfer to step-down: see transfer orders   LOS: 5 days    VAN TRIGT III,Aleric Froelich 05/21/2012

## 2012-05-21 NOTE — Progress Notes (Signed)
Pt stated he walked twice today and did not want to walk again because his legs were weak and he was tired. Advised pt to try to complete three walks tomorrow.

## 2012-05-22 ENCOUNTER — Inpatient Hospital Stay (HOSPITAL_COMMUNITY): Payer: Medicare Other

## 2012-05-22 LAB — PROTIME-INR
INR: 1.08 (ref 0.00–1.49)
Prothrombin Time: 13.9 seconds (ref 11.6–15.2)

## 2012-05-22 LAB — CBC
HCT: 24.9 % — ABNORMAL LOW (ref 39.0–52.0)
Hemoglobin: 8.6 g/dL — ABNORMAL LOW (ref 13.0–17.0)
MCH: 29.1 pg (ref 26.0–34.0)
MCHC: 34.5 g/dL (ref 30.0–36.0)
MCV: 84.1 fL (ref 78.0–100.0)
Platelets: 137 10*3/uL — ABNORMAL LOW (ref 150–400)
RBC: 2.96 MIL/uL — ABNORMAL LOW (ref 4.22–5.81)
RDW: 14.1 % (ref 11.5–15.5)
WBC: 8.7 10*3/uL (ref 4.0–10.5)

## 2012-05-22 LAB — BASIC METABOLIC PANEL
BUN: 23 mg/dL (ref 6–23)
CO2: 27 mEq/L (ref 19–32)
Calcium: 8.5 mg/dL (ref 8.4–10.5)
Chloride: 102 mEq/L (ref 96–112)
Creatinine, Ser: 1.09 mg/dL (ref 0.50–1.35)
GFR calc Af Amer: 74 mL/min — ABNORMAL LOW (ref >90)
GFR calc non Af Amer: 63 mL/min — ABNORMAL LOW (ref >90)
Glucose, Bld: 115 mg/dL — ABNORMAL HIGH (ref 70–99)
Potassium: 3.3 mEq/L — ABNORMAL LOW (ref 3.5–5.1)
Sodium: 138 mEq/L (ref 135–145)

## 2012-05-22 LAB — GLUCOSE, CAPILLARY: Glucose-Capillary: 119 mg/dL — ABNORMAL HIGH (ref 70–99)

## 2012-05-22 MED ORDER — WARFARIN - PHYSICIAN DOSING INPATIENT: Freq: Every day | Status: DC

## 2012-05-22 MED ORDER — ASPIRIN EC 81 MG PO TBEC
81.0000 mg | DELAYED_RELEASE_TABLET | Freq: Every day | ORAL | Status: DC
  Administered 2012-05-22 – 2012-05-23 (×2): 81 mg via ORAL
  Filled 2012-05-22 (×2): qty 1

## 2012-05-22 MED ORDER — POTASSIUM CHLORIDE CRYS ER 20 MEQ PO TBCR
40.0000 meq | EXTENDED_RELEASE_TABLET | Freq: Two times a day (BID) | ORAL | Status: AC
  Administered 2012-05-22 – 2012-05-23 (×2): 40 meq via ORAL
  Filled 2012-05-22: qty 2
  Filled 2012-05-22: qty 1

## 2012-05-22 MED ORDER — WARFARIN SODIUM 2.5 MG PO TABS
2.5000 mg | ORAL_TABLET | Freq: Every day | ORAL | Status: DC
  Administered 2012-05-22: 2.5 mg via ORAL
  Filled 2012-05-22 (×2): qty 1

## 2012-05-22 MED FILL — Potassium Chloride Inj 2 mEq/ML: INTRAVENOUS | Qty: 40 | Status: AC

## 2012-05-22 MED FILL — Heparin Sodium (Porcine) Inj 1000 Unit/ML: INTRAMUSCULAR | Qty: 30 | Status: AC

## 2012-05-22 MED FILL — Sodium Chloride IV Soln 0.9%: INTRAVENOUS | Qty: 1000 | Status: AC

## 2012-05-22 MED FILL — Magnesium Sulfate Inj 50%: INTRAMUSCULAR | Qty: 10 | Status: AC

## 2012-05-22 NOTE — Progress Notes (Signed)
EPW D/C'd, pt tolerated well, no ectopy noted. VSS, monitoring per protocol.

## 2012-05-22 NOTE — Progress Notes (Signed)
CARDIAC REHAB PHASE I   PRE:  Rate/Rhythm: 67 SR   BP: sitting 113/54    SaO2: 96 RA  MODE:  Ambulation: 300 ft   POST:  Rate/Rhythm: 76 SR    BP: sitting 101/53     SaO2: 100 RA  Fairly steady with RW. Some scissoring of feet. Rest x1. "I just want to get it over with". Needed encouragement to go extra 150 ft. Overall doing well.  [id number]   Elissa Lovett Carson CES, ACSM 05/22/2012 3:29 PM

## 2012-05-22 NOTE — Progress Notes (Signed)
Pt ambulated 150 ft in hallway with RN using front wheel walker. Pt tolerated well, back in bed resting. Will continue to monitor.

## 2012-05-22 NOTE — Progress Notes (Addendum)
[address]       [place] [address]             [phone]    4 Days Post-Op  Procedure(s) (LRB): CORONARY ARTERY BYPASS GRAFTING (CABG) (N/A) INTRAOPERATIVE TRANSESOPHAGEAL ECHOCARDIOGRAM (N/A) MAZE (N/A) CLIPPING OF ATRIAL APPENDAGE (Left) Subjective: Feels weak, but improving  Objective  Telemetry sinus rhythm  Temp:  [98.1 F (36.7 C)-99 F (37.2 C)] 98.7 F (37.1 C) (05/13 0558) Pulse Rate:  [67-75] 71 (05/13 0558) Resp:  [15-20] 20 (05/13 0558) BP: (96-125)/(55-65) 118/65 mmHg (05/13 0558) SpO2:  [95 %-100 %] 98 % (05/13 0558) Weight:  [175 lb 4.3 oz (79.5 kg)] 175 lb 4.3 oz (79.5 kg) (05/13 0558)   Intake/Output Summary (Last 24 hours) at 05/22/12 0801 Last data filed at 05/22/12 0603  Gross per 24 hour  Intake    620 ml  Output   3015 ml  Net  -2395 ml       General appearance: alert, cooperative and no distress Heart: regular rate and rhythm Lungs: clear to auscultation bilaterally Abdomen: soft, nontender Extremities: R>L LE edema Wound: incisions healing well  Lab Results:  Recent Labs  05/19/12 1645  05/21/12 0340 05/22/12 0500  NA  --   < > 134* 138  K  --   < > 3.8 3.3*  CL  --   < > 101 102  CO2  --   < > 27 27  GLUCOSE  --   < > 105* 115*  BUN  --   < > 27* 23  CREATININE 1.18  < > 1.13 1.09  CALCIUM  --   < > 8.1* 8.5  MG 2.4  --   --   --   < > = values in this interval not displayed.  Recent Labs  05/20/12 0400 05/21/12 0340  AST 35 23  ALT 18 17  ALKPHOS 55 58  BILITOT 1.0 0.9  PROT 5.2* 4.9*  ALBUMIN 3.1* 2.8*   No results found for this basename: LIPASE, AMYLASE,  in the last 72 hours  Recent Labs  05/21/12 0340 05/22/12 0500  WBC 7.3 8.7  HGB 8.7* 8.6*  HCT 24.4* 24.9*  MCV 82.7 84.1  PLT 104* 137*   No results found for this basename: CKTOTAL, CKMB, TROPONINI,  in the last 72 hours No components found with this basename: POCBNP,  No results found for this basename: DDIMER,  in the  last 72 hours No results found for this basename: HGBA1C,  in the last 72 hours No results found for this basename: CHOL, HDL, LDLCALC, TRIG, CHOLHDL,  in the last 72 hours No results found for this basename: TSH, T4TOTAL, FREET3, T3FREE, THYROIDAB,  in the last 72 hours No results found for this basename: VITAMINB12, FOLATE, FERRITIN, TIBC, IRON, RETICCTPCT,  in the last 72 hours  Medications: Scheduled . acetaminophen  1,000 mg Oral Q6H   Or  . acetaminophen (TYLENOL) oral liquid 160 mg/5 mL  975 mg Per Tube Q6H  . amiodarone  200 mg Oral BID  . aspirin EC  325 mg Oral Daily   Or  . aspirin  324 mg Per Tube Daily  . bisacodyl  10 mg Oral Daily   Or  . bisacodyl  10 mg Rectal Daily  . bisacodyl  10 mg Rectal Once  . docusate sodium  200 mg Oral Daily  . ezetimibe-simvastatin  1 tablet Oral QHS  . ferrous fumarate-b12-vitamic C-folic acid  1  capsule Oral TID PC  . furosemide  40 mg Oral Daily  . insulin aspart  0-24 Units Subcutaneous TID AC & HS  . metoprolol tartrate  12.5 mg Oral BID  . pantoprazole  40 mg Oral Daily  . potassium chloride  20 mEq Oral Daily  . sodium chloride  3 mL Intravenous Q12H  . Testosterone  2 application Transdermal Daily     Radiology/Studies:  Dg Chest Port 1 View  05/21/2012  *RADIOLOGY REPORT*  Clinical Data: Status post CABG.  Hyperlipidemia.  High cholesterol.  PORTABLE CHEST - 1 VIEW  Comparison: 05/20/2012  Findings: Right IJ Cordis sheath is unchanged in position.  A left- sided chest tube is similar.  Prior median sternotomy. Cardiomegaly accentuated by AP portable technique.  Increase in moderate left hemidiaphragm elevation. Small left pleural effusion is not significantly changed. No pneumothorax.  No congestive failure.  Similar left greater than right bibasilar atelectasis.  IMPRESSION: Increase in left base volume loss with left hemidiaphragm elevation.  Trace left pleural fluid, without pneumothorax.   Original Report Authenticated By:  Jeronimo Greaves, M.D.     INR: Will add last result for INR, ABG once components are confirmed Will add last 4 CBG results once components are confirmed  Assessment/Plan: S/P Procedure(s) (LRB): CORONARY ARTERY BYPASS GRAFTING (CABG) (N/A) INTRAOPERATIVE TRANSESOPHAGEAL ECHOCARDIOGRAM (N/A) MAZE (N/A) CLIPPING OF ATRIAL APPENDAGE (Left)  1. Doing well 2 cont diuresis,replace K+  3 In sinus rhythm 4 anemia stable 5 rehab/pulm toilet- cont 6 ? AC RX    LOS: 6 days    GOLD,WAYNE E 5/13/20148:01 AM  Start low dose coumadin now that Hb stable and remove EPW's Stop CBG's, SSI

## 2012-05-23 DIAGNOSIS — Z951 Presence of aortocoronary bypass graft: Secondary | ICD-10-CM

## 2012-05-23 DIAGNOSIS — Z9889 Other specified postprocedural states: Secondary | ICD-10-CM

## 2012-05-23 DIAGNOSIS — Z8679 Personal history of other diseases of the circulatory system: Secondary | ICD-10-CM

## 2012-05-23 LAB — BASIC METABOLIC PANEL
Calcium: 8.7 mg/dL (ref 8.4–10.5)
GFR calc non Af Amer: 64 mL/min — ABNORMAL LOW (ref >90)
Glucose, Bld: 96 mg/dL (ref 70–99)
Sodium: 138 mEq/L (ref 135–145)

## 2012-05-23 LAB — CBC
HCT: 26.1 % — ABNORMAL LOW (ref 39.0–52.0)
Hemoglobin: 8.8 g/dL — ABNORMAL LOW (ref 13.0–17.0)
MCH: 28.9 pg (ref 26.0–34.0)
MCHC: 33.7 g/dL (ref 30.0–36.0)
MCV: 85.6 fL (ref 78.0–100.0)
Platelets: 179 10*3/uL (ref 150–400)
RBC: 3.05 MIL/uL — ABNORMAL LOW (ref 4.22–5.81)
RDW: 14.1 % (ref 11.5–15.5)
WBC: 10.3 10*3/uL (ref 4.0–10.5)

## 2012-05-23 LAB — PROTIME-INR
INR: 1.21 (ref 0.00–1.49)
Prothrombin Time: 15.1 seconds (ref 11.6–15.2)

## 2012-05-23 MED ORDER — FUROSEMIDE 40 MG PO TABS: 40.0000 mg | ORAL_TABLET | Freq: Every day | ORAL | Status: DC

## 2012-05-23 MED ORDER — METOPROLOL TARTRATE 25 MG PO TABS: 12.5000 mg | ORAL_TABLET | Freq: Two times a day (BID) | ORAL | Status: DC

## 2012-05-23 MED ORDER — FE FUMARATE-B12-VIT C-FA-IFC PO CAPS: 1.0000 | ORAL_CAPSULE | Freq: Three times a day (TID) | ORAL | Status: DC

## 2012-05-23 MED ORDER — TRAMADOL HCL 50 MG PO TABS: 50.0000 mg | ORAL_TABLET | Freq: Four times a day (QID) | ORAL | Status: DC | PRN

## 2012-05-23 MED ORDER — WARFARIN SODIUM 2.5 MG PO TABS: 2.5000 mg | ORAL_TABLET | Freq: Every day | ORAL | Status: DC

## 2012-05-23 MED ORDER — AMIODARONE HCL 200 MG PO TABS: 200.0000 mg | ORAL_TABLET | Freq: Two times a day (BID) | ORAL | Status: DC

## 2012-05-23 MED ORDER — POTASSIUM CHLORIDE CRYS ER 20 MEQ PO TBCR: 40.0000 meq | EXTENDED_RELEASE_TABLET | Freq: Two times a day (BID) | ORAL | Status: DC

## 2012-05-23 MED FILL — Mannitol IV Soln 20%: INTRAVENOUS | Qty: 500 | Status: AC

## 2012-05-23 MED FILL — Sodium Bicarbonate IV Soln 8.4%: INTRAVENOUS | Qty: 50 | Status: AC

## 2012-05-23 MED FILL — Sodium Chloride Irrigation Soln 0.9%: Qty: 3000 | Status: AC

## 2012-05-23 MED FILL — Lidocaine HCl IV Inj 20 MG/ML: INTRAVENOUS | Qty: 5 | Status: AC

## 2012-05-23 MED FILL — Electrolyte-R (PH 7.4) Solution: INTRAVENOUS | Qty: 1000 | Status: AC

## 2012-05-23 NOTE — Discharge Summary (Signed)
Physician Discharge Summary  Patient ID: [name] MRN: [id number] DOB/AGE: 77-Mar-1934 77 y.o.  Admit date: 05/16/2012 Discharge date: 05/23/2012  Admission Diagnoses:  Patient Active Problem List   Diagnosis Date Noted  . Medicare annual wellness visit, subsequent 10/27/2011  . Hypogonadism male 06/22/2011  . Prostate cancer 06/22/2011  . Screening for colon cancer 04/27/2011  . Insomnia 10/05/2010  . HYPERLIPIDEMIA-MIXED 06/17/2009  . HYPERTENSION, BENIGN 06/17/2009  . CAD, NATIVE VESSEL 06/17/2009  . ATRIAL FIBRILLATION 06/17/2009   Discharge Diagnoses:   Patient Active Problem List   Diagnosis Date Noted  . S/P CABG x 4 05/23/2012  . S/P Maze operation for atrial fibrillation 05/23/2012  . Medicare annual wellness visit, subsequent 10/27/2011  . Hypogonadism male 06/22/2011  . Prostate cancer 06/22/2011  . Screening for colon cancer 04/27/2011  . Insomnia 10/05/2010  . HYPERLIPIDEMIA-MIXED 06/17/2009  . HYPERTENSION, BENIGN 06/17/2009  . CAD, NATIVE VESSEL 06/17/2009  . ATRIAL FIBRILLATION 06/17/2009   Discharged Condition: good  History of Present Illness:   [name] is a 79 yo white male with known history of CAD S/P PCI to LAD and RCA and Atrial Fibrillation.  He presented to [facility] Emergency Department on 05/16/2012 with complaints of chest pain.  The patient stated that he was evaluated at [facility] the day before with complaints of chest pain and was discharged home.  The patient states his pain began again while feeding his cats.  He stated he took his Imdur and Protonix and went to sleep.  The pain persisted approximately 6 hours at which time the patient alerted his wife who transported him to the Emergency Department.  Workup in the ED was unremarkable.  His cardiac enzymes were 0.00 and no EKG changes were observed.  He was evaluated by [facility] Card[facility] who felt this was most likely angina.  However, it was felt the patient would benefit from  Cardiac catheterization being he would most likely report to ED with continued chest pain.  This was performed on 5/7 and showed a mildly reduced EF of 45% and severe 3 vessel CAD with in stent stenosis of the LAD.  The patient was admitted and placed on IV Heparin.  It was felt Coronary Bypass would be indicated and TCTS was consulted.    Hospital Course:   The patient was evaluated by Dr. Donata Clay who felt the patient would be a candidate for Coronary bypass procedure and MAZE procedure for his history of Atrial Fibrillation.  The risks and benefits of the procedure were explained to the patient and he was agreeable to proceed.  The patient remained chest pain free during hospitalization.  He was taken to the operating room on 05/18/2012.  He underwent CABG x 4 utilizing LIMA to LAD, SVG to OM1, SVG to OM2, and SVG to PDA.  He also underwent Complete MAZE procedure with Clipping of Left Atrial Appendage.  Finally, he underwent Endoscopic Saphenous vein harvest of his right leg.  The patient tolerated the procedure well and was taken to the SICU in stable condition.  He was extubated the morning after surgery.  He required transfusion of PRBCs due to expected blood loss anemia as patient was on Plavix prior to surgery.  The patient developed episode of confusion with associated nightmares overnight.  This prompted patient to refuse medical care and this has since resolved.  He was weaned off cardiac drips as blood pressure allows.  His chest tubes and pacing wires were removed without difficulty.  The patient was transferred to the step down once medically stable.  Patient has continued to progress.  He is maintaining NSR with no episodes of Atrial Fibrillation post operatively.  He has been started on Coumadin for stroke prophylaxis and his pacing wires have been removed.  He is ambulating without difficulty and is tolerating a regular diet.  He is medically stable at this time and we will plan to discharge the  patient home today.  He will follow up with Dr. Donata Clay in 3 weeks with a CXR prior to his appointment.  He will also need to follow up with [facility] Cardiology in 2-4 weeks.  Finally patient is requesting home health.  He will need to have a PT/INR drawn on Monday 05/28/2012 with results faxed to [place] [facility] at [phone]         Significant Diagnostic Studies: angiography:   Central aortic pressure: 154/73  Left ventricular pressure: 157/8/17  Angiographic Findings:  Left main: No obstructive disease  Left Anterior Descending Artery: Large caliber vessel that courses the apex. The proximal vessel has diffuse 50% stenosis. The mid vessel has a stented segment with 50% proximal stenosis followed by a focal 99% stenosis in the mid portion of the stented segment. The first diagonal branch is small in caliber with 50% ostial stenosis. The distal LAD has diffuse plaque.  Circumflex Artery: Large caliber vessel with two large obtuse marginal branches. The first OM branch has 70% focal stenosis. The second OM branch has a focal 80% stenosis.  Right Coronary Artery: Very large dominant vessel with 80% proximal stenosis. The stented segment of the proximal vessel has mild diffuse restenosis. The mid vessel has diffuse 40% stenosis.  Left Ventricular Angiogram: LVEF 45% with hypokinesis of the anterior Felber.   Treatments: surgery:   1. Coronary artery bypass grafting x4 (left internal mammary artery to  left anterior descending, saphenous vein graft to obtuse marginal  1, saphenous vein graft to obtuse marginal 2, saphenous vein graft  to posterior descending).  2. Biatrial full Maze procedure using radiofrequency ablation.  3. Endoscopic harvest of right leg greater saphenous vein.  4. Ligation of Left Atrial appendage  Disposition: Home with home health  Discharge Medications:     Medication List    STOP taking these medications       clopidogrel 75 MG tablet  Commonly known  as:  PLAVIX     isosorbide mononitrate 30 MG 24 hr tablet  Commonly known as:  IMDUR     nitroGLYCERIN 0.4 MG SL tablet  Commonly known as:  NITROSTAT      TAKE these medications       amiodarone 200 MG tablet  Commonly known as:  PACERONE  Take 1 tablet (200 mg total) by mouth 2 (two) times daily.     ANDROGEL PUMP 20.25 MG/ACT (1.62%) Gel  Generic drug:  Testosterone  Place 2 application onto the skin every morning.     aspirin EC 81 MG tablet  Take 81 mg by mouth every evening.     ezetimibe-simvastatin 10-20 MG per tablet  Commonly known as:  VYTORIN  Take 1 tablet by mouth at bedtime.     ferrous fumarate-b12-vitamic C-folic acid capsule  Commonly known as:  TRINSICON / FOLTRIN  Take 1 capsule by mouth 3 (three) times daily after meals.     furosemide 40 MG tablet  Commonly known as:  LASIX  Take 1 tablet (40 mg total) by mouth daily. For 7  Days     metoprolol tartrate 25 MG tablet  Commonly known as:  LOPRESSOR  Take 0.5 tablets (12.5 mg total) by mouth 2 (two) times daily.     pantoprazole 40 MG tablet  Commonly known as:  PROTONIX  Take 40 mg by mouth daily.     polyethylene glycol packet  Commonly known as:  MIRALAX / GLYCOLAX  Take 17 g by mouth daily as needed (for constipation).     potassium chloride SA 20 MEQ tablet  Commonly known as:  K-DUR,KLOR-CON  Take 2 tablets (40 mEq total) by mouth 2 (two) times daily. For 7 Days     traMADol 50 MG tablet  Commonly known as:  ULTRAM  Take 1 tablet (50 mg total) by mouth every 6 (six) hours as needed.     warfarin 2.5 MG tablet  Commonly known as:  COUMADIN  Take 1 tablet (2.5 mg total) by mouth daily at 6 PM.         The patient has been discharged on:   1.Beta Blocker:  Yes [ x  ]                              No   [   ]                              If No, reason:  2.Ace Inhibitor/ARB: Yes [   ]                                     No  [    ]                                     If No,  reason: Labile Blood pressure  3.Statin:   Yes [x   ]                  No  [   ]                  If No, reason:  4.Ecasa:  Yes  [  x ]                  No   [   ]                  If No, reason:   Signed: BARRETT, ERIN 05/23/2012, 8:49 AM

## 2012-05-23 NOTE — Progress Notes (Signed)
Discussed with pt and wife ed. Requests his name be sent to [facility]. Requests HHRN and BSC. St he has RW from family member.  Ethelda Chick CES, [place] [phone] 10:12 AM 05/23/2012

## 2012-05-23 NOTE — Progress Notes (Signed)
5 Days Post-Op Procedure(s) (LRB): CORONARY ARTERY BYPASS GRAFTING (CABG) (N/A) INTRAOPERATIVE TRANSESOPHAGEAL ECHOCARDIOGRAM (N/A) MAZE (N/A) CLIPPING OF ATRIAL APPENDAGE (Left) Subjective:  Mr. Hanover complains of not sleeping well.  He also complains of cough with no sputum production, which he attributes to soda going down the "wrong hole"  He is ambulating without difficulty.  He is requesting home health at discharge  Objective: Vital signs in last 24 hours: Temp:  [97.8 F (36.6 C)-98.6 F (37 C)] 97.8 F (36.6 C) (05/14 0535) Pulse Rate:  [66-73] 72 (05/14 0535) Cardiac Rhythm:  [-] Normal sinus rhythm;Heart block (05/13 2015) Resp:  [18] 18 (05/14 0615) BP: (92-126)/(54-67) 105/64 mmHg (05/14 0535) SpO2:  [94 %-98 %] 96 % (05/14 0535) Weight:  [175 lb 7.8 oz (79.6 kg)] 175 lb 7.8 oz (79.6 kg) (05/14 0535)  Intake/Output from previous day: 05/13 0701 - 05/14 0700 In: 840 [P.O.:840] Out: 1300 [Urine:1300]  General appearance: alert, cooperative and no distress Neurologic: intact Heart: regular rate and rhythm Lungs: clear to auscultation bilaterally Abdomen: soft, non-tender; bowel sounds normal; no masses,  no organomegaly Extremities: edema trace Wound: clean and dry  Lab Results:  Recent Labs  05/22/12 0500 05/23/12 0525  WBC 8.7 10.3  HGB 8.6* 8.8*  HCT 24.9* 26.1*  PLT 137* 179   BMET:  Recent Labs  05/22/12 0500 05/23/12 0525  NA 138 138  K 3.3* 3.8  CL 102 102  CO2 27 28  GLUCOSE 115* 96  BUN 23 21  CREATININE 1.09 1.08  CALCIUM 8.5 8.7    PT/INR:  Recent Labs  05/23/12 0525  LABPROT 15.1  INR 1.21   ABG    Component Value Date/Time   PHART 7.372 05/19/2012 0533   HCO3 21.9 05/19/2012 0533   TCO2 24 05/19/2012 1649   ACIDBASEDEF 3.0* 05/19/2012 0533   O2SAT 60.6 05/19/2012 1640   CBG (last 3)   Recent Labs  05/21/12 1633 05/21/12 2111 05/22/12 0615  GLUCAP 111* 94 119*    Assessment/Plan: S/P Procedure(s) (LRB): CORONARY  ARTERY BYPASS GRAFTING (CABG) (N/A) INTRAOPERATIVE TRANSESOPHAGEAL ECHOCARDIOGRAM (N/A) MAZE (N/A) CLIPPING OF ATRIAL APPENDAGE (Left)  1. CV- NSR rate and pressure controlled continue Lopressor 2. INR 1.21- on Coumadin at 2.5 mg daily 3. Pulm- off oxygen, dry cough 4. Anemia- Hgb 8.8 stable 5. Dispo- patient stable, states was told he would be discharged today.   LOS: 7 days    Lowella Dandy 05/23/2012

## 2012-05-23 NOTE — Progress Notes (Signed)
Pt's dentures found in room by housekeeping. Pt's wife called, will come pick up this evening.

## 2012-05-23 NOTE — Progress Notes (Signed)
Physical Therapy Treatment Patient Details Name: [name] MRN: [id number] DOB: 1932-07-23 Today's Date: 05/23/2012 Time: 4098-1191 PT Time Calculation (min): 24 min  PT Assessment / Plan / Recommendation Comments on Treatment Session  Pt continues to demonstrate steady progress towards PT goals. Ambulates safely and able to perform stair negotiation.  Spoke with patient and spouse at length regarding expecations for mobility as discharge as well as techniques for safe car transfers while complying with sternal precautions.  Rec continued use of rw initially. No further acute needs as patient is leaving for home. Rec HHPT.    Follow Up Recommendations  Home health PT;Supervision/Assistance - 24 hour     Does the patient have the potential to tolerate intense rehabilitation     Barriers to Discharge        Equipment Recommendations  Rolling walker with 5" wheels;Other (comment) (3N1)    Recommendations for Other Services    Frequency Min 3X/week   Plan Discharge plan remains appropriate    Precautions / Restrictions Precautions Precautions: Fall;Sternal Restrictions Weight Bearing Restrictions: No   Pertinent Vitals/Pain No pain at this time    Mobility  Bed Mobility Bed Mobility: Rolling Left;Left Sidelying to Sit Rolling Left: 4: Min assist Left Sidelying to Sit: 3: Mod assist;HOB flat Details for Bed Mobility Assistance: Pt cued to hold pillow.  Needed assist for elevation of trunk. Transfers Transfers: Sit to Stand;Stand to Sit Sit to Stand: 5: Supervision Stand to Sit: 5: Supervision Details for Transfer Assistance: VCs for handplacement YN:[id number] precautions Ambulation/Gait Ambulation/Gait Assistance: 5: Supervision Ambulation Distance (Feet): 300 Feet Assistive device: Rolling walker Ambulation/Gait Assistance Details: Pt steady with ambulation Gait Pattern: Step-through pattern;Decreased stride length;Decreased step length - left;Decreased step length -  right;Trunk flexed;Shuffle Gait velocity: decreased Stairs: Yes Stairs Assistance: 5: Supervision Stair Management Technique: One rail Right;Step to pattern;Forwards Number of Stairs: 5 Wheelchair Mobility Wheelchair Mobility: No      PT Goals Acute Rehab PT Goals PT Goal Formulation: With patient Time For Goal Achievement: 06/04/12 Potential to Achieve Goals: Good Pt will go Supine/Side to Sit: Independently PT Goal: Supine/Side to Sit - Progress: Progressing toward goal Pt will go Sit to Stand: Independently PT Goal: Sit to Stand - Progress: Progressing toward goal Pt will Ambulate: 51 - 150 feet;with modified independence;with least restrictive assistive device PT Goal: Ambulate - Progress: Progressing toward goal Pt will Go Up / Down Stairs: 3-5 stairs;with modified independence;with least restrictive assistive device PT Goal: Up/Down Stairs - Progress: Progressing toward goal  Visit Information  Last PT Received On: 05/23/12 Assistance Needed: +1    Subjective Data  Subjective: I feel like i've gained weight Patient Stated Goal: to go home   Cognition  Cognition Arousal/Alertness: Awake/alert Behavior During Therapy: WFL for tasks assessed/performed Overall Cognitive Status: Within Functional Limits for tasks assessed    Balance  Static Standing Balance Static Standing - Balance Support: During functional activity;Bilateral upper extremity supported Static Standing - Level of Assistance: 6: Modified independent (Device/Increase time) Static Standing - Comment/# of Minutes: 2 minutes  End of Session PT - End of Session Equipment Utilized During Treatment: Gait belt Activity Tolerance: Patient tolerated treatment well Patient left: in chair;with call bell/phone within reach;with family/visitor present Nurse Communication: Mobility status; Nsg providing discharge instructions.   GP     Fabio Asa 05/23/2012, 12:11 PM

## 2012-05-23 NOTE — Progress Notes (Signed)
Pt and wife educated and informed of DC information. Verbalizes understanding of medications and f/u appts. IV and monitor removed, CTS removed. Ready for DC with wife.

## 2012-05-24 ENCOUNTER — Encounter (HOSPITAL_COMMUNITY): Payer: Self-pay | Admitting: Cardiothoracic Surgery

## 2012-05-25 ENCOUNTER — Telehealth: Payer: Self-pay

## 2012-05-25 NOTE — Telephone Encounter (Signed)
Pt was d/c on metoprolol 25 mg tabs take 1/2 tablet BID and amiodarone 200 mg BID D/c papers read "amiodarone 1 tablet (200 mg total) by mouth twice daily". She asks, because it reads ":total", should he get a tottal of 200 mg daily or a total of 400 mg daily. I explained the "total" indicated total amount of mg to be taking per dose. I explained he shopuld be taking 200 mg BID for a total of 400 mg daily. She had same question re: metoprolol. I explained he should be getting a total of 25 mg daily or 12.5 mg BID. Understanding verb  Wife states, "I a pretty perturbed about having to go to [facility] 4x since January and being sent home each time with a diagnosis of indigestion.  I want to see actual pictures of each stress test that was performed so I can understand why this was not caught before now"   I made her an appt with Dr. Mariah Milling to discuss further 6/6.  Seeing CVTS 6/4.

## 2012-05-25 NOTE — Telephone Encounter (Signed)
Pt wife called and has a question about medication that pt got when he had heart surgery, has question about metroprolol,. Please cal.

## 2012-05-29 ENCOUNTER — Telehealth: Payer: Self-pay

## 2012-05-29 ENCOUNTER — Ambulatory Visit: Payer: Medicare Other | Admitting: Physician Assistant

## 2012-05-29 NOTE — Telephone Encounter (Signed)
MELISSA,  Dr. Zenaida Niece Tright's office would like for you to call them to discuss [name] today.  [phone] They received a phone call from him last night concerning issues which included black stools.

## 2012-05-29 NOTE — Telephone Encounter (Signed)
I spoke with wife and offered appt with coumadin clinic tomm  Wife is hesitant to bring pt out since he is s/p CABG recently I had her hold while I tried calling coumadin nurse in [place]  No answer in coumadin clinic  I advised wife ok to have pt hold coumadin tonight as well since this should bring his level to 2.3 and also since he is c/o dark stools In mean time, I will continue to try to get in touch with coumadin clinic to get further advice/details I will also call [facility] nurse to have them check INR again for [place] tomm/Thursday  I will call wife back once I have spoken with either Understanding verb

## 2012-05-29 NOTE — Telephone Encounter (Signed)
Wife concerned b/c pt is experiencing severe constipation since surg Says his stools are very hard and small I explained this can be normal post CABG, but wife says she has tried OTC stool softeners as well as RX she was given at his d/c I told her I would make dr. Dineen Kid aware of complaints and see if there are any other alternatives. Understanding verb

## 2012-05-29 NOTE — Telephone Encounter (Signed)
I spoke with Dr. Sheffield Slider nurse who says she has spoken with pt's wife and she is concerned about the high INUR and dark stools Says pt taking Fe supplements Understands pt was told to hold warfarin last night but would like further instructions as to what to do now Asks if pt can be seen by coumadin clinic here tomm I told Nurse I would call pt's wife and speak with her in detail

## 2012-05-29 NOTE — Telephone Encounter (Signed)
I received t/c from [facility] with [facility] They checked INR yesterday and it was 4.3 Pt c/o black stools, per Neysa Bonito, so she advised pt to hold warfarin last night and await further instructions from [place] I will make coumadin RN aware

## 2012-05-29 NOTE — Telephone Encounter (Signed)
I discussed with Dr. Mariah Milling who reviewed d/c summary from surgeons. This suggests INR will be managed by Coumadin clinic.  He asks that I make them aware

## 2012-05-29 NOTE — Telephone Encounter (Signed)
I spoke with Susy Manor, RN who says ok for pt hold warfarin again tonight. Should resume Wednesday. We will have pt change coumadin dose to 2.5 mg daily except take 1.25 mg M,W,F. He should have INR checked again Friday 5/23 and results called to Coumadin clinic in [place].  I was able to speak with Wilkie Aye, nurse with [facility] about plan.  She verb understanding. I also gave her # for [place] coumadin clinic.  She will call results to them Friday 5/23.  I will call wife with new instructions as well.

## 2012-05-29 NOTE — Telephone Encounter (Signed)
I called [facility] and left message for pt's nurse to call me back

## 2012-05-29 NOTE — Telephone Encounter (Signed)
Wife informed Understanding verb  will await results Friday

## 2012-05-31 ENCOUNTER — Encounter: Payer: Self-pay | Admitting: Cardiothoracic Surgery

## 2012-05-31 ENCOUNTER — Telehealth: Payer: Self-pay | Admitting: *Deleted

## 2012-05-31 ENCOUNTER — Telehealth: Payer: Self-pay | Admitting: Internal Medicine

## 2012-05-31 LAB — LAB REPORT - SCANNED: INR: 3.1

## 2012-05-31 NOTE — Telephone Encounter (Signed)
Tell her to get a bottle of citrate of magnesium at he drugstore- non prescription- and give him half a bottle for 2 days  PVT

## 2012-05-31 NOTE — Discharge Summary (Signed)
patient examined and medical record reviewed,agree with above note. VAN TRIGT III,[place] V[place]annon 05/31/2012    

## 2012-05-31 NOTE — Telephone Encounter (Signed)
Spoke with pt's wife, pt s/p CABG 05/18/12, improving daily. Pt has HH nurse and has followup visits with cardiologist and surgeon in next 2 weeks. Pt has lost some weight, has had decreased appetite with Amiodarone, but pt eating frequently, high protein meals, and increasing his activity/exercise with [facility] nurse. Advised to call back with any concerns or problems.

## 2012-05-31 NOTE — Telephone Encounter (Signed)
Dr Dan Humphreys   Mr Anola Gurney appointment 06/08/12 @ 2:45

## 2012-06-01 ENCOUNTER — Telehealth: Payer: Self-pay

## 2012-06-01 ENCOUNTER — Other Ambulatory Visit: Payer: Self-pay | Admitting: Cardiovascular Disease

## 2012-06-01 ENCOUNTER — Ambulatory Visit (INDEPENDENT_AMBULATORY_CARE_PROVIDER_SITE_OTHER): Payer: Medicare Other | Admitting: Cardiovascular Disease

## 2012-06-01 DIAGNOSIS — I4891 Unspecified atrial fibrillation: Secondary | ICD-10-CM

## 2012-06-01 DIAGNOSIS — Z9889 Other specified postprocedural states: Secondary | ICD-10-CM

## 2012-06-01 DIAGNOSIS — Z951 Presence of aortocoronary bypass graft: Secondary | ICD-10-CM

## 2012-06-01 LAB — POCT INR: INR: 4.1

## 2012-06-01 NOTE — Telephone Encounter (Signed)
Neysa Bonito, [facility] nurse, called back to let me know INR=4.1 I spoke with Tiffany in Coumadin clinic who advised to have pt take 1/2 tablet (1.25 mg) daily except take 1 tablet (2.5 mg) on Mondays and Thursdays and recheck on Tuesday 5/27 when here with Dr. Mariah Milling. I will call wife to inform.  Wife informed Understanding verb

## 2012-06-01 NOTE — Telephone Encounter (Signed)
[name], [facility] nurse, was also informed of Dr. Windell Hummingbird plan. She verb understanding and understands I will call wife as well. She is also going to back to pt's home ASAP to get repeat INR since lab at [facility] was unable to process. She will use anticoag machine instead so we can get instant results. She will call results to coumadin clinic.

## 2012-06-01 NOTE — Telephone Encounter (Signed)
See below

## 2012-06-01 NOTE — Telephone Encounter (Signed)
I rec'd t/c from [facility] lab saying they received labs from [facility] on pt but 1 tube has clotted and the other tube is short some blood Would need to draw this again I called Neysa Bonito, [facility] nurse to inform her of this at [phone] and had to [facility] In mean time, I will discuss with Dr. Kirke Corin to see if STAT CBC still necessary

## 2012-06-01 NOTE — Telephone Encounter (Signed)
Wife called back. Says she was informed by Dr. Sheffield Slider office to try mag citrate per his instruction She has been trying this along with other OTC remedies. Having some improvement BP=100/56, HR="much better" Walking x 15 mins BID Afebrile Will continue medications as instructed at d/c and will call us should she have any concerns prior to appt 6/6 [facility] RN coming to home today to check INR and will call results to Coumadin Clinic

## 2012-06-01 NOTE — Telephone Encounter (Signed)
LMTCB re: Dr. Sheffield Slider response

## 2012-06-01 NOTE — Telephone Encounter (Signed)
I discussed with Dr. Mariah Milling who says "Ok to not get repeat CBC. Have pt hold atenolol until he sees [place] 5/27. Push fuids. If he begins to feel worse over w/e he should go to [facility] ER." V.O Dr. Alvis Lemmings, RN  I will call to inform pt/wife

## 2012-06-01 NOTE — Telephone Encounter (Signed)
I received t/c from [place], [facility] nurse, who says she is at pt's home for INR and assessment Says pt is having some symptoms she "is uncomfortable with" Says BP=90/50, HR=76 Says he is dizzy upon standing and weak Confirms he/wife is pushing PO fluids  He has taken medications as prescribed this am (metoprolol 12.5 mg BID) Says stools continue to be dark/tarry (as they were 2 days ago) on coumadin and iron replacement Would like pt to be seen sooner than 6/6 as scheduled I scheduled him for 5/27 at 3 pm She will call coumadin clinic back with INR results and they will dose as appropriate She is also getting a stat CBC while there We will await these results  I will discuss with Dr. Kirke Corin as well, MD in office, and call her back with further instructions Will also make Dr. Alla German aware

## 2012-06-01 NOTE — Telephone Encounter (Signed)
Wife informed to hold atenolol and push fluids over w/e Understands to call 911/go to [facility] ER if symptoms get worse She also was informed Neysa Bonito will be back out to pt's home today to check INR

## 2012-06-05 ENCOUNTER — Encounter: Payer: Self-pay | Admitting: Cardiovascular Disease

## 2012-06-05 ENCOUNTER — Ambulatory Visit (INDEPENDENT_AMBULATORY_CARE_PROVIDER_SITE_OTHER): Payer: Medicare Other | Admitting: Cardiovascular Disease

## 2012-06-05 ENCOUNTER — Ambulatory Visit (INDEPENDENT_AMBULATORY_CARE_PROVIDER_SITE_OTHER): Payer: Medicare Other

## 2012-06-05 VITALS — BP 120/72 | HR 68 | Ht 73.0 in | Wt 164.5 lb

## 2012-06-05 DIAGNOSIS — R195 Other fecal abnormalities: Secondary | ICD-10-CM

## 2012-06-05 DIAGNOSIS — I4891 Unspecified atrial fibrillation: Secondary | ICD-10-CM

## 2012-06-05 DIAGNOSIS — I2510 Atherosclerotic heart disease of native coronary artery without angina pectoris: Secondary | ICD-10-CM

## 2012-06-05 DIAGNOSIS — Z951 Presence of aortocoronary bypass graft: Secondary | ICD-10-CM

## 2012-06-05 DIAGNOSIS — Z9889 Other specified postprocedural states: Secondary | ICD-10-CM

## 2012-06-05 DIAGNOSIS — Z8679 Personal history of other diseases of the circulatory system: Secondary | ICD-10-CM

## 2012-06-05 DIAGNOSIS — E785 Hyperlipidemia, unspecified: Secondary | ICD-10-CM

## 2012-06-05 DIAGNOSIS — R0602 Shortness of breath: Secondary | ICD-10-CM

## 2012-06-05 LAB — POCT INR: INR: 5.2

## 2012-06-05 MED ORDER — LACTULOSE 10 GM/15ML PO SOLN: 20.0000 g | Freq: Three times a day (TID) | ORAL | Status: DC | PRN

## 2012-06-05 NOTE — Assessment & Plan Note (Addendum)
Doing well following surgery. Denies any edema, tachycardia or palpitations. We did mention that he could probably drop his amiodarone dose down to 200 mg daily. He would like to discuss this with Dr. Alla German next week. Also like to discuss the Maze procedure and learn what was done. Stress test images from January 2014 at [facility] and cardiac catheterization images were shown to he and his wife today. I suspect that he had balance ischemia in January 2014. In-stent restenosis was likely more acute. Cardiac we have ordered at [facility].

## 2012-06-05 NOTE — Assessment & Plan Note (Signed)
Maintaining normal sinus rhythm today. On warfarin

## 2012-06-05 NOTE — Assessment & Plan Note (Signed)
Goal total cholesterol less than 150, LDL less than 70. Continue Vytorin

## 2012-06-05 NOTE — Telephone Encounter (Signed)
Spoke with patient today, called to see how he was doing. He would ike to cancel his appointment, since he has to see Dr. Mariah Milling this week. Didn't understand why he needed to be seen here so soon. Stated he is progressing well, moving a little slow but has gotten his appetite back so he has put a little of his weight back on. Cancelled appointment for 5/30, offered to reschedule it for him, he declined and stated he would call back to reschedule.

## 2012-06-05 NOTE — Progress Notes (Signed)
Patient ID: [name], male    DOB: 05/12/1932, 79 y.o.   MRN: [id number]  HPI Comments: [name] is a very pleasant 79 year old gentleman with a history of coronary artery disease, stent placed to his LAD in 1993, stent placed to his mid RCA in 2003, in-stent restenosis with repeat stent placed to his LAD in September 08, stent placed to his RCA at the same time, stress test in February 2009 and Jan 2014 which showed no ischemia, also with history of atrial fibrillation, maintained on low dose amiodarone and  presents for routine followup. history of low testosterone . He presents after CABG  Recurrent episode of chest pain at the beginning of may 2014. Presented to [facility], cardiac catheterization showing subtotal occlusion of his LAD stent, in-stent restenosis, severe OM1 and OM 2 disease also with RCA disease,  On  05/18/2012.  He underwent CABG x 4 utilizing LIMA to LAD, SVG to OM1, SVG to OM2, and SVG to PDA.  He also underwent Complete MAZE procedure with Clipping of Left Atrial Appendage.   He has been recovering relatively well. He had a difficult time with lightheadedness, hypotension at home. Numerous phone calls and he weaned himself off his beta blockers and most of his other medications. He continues on amiodarone 200 mg twice a day, Vytorin 10/20 mg daily. He and his wife do not think he can tolerate ACE inhibitor or beta blocker. He denies any tachycardia or palpitations concerning for atrial fibrillation. He does not like taking medications in general.  In  January 2014 he  had an episode of atrial fibrillation and presented to the emergency room. Converted back to normal sinus rhythm. Discharged on pradaxa. Recurrent episodes of atrial fibrillation shortly after that.   previous  total cholesterol 150, LDL 83.  EKG shows sinus rhythm with rate 68 beats per minute, T-wave abnormality diffusely   Outpatient Encounter Prescriptions as of 06/05/2012  Medication Sig Dispense Refill   . amiodarone (PACERONE) 200 MG tablet Take 1 tablet (200 mg total) by mouth 2 (two) times daily.  60 tablet  3  . ANDROGEL PUMP 20.25 MG/ACT (1.62%) GEL Place 2 application onto the skin every morning.       [name] aspirin EC 81 MG tablet Take 81 mg by mouth every evening.      . ezetimibe-simvastatin (VYTORIN) 10-20 MG per tablet Take 1 tablet by mouth at bedtime.  90 tablet  3  . ferrous fumarate-b12-vitamic C-folic acid (TRINSICON / FOLTRIN) capsule Take 1 capsule by mouth 3 (three) times daily after meals.  90 capsule  3  . polyethylene glycol (MIRALAX / GLYCOLAX) packet Take 17 g by mouth daily as needed (for constipation).      . warfarin (COUMADIN) 2.5 MG tablet Take 1 tablet (2.5 mg total) by mouth daily at 6 PM.  30 tablet  3  . lactulose (CHRONULAC) 10 GM/15ML solution Take 30 mLs (20 g total) by mouth 3 (three) times daily as needed.  240 mL  3  . [DISCONTINUED] furosemide (LASIX) 40 MG tablet Take 1 tablet (40 mg total) by mouth daily. For 7 Days  7 tablet  0  . [DISCONTINUED] metoprolol tartrate (LOPRESSOR) 25 MG tablet Take 0.5 tablets (12.5 mg total) by mouth 2 (two) times daily.  60 tablet  3  . [DISCONTINUED] pantoprazole (PROTONIX) 40 MG tablet Take 40 mg by mouth daily.      . [DISCONTINUED] potassium chloride SA (K-DUR,KLOR-CON) 20 MEQ tablet Take 2 tablets (  40 mEq total) by mouth 2 (two) times daily. For 7 Days  14 tablet  0  . [DISCONTINUED] traMADol (ULTRAM) 50 MG tablet Take 1 tablet (50 mg total) by mouth every 6 (six) hours as needed.  30 tablet  0   No facility-administered encounter medications on file as of 06/05/2012.    Review of Systems  Constitutional: Positive for fatigue.  HENT: Negative.   Eyes: Negative.   Respiratory: Positive for shortness of breath.   Cardiovascular: Negative.   Gastrointestinal: Negative.   Musculoskeletal: Negative.   Skin: Negative.   Neurological: Positive for weakness.  Psychiatric/Behavioral: Negative.   All other systems reviewed  and are negative.   BP 120/72  Pulse 68  Ht 6\' 1"  (1.854 m)  Wt 164 lb 8 oz (74.617 kg)  BMI 21.71 kg/m2  Physical Exam  Nursing note and vitals reviewed. Constitutional: He is oriented to person, place, and time. He appears well-developed and well-nourished.  HENT:  Head: Normocephalic.  Nose: Nose normal.  Mouth/Throat: Oropharynx is clear and moist.  Eyes: Conjunctivae are normal. Pupils are equal, round, and reactive to light.  Neck: Normal range of motion. Neck supple. No JVD present.  Cardiovascular: Normal rate, regular rhythm, S1 normal, S2 normal, normal heart sounds and intact distal pulses.  Exam reveals no gallop and no friction rub.   No murmur heard. Well healed mediastinal scar  Pulmonary/Chest: Effort normal and breath sounds normal. No respiratory distress. He has no wheezes. He has no rales. He exhibits no tenderness.  Abdominal: Soft. Bowel sounds are normal. He exhibits no distension. There is no tenderness.  Musculoskeletal: Normal range of motion. He exhibits no edema and no tenderness.  Lymphadenopathy:    He has no cervical adenopathy.  Neurological: He is alert and oriented to person, place, and time. Coordination normal.  Skin: Skin is warm and dry. No rash noted. No erythema.  Psychiatric: He has a normal mood and affect. His behavior is normal. Judgment and thought content normal.      Assessment and Plan

## 2012-06-05 NOTE — Patient Instructions (Addendum)
You are doing well. Try lactulose for constipation  Talk with Dr. Alla German about decreasing the dose of amiodarone   We will order cardiac rehab , s/p CABG  You can try zyrtec (cetirazine)  Please call us if you have new issues that need to be addressed before your next appt.  Your physician wants you to follow-up in: 3 months.  You will receive a reminder letter in the mail two months in advance. If you don't receive a letter, please call our office to schedule the follow-up appointment.

## 2012-06-05 NOTE — Assessment & Plan Note (Signed)
No symptoms concerning for atrial fibrillation. He can likely decrease the dose back to amiodarone 200 mg daily. We have discussed this with him and he will bring it up on his visit to CT surgery. INR today 5.3. He we'll hold his warfarin, recheck INR in several days' time

## 2012-06-06 ENCOUNTER — Telehealth: Payer: Self-pay

## 2012-06-06 NOTE — Telephone Encounter (Signed)
I called to inform [name] [facility] nurse, to check INR and CBC tomm when she goes to visit pt She verb understanding and will fax [place] results

## 2012-06-07 ENCOUNTER — Telehealth: Payer: Self-pay

## 2012-06-07 NOTE — Telephone Encounter (Signed)
[name] drew INR and CBC together this am Dropped blood off to [facility] lab Says we should have results by end of day I will await results  She says wife asks if pt should continue ASA since INR was high 5/27 I advised he continue on this since he is s/p CABG and Maze procedure Understanding verb Will await results

## 2012-06-07 NOTE — Telephone Encounter (Signed)
Neysa Bonito asks if pt needs to still take aspirin, due to elevated INR. Please advise.

## 2012-06-08 ENCOUNTER — Ambulatory Visit (INDEPENDENT_AMBULATORY_CARE_PROVIDER_SITE_OTHER): Payer: Medicare Other

## 2012-06-08 ENCOUNTER — Ambulatory Visit: Payer: Medicare Other | Admitting: Internal Medicine

## 2012-06-08 DIAGNOSIS — Z9889 Other specified postprocedural states: Secondary | ICD-10-CM

## 2012-06-08 DIAGNOSIS — Z951 Presence of aortocoronary bypass graft: Secondary | ICD-10-CM

## 2012-06-08 DIAGNOSIS — I4891 Unspecified atrial fibrillation: Secondary | ICD-10-CM

## 2012-06-08 LAB — POCT INR: INR: 2.97

## 2012-06-08 NOTE — Telephone Encounter (Signed)
Wife informed Understanding verb Has appt with Dr. Zenaida Niece Tright's office 06/13/12.  Will see if they can check INR at that time

## 2012-06-08 NOTE — Telephone Encounter (Signed)
Labs rec'd Hgb=10.5 Inr=2.97 Will discuss with Dr. Mariah Milling

## 2012-06-08 NOTE — Telephone Encounter (Signed)
Have not received labs. I requested these from [facility] lab and they are faxing now

## 2012-06-08 NOTE — Telephone Encounter (Signed)
I reviewed results with Dr. Mariah Milling. He gave orders to "Continue iron supplement. Change warfarin dose to 1.25 mg daily and recheck next Wednesday 06/13/12". V.O. Dr. Alvis Lemmings, RN, BSN

## 2012-06-08 NOTE — Telephone Encounter (Signed)
Please see below and let me know if this cannot be done there. Thanks!

## 2012-06-11 ENCOUNTER — Other Ambulatory Visit: Payer: Self-pay | Admitting: *Deleted

## 2012-06-11 ENCOUNTER — Telehealth: Payer: Self-pay

## 2012-06-11 DIAGNOSIS — I2510 Atherosclerotic heart disease of native coronary artery without angina pectoris: Secondary | ICD-10-CM

## 2012-06-11 NOTE — Telephone Encounter (Signed)
Please advise when INR should be checked

## 2012-06-11 NOTE — Telephone Encounter (Signed)
[name], [facility] nurse, asks when INR needs to be checked again I ex[plained pt to have this checked at appt with Dr. Alla German 6/4 Understanding verb

## 2012-06-13 ENCOUNTER — Ambulatory Visit (INDEPENDENT_AMBULATORY_CARE_PROVIDER_SITE_OTHER): Payer: Self-pay | Admitting: Cardiothoracic Surgery

## 2012-06-13 ENCOUNTER — Telehealth: Payer: Self-pay

## 2012-06-13 ENCOUNTER — Other Ambulatory Visit: Payer: Self-pay

## 2012-06-13 ENCOUNTER — Ambulatory Visit
Admission: RE | Admit: 2012-06-13 | Discharge: 2012-06-13 | Disposition: A | Discharge status: Home / Self Care | Payer: Medicare Other | Source: Ambulatory Visit | Attending: Cardiothoracic Surgery | Admitting: Cardiothoracic Surgery

## 2012-06-13 ENCOUNTER — Encounter: Payer: Self-pay | Admitting: Cardiothoracic Surgery

## 2012-06-13 VITALS — BP 120/71 | HR 66 | Resp 18 | Ht 73.0 in | Wt 164.0 lb

## 2012-06-13 DIAGNOSIS — I2510 Atherosclerotic heart disease of native coronary artery without angina pectoris: Secondary | ICD-10-CM

## 2012-06-13 DIAGNOSIS — Z9889 Other specified postprocedural states: Secondary | ICD-10-CM

## 2012-06-13 DIAGNOSIS — Z951 Presence of aortocoronary bypass graft: Secondary | ICD-10-CM

## 2012-06-13 NOTE — Telephone Encounter (Signed)
Per pt' coumadin was d/c'd today by Dr. Alla German as was his ferous sulfate I will update records

## 2012-06-13 NOTE — Progress Notes (Signed)
PCP is Wynona Dove, MD Referring Provider is Wynona Dove*  Chief Complaint  Patient presents with  . Routine Post Op    F/U from surgery with CXR, S/P CABG x 4 and MAZE    HPI: Routine followup for CABG x4 with combined bi-atrial maze procedure and left atrial clip. Maintaining sinus rhythm. Complains of dizziness, poor appetite, weight loss. He is walking 40 minutes twice a day with his wife. No difficulty with the surgical incisions. No symptoms of angina or CHF. He has lost approximately 9 pounds from his preop weight.  He is stop his beta blocker, his ACE inhibitor do to dizziness. He complains of black stools from the iron. His INR has been difficult to control and a 1 point was greater than 5.2 I feel it is safe to stop his Coumadin now one month postop and will reduce amiodarone to 1 tablet daily   Past Medical History  Diagnosis Date  . Coronary artery disease     a. 1993 s/p PCI/BMS LAD;  b. 2003 PCI RCA;  c. 09/2006 ISR LAD->PCI, PCI RCA;  d. 02/2007 MV: no ischemia;  d. 01/2012 MV: EF 74%, no ischemia.  . Hyperlipidemia   . Hypertension   . Hyperthyroidism   . PAF (paroxysmal atrial fibrillation)     a. previously on amio ->stopped in 2013;  b. recurrent PAF 01/2012->Pradaxa and amio initiated, subsequently came off of pradaxa 2/2 cost;  c. 02/2012 Echo: EF 55-60%, Gr 1 DD.  [name] Low testosterone   . History of prostate cancer     Past Surgical History  Procedure Laterality Date  . Cardiac catheterization  2008  . Prostatectomy    . Hernia repair      s/p mesh bilaterally, Dr. Lemar Livings  . Appendectomy      Dr. Lemar Livings  . Colonoscopy  2013  . Intraoperative transesophageal echocardiogram N/A 05/18/2012    Procedure: INTRAOPERATIVE TRANSESOPHAGEAL ECHOCARDIOGRAM;  Surgeon: Kerin Perna, MD;  Location: [facility] OR;  Service: Open Heart Surgery;  Laterality: N/A;  . Maze N/A 05/18/2012    Procedure: MAZE;  Surgeon: Kerin Perna, MD;  Location: [facility] OR;  Service:  Open Heart Surgery;  Laterality: N/A;  . Clipping of atrial appendage Left 05/18/2012    Procedure: CLIPPING OF ATRIAL APPENDAGE;  Surgeon: Kerin Perna, MD;  Location: [facility] OR;  Service: Open Heart Surgery;  Laterality: Left;  . Coronary artery bypass graft N/A 05/18/2012    Procedure: CORONARY ARTERY BYPASS GRAFTING (CABG);  Surgeon: Kerin Perna, MD;  Location: [facility] OR;  Service: Open Heart Surgery;  Laterality: N/A;  Times 4 using left internal mammary artery and endoscopically harvested right saphenous vein    Family History  Problem Relation Age of Onset  . Coronary artery disease Mother   . Heart disease Mother   . Coronary artery disease Sister   . Diabetes Sister   . Heart disease Sister 52    deceased  . Diabetes Father   . Heart disease Brother     Social History History  Substance Use Topics  . Smoking status: Former Smoker    Types: Cigars    Quit date: 10/04/1988  . Smokeless tobacco: Former Neurosurgeon    Types: Chew    Quit date: 09/04/1991  . Alcohol Use: 0.5 oz/week    1 drink(s) per week     Comment: occassionaly    Current Outpatient Prescriptions  Medication Sig Dispense Refill  . amiodarone (PACERONE) 200 MG tablet  Take 1 tablet (200 mg total) by mouth 2 (two) times daily.  60 tablet  3  . ANDROGEL PUMP 20.25 MG/ACT (1.62%) GEL Place 2 application onto the skin every morning.       [name] aspirin EC 81 MG tablet Take 81 mg by mouth every evening.      . ezetimibe-simvastatin (VYTORIN) 10-20 MG per tablet Take 1 tablet by mouth at bedtime.  90 tablet  3  . ferrous fumarate-b12-vitamic C-folic acid (TRINSICON / FOLTRIN) capsule Take 1 capsule by mouth 3 (three) times daily after meals.  90 capsule  3  . polyethylene glycol (MIRALAX / GLYCOLAX) packet Take 17 g by mouth daily as needed (for constipation).      . warfarin (COUMADIN) 2.5 MG tablet Take 1 tablet (2.5 mg total) by mouth daily at 6 PM.  30 tablet  3   No current facility-administered medications for this  visit.    Allergies  Allergen Reactions  . Ambien (Zolpidem Tartrate) Other (See Comments)    Super sensitive.   . Statins     REACTION: muscle aches...crestor  . Zolpidem Tartrate     Review of Systems no fever no edema no angina-- overall stronger  BP 120/71  Pulse 66  Resp 18  Ht 6\' 1"  (1.854 m)  Wt 164 lb (74.39 kg)  BMI 21.64 kg/m2  SpO2 98% Physical Exam Alert and comfortable Lungs clear Sternal and leg incision is well-healed Cardiac rhythm regular murmur No pedal edema  Diagnostic Tests: Chest x-ray clear with mild atelectasis left costophrenic angle  Impression: Doing well postop CABG with combined maze procedure Plan: Patient recommended to start outpatient cardiac rehabilitation at [facility]. Patient can drive and lift up to 15 pounds. Patient will stop Coumadin, stop INR draw and reduce amiodarone to 200 mg by mouth daily We'll review patient in 4-5 weeks with chest x-ray.

## 2012-06-13 NOTE — Telephone Encounter (Signed)
INR at Dr. Sheffield Slider office

## 2012-06-13 NOTE — Telephone Encounter (Signed)
Message copied by [facility], Aldahir Litaker E on Wed Jun 13, 2012  8:47 AM ------      Message from: Aaleyah Witherow E      Created: Mon Jun 11, 2012 12:53 PM      Regarding: INR       Dr. Alla German ------

## 2012-06-14 DIAGNOSIS — E785 Hyperlipidemia, unspecified: Secondary | ICD-10-CM

## 2012-06-14 DIAGNOSIS — I4891 Unspecified atrial fibrillation: Secondary | ICD-10-CM

## 2012-06-14 DIAGNOSIS — R0602 Shortness of breath: Secondary | ICD-10-CM

## 2012-06-14 DIAGNOSIS — Z9889 Other specified postprocedural states: Secondary | ICD-10-CM

## 2012-06-14 DIAGNOSIS — Z951 Presence of aortocoronary bypass graft: Secondary | ICD-10-CM

## 2012-06-15 ENCOUNTER — Ambulatory Visit: Payer: Medicare Other | Admitting: Cardiovascular Disease

## 2012-06-20 ENCOUNTER — Ambulatory Visit: Payer: Self-pay | Admitting: Cardiovascular Disease

## 2012-06-20 ENCOUNTER — Encounter: Payer: Self-pay | Admitting: Cardiovascular Disease

## 2012-06-20 DIAGNOSIS — I4891 Unspecified atrial fibrillation: Secondary | ICD-10-CM

## 2012-06-20 DIAGNOSIS — Z9889 Other specified postprocedural states: Secondary | ICD-10-CM

## 2012-06-20 DIAGNOSIS — Z951 Presence of aortocoronary bypass graft: Secondary | ICD-10-CM

## 2012-07-10 ENCOUNTER — Encounter: Payer: Self-pay | Admitting: Cardiovascular Disease

## 2012-07-18 ENCOUNTER — Ambulatory Visit: Payer: Medicare Other | Admitting: Cardiothoracic Surgery

## 2012-07-18 ENCOUNTER — Other Ambulatory Visit: Payer: Self-pay | Admitting: *Deleted

## 2012-07-18 DIAGNOSIS — I2510 Atherosclerotic heart disease of native coronary artery without angina pectoris: Secondary | ICD-10-CM

## 2012-07-19 ENCOUNTER — Ambulatory Visit
Admission: RE | Admit: 2012-07-19 | Discharge: 2012-07-19 | Disposition: A | Discharge status: Home / Self Care | Payer: Medicare Other | Source: Ambulatory Visit | Attending: Cardiothoracic Surgery | Admitting: Cardiothoracic Surgery

## 2012-07-19 ENCOUNTER — Ambulatory Visit (INDEPENDENT_AMBULATORY_CARE_PROVIDER_SITE_OTHER): Payer: Self-pay | Admitting: Cardiothoracic Surgery

## 2012-07-19 ENCOUNTER — Encounter: Payer: Self-pay | Admitting: Cardiothoracic Surgery

## 2012-07-19 VITALS — BP 140/79 | HR 60 | Resp 18 | Ht 73.0 in | Wt 164.0 lb

## 2012-07-19 DIAGNOSIS — I2510 Atherosclerotic heart disease of native coronary artery without angina pectoris: Secondary | ICD-10-CM

## 2012-07-19 DIAGNOSIS — Z8679 Personal history of other diseases of the circulatory system: Secondary | ICD-10-CM

## 2012-07-19 DIAGNOSIS — Z951 Presence of aortocoronary bypass graft: Secondary | ICD-10-CM

## 2012-07-19 DIAGNOSIS — Z9889 Other specified postprocedural states: Secondary | ICD-10-CM

## 2012-07-19 NOTE — Progress Notes (Signed)
PCP is Wynona Dove, MD Referring Provider is Wynona Dove*  Chief Complaint  Patient presents with  . Routine Post Op    5 week f/u with CXR    HPI: Patient returns for final 2 month followup after combined CABG and biatrial Maze procedure with left atrial appendage closure. The patient is maintaining sinus rhythm on amiodarone 200 mg daily. His appetite and overall strength is improved since cutting back on amiodarone 2 once a day. He is walking 30 minutes a day and is participating in his cardiac rehabilitation program. No oral concerns other than wishing to increase his activity and lifting limits. His Coumadin was stopped previously due to intolerance and difficulty with dangerous INR values.   Past Medical History  Diagnosis Date  . Coronary artery disease     a. 1993 s/p PCI/BMS LAD;  b. 2003 PCI RCA;  c. 09/2006 ISR LAD->PCI, PCI RCA;  d. 02/2007 MV: no ischemia;  d. 01/2012 MV: EF 74%, no ischemia.  . Hyperlipidemia   . Hypertension   . Hyperthyroidism   . PAF (paroxysmal atrial fibrillation)     a. previously on amio ->stopped in 2013;  b. recurrent PAF 01/2012->Pradaxa and amio initiated, subsequently came off of pradaxa 2/2 cost;  c. 02/2012 Echo: EF 55-60%, Gr 1 DD.  [name] Low testosterone   . History of prostate cancer     Past Surgical History  Procedure Laterality Date  . Cardiac catheterization  2008  . Prostatectomy    . Hernia repair      s/p mesh bilaterally, Dr. Lemar Livings  . Appendectomy      Dr. Lemar Livings  . Colonoscopy  2013  . Intraoperative transesophageal echocardiogram N/A 05/18/2012    Procedure: INTRAOPERATIVE TRANSESOPHAGEAL ECHOCARDIOGRAM;  Surgeon: Kerin Perna, MD;  Location: [facility] OR;  Service: Open Heart Surgery;  Laterality: N/A;  . Maze N/A 05/18/2012    Procedure: MAZE;  Surgeon: Kerin Perna, MD;  Location: [facility] OR;  Service: Open Heart Surgery;  Laterality: N/A;  . Clipping of atrial appendage Left 05/18/2012    Procedure: CLIPPING OF  ATRIAL APPENDAGE;  Surgeon: Kerin Perna, MD;  Location: [facility] OR;  Service: Open Heart Surgery;  Laterality: Left;  . Coronary artery bypass graft N/A 05/18/2012    Procedure: CORONARY ARTERY BYPASS GRAFTING (CABG);  Surgeon: Kerin Perna, MD;  Location: [facility] OR;  Service: Open Heart Surgery;  Laterality: N/A;  Times 4 using left internal mammary artery and endoscopically harvested right saphenous vein    Family History  Problem Relation Age of Onset  . Coronary artery disease Mother   . Heart disease Mother   . Coronary artery disease Sister   . Diabetes Sister   . Heart disease Sister 25    deceased  . Diabetes Father   . Heart disease Brother     Social History History  Substance Use Topics  . Smoking status: Former Smoker    Types: Cigars    Quit date: 10/04/1988  . Smokeless tobacco: Former Neurosurgeon    Types: Chew    Quit date: 09/04/1991  . Alcohol Use: 0.5 oz/week    1 drink(s) per week     Comment: occassionaly    Current Outpatient Prescriptions  Medication Sig Dispense Refill  . ANDROGEL PUMP 20.25 MG/ACT (1.62%) GEL Place 2 application onto the skin every morning.       [name] aspirin EC 81 MG tablet Take 81 mg by mouth every evening.      [name]  ezetimibe-simvastatin (VYTORIN) 10-20 MG per tablet Take 1 tablet by mouth at bedtime.  90 tablet  3  . polyethylene glycol (MIRALAX / GLYCOLAX) packet Take 17 g by mouth daily as needed (for constipation).       No current facility-administered medications for this visit.    Allergies  Allergen Reactions  . Ambien (Zolpidem Tartrate) Other (See Comments)    Super sensitive.   . Statins     REACTION: muscle aches...crestor  . Zolpidem Tartrate     Review of Systems alert and comfortable no distress Shows significant overall improvement since his last visit 4 weeks ago  BP 140/79  Pulse 60  Resp 18  Ht 6\' 1"  (1.854 m)  Wt 164 lb (74.39 kg)  BMI 21.64 kg/m2  SpO2 98% Physical Exam Alert and comfortable Lungs  clear Cardiac rhythm regular murmur or gallop Surgical incisions all healing nicely No peripheral edema  Diagnostic Tests: Chest x-ray clear  Impression: Excellent course 2 months following CABG with Maze procedure. He knows he can do yard work now lift up to 20 pounds. He will stop the amiodarone and I anticipate that will also improve his strength and help him regain some weight.  Plan: Return as needed

## 2012-07-23 ENCOUNTER — Other Ambulatory Visit: Payer: Self-pay

## 2012-08-10 ENCOUNTER — Encounter: Payer: Self-pay | Admitting: Cardiovascular Disease

## 2012-08-15 ENCOUNTER — Other Ambulatory Visit: Payer: Self-pay

## 2012-08-31 ENCOUNTER — Telehealth: Payer: Self-pay | Admitting: Internal Medicine

## 2012-08-31 DIAGNOSIS — Z125 Encounter for screening for malignant neoplasm of prostate: Secondary | ICD-10-CM

## 2012-08-31 DIAGNOSIS — Z0000 Encounter for general adult medical examination without abnormal findings: Secondary | ICD-10-CM

## 2012-08-31 DIAGNOSIS — I2510 Atherosclerotic heart disease of native coronary artery without angina pectoris: Secondary | ICD-10-CM

## 2012-08-31 DIAGNOSIS — R7989 Other specified abnormal findings of blood chemistry: Secondary | ICD-10-CM

## 2012-08-31 NOTE — Telephone Encounter (Signed)
Should have a follow up. Labs CBC, CMP, lipids, urine microalbumin, PSA Medicare and Testosterone. V70.9, Screening prostate cancer, low testosterone, coronary artery disease

## 2012-08-31 NOTE — Telephone Encounter (Signed)
Pt came in today and made a follow up appointment with dr walker on 9/8 pt wanted to get labs done prior to this appointment he also wanted to see if he could get a psa and testrone level done with his labs. Please advise pt  l

## 2012-08-31 NOTE — Telephone Encounter (Signed)
Fwd to Dr. Walker 

## 2012-09-04 NOTE — Telephone Encounter (Signed)
Appt has been scheduled and labs has been put in .

## 2012-09-04 NOTE — Telephone Encounter (Signed)
Spoke with patient, he stated he would just do everything on the day of his visit.

## 2012-09-11 ENCOUNTER — Ambulatory Visit: Payer: Medicare Other | Admitting: Cardiovascular Disease

## 2012-09-18 ENCOUNTER — Encounter: Payer: Self-pay | Admitting: Internal Medicine

## 2012-09-18 ENCOUNTER — Ambulatory Visit (INDEPENDENT_AMBULATORY_CARE_PROVIDER_SITE_OTHER): Payer: Medicare Other | Admitting: Internal Medicine

## 2012-09-18 VITALS — BP 128/70 | HR 62 | Temp 98.0°F | Wt 162.0 lb

## 2012-09-18 DIAGNOSIS — E785 Hyperlipidemia, unspecified: Secondary | ICD-10-CM

## 2012-09-18 DIAGNOSIS — I10 Essential (primary) hypertension: Secondary | ICD-10-CM

## 2012-09-18 DIAGNOSIS — H43392 Other vitreous opacities, left eye: Secondary | ICD-10-CM

## 2012-09-18 DIAGNOSIS — I2510 Atherosclerotic heart disease of native coronary artery without angina pectoris: Secondary | ICD-10-CM

## 2012-09-18 DIAGNOSIS — E291 Testicular hypofunction: Secondary | ICD-10-CM

## 2012-09-18 DIAGNOSIS — H43399 Other vitreous opacities, unspecified eye: Secondary | ICD-10-CM | POA: Insufficient documentation

## 2012-09-18 DIAGNOSIS — Z23 Encounter for immunization: Secondary | ICD-10-CM

## 2012-09-18 DIAGNOSIS — I4891 Unspecified atrial fibrillation: Secondary | ICD-10-CM

## 2012-09-18 NOTE — Assessment & Plan Note (Signed)
Continue Vytorin. Will check lipids and LFTs with labs in 2-3 months.

## 2012-09-18 NOTE — Assessment & Plan Note (Signed)
Symptoms well controlled with Androgel. Pt interested in change to IM testosterone. Discussed recent studies showing increased risk of CAD and CVA with testosterone supplementation. Will stay with Androgel for now. Recommended follow up with endocrinology.

## 2012-09-18 NOTE — Assessment & Plan Note (Signed)
BP Readings from Last 3 Encounters:  09/18/12 128/70  07/19/12 140/79  06/13/12 120/71   BP well controlled without medication. Will monitor.

## 2012-09-18 NOTE — Progress Notes (Signed)
  Subjective:    Patient ID: [name], male    DOB: [birth year], 79 y.o.   MRN: [id number]  HPI 79YO male with h/o CABG, MAZE procedure 05/2012 presents for follow up. Doing very well. Denies chest pain, palpitations, dyspnea. Completed cardio-pulmonary rehab at [facility] about 2 weeks ago. Now exercising regularly by walking daily and generally staying active. Notes some mild persistent aching pain in left lower chest at site of surgical scar. Does not take any medication for this.    Outpatient Encounter Prescriptions as of 09/18/2012  Medication Sig Dispense Refill  . ANDROGEL PUMP 20.25 MG/ACT (1.62%) GEL Place 2 application onto the skin every morning.       [name] aspirin EC 81 MG tablet Take 81 mg by mouth every evening.      . ezetimibe-simvastatin (VYTORIN) 10-20 MG per tablet Take 1 tablet by mouth at bedtime.  90 tablet  3  . polyethylene glycol (MIRALAX / GLYCOLAX) packet Take 17 g by mouth daily as needed (for constipation).       No facility-administered encounter medications on file as of 09/18/2012.   BP 128/70  Pulse 62  Temp(Src) 98 F (36.7 C) (Oral)  Wt 162 lb (73.483 kg)  BMI 21.38 kg/m2  SpO2 97%  Review of Systems  Constitutional: Negative for fever, chills, activity change, appetite change, fatigue and unexpected weight change.  Eyes: Negative for visual disturbance.  Respiratory: Negative for cough and shortness of breath.   Cardiovascular: Negative for chest pain, palpitations and leg swelling.  Gastrointestinal: Negative for abdominal pain and abdominal distention.  Genitourinary: Negative for dysuria, urgency and difficulty urinating.  Musculoskeletal: Negative for arthralgias and gait problem.  Skin: Negative for color change and rash.  Hematological: Negative for adenopathy.  Psychiatric/Behavioral: Negative for sleep disturbance and dysphoric mood. The patient is not nervous/anxious.        Objective:   Physical Exam  Constitutional: He is oriented  to person, place, and time. He appears well-developed and well-nourished. No distress.  HENT:  Head: Normocephalic and atraumatic.  Right Ear: External ear normal.  Left Ear: External ear normal.  Nose: Nose normal.  Mouth/Throat: Oropharynx is clear and moist. No oropharyngeal exudate.  Eyes: Conjunctivae and EOM are normal. Pupils are equal, round, and reactive to light. Right eye exhibits no discharge. Left eye exhibits no discharge. No scleral icterus.  Neck: Normal range of motion. Neck supple. No tracheal deviation present. No thyromegaly present.  Cardiovascular: Normal rate, regular rhythm and normal heart sounds.  Exam reveals no gallop and no friction rub.   No murmur heard. Pulmonary/Chest: Effort normal and breath sounds normal. No accessory muscle usage. Not tachypneic. No respiratory distress. He has no decreased breath sounds. He has no wheezes. He has no rhonchi. He has no rales. He exhibits no tenderness.    Musculoskeletal: Normal range of motion. He exhibits no edema.  Lymphadenopathy:    He has no cervical adenopathy.  Neurological: He is alert and oriented to person, place, and time. No cranial nerve deficit. Coordination normal.  Skin: Skin is warm and dry. No rash noted. He is not diaphoretic. No erythema. No pallor.  Psychiatric: He has a normal mood and affect. His behavior is normal. Judgment and thought content normal.          Assessment & Plan:

## 2012-09-18 NOTE — Assessment & Plan Note (Signed)
Followed by opthalmology, Dr Lelon Perla, in [place]. Plan for follow up this month, possible laser surgery.

## 2012-09-18 NOTE — Assessment & Plan Note (Signed)
Rhythm controlled after Maze procedure. Doing very well. Continue to monitor.

## 2012-09-18 NOTE — Assessment & Plan Note (Signed)
S/p CABG 05/2012. Doing very well. No further chest pain, dyspnea. Encouraged continued physical activity such as walking. Continue current medications.

## 2012-09-18 NOTE — Addendum Note (Signed)
Addended by: Theola Sequin on: 09/18/2012 04:13 PM   Modules accepted: Orders

## 2012-09-19 ENCOUNTER — Ambulatory Visit (INDEPENDENT_AMBULATORY_CARE_PROVIDER_SITE_OTHER): Payer: Medicare Other | Admitting: Cardiovascular Disease

## 2012-09-19 ENCOUNTER — Encounter: Payer: Self-pay | Admitting: Cardiovascular Disease

## 2012-09-19 VITALS — BP 138/84 | HR 60 | Ht 73.0 in | Wt 162.5 lb

## 2012-09-19 DIAGNOSIS — E785 Hyperlipidemia, unspecified: Secondary | ICD-10-CM

## 2012-09-19 DIAGNOSIS — I10 Essential (primary) hypertension: Secondary | ICD-10-CM

## 2012-09-19 DIAGNOSIS — I4891 Unspecified atrial fibrillation: Secondary | ICD-10-CM

## 2012-09-19 DIAGNOSIS — I2510 Atherosclerotic heart disease of native coronary artery without angina pectoris: Secondary | ICD-10-CM

## 2012-09-19 NOTE — Assessment & Plan Note (Signed)
Cholesterol is at goal on the current lipid regimen. No changes to the medications were made.  

## 2012-09-19 NOTE — Assessment & Plan Note (Signed)
He has taken himself off all medications. Encouraged him to closely monitor his heart rate and blood pressure. Maintaining normal sinus rhythm on today's visit

## 2012-09-19 NOTE — Progress Notes (Signed)
Patient ID: [name], male    DOB: 11-Oct-1932, 79 y.o.   MRN: [id number]  HPI Comments: Mr. Zion is a very pleasant 79 year old gentleman with a history of coronary artery disease, stent placed to his LAD in 1993, stent placed to his mid RCA in 2003, in-stent restenosis with repeat stent placed to his LAD in September 08, stent placed to his RCA at the same time, stress test in February 2009 and Jan 2014 which showed no ischemia, also with history of atrial fibrillation, maintained on low dose amiodarone and  presents for routine followup. history of low testosterone . He presents after CABG  Recurrent episode of chest pain at the beginning of may 2014. Presented to [facility], cardiac catheterization showing subtotal occlusion of his LAD stent, in-stent restenosis, severe OM1 and OM 2 disease also with RCA disease,  On  05/18/2012.  He underwent CABG x 4 utilizing LIMA to LAD, SVG to OM1, SVG to OM2, and SVG to PDA.  He also underwent Complete MAZE procedure with Clipping of Left Atrial Appendage.   Overall he has been doing well. He did not tolerate beta blockers or ACE inhibitor he had malaise, bradycardia. He took himself off these medications and does not want to restart any at this time. He denies any tachycardia or palpitations concerning for atrial fibrillation.  He does not like taking medications in general. He does some exercise. Thinking of signing out for super sneakers.  He is active on his property, doing yard work  In  January 2014 he  had an episode of atrial fibrillation and presented to the emergency room. Converted back to normal sinus rhythm. Discharged on pradaxa. Recurrent episodes of atrial fibrillation shortly after that.   previous  total cholesterol 150, LDL 83.  EKG shows sinus rhythm with rate 68 beats per minute, T-wave abnormality diffusely   Outpatient Encounter Prescriptions as of 09/19/2012  Medication Sig Dispense Refill  . ANDROGEL PUMP 20.25 MG/ACT (1.62%)  GEL Place 2 application onto the skin every morning.       [name] aspirin EC 81 MG tablet Take 81 mg by mouth every evening.      . ezetimibe-simvastatin (VYTORIN) 10-20 MG per tablet Take 1 tablet by mouth at bedtime.  90 tablet  3  . polyethylene glycol (MIRALAX / GLYCOLAX) packet Take 17 g by mouth daily as needed (for constipation).       No facility-administered encounter medications on file as of 09/19/2012.    Review of Systems  HENT: Negative.   Eyes: Negative.   Cardiovascular: Negative.   Gastrointestinal: Negative.   Musculoskeletal: Negative.   Skin: Negative.   Psychiatric/Behavioral: Negative.   All other systems reviewed and are negative.   BP 138/84  Pulse 60  Ht 6\' 1"  (1.854 m)  Wt 162 lb 8 oz (73.71 kg)  BMI 21.44 kg/m2  Physical Exam  Nursing note and vitals reviewed. Constitutional: He is oriented to person, place, and time. He appears well-developed and well-nourished.  HENT:  Head: Normocephalic.  Nose: Nose normal.  Mouth/Throat: Oropharynx is clear and moist.  Eyes: Conjunctivae are normal. Pupils are equal, round, and reactive to light.  Neck: Normal range of motion. Neck supple. No JVD present.  Cardiovascular: Normal rate, regular rhythm, S1 normal, S2 normal, normal heart sounds and intact distal pulses.  Exam reveals no gallop and no friction rub.   No murmur heard. Well healed mediastinal scar  Pulmonary/Chest: Effort normal and breath sounds normal. No respiratory  distress. He has no wheezes. He has no rales. He exhibits no tenderness.  Abdominal: Soft. Bowel sounds are normal. He exhibits no distension. There is no tenderness.  Musculoskeletal: Normal range of motion. He exhibits no edema and no tenderness.  Lymphadenopathy:    He has no cervical adenopathy.  Neurological: He is alert and oriented to person, place, and time. Coordination normal.  Skin: Skin is warm and dry. No rash noted. No erythema.  Psychiatric: He has a normal mood and affect.  His behavior is normal. Judgment and thought content normal.      Assessment and Plan

## 2012-09-19 NOTE — Patient Instructions (Addendum)
You are doing well. No medication changes were made.  Please call us if you have new issues that need to be addressed before your next appt.  Your physician wants you to follow-up in: 6 months.  You will receive a reminder letter in the mail two months in advance. If you don't receive a letter, please call our office to schedule the follow-up appointment.   

## 2012-09-19 NOTE — Assessment & Plan Note (Signed)
Blood pressure is well controlled on today's visit. No changes made to the medications. 

## 2012-09-19 NOTE — Assessment & Plan Note (Signed)
Currently with no symptoms of angina. No further workup at this time. Continue current medication regimen. He has taken himself off beta blockers and ACE inhibitors

## 2012-10-19 ENCOUNTER — Telehealth: Payer: Self-pay | Admitting: Internal Medicine

## 2012-10-19 ENCOUNTER — Other Ambulatory Visit (INDEPENDENT_AMBULATORY_CARE_PROVIDER_SITE_OTHER): Payer: Medicare Other

## 2012-10-19 DIAGNOSIS — R7989 Other specified abnormal findings of blood chemistry: Secondary | ICD-10-CM

## 2012-10-19 DIAGNOSIS — E291 Testicular hypofunction: Secondary | ICD-10-CM

## 2012-10-19 DIAGNOSIS — I2510 Atherosclerotic heart disease of native coronary artery without angina pectoris: Secondary | ICD-10-CM

## 2012-10-19 DIAGNOSIS — Z125 Encounter for screening for malignant neoplasm of prostate: Secondary | ICD-10-CM

## 2012-10-19 DIAGNOSIS — Z0000 Encounter for general adult medical examination without abnormal findings: Secondary | ICD-10-CM

## 2012-10-19 LAB — COMPREHENSIVE METABOLIC PANEL
AST: 17 U/L (ref 0–37)
Alkaline Phosphatase: 93 U/L (ref 39–117)
BUN: 20 mg/dL (ref 6–23)
Glucose, Bld: 106 mg/dL — ABNORMAL HIGH (ref 70–99)
Total Bilirubin: 1 mg/dL (ref 0.3–1.2)

## 2012-10-19 LAB — LIPID PANEL
Cholesterol: 129 mg/dL (ref 0–200)
HDL: 46.2 mg/dL (ref >39.00)
LDL Cholesterol: 69 mg/dL (ref 0–99)
Total CHOL/HDL Ratio: 3
Triglycerides: 67 mg/dL (ref 0.0–149.0)
VLDL: 13.4 mg/dL (ref 0.0–40.0)

## 2012-10-19 LAB — CBC
HCT: 42.4 % (ref 39.0–52.0)
MCHC: 33.3 g/dL (ref 30.0–36.0)
MCV: 84 fl (ref 78.0–100.0)
Platelets: 207 10*3/uL (ref 150.0–400.0)
RBC: 5.05 Mil/uL (ref 4.22–5.81)

## 2012-10-19 LAB — TESTOSTERONE: Testosterone: 287.85 ng/dL — ABNORMAL LOW (ref 350.00–890.00)

## 2012-10-19 NOTE — Telephone Encounter (Signed)
Pt would like a call with his lab results and he would also like a copy left up front so he can pick them up.  He does not use my chart

## 2012-10-19 NOTE — Telephone Encounter (Signed)
Patient just came in this morning at 8:20 am to have his labs drawn, they have not left the building to go to the lab yet so they can be tested. Once the results are ready and reviewed by Dr. Dan Humphreys they will be given to patient and a copy per his request. We only release Mychart results to patients with active Mychart accounts and if they have requested that.

## 2012-10-24 NOTE — Telephone Encounter (Signed)
Patient informed all labs were stable and a copy is at the front desk for him to pick up.

## 2012-11-09 ENCOUNTER — Encounter: Payer: Self-pay | Admitting: Internal Medicine

## 2012-11-09 ENCOUNTER — Ambulatory Visit (INDEPENDENT_AMBULATORY_CARE_PROVIDER_SITE_OTHER): Payer: Medicare Other | Admitting: Internal Medicine

## 2012-11-09 VITALS — BP 140/80 | HR 59 | Temp 97.7°F | Wt 164.0 lb

## 2012-11-09 DIAGNOSIS — I10 Essential (primary) hypertension: Secondary | ICD-10-CM

## 2012-11-09 DIAGNOSIS — E291 Testicular hypofunction: Secondary | ICD-10-CM

## 2012-11-09 MED ORDER — METOPROLOL TARTRATE 25 MG PO TABS: 12.5000 mg | ORAL_TABLET | Freq: Two times a day (BID) | ORAL | Status: DC

## 2012-11-10 NOTE — Assessment & Plan Note (Signed)
Pt has started IM testosterone with his urologist. Again we discussed potential risks of testosterone supplementation including increased risk of CAD and stroke. He understands risks and wishes to continue care. He will continue testosterone injections with his urologist.

## 2012-11-10 NOTE — Progress Notes (Signed)
  Subjective:    Patient ID: [name], male    DOB: [birth year], 79 y.o.   MRN: [id number]  HPI 79YO male presents as walk-in urgent visit with concern of elevated BP. He was at his urologist office and BP was noted to be elevated at 180/100s. He denies chest pain, palpitations. He has had headache over the last several days described as diffuse pressure. He took 1/2 metoprolol pill this afternoon to help lower his BP. He believes dose was 12.5mg .   He notes he was recently started on injected testosterone for hypogonadism. He will be seeing his urologist every 2 weeks for testosterone injection. First dose was today.  Outpatient Encounter Prescriptions as of 11/09/2012  Medication Sig  . aspirin EC 81 MG tablet Take 81 mg by mouth every evening.  . ezetimibe-simvastatin (VYTORIN) 10-20 MG per tablet Take 1 tablet by mouth at bedtime.  . polyethylene glycol (MIRALAX / GLYCOLAX) packet Take 17 g by mouth daily as needed (for constipation).  [name] testosterone cypionate (DEPOTESTOTERONE CYPIONATE) 200 MG/ML injection Inject into the muscle every 14 (fourteen) days.   BP 140/80  Pulse 59  Temp(Src) 97.7 F (36.5 C) (Oral)  Wt 164 lb (74.39 kg)  BMI 21.64 kg/m2  SpO2 97%  Review of Systems  Constitutional: Negative for fever, chills, activity change, appetite change, fatigue and unexpected weight change.  Eyes: Negative for visual disturbance.  Respiratory: Negative for cough and shortness of breath.   Cardiovascular: Negative for chest pain, palpitations and leg swelling.  Gastrointestinal: Negative for abdominal pain and abdominal distention.  Genitourinary: Negative for dysuria, urgency and difficulty urinating.  Musculoskeletal: Negative for arthralgias and gait problem.  Skin: Negative for color change and rash.  Hematological: Negative for adenopathy.  Psychiatric/Behavioral: Negative for sleep disturbance and dysphoric mood. The patient is not nervous/anxious.         Objective:   Physical Exam  Constitutional: He is oriented to person, place, and time. He appears well-developed and well-nourished. No distress.  HENT:  Head: Normocephalic and atraumatic.  Right Ear: External ear normal.  Left Ear: External ear normal.  Nose: Nose normal.  Mouth/Throat: Oropharynx is clear and moist. No oropharyngeal exudate.  Eyes: Conjunctivae and EOM are normal. Pupils are equal, round, and reactive to light. Right eye exhibits no discharge. Left eye exhibits no discharge. No scleral icterus.  Neck: Normal range of motion. Neck supple. No tracheal deviation present. No thyromegaly present.  Cardiovascular: Normal rate, regular rhythm and normal heart sounds.  Exam reveals no gallop and no friction rub.   No murmur heard. Pulmonary/Chest: Effort normal and breath sounds normal. No respiratory distress. He has no wheezes. He has no rales. He exhibits no tenderness.  Musculoskeletal: Normal range of motion. He exhibits no edema.  Lymphadenopathy:    He has no cervical adenopathy.  Neurological: He is alert and oriented to person, place, and time. No cranial nerve deficit. Coordination normal.  Skin: Skin is warm and dry. No rash noted. He is not diaphoretic. No erythema. No pallor.  Psychiatric: He has a normal mood and affect. His behavior is normal. Judgment and thought content normal.          Assessment & Plan:

## 2012-11-10 NOTE — Assessment & Plan Note (Signed)
BP Readings from Last 3 Encounters:  11/09/12 140/80  09/19/12 138/84  09/18/12 128/70   BP markedly elevated earlier today, however improved after Metoprolol 12.5mg  dose. Will resume Metoprolol 12.5mg  bid. Follow up next week for BP recheck.

## 2012-11-14 ENCOUNTER — Encounter: Payer: Self-pay | Admitting: Internal Medicine

## 2012-11-14 ENCOUNTER — Ambulatory Visit (INDEPENDENT_AMBULATORY_CARE_PROVIDER_SITE_OTHER): Payer: Medicare Other | Admitting: Internal Medicine

## 2012-11-14 VITALS — BP 122/72 | HR 67 | Temp 98.2°F | Wt 164.0 lb

## 2012-11-14 DIAGNOSIS — I10 Essential (primary) hypertension: Secondary | ICD-10-CM

## 2012-11-14 NOTE — Progress Notes (Signed)
  Subjective:    Patient ID: [name], male    DOB: 1932-09-21, 79 y.o.   MRN: [id number]  HPI 79YO male with h/o CAD, HTN presents for follow up after having markedly elevated BP last week. He has started back on Metoprolol 12.5mg  bid. BP typically 100s-120s/60-80s.  No chest pain, palpitations, dyspnea. Generally feeling well. No new concerns today.  Outpatient Encounter Prescriptions as of 11/14/2012  Medication Sig  . aspirin EC 81 MG tablet Take 81 mg by mouth every evening.  . ezetimibe-simvastatin (VYTORIN) 10-20 MG per tablet Take 1 tablet by mouth at bedtime.  . metoprolol tartrate (LOPRESSOR) 25 MG tablet Take 0.5 tablets (12.5 mg total) by mouth 2 (two) times daily.  . polyethylene glycol (MIRALAX / GLYCOLAX) packet Take 17 g by mouth daily as needed (for constipation).   BP 122/72  Pulse 67  Temp(Src) 98.2 F (36.8 C) (Oral)  Wt 164 lb (74.39 kg)  SpO2 98%  Review of Systems  Constitutional: Negative for fever, chills, activity change, appetite change, fatigue and unexpected weight change.  Eyes: Negative for visual disturbance.  Respiratory: Negative for cough and shortness of breath.   Cardiovascular: Negative for chest pain, palpitations and leg swelling.  Gastrointestinal: Negative for abdominal pain and abdominal distention.  Genitourinary: Negative for dysuria, urgency and difficulty urinating.  Musculoskeletal: Negative for arthralgias and gait problem.  Skin: Negative for color change and rash.  Hematological: Negative for adenopathy.  Psychiatric/Behavioral: Negative for sleep disturbance and dysphoric mood. The patient is not nervous/anxious.        Objective:   Physical Exam  Constitutional: He is oriented to person, place, and time. He appears well-developed and well-nourished. No distress.  HENT:  Head: Normocephalic and atraumatic.  Right Ear: External ear normal.  Left Ear: External ear normal.  Nose: Nose normal.  Mouth/Throat: Oropharynx is  clear and moist. No oropharyngeal exudate.  Eyes: Conjunctivae and EOM are normal. Pupils are equal, round, and reactive to light. Right eye exhibits no discharge. Left eye exhibits no discharge. No scleral icterus.  Neck: Normal range of motion. Neck supple. No tracheal deviation present. No thyromegaly present.  Cardiovascular: Normal rate, regular rhythm and normal heart sounds.  Exam reveals no gallop and no friction rub.   No murmur heard. Pulmonary/Chest: Effort normal and breath sounds normal. No respiratory distress. He has no wheezes. He has no rales. He exhibits no tenderness.  Musculoskeletal: Normal range of motion. He exhibits no edema.  Lymphadenopathy:    He has no cervical adenopathy.  Neurological: He is alert and oriented to person, place, and time. No cranial nerve deficit. Coordination normal.  Skin: Skin is warm and dry. No rash noted. He is not diaphoretic. No erythema. No pallor.  Psychiatric: He has a normal mood and affect. His behavior is normal. Judgment and thought content normal.          Assessment & Plan:

## 2012-11-14 NOTE — Progress Notes (Signed)
Pre-visit discussion using our clinic review tool. No additional management support is needed unless otherwise documented below in the visit note.  

## 2012-11-14 NOTE — Assessment & Plan Note (Signed)
BP much improved with Metoprolol 12.5mg  bid. Will continue for now. Follow up in 3 months and prn.

## 2012-12-10 ENCOUNTER — Telehealth: Payer: Self-pay | Admitting: Internal Medicine

## 2012-12-10 NOTE — Telephone Encounter (Signed)
Informed patient we do not have any samples of Vytorin.

## 2012-12-10 NOTE — Telephone Encounter (Signed)
Pt came by and wanted to know if he can get any samples of vitrion Please advise

## 2013-02-19 ENCOUNTER — Ambulatory Visit (INDEPENDENT_AMBULATORY_CARE_PROVIDER_SITE_OTHER): Payer: Medicare HMO | Admitting: Internal Medicine

## 2013-02-19 ENCOUNTER — Encounter: Payer: Self-pay | Admitting: Internal Medicine

## 2013-02-19 ENCOUNTER — Encounter: Payer: Self-pay | Admitting: *Deleted

## 2013-02-19 ENCOUNTER — Telehealth: Payer: Self-pay | Admitting: *Deleted

## 2013-02-19 VITALS — BP 132/72 | HR 64 | Temp 97.7°F | Ht 72.5 in | Wt 172.0 lb

## 2013-02-19 DIAGNOSIS — E785 Hyperlipidemia, unspecified: Secondary | ICD-10-CM

## 2013-02-19 DIAGNOSIS — R7309 Other abnormal glucose: Secondary | ICD-10-CM

## 2013-02-19 DIAGNOSIS — N529 Male erectile dysfunction, unspecified: Secondary | ICD-10-CM | POA: Insufficient documentation

## 2013-02-19 DIAGNOSIS — R739 Hyperglycemia, unspecified: Secondary | ICD-10-CM | POA: Insufficient documentation

## 2013-02-19 DIAGNOSIS — Z0000 Encounter for general adult medical examination without abnormal findings: Secondary | ICD-10-CM

## 2013-02-19 DIAGNOSIS — Z23 Encounter for immunization: Secondary | ICD-10-CM

## 2013-02-19 DIAGNOSIS — R7303 Prediabetes: Secondary | ICD-10-CM | POA: Insufficient documentation

## 2013-02-19 DIAGNOSIS — I10 Essential (primary) hypertension: Secondary | ICD-10-CM

## 2013-02-19 LAB — COMPREHENSIVE METABOLIC PANEL
ALBUMIN: 4.2 g/dL (ref 3.5–5.2)
ALT: 15 U/L (ref 0–53)
AST: 21 U/L (ref 0–37)
Alkaline Phosphatase: 78 U/L (ref 39–117)
BILIRUBIN TOTAL: 1.1 mg/dL (ref 0.3–1.2)
BUN: 18 mg/dL (ref 6–23)
CO2: 27 mEq/L (ref 19–32)
Calcium: 9.1 mg/dL (ref 8.4–10.5)
Chloride: 105 mEq/L (ref 96–112)
Creatinine, Ser: 1.1 mg/dL (ref 0.4–1.5)
GFR: 68.77 mL/min (ref >60.00)
GLUCOSE: 90 mg/dL (ref 70–99)
POTASSIUM: 4.4 meq/L (ref 3.5–5.1)
Sodium: 139 mEq/L (ref 135–145)
Total Protein: 7.2 g/dL (ref 6.0–8.3)

## 2013-02-19 LAB — HEMOGLOBIN A1C: Hgb A1c MFr Bld: 5.7 % (ref 4.6–6.5)

## 2013-02-19 LAB — LIPID PANEL
CHOLESTEROL: 122 mg/dL (ref 0–200)
HDL: 37.2 mg/dL — ABNORMAL LOW (ref >39.00)
LDL Cholesterol: 74 mg/dL (ref 0–99)
TRIGLYCERIDES: 56 mg/dL (ref 0.0–149.0)
Total CHOL/HDL Ratio: 3
VLDL: 11.2 mg/dL (ref 0.0–40.0)

## 2013-02-19 LAB — MICROALBUMIN / CREATININE URINE RATIO
Creatinine,U: 363.2 mg/dL
MICROALB/CREAT RATIO: 1.1 mg/g (ref 0.0–30.0)
Microalb, Ur: 4 mg/dL — ABNORMAL HIGH (ref 0.0–1.9)

## 2013-02-19 MED ORDER — SILDENAFIL CITRATE 50 MG PO TABS: 50.0000 mg | ORAL_TABLET | Freq: Every day | ORAL | Status: DC | PRN

## 2013-02-19 NOTE — Assessment & Plan Note (Signed)
General medical exam normal today. Encouraged healthy diet and regular physical activity. Appropriate screening performed. Health maintenance is UTD, except for Prevnar which was given today. Labs today including CMP, lipids, urine microalbumin. Recent PSA was 0.

## 2013-02-19 NOTE — Telephone Encounter (Signed)
Sure. Free and total.

## 2013-02-19 NOTE — Assessment & Plan Note (Signed)
Will check lipids and lfts with labs today. Continue Vytorin.

## 2013-02-19 NOTE — Assessment & Plan Note (Signed)
Previous h/o elevated BG. Will check A1c with labs. 

## 2013-02-19 NOTE — Progress Notes (Signed)
Pre-visit discussion using our clinic review tool. No additional management support is needed unless otherwise documented below in the visit note.  

## 2013-02-19 NOTE — Telephone Encounter (Signed)
Pt would like to add a Testosterone??

## 2013-02-19 NOTE — Assessment & Plan Note (Signed)
Likely multifactorial with h/o prostatectomy, hypogonadism. Will try using Viagra to see if any improvement. Discussed potential side effects of this medication. Follow up prn.

## 2013-02-19 NOTE — Progress Notes (Signed)
Subjective:    Patient ID: [name], male    DOB: May 18, 1932, 80 y.o.   MRN: [id number]  HPI The patient is here for annual Medicare wellness examination and management of other chronic and acute problems.   The risk factors are reflected in the social history.  The roster of all physicians providing medical care to patient - is listed in the Snapshot section of the chart.  Activities of daily living:  The patient is 100% independent in all ADLs: dressing, toileting, feeding as well as independent mobility. Lives with wife. Has outside 4 cats and inside 2 cats. Daughter lives in [place]. Step daughter lives nearby with 5 children.  Home safety : The patient does not smoke detectors in the home. They wear seatbelts.  There are no firearms at home. There is no violence in the home.   There is no risks for hepatitis, STDs or HIV. There is no history of blood transfusion. They have no travel history to infectious disease endemic areas of the world (except perhaps during travel to [place] in distant pass).  The patient has seen their dentist in the last six month. (Dr. Kathyrn Lass)  They have seen their eye doctor in the last year. (Opthalmology - [facility]) Had issues with floaters in the past, now improved.  They admit to slight hearing difficulty with regard to whispered voices and some television programs.  Also has bilateral tinnitus. They have deferred audiologic testing in the last year.  Was evaluated by Dr. Elenore Paddy in the past. Had hearing aids in the past but no improvement.  Urologist - Dr. Bernardo Heater  They do not  have excessive sun exposure. Discussed the need for sun protection: hats, long sleeves and use of sunscreen if there is significant sun exposure. Has been seen by Dr. Nehemiah Massed and Dr. Nicole Kindred in the past.  Diet: the importance of a healthy diet is discussed. They do have a healthy diet. Increased consumption of veggies, Quinoa. Taking Prulax.  The  benefits of regular aerobic exercise were discussed. He works gardening for exercise. Walks occasionally.  Depression screen: there are no signs or vegative symptoms of depression- irritability, change in appetite, anhedonia, sadness/tearfullness.  Cognitive assessment: the patient manages all their financial and personal affairs and is actively engaged. They could relate day,date,year and events.  HCPOA - [employer], wife, then Alverda Skeans, Daughter  The following portions of the patient's history were reviewed and updated as appropriate: allergies, current medications, past family history, past medical history,  past surgical history, past social history  and problem list.  Visual acuity was not assessed per patient preference since he has regular follow up with her ophthalmologist. Hearing and body mass index were assessed and reviewed.   During the course of the visit the patient was educated and counseled about appropriate screening and preventive services including : fall prevention , diabetes screening, nutrition counseling, colorectal cancer screening, and recommended immunizations.    Pt is concerned about recent problems both establishing and maintaining an erection for sexual intercourse. His cardiologist had reportedly suggested use of Viagra in the past, but he declined. He would now like to try this medication.  Outpatient Encounter Prescriptions as of 02/19/2013  Medication Sig  . aspirin EC 81 MG tablet Take 81 mg by mouth every evening.  . ezetimibe-simvastatin (VYTORIN) 10-20 MG per tablet Take 1 tablet by mouth at bedtime.  [name] testosterone cypionate (DEPOTESTOTERONE CYPIONATE) 200 MG/ML injection Inject into the muscle every 14 (  fourteen) days.  . metoprolol tartrate (LOPRESSOR) 25 MG tablet Take 0.5 tablets (12.5 mg total) by mouth 2 (two) times daily.  . sildenafil (VIAGRA) 50 MG tablet Take 1 tablet (50 mg total) by mouth daily as needed for erectile dysfunction.  .  [DISCONTINUED] polyethylene glycol (MIRALAX / GLYCOLAX) packet Take 17 g by mouth daily as needed (for constipation).   BP 132/72  Pulse 64  Temp(Src) 97.7 F (36.5 C) (Oral)  Ht 6' 0.5" (1.842 m)  Wt 172 lb (78.019 kg)  BMI 22.99 kg/m2  SpO2 98%   Review of Systems  Constitutional: Negative for fever, chills, activity change, appetite change, fatigue and unexpected weight change.  Eyes: Negative for visual disturbance.  Respiratory: Negative for cough and shortness of breath.   Cardiovascular: Negative for chest pain, palpitations and leg swelling.  Gastrointestinal: Negative for abdominal pain and abdominal distention.  Genitourinary: Negative for dysuria, urgency and difficulty urinating.  Musculoskeletal: Negative for arthralgias and gait problem.  Skin: Negative for color change and rash.  Hematological: Negative for adenopathy.  Psychiatric/Behavioral: Negative for sleep disturbance and dysphoric mood. The patient is not nervous/anxious.        Objective:   Physical Exam  Constitutional: He is oriented to person, place, and time. He appears well-developed and well-nourished. No distress.  HENT:  Head: Normocephalic and atraumatic.  Right Ear: External ear normal.  Left Ear: External ear normal.  Nose: Nose normal.  Mouth/Throat: Oropharynx is clear and moist. No oropharyngeal exudate.  Eyes: Conjunctivae and EOM are normal. Pupils are equal, round, and reactive to light. Right eye exhibits no discharge. Left eye exhibits no discharge. No scleral icterus.  Neck: Normal range of motion. Neck supple. No tracheal deviation present. No thyromegaly present.  Cardiovascular: Normal rate, regular rhythm and normal heart sounds.  Exam reveals no gallop and no friction rub.   No murmur heard. Pulmonary/Chest: Effort normal and breath sounds normal. No respiratory distress. He has no wheezes. He has no rales. He exhibits no tenderness.  Abdominal: Soft. Bowel sounds are normal. He  exhibits no distension and no mass. There is no tenderness. There is no rebound and no guarding.  Musculoskeletal: Normal range of motion. He exhibits no edema.  Lymphadenopathy:    He has no cervical adenopathy.  Neurological: He is alert and oriented to person, place, and time. No cranial nerve deficit. Coordination normal.  Skin: Skin is warm and dry. No rash noted. He is not diaphoretic. No erythema. No pallor.  Psychiatric: He has a normal mood and affect. His behavior is normal. Judgment and thought content normal.          Assessment & Plan:

## 2013-02-19 NOTE — Assessment & Plan Note (Signed)
BP Readings from Last 3 Encounters:  02/19/13 132/72  11/14/12 122/72  11/09/12 140/80   BP well controlled on Metoprolol. Will continue.

## 2013-02-20 ENCOUNTER — Telehealth: Payer: Self-pay | Admitting: Internal Medicine

## 2013-02-20 LAB — TESTOSTERONE, FREE, TOTAL, SHBG
SEX HORMONE BINDING: 38 nmol/L (ref 13–71)
TESTOSTERONE: 1790.26 ng/dL — AB (ref 300–890)
Testosterone, Free: 472.9 pg/mL — ABNORMAL HIGH (ref 47.0–244.0)
Testosterone-% Free: 2.6 % (ref 1.6–2.9)

## 2013-02-20 NOTE — Telephone Encounter (Signed)
Relevant patient education assigned to patient using Emmi. ° °

## 2013-03-18 ENCOUNTER — Ambulatory Visit (INDEPENDENT_AMBULATORY_CARE_PROVIDER_SITE_OTHER): Payer: Commercial Managed Care - HMO | Admitting: Cardiovascular Disease

## 2013-03-18 ENCOUNTER — Encounter: Payer: Self-pay | Admitting: Cardiovascular Disease

## 2013-03-18 VITALS — BP 136/78 | HR 63 | Ht 73.0 in | Wt 181.0 lb

## 2013-03-18 DIAGNOSIS — I2510 Atherosclerotic heart disease of native coronary artery without angina pectoris: Secondary | ICD-10-CM

## 2013-03-18 DIAGNOSIS — I10 Essential (primary) hypertension: Secondary | ICD-10-CM

## 2013-03-18 DIAGNOSIS — I4891 Unspecified atrial fibrillation: Secondary | ICD-10-CM

## 2013-03-18 DIAGNOSIS — E785 Hyperlipidemia, unspecified: Secondary | ICD-10-CM

## 2013-03-18 DIAGNOSIS — Z951 Presence of aortocoronary bypass graft: Secondary | ICD-10-CM

## 2013-03-18 NOTE — Patient Instructions (Signed)
You are doing well. No medication changes were made.  Please call us if you have new issues that need to be addressed before your next appt.  Your physician wants you to follow-up in: 6 months.  You will receive a reminder letter in the mail two months in advance. If you don't receive a letter, please call our office to schedule the follow-up appointment.   

## 2013-03-18 NOTE — Assessment & Plan Note (Signed)
Blood pressure is well controlled on today's visit. No changes made to the medications. 

## 2013-03-18 NOTE — Progress Notes (Signed)
Patient ID: [name], male    DOB: 05-10-1932, 80 y.o.   MRN: [id number]  HPI Comments: Mr. Epps is a very pleasant 80 year old gentleman with a history of coronary artery disease, stent placed to his LAD in 1993, stent placed to his mid RCA in 2003, in-stent restenosis with repeat stent placed to his LAD in September 08, stent placed to his RCA at the same time, stress test in February 2009 and Jan 2014 which showed no ischemia, also with history of atrial fibrillation, maintained on low dose amiodarone and  presents for routine followup. history of low testosterone . He presents after CABG  Recurrent episode of chest pain at the beginning of may 2014. Presented to [facility], cardiac catheterization showing subtotal occlusion of his LAD stent, in-stent restenosis, severe OM1 and OM 2 disease also with RCA disease,  On  05/18/2012.  He underwent CABG x 4 utilizing LIMA to LAD, SVG to OM1, SVG to OM2, and SVG to PDA.  He also underwent Complete MAZE procedure with Clipping of Left Atrial Appendage.   Overall he has been doing well. He did not tolerate beta blockers or ACE inhibitor he had malaise, bradycardia. He took himself off these medications and does not want to restart any at this time. He denies any tachycardia or palpitations concerning for atrial fibrillation.  In followup today, he reports that he fell backwards and hurt his sacrum. It is tender to palpation. Wonders if he needs a x-ray. Is also concerned about his elevated testosterone level.   He is otherwise active on his property, doing yard work He denies any recent atrial fibrillation episodes  In  January 2014 he  had an episode of atrial fibrillation and presented to the emergency room. Converted back to normal sinus rhythm. Discharged on pradaxa. Recurrent episodes of atrial fibrillation shortly after that.   previous  total cholesterol 122, LDL 74, hemoglobin A1c 5.7  EKG shows sinus rhythm with rate 63 beats per minute,  T-wave abnormality diffusely   Outpatient Encounter Prescriptions as of 03/18/2013  Medication Sig  . aspirin EC 81 MG tablet Take 81 mg by mouth every evening.  . ezetimibe-simvastatin (VYTORIN) 10-20 MG per tablet Take 1 tablet by mouth at bedtime.  . metoprolol tartrate (LOPRESSOR) 25 MG tablet Take 0.5 tablets (12.5 mg total) by mouth 2 (two) times daily.  . sildenafil (VIAGRA) 50 MG tablet Take 1 tablet (50 mg total) by mouth daily as needed for erectile dysfunction.  [name] testosterone cypionate (DEPOTESTOTERONE CYPIONATE) 200 MG/ML injection Inject into the muscle every 14 (fourteen) days.    Review of Systems  Constitutional: Negative.   HENT: Negative.   Eyes: Negative.   Respiratory: Negative.   Cardiovascular: Negative.   Gastrointestinal: Negative.   Endocrine: Negative.   Musculoskeletal: Negative.        Sore tailbone  Skin: Negative.   Allergic/Immunologic: Negative.   Neurological: Negative.   Hematological: Negative.   Psychiatric/Behavioral: Negative.   All other systems reviewed and are negative.   BP 136/78  Pulse 63  Ht 6\' 1"  (1.854 m)  Wt 181 lb (82.101 kg)  BMI 23.89 kg/m2  Physical Exam  Nursing note and vitals reviewed. Constitutional: He is oriented to person, place, and time. He appears well-developed and well-nourished.  HENT:  Head: Normocephalic.  Nose: Nose normal.  Mouth/Throat: Oropharynx is clear and moist.  Eyes: Conjunctivae are normal. Pupils are equal, round, and reactive to light.  Neck: Normal range of motion. Neck supple.  No JVD present.  Cardiovascular: Normal rate, regular rhythm, S1 normal, S2 normal, normal heart sounds and intact distal pulses.  Exam reveals no gallop and no friction rub.   No murmur heard. Well healed mediastinal scar  Pulmonary/Chest: Effort normal and breath sounds normal. No respiratory distress. He has no wheezes. He has no rales. He exhibits no tenderness.  Abdominal: Soft. Bowel sounds are normal. He  exhibits no distension. There is no tenderness.  Musculoskeletal: Normal range of motion. He exhibits no edema and no tenderness.  Lymphadenopathy:    He has no cervical adenopathy.  Neurological: He is alert and oriented to person, place, and time. Coordination normal.  Skin: Skin is warm and dry. No rash noted. No erythema.  Psychiatric: He has a normal mood and affect. His behavior is normal. Judgment and thought content normal.      Assessment and Plan

## 2013-03-18 NOTE — Assessment & Plan Note (Signed)
Continue current medications. No recent atrial fibrillation.

## 2013-03-18 NOTE — Assessment & Plan Note (Signed)
Currently with no symptoms of angina. No further workup at this time. Continue current medication regimen. 

## 2013-03-18 NOTE — Assessment & Plan Note (Signed)
Cholesterol is at goal on the current lipid regimen. No changes to the medications were made.  

## 2013-07-15 ENCOUNTER — Encounter: Payer: Self-pay | Admitting: Internal Medicine

## 2013-07-15 ENCOUNTER — Ambulatory Visit (INDEPENDENT_AMBULATORY_CARE_PROVIDER_SITE_OTHER): Payer: Commercial Managed Care - HMO | Admitting: Internal Medicine

## 2013-07-15 ENCOUNTER — Ambulatory Visit (INDEPENDENT_AMBULATORY_CARE_PROVIDER_SITE_OTHER)
Admission: RE | Admit: 2013-07-15 | Discharge: 2013-07-15 | Disposition: A | Discharge status: Home / Self Care | Payer: Commercial Managed Care - HMO | Source: Ambulatory Visit | Attending: Internal Medicine | Admitting: Internal Medicine

## 2013-07-15 VITALS — BP 130/90 | HR 74 | Temp 97.7°F | Ht 73.0 in | Wt 187.0 lb

## 2013-07-15 DIAGNOSIS — R071 Chest pain on breathing: Secondary | ICD-10-CM

## 2013-07-15 DIAGNOSIS — R0789 Other chest pain: Secondary | ICD-10-CM

## 2013-07-15 DIAGNOSIS — Z0000 Encounter for general adult medical examination without abnormal findings: Secondary | ICD-10-CM

## 2013-07-15 DIAGNOSIS — E785 Hyperlipidemia, unspecified: Secondary | ICD-10-CM

## 2013-07-15 DIAGNOSIS — N529 Male erectile dysfunction, unspecified: Secondary | ICD-10-CM

## 2013-07-15 DIAGNOSIS — N528 Other male erectile dysfunction: Secondary | ICD-10-CM

## 2013-07-15 MED ORDER — SIMVASTATIN 20 MG PO TABS: 20.0000 mg | ORAL_TABLET | Freq: Every day | ORAL | Status: DC

## 2013-07-15 MED ORDER — CYCLOBENZAPRINE HCL 5 MG PO TABS: 5.0000 mg | ORAL_TABLET | Freq: Three times a day (TID) | ORAL | Status: DC | PRN

## 2013-07-15 NOTE — Progress Notes (Signed)
Pre visit review using our clinic review tool, if applicable. No additional management support is needed unless otherwise documented below in the visit note. 

## 2013-07-15 NOTE — Patient Instructions (Signed)
Chest xray today.  Start Flexeril 5mg  up to three times daily for back pain.  Stop Vytorin and start Simvastatin 20mg  daily.  Check labs in 4 weeks.

## 2013-07-15 NOTE — Assessment & Plan Note (Signed)
Symptoms improved with Viagra. Will continue. Encouraged him to follow with urology as well.

## 2013-07-15 NOTE — Assessment & Plan Note (Signed)
Right chest Ayuso pain, likely muscular strain. Will get CXR given persistence of symptoms. Start Flexeril prn. Follow up in 1 month and if symptoms not improving.

## 2013-07-15 NOTE — Assessment & Plan Note (Signed)
Unable to afford Vytorin. Will change to Simvastatin. Plan recheck lipids and LFTs in 1 month.

## 2013-07-15 NOTE — Progress Notes (Signed)
Subjective:    Patient ID: [name], male    DOB: 12-15-1932, 80 y.o.   MRN: [id number]  HPI 80YO male presents for acute visit.  Back pain - Right sided mid back pain, aching for 2-3 months. No respiratory symptoms such as cough, dyspnea. No new activities. No h/o trauma. No urinary symptoms. Not taking anything for pain.  Notes that ED improved with use of Viagra. Looking for new Urologist as his moved to [place].  Unable to afford Vytorin. Would like to consider alternatives.  Review of Systems  Constitutional: Negative for fever, chills, activity change and fatigue.  HENT: Negative for congestion, ear discharge, ear pain, hearing loss, nosebleeds, postnasal drip, rhinorrhea, sinus pressure, sneezing, sore throat, tinnitus, trouble swallowing and voice change.   Eyes: Negative for discharge, redness, itching and visual disturbance.  Respiratory: Negative for cough, chest tightness, shortness of breath, wheezing and stridor.   Cardiovascular: Positive for chest pain. Negative for leg swelling.  Musculoskeletal: Positive for myalgias. Negative for arthralgias, neck pain and neck stiffness.  Skin: Negative for color change and rash.  Neurological: Negative for dizziness, facial asymmetry and headaches.  Psychiatric/Behavioral: Negative for sleep disturbance.       Objective:    BP 130/90  Pulse 74  Temp(Src) 97.7 F (36.5 C) (Oral)  Ht 6\' 1"  (1.854 m)  Wt 187 lb (84.823 kg)  BMI 24.68 kg/m2  SpO2 97% Physical Exam  Constitutional: He is oriented to person, place, and time. He appears well-developed and well-nourished. No distress.  HENT:  Head: Normocephalic and atraumatic.  Right Ear: External ear normal.  Left Ear: External ear normal.  Nose: Nose normal.  Mouth/Throat: Oropharynx is clear and moist. No oropharyngeal exudate.  Eyes: Conjunctivae and EOM are normal. Pupils are equal, round, and reactive to light. Right eye exhibits no discharge. Left eye  exhibits no discharge. No scleral icterus.  Neck: Normal range of motion. Neck supple. No tracheal deviation present. No thyromegaly present.  Cardiovascular: Normal rate, regular rhythm and normal heart sounds.  Exam reveals no gallop and no friction rub.   No murmur heard. Pulmonary/Chest: Effort normal and breath sounds normal. No accessory muscle usage. Not tachypneic. No respiratory distress. He has no decreased breath sounds. He has no wheezes. He has no rhonchi. He has no rales.   He exhibits no tenderness.  Musculoskeletal: Normal range of motion. He exhibits no edema.  Lymphadenopathy:    He has no cervical adenopathy.  Neurological: He is alert and oriented to person, place, and time. No cranial nerve deficit. Coordination normal.  Skin: Skin is warm and dry. No rash noted. He is not diaphoretic. No erythema. No pallor.  Psychiatric: He has a normal mood and affect. His behavior is normal. Judgment and thought content normal.          Assessment & Plan:   Problem List Items Addressed This Visit     Unprioritized   Erectile dysfunction     Symptoms improved with Viagra. Will continue. Encouraged him to follow with urology as well.    HYPERLIPIDEMIA-MIXED     Unable to afford Vytorin. Will change to Simvastatin. Plan recheck lipids and LFTs in 1 month.    Relevant Medications      simvastatin (ZOCOR) tablet   Right-sided chest Arel pain - Primary     Right chest Ducre pain, likely muscular strain. Will get CXR given persistence of symptoms. Start Flexeril prn. Follow up in 1 month and if symptoms not  improving.    Relevant Medications      cyclobenzaprine (FLEXERIL) tablet   Other Relevant Orders      DG Chest 2 View    Other Visit Diagnoses   Routine general medical examination at a health care facility        Relevant Orders       CBC with Differential       Comprehensive metabolic panel       Lipid panel       Microalbumin / creatinine urine ratio       PSA,  Medicare        Return in about 4 weeks (around 08/12/2013) for Wellness Visit.

## 2013-07-25 ENCOUNTER — Other Ambulatory Visit: Payer: Self-pay | Admitting: Internal Medicine

## 2013-08-12 ENCOUNTER — Other Ambulatory Visit: Payer: Self-pay | Admitting: Internal Medicine

## 2013-08-12 ENCOUNTER — Other Ambulatory Visit (INDEPENDENT_AMBULATORY_CARE_PROVIDER_SITE_OTHER): Payer: Commercial Managed Care - HMO

## 2013-08-12 DIAGNOSIS — Z0000 Encounter for general adult medical examination without abnormal findings: Secondary | ICD-10-CM

## 2013-08-12 DIAGNOSIS — E291 Testicular hypofunction: Secondary | ICD-10-CM

## 2013-08-12 LAB — CBC WITH DIFFERENTIAL/PLATELET
BASOS PCT: 0.3 % (ref 0.0–3.0)
Basophils Absolute: 0 10*3/uL (ref 0.0–0.1)
Eosinophils Absolute: 0.1 10*3/uL (ref 0.0–0.7)
Eosinophils Relative: 2.2 % (ref 0.0–5.0)
HCT: 46.4 % (ref 39.0–52.0)
Hemoglobin: 15.4 g/dL (ref 13.0–17.0)
Lymphocytes Relative: 19.6 % (ref 12.0–46.0)
Lymphs Abs: 1.3 10*3/uL (ref 0.7–4.0)
MCHC: 33.2 g/dL (ref 30.0–36.0)
MCV: 80.9 fl (ref 78.0–100.0)
MONO ABS: 0.6 10*3/uL (ref 0.1–1.0)
Monocytes Relative: 9.3 % (ref 3.0–12.0)
Neutro Abs: 4.7 10*3/uL (ref 1.4–7.7)
Neutrophils Relative %: 68.6 % (ref 43.0–77.0)
Platelets: 212 10*3/uL (ref 150.0–400.0)
RBC: 5.74 Mil/uL (ref 4.22–5.81)
RDW: 15.4 % (ref 11.5–15.5)
WBC: 6.8 10*3/uL (ref 4.0–10.5)

## 2013-08-12 LAB — COMPREHENSIVE METABOLIC PANEL
ALK PHOS: 72 U/L (ref 39–117)
ALT: 16 U/L (ref 0–53)
AST: 22 U/L (ref 0–37)
Albumin: 4.1 g/dL (ref 3.5–5.2)
BILIRUBIN TOTAL: 1 mg/dL (ref 0.2–1.2)
BUN: 19 mg/dL (ref 6–23)
CO2: 27 mEq/L (ref 19–32)
Calcium: 8.9 mg/dL (ref 8.4–10.5)
Chloride: 102 mEq/L (ref 96–112)
Creatinine, Ser: 1.2 mg/dL (ref 0.4–1.5)
GFR: 62.73 mL/min (ref >60.00)
Glucose, Bld: 111 mg/dL — ABNORMAL HIGH (ref 70–99)
Potassium: 4.4 mEq/L (ref 3.5–5.1)
Sodium: 134 mEq/L — ABNORMAL LOW (ref 135–145)
Total Protein: 7.1 g/dL (ref 6.0–8.3)

## 2013-08-12 LAB — MICROALBUMIN / CREATININE URINE RATIO
Creatinine,U: 263.3 mg/dL
Microalb Creat Ratio: 0.6 mg/g (ref 0.0–30.0)
Microalb, Ur: 1.7 mg/dL (ref 0.0–1.9)

## 2013-08-12 LAB — LIPID PANEL
CHOL/HDL RATIO: 4
Cholesterol: 123 mg/dL (ref 0–200)
HDL: 31.1 mg/dL — ABNORMAL LOW (ref >39.00)
LDL Cholesterol: 78 mg/dL (ref 0–99)
NonHDL: 91.9
Triglycerides: 71 mg/dL (ref 0.0–149.0)
VLDL: 14.2 mg/dL (ref 0.0–40.0)

## 2013-08-12 LAB — PSA, MEDICARE: PSA: 0.01 ng/ml — ABNORMAL LOW (ref 0.10–4.00)

## 2013-08-13 LAB — TESTOSTERONE, FREE, TOTAL, SHBG
Sex Hormone Binding: 33 nmol/L (ref 13–71)
TESTOSTERONE FREE: 206.3 pg/mL (ref 47.0–244.0)
Testosterone-% Free: 2.3 % (ref 1.6–2.9)
Testosterone: 881 ng/dL (ref 300–890)

## 2013-08-19 ENCOUNTER — Ambulatory Visit: Payer: Medicare HMO | Admitting: Internal Medicine

## 2013-08-21 ENCOUNTER — Encounter: Payer: Self-pay | Admitting: Internal Medicine

## 2013-08-21 ENCOUNTER — Ambulatory Visit (INDEPENDENT_AMBULATORY_CARE_PROVIDER_SITE_OTHER): Payer: Commercial Managed Care - HMO | Admitting: Internal Medicine

## 2013-08-21 VITALS — BP 122/70 | HR 76 | Temp 98.0°F | Ht 72.5 in | Wt 183.8 lb

## 2013-08-21 DIAGNOSIS — Z0000 Encounter for general adult medical examination without abnormal findings: Secondary | ICD-10-CM

## 2013-08-21 NOTE — Progress Notes (Signed)
Subjective:    Patient ID: [name], male    DOB: 1932-06-05, 80 y.o.   MRN: [id number]  HPI The patient is here for annual Medicare wellness examination and management of other chronic and acute problems.   The risk factors are reflected in the social history.  The roster of all physicians providing medical care to patient - is listed in the Snapshot section of the chart.  Activities of daily living:  The patient is 100% independent in all ADLs: dressing, toileting, feeding as well as independent mobility. Lives with wife. Has outside 4 cats and inside 2 cats. Daughter lives in [place]. Step daughter lives nearby with 5 children.  Home safety : The patient does not smoke detectors in the home. They wear seatbelts.  There are no firearms at home. There is no violence in the home.   There is no risks for hepatitis, STDs or HIV. There is no history of blood transfusion. They have no travel history to infectious disease endemic areas of the world (except perhaps during travel to [place] in distant pass).  The patient has seen their dentist in the last six month. (Dr. Kathyrn Lass)  They have seen their eye doctor in the last year. (Opthalmology - [facility]) Had issues with floaters in the past, now improved.  They admit to slight hearing difficulty with regard to whispered voices and some television programs.  Also has bilateral tinnitus. They have deferred audiologic testing in the last year.  Was evaluated by Dr. Elenore Paddy in the past. Had hearing aids in the past but no improvement.  Urologist - Dr. Bernardo Heater  They do not  have excessive sun exposure. Discussed the need for sun protection: hats, long sleeves and use of sunscreen if there is significant sun exposure. Has been seen by Dr. Nehemiah Massed and Dr. Nicole Kindred in the past.  Diet: the importance of a healthy diet is discussed. They do have a healthy diet. Increased consumption of veggies, Quinoa. Taking Prulax.  The  benefits of regular aerobic exercise were discussed. He works gardening for exercise. Walks occasionally.  Depression screen: there are no signs or vegative symptoms of depression- irritability, change in appetite, anhedonia, sadness/tearfullness.  Cognitive assessment: the patient manages all their financial and personal affairs and is actively engaged. They could relate day,date,year and events.  HCPOA - [name], wife, then [name], Daughter  The following portions of the patient's history were reviewed and updated as appropriate: allergies, current medications, past family history, past medical history,  past surgical history, past social history  and problem list.  Visual acuity was not assessed per patient preference since he has regular follow up with her ophthalmologist. Hearing and body mass index were assessed and reviewed.   During the course of the visit the patient was educated and counseled about appropriate screening and preventive services including : fall prevention , diabetes screening, nutrition counseling, colorectal cancer screening, and recommended immunizations.     Review of Systems  Constitutional: Negative for fever, chills, activity change, appetite change, fatigue and unexpected weight change.  Eyes: Negative for visual disturbance.  Respiratory: Negative for cough and shortness of breath.   Cardiovascular: Negative for chest pain, palpitations and leg swelling.  Gastrointestinal: Negative for nausea, vomiting, abdominal pain, diarrhea, constipation and abdominal distention.  Genitourinary: Negative for dysuria, urgency and difficulty urinating.  Musculoskeletal: Positive for myalgias. Negative for arthralgias and gait problem.  Skin: Negative for color change and rash.  Hematological: Negative for adenopathy.  Psychiatric/Behavioral: Negative for sleep disturbance and dysphoric mood. The patient is not nervous/anxious.        Objective:    BP  122/70  Pulse 76  Temp(Src) 98 F (36.7 C) (Oral)  Ht 6' 0.5" (1.842 m)  Wt 183 lb 12 oz (83.348 kg)  BMI 24.56 kg/m2  SpO2 96% Physical Exam  Constitutional: He is oriented to person, place, and time. He appears well-developed and well-nourished. No distress.  HENT:  Head: Normocephalic and atraumatic.  Right Ear: External ear normal.  Left Ear: External ear normal.  Nose: Nose normal.  Mouth/Throat: Oropharynx is clear and moist. No oropharyngeal exudate.  Eyes: Conjunctivae and EOM are normal. Pupils are equal, round, and reactive to light. Right eye exhibits no discharge. Left eye exhibits no discharge. No scleral icterus.  Neck: Normal range of motion. Neck supple. No tracheal deviation present. No thyromegaly present.  Cardiovascular: Normal rate, regular rhythm and normal heart sounds.  Exam reveals no gallop and no friction rub.   No murmur heard. Pulmonary/Chest: Effort normal and breath sounds normal. No respiratory distress. He has no wheezes. He has no rales. He exhibits no tenderness.  Abdominal: Soft. Bowel sounds are normal. He exhibits no distension and no mass. There is no tenderness. There is no rebound and no guarding.  Musculoskeletal: Normal range of motion. He exhibits no edema.  Lymphadenopathy:    He has no cervical adenopathy.  Neurological: He is alert and oriented to person, place, and time. No cranial nerve deficit. Coordination normal.  Skin: Skin is warm and dry. No rash noted. He is not diaphoretic. No erythema. No pallor.  Psychiatric: He has a normal mood and affect. His behavior is normal. Judgment and thought content normal.          Assessment & Plan:   Problem List Items Addressed This Visit     Unprioritized   Medicare annual wellness visit, subsequent - Primary     General medical exam normal today. Encouraged healthy diet and regular physical activity. Appropriate screening performed. Health maintenance is UTD. Reviewed recent labs today  including CMP, lipids, urine microalbumin. Recent PSA was 0.01.          Return in about 6 months (around 02/21/2014) for Recheck.

## 2013-08-21 NOTE — Patient Instructions (Signed)

## 2013-08-21 NOTE — Progress Notes (Signed)
Pre visit review using our clinic review tool, if applicable. No additional management support is needed unless otherwise documented below in the visit note. 

## 2013-08-21 NOTE — Assessment & Plan Note (Signed)
General medical exam normal today. Encouraged healthy diet and regular physical activity. Appropriate screening performed. Health maintenance is UTD. Reviewed recent labs today including CMP, lipids, urine microalbumin. Recent PSA was 0.01.

## 2013-08-30 ENCOUNTER — Encounter: Payer: Self-pay | Admitting: Internal Medicine

## 2013-09-13 ENCOUNTER — Telehealth: Payer: Self-pay | Admitting: Internal Medicine

## 2013-09-13 NOTE — Telephone Encounter (Signed)
Pt stopped by to notify that he is switch pharmacies. Please send new rx to [facility] [email or web address]

## 2013-09-18 NOTE — Telephone Encounter (Signed)
Doesn't need any refills now. Just want to notify Dr. Gilford Rile of pharmacy change.

## 2013-09-18 NOTE — Telephone Encounter (Signed)
What Rx needs to be sent to the pharmacy?

## 2013-09-22 ENCOUNTER — Emergency Department: Payer: Self-pay | Admitting: Emergency Medicine

## 2013-09-22 LAB — URINALYSIS, COMPLETE
BACTERIA: NONE SEEN
BILIRUBIN, UR: NEGATIVE
Blood: NEGATIVE
Glucose,UR: 50 mg/dL (ref 0–75)
Nitrite: NEGATIVE
PH: 6 (ref 4.5–8.0)
Protein: NEGATIVE
RBC,UR: NONE SEEN /HPF (ref 0–5)
SPECIFIC GRAVITY: 1.02 (ref 1.003–1.030)
SQUAMOUS EPITHELIAL: NONE SEEN
WBC UR: 1 /HPF (ref 0–5)

## 2013-09-22 LAB — CBC
HCT: 49.4 % (ref 40.0–52.0)
HGB: 15.7 g/dL (ref 13.0–18.0)
MCH: 26.6 pg (ref 26.0–34.0)
MCHC: 31.9 g/dL — AB (ref 32.0–36.0)
MCV: 84 fL (ref 80–100)
Platelet: 208 10*3/uL (ref 150–440)
RBC: 5.91 10*6/uL — ABNORMAL HIGH (ref 4.40–5.90)
RDW: 14.9 % — ABNORMAL HIGH (ref 11.5–14.5)
WBC: 8.5 10*3/uL (ref 3.8–10.6)

## 2013-09-22 LAB — COMPREHENSIVE METABOLIC PANEL
Albumin: 3.7 g/dL (ref 3.4–5.0)
Alkaline Phosphatase: 91 U/L
Anion Gap: 5 — ABNORMAL LOW (ref 7–16)
BUN: 16 mg/dL (ref 7–18)
Bilirubin,Total: 1.2 mg/dL — ABNORMAL HIGH (ref 0.2–1.0)
CO2: 21 mmol/L (ref 21–32)
CREATININE: 1.28 mg/dL (ref 0.60–1.30)
Calcium, Total: 8.3 mg/dL — ABNORMAL LOW (ref 8.5–10.1)
Chloride: 108 mmol/L — ABNORMAL HIGH (ref 98–107)
EGFR (African American): >60
GFR CALC NON AF AMER: 53 — AB
Glucose: 149 mg/dL — ABNORMAL HIGH (ref 65–99)
Osmolality: 272 (ref 275–301)
Potassium: 3.9 mmol/L (ref 3.5–5.1)
SGOT(AST): 28 U/L (ref 15–37)
SGPT (ALT): 20 U/L
Sodium: 134 mmol/L — ABNORMAL LOW (ref 136–145)
TOTAL PROTEIN: 7.4 g/dL (ref 6.4–8.2)

## 2013-09-22 LAB — TROPONIN I: Troponin-I: <0.02 ng/mL

## 2013-09-22 LAB — LIPASE, BLOOD: Lipase: 89 U/L (ref 73–393)

## 2013-09-25 ENCOUNTER — Ambulatory Visit (INDEPENDENT_AMBULATORY_CARE_PROVIDER_SITE_OTHER): Payer: Commercial Managed Care - HMO | Admitting: Cardiovascular Disease

## 2013-09-25 ENCOUNTER — Encounter: Payer: Self-pay | Admitting: Cardiovascular Disease

## 2013-09-25 VITALS — BP 130/82 | HR 70 | Ht 73.0 in | Wt 182.8 lb

## 2013-09-25 DIAGNOSIS — I10 Essential (primary) hypertension: Secondary | ICD-10-CM

## 2013-09-25 DIAGNOSIS — I4891 Unspecified atrial fibrillation: Secondary | ICD-10-CM

## 2013-09-25 DIAGNOSIS — Z951 Presence of aortocoronary bypass graft: Secondary | ICD-10-CM | POA: Diagnosis not present

## 2013-09-25 DIAGNOSIS — E785 Hyperlipidemia, unspecified: Secondary | ICD-10-CM | POA: Diagnosis not present

## 2013-09-25 DIAGNOSIS — R42 Dizziness and giddiness: Secondary | ICD-10-CM | POA: Diagnosis not present

## 2013-09-25 NOTE — Assessment & Plan Note (Signed)
Blood pressure is well controlled on today's visit. No changes made to the medications. 

## 2013-09-25 NOTE — Progress Notes (Signed)
Patient ID: [name], male    DOB: [birth year], 80 y.o.   MRN: [id number]  HPI Comments: [name] is a very pleasant 80 year old gentleman with a history of coronary artery disease, bypass surgery may 2014 . Also atrial fibrillation, previously on amiodarone He had a stent placed to his LAD in 1993, stent placed to his mid RCA in 2003, in-stent restenosis with repeat stent placed to his LAD in September 08, stent placed to his RCA at the same time, stress test in February 2009 and Jan 2014 which showed no ischemia,  history of low testosterone .  He presents for routine followup  In followup today, he reports that he was doing well until this past weekend. He was in bed when he developed dizziness. He is unable to get out of bed without assistance. Dizziness occurred while supine as well as sitting and standing. He went to the emergency room, was told that he could be dehydrated and was given IV fluids x2 bags. This seemed to improve his symptoms and was discharged home. Other workup in the hospital was negative including negative cardiac enzymes, normal basic metabolic panel, normal LFTs, normal EKG Since then he has felt better but still recovering. Denies having any low blood pressure, no palpitations concerning for atrial fibrillation, no chest pain or shortness of breath. Prior to this He was otherwise active on his property, doing yard work  Previously had chest pain in may 2014. Presented to [facility], cardiac catheterization showing subtotal occlusion of his LAD stent, in-stent restenosis, severe OM1 and OM 2 disease also with RCA disease,  On  05/18/2012.  He underwent CABG x 4 utilizing LIMA to LAD, SVG to OM1, SVG to OM2, and SVG to PDA.  He also underwent Complete MAZE procedure with Clipping of Left Atrial Appendage.    He did not tolerate beta blockers or ACE inhibitor he had malaise, bradycardia. He took himself off these medications.   In  January 2014 he  had an episode of atrial  fibrillation and presented to the emergency room. Converted back to normal sinus rhythm. Discharged on pradaxa. Recurrent episodes of atrial fibrillation shortly after that.   previous  total cholesterol 122, LDL 74, hemoglobin A1c 5.7  EKG shows sinus rhythm with  T-wave abnormality diffusely, seen previously   Outpatient Encounter Prescriptions as of 09/25/2013  Medication Sig  . aspirin EC 81 MG tablet Take 81 mg by mouth every evening.  [name] MAGNESIUM-POTASSIUM PO Take by mouth.  . polyethylene glycol powder (GLYCOLAX/MIRALAX) powder TAKE 17 G BY MOUTH AS DIRECTED.  [name] simvastatin (ZOCOR) 20 MG tablet Take 1 tablet (20 mg total) by mouth at bedtime.  [name] testosterone cypionate (DEPOTESTOTERONE CYPIONATE) 200 MG/ML injection Inject into the muscle every 14 (fourteen) days.    Review of Systems  Constitutional: Negative.   HENT: Negative.   Eyes: Negative.   Respiratory: Negative.   Cardiovascular: Negative.   Gastrointestinal: Negative.   Endocrine: Negative.   Musculoskeletal: Negative.        Sore tailbone  Skin: Negative.   Allergic/Immunologic: Negative.   Neurological: Positive for dizziness.  Hematological: Negative.   Psychiatric/Behavioral: Negative.   All other systems reviewed and are negative.  BP 130/82  Pulse 70  Ht 6\' 1"  (1.854 m)  Wt 182 lb 12 oz (82.895 kg)  BMI 24.12 kg/m2  Physical Exam  Nursing note and vitals reviewed. Constitutional: He is oriented to person, place, and time. He appears well-developed and well-nourished.  HENT:  Head: Normocephalic.  Nose: Nose normal.  Mouth/Throat: Oropharynx is clear and moist.  Eyes: Conjunctivae are normal. Pupils are equal, round, and reactive to light.  Neck: Normal range of motion. Neck supple. No JVD present.  Cardiovascular: Normal rate, regular rhythm, S1 normal, S2 normal, normal heart sounds and intact distal pulses.  Exam reveals no gallop and no friction rub.   No murmur heard. Well healed mediastinal  scar  Pulmonary/Chest: Effort normal and breath sounds normal. No respiratory distress. He has no wheezes. He has no rales. He exhibits no tenderness.  Abdominal: Soft. Bowel sounds are normal. He exhibits no distension. There is no tenderness.  Musculoskeletal: Normal range of motion. He exhibits no edema and no tenderness.  Lymphadenopathy:    He has no cervical adenopathy.  Neurological: He is alert and oriented to person, place, and time. Coordination normal.  Skin: Skin is warm and dry. No rash noted. No erythema.  Psychiatric: He has a normal mood and affect. His behavior is normal. Judgment and thought content normal.      Assessment and Plan

## 2013-09-25 NOTE — Assessment & Plan Note (Signed)
Currently with no symptoms of angina. No further workup at this time. Continue current medication regimen. 

## 2013-09-25 NOTE — Assessment & Plan Note (Addendum)
Hospital records were reviewed. Etiology of his symptoms last weekend is unclear. Evaluation in the emergency room was unrevealing. Lab work did not appear to be consistent with dehydration but he reports feeling better after IV fluids. Suggested he cough [place] for recurrent symptoms. Less likely ischemia as lab work was normal. Potentially an event monitor could be ordered for her current symptoms. he does not feel that his symptoms were secondary to vertigo

## 2013-09-25 NOTE — Assessment & Plan Note (Signed)
Maintaining normal sinus rhythm. He denies having recent atrial fibrillation. No medication changes made

## 2013-09-25 NOTE — Assessment & Plan Note (Signed)
Cholesterol is at goal on the current lipid regimen. No changes to the medications were made.  

## 2013-09-25 NOTE — Patient Instructions (Signed)
You are doing well. No medication changes were made.  Please call us if you have new issues that need to be addressed before your next appt.  Your physician wants you to follow-up in: 6 months.  You will receive a reminder letter in the mail two months in advance. If you don't receive a letter, please call our office to schedule the follow-up appointment.   

## 2013-10-08 ENCOUNTER — Telehealth: Payer: Self-pay | Admitting: Internal Medicine

## 2013-10-08 DIAGNOSIS — N39498 Other specified urinary incontinence: Secondary | ICD-10-CM

## 2013-10-08 NOTE — Telephone Encounter (Signed)
Please see below note

## 2013-10-08 NOTE — Telephone Encounter (Signed)
Pt stopped by to request a rx bladder problems (no name given for rx, pt stated that Dr. Gilford Rile can decided which rx that he needs). Pt stated that he leaks a lot. Please advise pt.msn

## 2013-10-09 NOTE — Telephone Encounter (Signed)
We will need to set up an appointment to discuss this. However, he has a urologist and I would also recommend follow up with them

## 2013-10-09 NOTE — Telephone Encounter (Signed)
Pt states that he was being seen by Dr Bernardo Heater will need a new referral since he moved to a new practice. Will he need to be seen by you prior to the referral?

## 2013-10-09 NOTE — Telephone Encounter (Signed)
Notified pt. 

## 2013-10-09 NOTE — Telephone Encounter (Signed)
No, he does not need to be seen. I will place referral to [facility].

## 2013-10-25 ENCOUNTER — Other Ambulatory Visit: Payer: Self-pay

## 2013-12-17 ENCOUNTER — Other Ambulatory Visit: Payer: Self-pay | Admitting: *Deleted

## 2013-12-17 ENCOUNTER — Telehealth: Payer: Self-pay | Admitting: Internal Medicine

## 2013-12-17 DIAGNOSIS — R0789 Other chest pain: Secondary | ICD-10-CM

## 2013-12-17 MED ORDER — CYCLOBENZAPRINE HCL 5 MG PO TABS: 5.0000 mg | ORAL_TABLET | Freq: Three times a day (TID) | ORAL | Status: DC | PRN

## 2013-12-17 NOTE — Telephone Encounter (Signed)
Fine to refill 

## 2013-12-17 NOTE — Telephone Encounter (Signed)
Pt needs rx refill on cyclobenzaprine HCL 5mg  called to [facility] [address]

## 2013-12-17 NOTE — Telephone Encounter (Signed)
Last OV 8.12.15.  As per med list, pt completed course on 9.16.15.  Please advise refill

## 2013-12-19 ENCOUNTER — Encounter (HOSPITAL_COMMUNITY): Payer: Self-pay | Admitting: Cardiovascular Disease

## 2013-12-19 ENCOUNTER — Encounter: Payer: Self-pay | Admitting: Internal Medicine

## 2014-01-01 ENCOUNTER — Encounter: Payer: Self-pay | Admitting: *Deleted

## 2014-01-15 NOTE — Telephone Encounter (Signed)
Mailed unread message to pt  

## 2014-02-04 ENCOUNTER — Inpatient Hospital Stay: Payer: Self-pay | Admitting: Internal Medicine

## 2014-02-04 DIAGNOSIS — Z01818 Encounter for other preprocedural examination: Secondary | ICD-10-CM

## 2014-02-04 LAB — BASIC METABOLIC PANEL
Anion Gap: 3 — ABNORMAL LOW (ref 7–16)
BUN: 19 mg/dL — ABNORMAL HIGH (ref 7–18)
CALCIUM: 9 mg/dL (ref 8.5–10.1)
CHLORIDE: 105 mmol/L (ref 98–107)
CO2: 30 mmol/L (ref 21–32)
CREATININE: 1.4 mg/dL — AB (ref 0.60–1.30)
EGFR (African American): >60
EGFR (Non-African Amer.): 52 — ABNORMAL LOW
Glucose: 139 mg/dL — ABNORMAL HIGH (ref 65–99)
Osmolality: 280 (ref 275–301)
Potassium: 4.1 mmol/L (ref 3.5–5.1)
SODIUM: 138 mmol/L (ref 136–145)

## 2014-02-04 LAB — CBC
HCT: 49.1 % (ref 40.0–52.0)
HGB: 16.1 g/dL (ref 13.0–18.0)
MCH: 27.5 pg (ref 26.0–34.0)
MCHC: 32.7 g/dL (ref 32.0–36.0)
MCV: 84 fL (ref 80–100)
Platelet: 169 10*3/uL (ref 150–440)
RBC: 5.84 10*6/uL (ref 4.40–5.90)
RDW: 14 % (ref 11.5–14.5)
WBC: 7 10*3/uL (ref 3.8–10.6)

## 2014-02-05 LAB — CBC WITH DIFFERENTIAL/PLATELET
BASOS ABS: 0 10*3/uL (ref 0.0–0.1)
Basophil %: 0.2 %
Eosinophil #: 0.2 10*3/uL (ref 0.0–0.7)
Eosinophil %: 1.7 %
HCT: 38.6 % — ABNORMAL LOW (ref 40.0–52.0)
HGB: 12.4 g/dL — ABNORMAL LOW (ref 13.0–18.0)
Lymphocyte #: 0.8 10*3/uL — ABNORMAL LOW (ref 1.0–3.6)
Lymphocyte %: 7 %
MCH: 27.1 pg (ref 26.0–34.0)
MCHC: 32.1 g/dL (ref 32.0–36.0)
MCV: 84 fL (ref 80–100)
MONOS PCT: 8.3 %
Monocyte #: 1 x10 3/mm (ref 0.2–1.0)
Neutrophil #: 10 10*3/uL — ABNORMAL HIGH (ref 1.4–6.5)
Neutrophil %: 82.8 %
PLATELETS: 135 10*3/uL — AB (ref 150–440)
RBC: 4.58 10*6/uL (ref 4.40–5.90)
RDW: 13.8 % (ref 11.5–14.5)
WBC: 12 10*3/uL — ABNORMAL HIGH (ref 3.8–10.6)

## 2014-02-05 LAB — BASIC METABOLIC PANEL
ANION GAP: 8 (ref 7–16)
BUN: 21 mg/dL — AB (ref 7–18)
CHLORIDE: 107 mmol/L (ref 98–107)
Calcium, Total: 7.9 mg/dL — ABNORMAL LOW (ref 8.5–10.1)
Co2: 24 mmol/L (ref 21–32)
Creatinine: 1.15 mg/dL (ref 0.60–1.30)
EGFR (African American): >60
Glucose: 113 mg/dL — ABNORMAL HIGH (ref 65–99)
Osmolality: 281 (ref 275–301)
Potassium: 4.3 mmol/L (ref 3.5–5.1)
Sodium: 139 mmol/L (ref 136–145)

## 2014-02-06 LAB — HEMOGLOBIN: HGB: 11.2 g/dL — AB (ref 13.0–18.0)

## 2014-02-13 ENCOUNTER — Encounter: Payer: Self-pay | Admitting: Nurse Practitioner

## 2014-02-13 ENCOUNTER — Ambulatory Visit: Payer: Commercial Managed Care - HMO | Admitting: Nurse Practitioner

## 2014-02-13 ENCOUNTER — Ambulatory Visit (INDEPENDENT_AMBULATORY_CARE_PROVIDER_SITE_OTHER): Payer: PPO | Admitting: Nurse Practitioner

## 2014-02-13 VITALS — BP 130/60 | HR 79 | Temp 98.2°F | Resp 14 | Ht 73.0 in | Wt 179.4 lb

## 2014-02-13 DIAGNOSIS — Z09 Encounter for follow-up examination after completed treatment for conditions other than malignant neoplasm: Secondary | ICD-10-CM

## 2014-02-13 NOTE — Progress Notes (Signed)
Subjective:    Patient ID: [name], male    DOB: 23-Apr-1932, 81 y.o.   MRN: [id number]  HPI  Mr. Plotz is a 81 yo male with a CC of hospital follow up.   1) fall- bruised ribs, enlarged knee, aches, sore  02/04/14- taking to work, slipped on ice, Did not hit head or lose conciousness, fell on right elbow, hip, ribs.   Total Hip Replacement done urgently on right hip. Pt is ambulating well with minimal assistance from a rolling walker.  Oxycodone- Taking as needed Tramadol- Taking as needed Flexeril- Has not taken with the other medications  Last night slept well  PT coming today 4th visit 2 more next week at home Outpatient after, [facility] 02/17/14.   Review of Systems  Constitutional: Negative for fever, chills, diaphoresis and fatigue.  Eyes: Negative for visual disturbance.  Respiratory: Negative for chest tightness, shortness of breath and wheezing.   Cardiovascular: Negative for chest pain, palpitations and leg swelling.  Gastrointestinal: Negative for nausea, vomiting and diarrhea.  Musculoskeletal: Positive for myalgias and arthralgias.       Uses rolling walker  Skin: Negative for rash.  Neurological: Negative for dizziness, weakness, numbness and headaches.   Past Medical History  Diagnosis Date  . Coronary artery disease     a. 1993 s/p PCI/BMS LAD;  b. 2003 PCI RCA;  c. 09/2006 ISR LAD->PCI, PCI RCA;  d. 02/2007 MV: no ischemia;  d. 01/2012 MV: EF 74%, no ischemia.  . Hyperlipidemia   . Hypertension   . Hyperthyroidism   . PAF (paroxysmal atrial fibrillation)     a. previously on amio ->stopped in 2013;  b. recurrent PAF 01/2012->Pradaxa and amio initiated, subsequently came off of pradaxa 2/2 cost;  c. 02/2012 Echo: EF 55-60%, Gr 1 DD.  [name] Low testosterone   . History of prostate cancer     History   Social History  . Marital Status: Married    Spouse Name: N/A    Number of Children: N/A  . Years of Education: N/A   Occupational History  . Not on file.    Social History Main Topics  . Smoking status: Never Smoker   . Smokeless tobacco: Former Systems developer    Types: [place] date: 09/04/1991  . Alcohol Use: 0.5 oz/week    1 drink(s) per week     Comment: occassionaly  . Drug Use: No  . Sexual Activity: Not Currently   Other Topics Concern  . Not on file   Social History Narrative   Lives in [place] with wife. Has 2 cats, dog died. Daughter in [place], 2 step-daughters. Retired from [employer], worked in Radio producer. Grew up in [place].    Past Surgical History  Procedure Laterality Date  . Cardiac catheterization  2008  . Prostatectomy    . Hernia repair      s/p mesh bilaterally, Dr. Bary Castilla  . Appendectomy      Dr. Bary Castilla  . Colonoscopy  2013  . Intraoperative transesophageal echocardiogram N/A 05/18/2012    Procedure: INTRAOPERATIVE TRANSESOPHAGEAL ECHOCARDIOGRAM;  Surgeon: [name], MD;  Location: [place];  Service: Open Heart Surgery;  Laterality: N/A;  . Maze N/A 05/18/2012    Procedure: MAZE;  Surgeon: [name], MD;  Location: [place];  Service: Open Heart Surgery;  Laterality: N/A;  . Clipping of atrial appendage Left 05/18/2012    Procedure: CLIPPING OF ATRIAL APPENDAGE;  Surgeon: [name], MD;  Location:  [place] [facility];  Service: Open Heart Surgery;  Laterality: Left;  . Coronary artery bypass graft N/A 05/18/2012    Procedure: CORONARY ARTERY BYPASS GRAFTING (CABG);  Surgeon: [name], MD;  Location: [place];  Service: Open Heart Surgery;  Laterality: N/A;  Times 4 using left internal mammary artery and endoscopically harvested right saphenous vein  . Left heart catheterization with coronary angiogram Bilateral 05/16/2012    Procedure: LEFT HEART CATHETERIZATION WITH CORONARY ANGIOGRAM;  Surgeon: Burnell Blanks, MD;  Location: [facility];  Service: Cardiovascular;  Laterality: Bilateral;    Family History  Problem Relation Age of Onset  . Coronary artery disease Mother   . Heart disease Mother   . Coronary  artery disease Sister   . Diabetes Sister   . Heart disease Sister 51    deceased  . Diabetes Father   . Heart disease Brother     Allergies  Allergen Reactions  . Ambien [Zolpidem Tartrate] Other (See Comments)    Super sensitive.   . Statins     REACTION: muscle aches...crestor  . Zolpidem Tartrate     Current Outpatient Prescriptions on File Prior to Visit  Medication Sig Dispense Refill  . aspirin EC 81 MG tablet Take 81 mg by mouth every evening.    . cyclobenzaprine (FLEXERIL) 5 MG tablet Take 1 tablet (5 mg total) by mouth 3 (three) times daily as needed for muscle spasms. 30 tablet 1  . MAGNESIUM-POTASSIUM PO Take by mouth.    . polyethylene glycol powder (GLYCOLAX/MIRALAX) powder TAKE 17 G BY MOUTH AS DIRECTED. 527 g 2  . simvastatin (ZOCOR) 20 MG tablet Take 1 tablet (20 mg total) by mouth at bedtime. 90 tablet 3  . testosterone cypionate (DEPOTESTOTERONE CYPIONATE) 200 MG/ML injection Inject into the muscle every 14 (fourteen) days.     No current facility-administered medications on file prior to visit.      Objective:   Physical Exam  Constitutional: He is oriented to person, place, and time. He appears well-developed and well-nourished. No distress.  BP 130/60 mmHg  Pulse 79  Temp(Src) 98.2 F (36.8 C) (Oral)  Resp 14  Ht 6\' 1"  (1.854 m)  Wt 179 lb 6.4 oz (81.375 kg)  BMI 23.67 kg/m2  SpO2 96%   HENT:  Head: Normocephalic and atraumatic.  Right Ear: External ear normal.  Left Ear: External ear normal.  Eyes: Right eye exhibits no discharge. Left eye exhibits no discharge. No scleral icterus.  Cardiovascular: Normal rate, regular rhythm, normal heart sounds and intact distal pulses.  Exam reveals no gallop and no friction rub.   No murmur heard. Pulmonary/Chest: Effort normal and breath sounds normal. No respiratory distress. He has no wheezes. He has no rales. He exhibits tenderness.  Rib and hip tenderness  Musculoskeletal: He exhibits tenderness. He  exhibits no edema.  Right hip and ribs  Neurological: He is alert and oriented to person, place, and time.  Skin: Skin is warm and dry. No rash noted. He is not diaphoretic.  Psychiatric: He has a normal mood and affect. His behavior is normal. Judgment and thought content normal.      Assessment & Plan:

## 2014-02-13 NOTE — Patient Instructions (Addendum)
Dr. Gilford Rile would like to see you in a month for regular visit.   Please make that appointment before leaving today.   Keep up the good work!

## 2014-02-13 NOTE — Progress Notes (Signed)
Pre visit review using our clinic review tool, if applicable. No additional management support is needed unless otherwise documented below in the visit note. 

## 2014-02-16 DIAGNOSIS — Z09 Encounter for follow-up examination after completed treatment for conditions other than malignant neoplasm: Secondary | ICD-10-CM | POA: Insufficient documentation

## 2014-02-16 NOTE — Assessment & Plan Note (Signed)
Stable. Pt fell on ice, s/p THA of right hip, Doing PT and has adequate home help at this time. Addressed medication concerns and pt has follow up with surgeon soon. No further questions or assistance needed at this time. FU with Dr. Gilford Rile for his regular 6 month follow up.

## 2014-02-25 ENCOUNTER — Encounter: Payer: Self-pay | Admitting: Orthopedic Surgery

## 2014-02-28 ENCOUNTER — Other Ambulatory Visit: Payer: Self-pay | Admitting: Internal Medicine

## 2014-03-01 ENCOUNTER — Other Ambulatory Visit: Payer: Self-pay | Admitting: *Deleted

## 2014-03-01 DIAGNOSIS — E785 Hyperlipidemia, unspecified: Secondary | ICD-10-CM

## 2014-03-01 MED ORDER — SIMVASTATIN 20 MG PO TABS: 20.0000 mg | ORAL_TABLET | Freq: Every day | ORAL | Status: DC

## 2014-03-02 ENCOUNTER — Other Ambulatory Visit: Payer: Self-pay | Admitting: Internal Medicine

## 2014-03-03 NOTE — Telephone Encounter (Signed)
Last OV with MD 8/15 but seen the NP for hospital follow up on 02/13/14 ok to fill?

## 2014-03-11 ENCOUNTER — Encounter
Admit: 2014-03-11 | Disposition: A | Discharge status: Home / Self Care | Payer: Self-pay | Attending: Orthopedic Surgery | Admitting: Orthopedic Surgery

## 2014-03-28 ENCOUNTER — Other Ambulatory Visit: Payer: Self-pay | Admitting: Internal Medicine

## 2014-04-02 ENCOUNTER — Ambulatory Visit (INDEPENDENT_AMBULATORY_CARE_PROVIDER_SITE_OTHER): Payer: PPO | Admitting: Internal Medicine

## 2014-04-02 ENCOUNTER — Other Ambulatory Visit: Payer: Self-pay | Admitting: *Deleted

## 2014-04-02 ENCOUNTER — Encounter: Payer: Self-pay | Admitting: Internal Medicine

## 2014-04-02 VITALS — BP 138/79 | HR 83 | Temp 97.8°F | Ht 73.0 in | Wt 173.2 lb

## 2014-04-02 DIAGNOSIS — G8929 Other chronic pain: Secondary | ICD-10-CM | POA: Insufficient documentation

## 2014-04-02 DIAGNOSIS — I700 Atherosclerosis of aorta: Secondary | ICD-10-CM | POA: Diagnosis not present

## 2014-04-02 DIAGNOSIS — I10 Essential (primary) hypertension: Secondary | ICD-10-CM | POA: Diagnosis not present

## 2014-04-02 DIAGNOSIS — E785 Hyperlipidemia, unspecified: Secondary | ICD-10-CM | POA: Diagnosis not present

## 2014-04-02 DIAGNOSIS — I4891 Unspecified atrial fibrillation: Secondary | ICD-10-CM | POA: Diagnosis not present

## 2014-04-02 DIAGNOSIS — M549 Dorsalgia, unspecified: Secondary | ICD-10-CM

## 2014-04-02 LAB — CBC WITH DIFFERENTIAL/PLATELET
BASOS ABS: 0 10*3/uL (ref 0.0–0.1)
Basophils Relative: 0.4 % (ref 0.0–3.0)
EOS ABS: 0.2 10*3/uL (ref 0.0–0.7)
EOS PCT: 2.3 % (ref 0.0–5.0)
HCT: 41.8 % (ref 39.0–52.0)
Hemoglobin: 13.7 g/dL (ref 13.0–17.0)
LYMPHS ABS: 1.1 10*3/uL (ref 0.7–4.0)
Lymphocytes Relative: 14.4 % (ref 12.0–46.0)
MCHC: 32.8 g/dL (ref 30.0–36.0)
MCV: 82.5 fl (ref 78.0–100.0)
MONO ABS: 0.6 10*3/uL (ref 0.1–1.0)
Monocytes Relative: 8 % (ref 3.0–12.0)
NEUTROS PCT: 74.9 % (ref 43.0–77.0)
Neutro Abs: 5.8 10*3/uL (ref 1.4–7.7)
PLATELETS: 272 10*3/uL (ref 150.0–400.0)
RBC: 5.07 Mil/uL (ref 4.22–5.81)
RDW: 13.3 % (ref 11.5–15.5)
WBC: 7.7 10*3/uL (ref 4.0–10.5)

## 2014-04-02 LAB — LIPID PANEL
CHOL/HDL RATIO: 4
Cholesterol: 148 mg/dL (ref 0–200)
HDL: 37.2 mg/dL — AB (ref >39.00)
LDL CALC: 91 mg/dL (ref 0–99)
NONHDL: 110.8
Triglycerides: 99 mg/dL (ref 0.0–149.0)
VLDL: 19.8 mg/dL (ref 0.0–40.0)

## 2014-04-02 LAB — COMPREHENSIVE METABOLIC PANEL
ALK PHOS: 155 U/L — AB (ref 39–117)
ALT: 16 U/L (ref 0–53)
AST: 17 U/L (ref 0–37)
Albumin: 4.2 g/dL (ref 3.5–5.2)
BILIRUBIN TOTAL: 0.6 mg/dL (ref 0.2–1.2)
BUN: 18 mg/dL (ref 6–23)
CHLORIDE: 104 meq/L (ref 96–112)
CO2: 29 mEq/L (ref 19–32)
Calcium: 9.3 mg/dL (ref 8.4–10.5)
Creatinine, Ser: 1.11 mg/dL (ref 0.40–1.50)
GFR: 67.86 mL/min (ref >60.00)
Glucose, Bld: 94 mg/dL (ref 70–99)
Potassium: 4 mEq/L (ref 3.5–5.1)
Sodium: 137 mEq/L (ref 135–145)
TOTAL PROTEIN: 7.1 g/dL (ref 6.0–8.3)

## 2014-04-02 MED ORDER — LIDOCAINE 5 % EX PTCH: 1.0000 | MEDICATED_PATCH | CUTANEOUS | Status: DC

## 2014-04-02 MED ORDER — CYCLOBENZAPRINE HCL 5 MG PO TABS: 5.0000 mg | ORAL_TABLET | Freq: Three times a day (TID) | ORAL | Status: DC

## 2014-04-02 MED ORDER — SIMVASTATIN 20 MG PO TABS: 20.0000 mg | ORAL_TABLET | Freq: Every day | ORAL | Status: DC

## 2014-04-02 NOTE — Patient Instructions (Signed)
Try using Tramadol 100mg  up to three times daily for back or hip pain.  Follow up in 4 weeks.

## 2014-04-02 NOTE — Assessment & Plan Note (Signed)
Chronic back pain. Worsened by fall this winter with fracture of right hip and need for hip replacement. Now having aching pain in mid and lower back and right hip. Will restart Tramadol. Add Lidocaine patch for focal area of pain in right mid back. Reviewed lumbar xrays which showed no acute fracture. Discussed evaluation with Dr. Sharlet Salina, however he would like to hold off on this for now. Follow up in 4 weeks and prn.

## 2014-04-02 NOTE — Assessment & Plan Note (Signed)
Chronic abdominal aortic atherosclerosis noted as far back as 2013. Will set up repeat US of the abdominal aorta through cardiology. Continue Simvastatin.

## 2014-04-02 NOTE — Assessment & Plan Note (Signed)
Will check lipids and LFTs  with labs today. Continue Simvastatin. 

## 2014-04-02 NOTE — Assessment & Plan Note (Signed)
Continues to maintain NSR. No recent CP, palpitations. No changes made to meds.

## 2014-04-02 NOTE — Assessment & Plan Note (Signed)
BP Readings from Last 3 Encounters:  04/02/14 138/79  02/13/14 130/60  09/25/13 130/82   BP well controlled off medication. Will check renal function with labs today.

## 2014-04-02 NOTE — Progress Notes (Signed)
Pre visit review using our clinic review tool, if applicable. No additional management support is needed unless otherwise documented below in the visit note. 

## 2014-04-02 NOTE — Progress Notes (Signed)
Subjective:    Patient ID: [name], male    DOB: 05-02-1932, 81 y.o.   MRN: [id number]  HPI  81YO male presents for follow up.  Planning to start cardiopulm rehab at [facility]. Completed PT at home and as outpatient. S/p right hip replacement. Continues to have some aching pain in right hip and lower back. Was previously taking Oxycodone, but stopped because of constipation. Taking occasional Flexeril with minimal improvement. Has Tramadol at home but has not been using it. Lumbar spine xray from 02/2014 showed mild degenerative changes, but no acute process. He is not interested in interventions such as ESI at this time.  His wife is concerned as abdominal aortic atherosclerosis was seen on lumbar xray. He has had CT abdomen and [place] in the past, 2013, which showed no aneurysm.  Past medical, surgical, family and social history per today's encounter.  Review of Systems  Constitutional: Negative for fever, chills, activity change, appetite change, fatigue and unexpected weight change.  Eyes: Negative for visual disturbance.  Respiratory: Negative for cough and shortness of breath.   Cardiovascular: Negative for chest pain, palpitations and leg swelling.  Gastrointestinal: Positive for constipation. Negative for nausea, vomiting, abdominal pain, diarrhea and abdominal distention.  Genitourinary: Negative for dysuria, urgency and difficulty urinating.  Musculoskeletal: Positive for myalgias, back pain and arthralgias. Negative for gait problem.  Skin: Negative for color change and rash.  Hematological: Negative for adenopathy.  Psychiatric/Behavioral: Negative for sleep disturbance and dysphoric mood. The patient is not nervous/anxious.        Objective:    BP 138/79 mmHg  Pulse 83  Temp(Src) 97.8 F (36.6 C) (Oral)  Ht 6\' 1"  (1.854 m)  Wt 173 lb 4 oz (78.586 kg)  BMI 22.86 kg/m2  SpO2 99% Physical Exam  Constitutional: He is oriented to person, place, and time. He appears  well-developed and well-nourished. No distress.  HENT:  Head: Normocephalic and atraumatic.  Right Ear: External ear normal.  Left Ear: External ear normal.  Nose: Nose normal.  Mouth/Throat: Oropharynx is clear and moist. No oropharyngeal exudate.  Eyes: Conjunctivae and EOM are normal. Pupils are equal, round, and reactive to light. Right eye exhibits no discharge. Left eye exhibits no discharge. No scleral icterus.  Neck: Normal range of motion. Neck supple. No tracheal deviation present. No thyromegaly present.  Cardiovascular: Normal rate, regular rhythm and normal heart sounds.  Exam reveals no gallop and no friction rub.   No murmur heard. Pulmonary/Chest: Effort normal and breath sounds normal. No accessory muscle usage. No tachypnea. No respiratory distress. He has no decreased breath sounds. He has no wheezes. He has no rhonchi. He has no rales. He exhibits no tenderness.  Musculoskeletal: Normal range of motion. He exhibits no edema.       Back:  Lymphadenopathy:    He has no cervical adenopathy.  Neurological: He is alert and oriented to person, place, and time. No cranial nerve deficit. Coordination normal.  Skin: Skin is warm and dry. No rash noted. He is not diaphoretic. No erythema. No pallor.  Psychiatric: He has a normal mood and affect. His behavior is normal. Judgment and thought content normal.          Assessment & Plan:   Problem List Items Addressed This Visit      Unprioritized   Aortic atherosclerosis - Primary    Chronic abdominal aortic atherosclerosis noted as far back as 2013. Will set up repeat US of the abdominal aorta through  cardiology. Continue Simvastatin.      ATRIAL FIBRILLATION    Continues to maintain NSR. No recent CP, palpitations. No changes made to meds.      Relevant Orders   CBC with Differential/Platelet   Comprehensive metabolic panel   Chronic back pain    Chronic back pain. Worsened by fall this winter with fracture of right  hip and need for hip replacement. Now having aching pain in mid and lower back and right hip. Will restart Tramadol. Add Lidocaine patch for focal area of pain in right mid back. Reviewed lumbar xrays which showed no acute fracture. Discussed evaluation with Dr. Sharlet Salina, however he would like to hold off on this for now. Follow up in 4 weeks and prn.      Hyperlipidemia    Will check lipids and LFTs with labs today. Continue Simvastatin.      Relevant Orders   Lipid panel   HYPERTENSION, BENIGN    BP Readings from Last 3 Encounters:  04/02/14 138/79  02/13/14 130/60  09/25/13 130/82   BP well controlled off medication. Will check renal function with labs today.           Return in about 3 months (around 07/03/2014) for Recheck.

## 2014-04-08 ENCOUNTER — Telehealth: Payer: Self-pay

## 2014-04-08 DIAGNOSIS — I700 Atherosclerosis of aorta: Secondary | ICD-10-CM

## 2014-04-08 NOTE — Telephone Encounter (Signed)
-----   Message from Minna Merritts, MD sent at 04/07/2014  6:43 PM EDT ----- We can order an u/s here. I will take care of it.   Mandi,  Can you help me order an aorta u/s for aortic atherosclerosis? (don't you love the way I handled that! So smooth!) ;-) Tim  ----- Message -----    From: Jackolyn Confer, MD    Sent: 04/02/2014   9:29 AM      To: Minna Merritts, MD  Tim, He was found to have abdominal aortic atherosclerosis on xray. Can you check an abdominal aortic US in your office?

## 2014-04-08 NOTE — Telephone Encounter (Signed)
Called pt to set up u/s appt.  He states that he would prefer to keep appt w/ Dr. Rockey Situ and discuss further at that time.

## 2014-04-11 ENCOUNTER — Encounter
Admit: 2014-04-11 | Disposition: A | Discharge status: Home / Self Care | Payer: Self-pay | Attending: Orthopedic Surgery | Admitting: Orthopedic Surgery

## 2014-04-16 ENCOUNTER — Encounter: Payer: Self-pay | Admitting: Cardiovascular Disease

## 2014-04-16 ENCOUNTER — Ambulatory Visit (INDEPENDENT_AMBULATORY_CARE_PROVIDER_SITE_OTHER): Payer: PPO | Admitting: Cardiovascular Disease

## 2014-04-16 ENCOUNTER — Telehealth: Payer: Self-pay | Admitting: *Deleted

## 2014-04-16 VITALS — BP 120/70 | HR 80 | Ht 73.0 in | Wt 174.0 lb

## 2014-04-16 DIAGNOSIS — I4891 Unspecified atrial fibrillation: Secondary | ICD-10-CM | POA: Diagnosis not present

## 2014-04-16 DIAGNOSIS — Z951 Presence of aortocoronary bypass graft: Secondary | ICD-10-CM

## 2014-04-16 DIAGNOSIS — E785 Hyperlipidemia, unspecified: Secondary | ICD-10-CM

## 2014-04-16 DIAGNOSIS — I2510 Atherosclerotic heart disease of native coronary artery without angina pectoris: Secondary | ICD-10-CM | POA: Diagnosis not present

## 2014-04-16 DIAGNOSIS — I10 Essential (primary) hypertension: Secondary | ICD-10-CM

## 2014-04-16 DIAGNOSIS — G8929 Other chronic pain: Secondary | ICD-10-CM

## 2014-04-16 DIAGNOSIS — M549 Dorsalgia, unspecified: Secondary | ICD-10-CM

## 2014-04-16 MED ORDER — SIMVASTATIN 40 MG PO TABS: 40.0000 mg | ORAL_TABLET | Freq: Every day | ORAL | Status: DC

## 2014-04-16 NOTE — Assessment & Plan Note (Signed)
Blood pressure is well controlled on today's visit. No changes made to the medications. 

## 2014-04-16 NOTE — Telephone Encounter (Signed)
Pt called in, asking if he could "drop in" for a tetanus shot. Asked if it had been 10 years and he said it hadn't been that long since his last one. States he has a cut on his finger that is swollen and red and is unsure if he needs one or not. Advised he would need to be seen first in an appointment to evaluate need for tetanus and to check for infection. Pt declined to schedule appt, states he sees Dr. Rockey Situ today and will call back after that appt.

## 2014-04-16 NOTE — Assessment & Plan Note (Signed)
Currently with no symptoms concerning for angina. Encouraged him to restart his exercise regimen

## 2014-04-16 NOTE — Progress Notes (Signed)
Patient ID: [name], male    DOB: [birth year], 81 y.o.   MRN: [id number]  HPI Comments: [name] is a very pleasant 81 year old gentleman with a history of coronary artery disease, bypass surgery may 2014 . Also atrial fibrillation, previously on amiodarone He had a stent placed to his LAD in 1993, stent placed to his mid RCA in 2003, in-stent restenosis with repeat stent placed to his LAD in September 08, stent placed to his RCA at the same time, stress test in February 2009 and Jan 2014 which showed no ischemia,  history of low testosterone .  He presents for routine followup of hiscoronary artery disease  He reports having recovered from his hip surgery. Now with chronic back pain, worse when he sleeps. Also has problems with constipation, worse on pain medication Back pain is better on tramadol. Occasionally needs Flexeril. States that he is reinitiating his workout at [employer] in the hospital. Excited to start working out again. Denies any chest pain or shortness of breath exertion  EKG on today's visit shows normal sinus rhythm with rate 80 bpm, no significant ST or T-wave changes Review of the blood work with him shows climb in cholesterol and LDL since last year, likely from weight gain. Tolerating simvastatin 20 mg daily  Other past medical history Previous episode of dizziness in 2015 dizziness. He was unable to get out of bed without assistance. Dizziness occurred while supine as well as sitting and standing. He went to the emergency room, was told that he could be dehydrated and was given IV fluids x2 bags. This seemed to improve his symptoms and was discharged home. Other workup in the hospital was negative including negative cardiac enzymes, normal basic metabolic panel, normal LFTs, normal EKG  Previously had chest pain in may 2014. Presented to [facility], cardiac catheterization showing subtotal occlusion of his LAD stent, in-stent restenosis, severe OM1 and OM 2 disease also  with RCA disease,  On  05/18/2012.  He underwent CABG x 4 utilizing LIMA to LAD, SVG to OM1, SVG to OM2, and SVG to PDA.  He also underwent Complete MAZE procedure with Clipping of Left Atrial Appendage.    He did not tolerate beta blockers or ACE inhibitor he had malaise, bradycardia. He took himself off these medications.   In  January 2014 he  had an episode of atrial fibrillation and presented to the emergency room. Converted back to normal sinus rhythm. Discharged on pradaxa. Recurrent episodes of atrial fibrillation shortly after that.   Allergies  Allergen Reactions  . Ambien [Zolpidem Tartrate] Other (See Comments)    Super sensitive.   . Statins     REACTION: muscle aches...crestor  . Zolpidem Tartrate     Outpatient Encounter Prescriptions as of 04/16/2014  Medication Sig  . aspirin EC 81 MG tablet Take 81 mg by mouth every evening.  . cyclobenzaprine (FLEXERIL) 5 MG tablet Take 1 tablet (5 mg total) by mouth 3 (three) times daily. (Patient taking differently: Take 5 mg by mouth 3 (three) times daily as needed. )  . MAGNESIUM-POTASSIUM PO Take by mouth.  . polyethylene glycol powder (GLYCOLAX/MIRALAX) powder TAKE 17 G BY MOUTH AS DIRECTED.  [name] simvastatin (ZOCOR) 40 MG tablet Take 1 tablet (40 mg total) by mouth at bedtime.  [name] testosterone cypionate (DEPOTESTOTERONE CYPIONATE) 200 MG/ML injection Inject into the muscle every 14 (fourteen) days.  . traMADol (ULTRAM) 50 MG tablet Take by mouth every 6 (six) hours as needed.  . [  DISCONTINUED] simvastatin (ZOCOR) 20 MG tablet Take 1 tablet (20 mg total) by mouth at bedtime.  . [DISCONTINUED] lidocaine (LIDODERM) 5 % Place 1 patch onto the skin daily. Remove & Discard patch within 12 hours or as directed by MD (Patient not taking: Reported on 04/16/2014)    Past Medical History  Diagnosis Date  . Coronary artery disease     a. 1993 s/p PCI/BMS LAD;  b. 2003 PCI RCA;  c. 09/2006 ISR LAD->PCI, PCI RCA;  d. 02/2007 MV: no ischemia;  d.  01/2012 MV: EF 74%, no ischemia.  . Hyperlipidemia   . Hypertension   . Hyperthyroidism   . PAF (paroxysmal atrial fibrillation)     a. previously on amio ->stopped in 2013;  b. recurrent PAF 01/2012->Pradaxa and amio initiated, subsequently came off of pradaxa 2/2 cost;  c. 02/2012 Echo: EF 55-60%, Gr 1 DD.  [name] Low testosterone   . History of prostate cancer     Past Surgical History  Procedure Laterality Date  . Cardiac catheterization  2008  . Prostatectomy    . Hernia repair      s/p mesh bilaterally, Dr. Bary Castilla  . Appendectomy      Dr. Bary Castilla  . Colonoscopy  2013  . Intraoperative transesophageal echocardiogram N/A 05/18/2012    Procedure: INTRAOPERATIVE TRANSESOPHAGEAL ECHOCARDIOGRAM;  Surgeon: [name], MD;  Location: [place];  Service: Open Heart Surgery;  Laterality: N/A;  . Maze N/A 05/18/2012    Procedure: MAZE;  Surgeon: [name], MD;  Location: [place];  Service: Open Heart Surgery;  Laterality: N/A;  . Clipping of atrial appendage Left 05/18/2012    Procedure: CLIPPING OF ATRIAL APPENDAGE;  Surgeon: [name], MD;  Location: [place];  Service: Open Heart Surgery;  Laterality: Left;  . Coronary artery bypass graft N/A 05/18/2012    Procedure: CORONARY ARTERY BYPASS GRAFTING (CABG);  Surgeon: [name], MD;  Location: [place];  Service: Open Heart Surgery;  Laterality: N/A;  Times 4 using left internal mammary artery and endoscopically harvested right saphenous vein  . Left heart catheterization with coronary angiogram Bilateral 05/16/2012    Procedure: LEFT HEART CATHETERIZATION WITH CORONARY ANGIOGRAM;  Surgeon: Burnell Blanks, MD;  Location: [facility] CATH LAB;  Service: Cardiovascular;  Laterality: Bilateral;  . Total hip arthroplasty Right     Dr. [name]    Social History  reports that he has never smoked. He quit smokeless tobacco use about 22 years ago. His smokeless tobacco use included Chew. He reports that he drinks about 0.5 oz of alcohol per week. He  reports that he does not use illicit drugs.  Family History family history includes Coronary artery disease in his mother and sister; Diabetes in his father and sister; Heart disease in his brother and mother; Heart disease (age of onset: 36) in his sister.   Review of Systems  Constitutional: Negative.   Respiratory: Negative.   Cardiovascular: Negative.   Gastrointestinal: Negative.   Musculoskeletal: Negative.        Sore tailbone  Skin: Negative.   Neurological: Negative.   Hematological: Negative.   Psychiatric/Behavioral: Negative.   All other systems reviewed and are negative.  BP 120/70 mmHg  Pulse 80  Ht 6\' 1"  (1.854 m)  Wt 174 lb (78.926 kg)  BMI 22.96 kg/m2  Physical Exam  Constitutional: He is oriented to person, place, and time. He appears well-developed and well-nourished.  HENT:  Head: Normocephalic.  Nose: Nose normal.  Mouth/Throat: Oropharynx  is clear and moist.  Eyes: Conjunctivae are normal. Pupils are equal, round, and reactive to light.  Neck: Normal range of motion. Neck supple. No JVD present.  Cardiovascular: Normal rate, regular rhythm, S1 normal, S2 normal, normal heart sounds and intact distal pulses.  Exam reveals no gallop and no friction rub.   No murmur heard. Well healed mediastinal scar  Pulmonary/Chest: Effort normal and breath sounds normal. No respiratory distress. He has no wheezes. He has no rales. He exhibits no tenderness.  Abdominal: Soft. Bowel sounds are normal. He exhibits no distension. There is no tenderness.  Musculoskeletal: Normal range of motion. He exhibits no edema or tenderness.  Lymphadenopathy:    He has no cervical adenopathy.  Neurological: He is alert and oriented to person, place, and time. Coordination normal.  Skin: Skin is warm and dry. No rash noted. No erythema.  Psychiatric: He has a normal mood and affect. His behavior is normal. Judgment and thought content normal.      Assessment and Plan   Nursing  note and vitals reviewed.

## 2014-04-16 NOTE — Assessment & Plan Note (Signed)
Currently with no symptoms of angina. No further workup at this time. Continue current medication regimen. 

## 2014-04-16 NOTE — Patient Instructions (Signed)
You are doing well.  Please increase the simvastatin from 20 mg up to 40 mg daily  Please call us if you have new issues that need to be addressed before your next appt.  Your physician wants you to follow-up in: 6 months.  You will receive a reminder letter in the mail two months in advance. If you don't receive a letter, please call our office to schedule the follow-up appointment.

## 2014-04-16 NOTE — Assessment & Plan Note (Signed)
Maintaining normal sinus rhythm. No changes to his medications. In the past did not tolerate beta blockers or calcium channel blockers

## 2014-04-16 NOTE — Assessment & Plan Note (Signed)
Pain tolerable with tramadol, occasionally uses Flexeril when available Restarted his workout at the gym

## 2014-04-16 NOTE — Assessment & Plan Note (Signed)
We will increase his simvastatin up to 40 mg daily. He was unable to afford zetia in the past. LDL above goal, currently 90

## 2014-04-18 ENCOUNTER — Telehealth: Payer: Self-pay

## 2014-04-18 NOTE — Telephone Encounter (Signed)
Abdominal aorta likely not needed In 2013, aorta was normal size 2.7 cm Typically does not progress rapidly, takes years to become aneurysm There was significant atherosclerosis though. Can treat this with medications

## 2014-04-18 NOTE — Telephone Encounter (Signed)
Left message on pt's vm w/ Dr. Donivan Scull recommendation.  Asked him to call back w/ any questions or concerns.

## 2014-04-18 NOTE — Telephone Encounter (Signed)
Do you still want him to have this?

## 2014-04-18 NOTE — Telephone Encounter (Signed)
Called pt to schedule abdominal aorta, and pt states, " I dont know why we need to do that, I dont want to schedule".

## 2014-05-02 NOTE — H&P (Signed)
PATIENT NAME:  [name], [name] MR#:  [id number] DATE OF BIRTH:  08-04-1932  DATE OF ADMISSION:  05/15/2012  REFERRING PHYSICIAN: Dr. Pollie Friar.   PRIMARY CARE PHYSICIAN: Dr. Ronette Deter.   PRIMARY CARDIOLOGIST: Dr. Rockey Situ.   CHIEF COMPLAINT: Chest pain.   HISTORY OF PRESENT ILLNESS: This is a 81 year old male with significant past medical history of coronary artery disease, atrial fibrillation, hyperlipidemia. Presents with complaint of chest pain. The patient has been following with Dr. Rockey Situ regularly. The patient had recent stress test in January of this year. Had a Lexiscan done due to complaints of atrial fibrillation and chest discomfort, where his Lexiscan essentially was low risk, but the patient denies having any chest pain then. The patient currently reports he is having chest pain. He did not have this chest pain for the last few years. It reminds him of his chest pain when he had his angina in 2008 where he required a stent to be placed. He reports his chest pain is mid sternal, sharp in quality, nonradiating. It happened at rest, relieved with nitroglycerin. It happened twice today. Relieved with sublingual nitroglycerin. He denied any accompanying symptoms like shortness of breath, nausea, vomiting, palpitations or diaphoresis. In the ED, the patient had negative troponin x1. His EKG does not show any significant ST or T wave changes. It was only significant for bradycardia with heart rate at 52. Given the patient's symptoms, including chest pain that resembles his last heart attack, as well it was relieved with nitroglycerin, he received 324 of aspirin and subcutaneous Lovenox treatment dose out of concern for unstable angina. Currently, the patient reports he is chest pain-free. Hospitalist service requested to admit the patient for further workup of his chest pain.   PAST MEDICAL HISTORY:  1. Coronary artery disease.  2. Hypertension.  3. Prostate cancer, status post  prostatectomy.  4. 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  5. Atrial fibrillation.   PAST SURGICAL HISTORY:  1. Appendectomy.  2. Prostatectomy.  3. Bilateral hernia repair.  4. History of stent placement.   SOCIAL HISTORY: The patient used to smoke cigars, quit in 1993. Drinks margarita occasionally. Drinks 1 small glass of wine at dinner. The patient lives at home with his wife.   FAMILY HISTORY: Significant for diabetes in his father and coronary artery disease in the family.   ALLERGIES:  1. AMBIEN.  2. CRESTOR.  3. LIPITOR.  TOPROL-XL.  [place].   HOME MEDICATIONS:  1. Aspirin 81 mg daily.  2. Amiodarone 200 mg oral daily.  3. Vytorin 10/20 mg oral 1/2 tablet daily.  4. Plavix 75 mg oral daily.  5. MiraLAX as needed.  6. AndroGel apply topically at bedtime.   REVIEW OF SYSTEMS:  CONSTITUTIONAL: The patient denies fever, chills, fatigue, weakness, weight gain, weight loss.  EYES: Denies blurry vision, double vision, pain, inflammation.  ENT: Denies tinnitus, ear pain, hearing loss or epistaxis.  RESPIRATORY: Denies cough, wheezing, hemoptysis, dyspnea, COPD.   CARDIOVASCULAR: Denies any edema, arrhythmia, palpitations, syncope, orthopnea, dyspnea on exertion. Has complaints of chest pain, midsternal, sharp quality, at rest, relieved with sublingual nitroglycerin.  GASTROINTESTINAL: Denies nausea, vomiting, diarrhea, abdominal pain, hematemesis, melena, jaundice, rectal bleed or constipation.  GENITOURINARY: Denies dysuria, hematuria, renal colic.  ENDOCRINE: Denies polyuria, polydipsia, heat or cold intolerance.  HEMATOLOGY: Denies anemia, easy bruising, bleeding diathesis.  INTEGUMENTARY: Denies acne, rash or skin lesions.  MUSCULOSKELETAL: Denies any arthritis, cramps, gout or redness.  NEUROLOGIC: Denies numbness, dysarthria, epilepsy, tremors, vertigo, CVA, TIA or seizures.  PSYCHIATRIC: Denies anxiety, insomnia,  schizophrenia, substance or alcohol abuse.   PHYSICAL EXAM:  VITAL SIGNS: Temperature 97.7, pulse 48, respiratory rate 22, blood pressure 158/67, saturating 99% on room air.  GENERAL: Well-nourished male who looks comfortable in bed, in no apparent distress.  HEENT: Head atraumatic, normocephalic. Pupils equal, reactive to light. Pink conjunctivae. Anicteric sclerae. Moist oral mucosa.  NECK: Supple. No thyromegaly. No JVD.  CHEST: Good air entry bilaterally. No wheezing, rales, rhonchi. No chest tenderness to palpation.  CARDIOVASCULAR: S1, S2 heard. No rubs, murmurs or gallops. Regular rhythm. Mildly bradycardic.  ABDOMEN: Soft, nontender, nondistended. Bowel sounds present.  EXTREMITIES: No edema. No clubbing. No cyanosis. Dorsalis pedis and tibialis pulses felt bilaterally +2.  NEUROLOGIC: Cranial nerves grossly intact. Motor 5 out of 5. No focal deficits.  PSYCHIATRIC: Appropriate affect. Awake, alert x3. Intact judgment and insight.  SKIN: Normal skin turgor. Warm and dry.   PERTINENT LABS: Glucose 124, BUN 23, creatinine 1.37, sodium 140, potassium 4.3, chloride 108, CO2 26. Troponin less than 0.02.   White blood cells 6.9, hemoglobin 13.2, hematocrit 39.2, platelets 195. INR 1.   EKG showing normal sinus rhythm without significant ST or T wave changes, with ventricular rate of 53.   ASSESSMENT AND PLAN:  1. Chest pain: The patient is known to have history of coronary artery disease, but he had a negative stress test. Lexiscan in January of this year, but given the fact his chest pain resembles the chest pain prior to the last heart attack as well as it resolved with sublingual nitroglycerin, there is suspicion for unstable angina. The patient received 1 dose of subcutaneous Lovenox treatment dose in the Emergency Department. He received 324 of aspirin. Already on aspirin and Vytorin. Has allergy to BETA BLOCKERS causing bradycardia. As well, he is already bradycardic, so cannot give any  beta blockers. Will check lipid panel. Will admit the patient to telemetry unit. Will continue to cycle his cardiac enzymes. Will consult cardiology, Dr. Rockey Situ, who is familiar with the patient. Will keep the patient n.p.o. in case cardiology is planning to proceed with further testing or procedures in a.m.  2. History of hypertension: The patient is not taking any home meds for hypertension. Reports his blood pressure is controlled as an outpatient. Will continue to monitor.  3. Hyperlipidemia: Will check lipid panel. Will continue with Vytorin.  4. Atrial fibrillation: The patient currently in normal sinus rhythm. Continue with amiodarone.  5. Coronary artery disease: Continue with aspirin, Plavix and Vytorin.  6. Deep vein thrombosis prophylaxis: The patient received 1 dose of subcutaneous Lovenox treatment dose. At this point, will hold on any further chemical anticoagulation for deep vein thrombosis prophylaxis. Continue with sequential compression device.   CODE STATUS: FULL CODE.   TOTAL TIME SPENT ON ADMISSION AND PATIENT CARE: 55 minutes.   ____________________________ Albertine Patricia, MD dse:gb D: 05/15/2012 00:14:26 ET T: 05/15/2012 02:17:06 ET JOB#: [id number]  cc: Albertine Patricia, MD, <Dictator> Erik Burkett Graciela Husbands MD ELECTRONICALLY SIGNED 05/17/2012 0:11

## 2014-05-02 NOTE — Discharge Summary (Signed)
PATIENT NAME:  [name], [name] MR#:  [id number] DATE OF BIRTH:  09-26-1932  DATE OF ADMISSION:  05/15/2012 DATE OF DISCHARGE:  05/15/2012  ADMITTING DIAGNOSIS:  Chest pain.   DISCHARGE DIAGNOSES:   1.  Chest pain, predominantly postprandial chest pain, likely gastrointestinal in origin.  2.  Stable angina.  3.  Bradycardia.  4.  History of hypertension.  5.  Coronary artery disease.  6.  Prostate carcinoma. 7.  Hyperlipidemia. 8.  Hyperthyroidism. 9.  Atrial fibrillation, in sinus rhythm now.   DISCHARGE CONDITION:  Stable.   DISCHARGE MEDICATIONS:  The patient is to continue amiodarone 200 mg by mouth once daily, aspirin 81 mg by mouth once daily, Plavix 75 mg by mouth daily, polyethylene glycol one packet once daily as needed, AndroGel topically once daily at bedtime as well as two pumps transdermally once daily in the morning, Vytorin 10/40 half tablet once at bedtime.  New medication isosorbide mononitrate 30 mg by mouth once daily, nitroglycerin 0.4 mg sublingual every 5 minutes as needed and pantoprazole 40 mg by mouth once daily.   HOME OXYGEN:  None.   DIET:  2 grams salt, low fat, low cholesterol, mechanical soft diet.   ACTIVITY LIMITATIONS:  As tolerated.    FOLLOW-UP APPOINTMENT:  With Dr. Ronette Deter in two days after discharge, as well as [place] [facility] in one week after discharge.   CONSULTANTS:  [facility], PA, Danella Sensing.   RADIOLOGIC STUDIES:  Chest x-ray, PA and lateral, 05/15/2012, showed no acute cardiopulmonary disease.   HOSPITAL COURSE:  The patient is a 81 year old male with past medical history significant for history of hypertension, hyperlipidemia who presented to the hospital with complaints of chest pains.  Apparently he had recent stress test in January 2014 which was unremarkable.  He presented to the hospital with midsternal sharp in quality, nonradiating pain, which happened at rest after he ate food and relieved by nitroglycerin.   It happened twice on the day of admission 05/15/2012.  He denied any other symptoms.  He however admitted of having some stable angina symptoms which they usually come on exertion and they are different in quality.  On arrival to the Emergency Room, the patient's temperature was 97.7, pulse was 48, respiratory rate was 22, blood pressure 158/67, saturation was 99% on room air.  Physical exam was unremarkable.  EKG showed normal sinus rhythm with no significant ST-T changes and ventricular rate was low at 53.  The patient's lab data done in Emergency Room 05/14/2012, revealed mild elevation of creatinine as well as BUN 1.37 and 23, respectively.  Glucose was slightly elevated to 124, otherwise BMP was unremarkable.  The patient's cardiac enzymes x 3 were normal.  TSH was normal at 4.05.  CBC was within normal limits with white blood cell count 6.9, hemoglobin 13.2, platelet count 195.  Coagulation panel was within normal limits.  EKG showed sinus brady at 52 beats per minute, first degree AV block, otherwise no significant changes were noted.  Chest x-ray was normal.  The patient was admitted to the hospital for further evaluation.  He was initiated on some IV fluids because of concern of mild renal insufficiency.  His cardiac enzymes were checked.  He was started on nitroglycerin topically and cardiology consultation with [facility] was obtained.  PA, Danella Sensing saw patient in consultation on 05/15/2012.  He felt that the patient's precordial chest pain suspect more acute episode of substernal discomfort after meals with belching and relieved by nitroglycerin  very likely gastroesophageal reflux disease, esophageal etiology.  He recommended to start the patient on acid-reducing medication such as proton pump inhibitor.  His chronic however substernal chest discomfort (Dictation Anomaly) ambulation in the yard and relieved at rest, and has been present for months according to cardiology and did not change  in severity or frequency and likely represents stable angina.  He had normal EKG in the Emergency Room.  He had normal cardiac enzymes.  He had also a nuclear stress test in January 2014 which showed no ischemia and preserved ventricular ejection fraction.  He recommended to add Imdur 30 mg by mouth daily dose and follow up with cardiology in the next 1 to 2 weeks after discharge.  Hold metoprolol due to bradycardia.  For history of coronary artery disease as well as stable angina recommended to continue aspirin, Plavix, Vytorin as well as Imdur and nitroglycerin sublingually as needed.  If however patient's exertional chest becomes more frequent or severe despite conservative medical management and gastroesophageal reflux disease treatment he recommended to do repeat diagnostic cardiac catheterization.  In regards to fatigue as well as weight loss, it was recommended that he follow up with primary care physician as well as check TSH and consider nutritionist as well as outpatient home health physical therapy, however looking into patient's significant bradycardia, I was concerned that patient's bradycardia could be causing significant fatigue, especially if patient's bradycardia does not change with exertion.  It could be that patient has functional bradycardia if he is not able to mount adequate response to exertion and that is why this got called anginal symptoms as well as his fatigue.  In regards to paroxysmal atrial fibrillation, cardiology noted that patient is maintaining normal sinus rhythm while inpatient.  He is to take further low dose of amiodarone.  He recommended to hold off beta-blockers due to bradycardia and continue aspirin and Plavix for thromboembolic protection and continue low dose amiodarone.  Dr. Mertie Moores saw patient in consultation as addendum.  He also agreed with above findings, however, he noted that the patient should start a regular walking program and continue Imdur.  However, he  also added the patient may need to have his amiodarone decreased to 100 mg by mouth daily dose.  In regards to hyperlipidemia, the patient's lipid panel was checked and LDL was found to be 68.  The patient's total cholesterol was noted to be 123, triglycerides were 53 and HDL was 44.  It was felt that patient is maintaining good cholesterol control.  Continue antihyperlipidemic medications according to cardiology.  In regards to hypertension, cardiology noted that patient's blood pressure was mildly elevated on the morning of admission.  They recommended to add Imdur to have some effect on blood pressure and also consider adding ACE inhibitor if patient's blood pressure remains elevated on follow-up.  We were not able to add ACE inhibitor due to mild renal insufficiency, however now since patient was rehydrated his kidney function improved on repeated testing.  I felt that the patient would benefit from addendum of ACE inhibitor such as low dose of lisinopril.  As mentioned above, the patient's renal function was slightly abnormal with elevated BUN and creatinine to 23 and 1.37 on 05/15/2012, however with rehydration patient's kidney function improved.  On day of discharge, 05/15/2012, the patient's BUN and creatinine were 18 and 1.16.  It is recommended to follow closely his kidney function as well as his blood pressure readings and make decisions about initiation ACE inhibitor for  this patient.  The patient is being discharged in stable condition with the above-mentioned medications and follow-up.    His vital signs on the day of discharge:  Temperature was 97.7, pulse was in the 50s and respiratory rate was 16, blood pressure 141/69, O2 sats were 96% to 98% on room air at rest.   TIME SPENT:  40 minutes.    ____________________________ Theodoro Grist, MD rv:ea D: 05/15/2012 19:24:04 ET T: 05/16/2012 03:17:32 ET JOB#: [id number]  cc: Theodoro Grist, MD, <Dictator> Eduard Clos. Gilford Rile, MD    Theodoro Grist MD ELECTRONICALLY SIGNED 05/29/2012 7:12

## 2014-05-02 NOTE — Consult Note (Signed)
General Aspect [name] is a 81yo male with PMhx s/f CAD (s/p multiple PCIs- see below), PAF (on Pradaxa), HTN, HLD. h/o hyperthyroidism and h/o prostate CA who was admitted to [facility] yesterday with chest pain.   Past Cardiac History: stent-LAD 1993, stent-mid RCA 2003, ISR to LAD stent 2008 s/p repeat PCI-LAD & RCA. Stress test in 02/2007 and 01/2012 which showed no ischemia; EF 74%.  He was just seen in the office by Dr. Rockey Situ on 05/09/2012. He has had paroxysms of atrial fibrillation in the setting of self-discontinuing amiodarone, diltiazem and Pradaxa due to cost. Maintained on ASA/Plavix. He was restarted on low-dose amiodarone with recommendations for additional PRN dosing to be coupled with metoprolol for pharmacologic conversion back to NSR. Endorsed no chest pain at that time.   Present Illness He describes two different types of chest discomfort. He reports experiencing substernal dull chest discomfort produced reliably on walking a certain distance in his yard. This relieves quickly with rest, and has been stable for several months. No radiation or associated symptoms. He reports experiencing susternal/epigastric sharp chest discomfort occurring shortly after meals. He had three episodes yesterday which occurred after breakfast, lunch and a snack. He did note a change in quality with belching. He does note chronic fatigue. He denies trouble swallowing or water brash. Denies worsening SOB/DOE, LE edema, PND, orthopnea. He does endorse palpitations when he is in a-fib, but denies any episodes of this since follow-up. No lightheadedness or syncope. No unilateral leg pain, swelling or redness. Denies facial droop, extremity weakness, slurred speech or incoordination.   In the ED, EKG revealed NSR with 1 mm ST depressions inferolaterally (unchanged from recent office tracing). Initial trop-I WNL. Cr 1.37 (baseline 1.2-1.3). BMET, CBC otherwise unremarkable. CXR- no evidence of cardiopulmonary  disease. Bradycardia to 40s-50s on telemetry review.   PAST MEDICAL HISTORY:  1. Coronary artery disease.  2. Hypertension.  3. Prostate cancer, status post prostatectomy.  4. 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  5. Atrial fibrillation.   PAST SURGICAL HISTORY:  1. Appendectomy.  2. Prostatectomy.  3. Bilateral hernia repair.  4. History of stent placement.   SOCIAL HISTORY: The patient used to smoke cigars, quit in 1993. Drinks margarita occasionally. Drinks 1 small glass of wine at dinner. The patient lives at home with his wife.   FAMILY HISTORY: Significant for diabetes in his father and coronary artery disease in the family.   Physical Exam:  GEN thin   HEENT PERRL, hearing intact to voice, poor dentition   NECK supple  No masses  trachea midline   RESP normal resp effort  no use of accessory muscles  no wheezes, rales or rhonchi   CARD Regular rate and rhythm  No murmur   ABD denies tenderness  soft  hypoactive BS   EXTR negative cyanosis/clubbing, negative edema   SKIN normal to palpation   NEURO motor/sensory function intact, no facial droop, slurred speech   Review of Systems:  Subjective/Chief Complaint chest pain   General: Weight loss or gain  Fatigue   Cardiovascular: Chest pain or discomfort  Tightness   Gastrointestinal: Heartburn   Musculoskeletal: Muscle or joint pain  back pain   Neurologic: No Complaints   Review of Systems: All other systems were reviewed and found to be negative     Hyperthyroidism:    Cancer, Prostate:    Atrial Fibrillation:    CAD:    Cholecystectomy:  Appendectomy:    Prostatectomy:    Prostatectomy:    Stent - Cardiac:   Home Medications: Medication Instructions Status  amiodarone 200 mg oral tablet 1 tab orally once  Active  aspirin 81 mg oral tablet, chewable 1 tab(s) orally once a day Active  Plavix 75 mg oral tablet 1 tab(s) orally once a  day Active  polyethylene glycol 3350 oral powder for reconstitution 1 packet(s) orally , As Needed  Active  Vytorin 10 mg-20 mg oral tablet 0.5 tab(s) orally once a day (at bedtime) Active  AndroGel Apply topically to affected area once a day (at bedtime) Active  AndroGel 12.5 mg/1.25 g (1%) transdermal gel 2 pump(s) transdermal once a day (in the morning) Active   Lab Results:  Routine Chem:  06-May-14 03:46   Glucose, Serum  101  BUN  20  Creatinine (comp)  1.31  Sodium, Serum 141  Potassium, Serum 4.2  Chloride, Serum  109  CO2, Serum 28  Calcium (Total), Serum 8.9  Anion Gap  4  Osmolality (calc) 284  eGFR (African American) >60  eGFR (Non-African American)  52 (eGFR values <65m/min/1.73 m2 may be an indication of chronic kidney disease (CKD). Calculated eGFR is useful in patients with stable renal function. The eGFR calculation will not be reliable in acutely ill patients when serum creatinine is changing rapidly. It is not useful in  patients on dialysis. The eGFR calculation may not be applicable to patients at the low and high extremes of body sizes, pregnant women, and vegetarians.)  Cardiac:  06-May-14 03:46   Troponin I < 0.02 (0.00-0.05 0.05 ng/mL or less: NEGATIVE  Repeat testing in 3-6 hrs  if clinically indicated. >0.05 ng/mL: POTENTIAL  MYOCARDIAL INJURY. Repeat  testing in 3-6 hrs if  clinically indicated. NOTE: An increase or decrease  of 30% or more on serial  testing suggests a  clinically important change)  CK, Total 71  CPK-MB, Serum 0.9 (Result(s) reported on 15 May 2012 at 04:53AM.)  Routine Hem:  06-May-14 03:46   WBC (CBC) 7.0  RBC (CBC) 4.75  Hemoglobin (CBC) 13.4  Hematocrit (CBC) 40.1  Platelet Count (CBC) 183  MCV 84  MCH 28.2  MCHC 33.5  RDW 13.5  Neutrophil % 60.4  Lymphocyte % 25.6  Monocyte % 9.6  Eosinophil % 3.7  Basophil % 0.7  Neutrophil # 4.2  Lymphocyte # 1.8  Monocyte # 0.7  Eosinophil # 0.3  Basophil # 0.1  (Result(s) reported on 15 May 2012 at 04:47AM.)   EKG:  Interpretation Sinus bradycardia, 1 mm ST depression II, III, aVF, V5, V6; TW flattening I, aVL   Rate 52   EKG Comparision Not changed from  office tracing 05/09/12   Radiology Results: XRay:    05-May-14 19:13, Chest PA and Lateral  Chest PA and Lateral   REASON FOR EXAM:    Chest Pain  COMMENTS:       PROCEDURE: DXR - DXR CHEST PA (OR AP) AND LATERAL  - May 14 2012  7:13PM     RESULT: Comparison: 02/17/2012    Findings:  The heart and mediastinum are stable. No focal pulmonary opacities.    IMPRESSION:  No acute cardiopulmonary disease.    Dictation site: 2    Verified By: RGregor Hams M.D., MD    Zetia: Headaches  Crestor: Other  Ambien: Other  Toprol XL: Other  Lipitor: Other  Vital Signs/Nurse's Notes: **Vital Signs.:   06-May-14 07:58  Vital Signs Type  Routine  Temperature Temperature (F) 98.2  Celsius 36.7  Temperature Source oral  Pulse Pulse 49  Respirations Respirations 16  Systolic BP Systolic BP 615  Diastolic BP (mmHg) Diastolic BP (mmHg) 80  Mean BP 103  Pulse Ox % Pulse Ox % 96  Pulse Ox Activity Level  At rest  Oxygen Delivery Room Air/ 21 %  *Intake and Output.:   Shift 06-May-14 15:00  Grand Totals Intake:   Output:  500    Net:  -500 24 Hr.:  -500  Urine ml     Out:  500  Length of Stay Totals Intake:  401 Output:  500    Net:  -99    Impression [name] is a 81yo male with PMhx s/f CAD (s/p multiple PCIs- see below), PAF (on Pradaxa), HTN, HLD. h/o hyperthyroidism and h/o prostate CA who was admitted to [facility] yesterday with chest pain.  1. Precordial pain Reviewing the patient's subjective and objective information regarding chest pain, suspect the more acute episode of substernal discomfort after meals with changes with belching and is relieved by NTG may represent GERD/esophageal etiologies. He is not on an acid reducer. His chronic substernal chest discomfort occurring  after ambulating in the yard, and relieving with rest has been present for months, and has not changed in frequency or severity, likely represents stable angina. EKG in the ED and troponins indicate no evidence of ischemia. Furthermore, a nuclear stress test in 01/2012 showed no evidence of ischemia, preserved EF. He has been bradycardic this admission. This may attribute to his overall fatigue, and could correspond to reduced coronary perfusion.  -- Would treat more acute discomfort with PPI, follow-up with PCP/GI -- Add Imdur 31m daily, follow-up with [place] 1[place]2 weeks -- Hold metoprolol with bradycardia  2. CAD/stable angina As above, suspect chest pain multifactorial- GERD/esophageal symptoms on a background of chronic stable angina -- Continue ASA/Plavix/Vytorin/Imdur/NTG SL PRN (needs an updated Rx for this) -- Hold off on BB with bradycardia -- If exertional chest pain becomes more frequent or severe despite conservative medical management, and GERD treatment, will revisit repeat diagnostic cardiac catheterization   Plan 3. Fatigue Reports generalized fatigue and weight loss. Ddx includes thyroid abnormalities, sinus bradycardia or malnutrition (poor dentition affecting ability to eat) -- Follow-up PCP/check TSH -- Consider nutritionist and outpatient/home health PT   4. PAF Maintaining NSR while inpatient. Taking low-dose amiodarone. Self-discontinued diltiazem and Pradaxa in the past. The latter was due to cost.  -- Hold off on BB with bradycardia -- Continue ASA/Plavix for some thromboembolic protection -- Continue low-dose amiodarone -- Follow-up in the office in 1-2 weeks to revisit management of a-fib and consider Coumadin for anticoagulation (CHADSVASc at least 3), this may be a more afforable, albeit less convenient option  Attending note:  I agree with above findings.  he has 2 kinds of CP .  He has ruled out for Mi and has had a normal stress myoview in January, 2014.  I have  recommended that he start a regular walking program.  he has been instructed to call [place] he has worsening CP with exertion or is unable to advance in his exercise regimine.  OK to go home - Imdur added. would decrease amiodarone to 100 mg a day ( home dose)      5. Hyperlipidemia LDL 68 -- Continue antihyperlipidemic  6. Hypertension BP mildly elevated this AM -- Adding Imdur which will have some effect on BP -- Consider adding ACEi  if remains elevated on follow-up   Electronic Signatures: Meriel Pica (PA-C)  (Signed 06-May-14 10:08)  Authored: General Aspect/Present Illness, History and Physical Exam, Review of System, Past Medical History, Home Medications, Labs, EKG , Radiology, Allergies, Vital Signs/Nurse's Notes, Impression/Plan Nahser, Dreama Saa (MD)  (Signed 06-May-14 10:17)  Authored: Impression/Plan  Co-Signer: General Aspect/Present Illness, Past Medical History, Home Medications, Labs, EKG , Radiology, Allergies, Vital Signs/Nurse's Notes, Impression/Plan   Last Updated: 06-May-14 10:17 by Acie Fredrickson Dreama Saa (MD)

## 2014-05-05 LAB — SURGICAL PATHOLOGY

## 2014-05-11 NOTE — Consult Note (Signed)
Brief Consult Note: Diagnosis: displaced right subcapital femoral neck fracture with osteoarthritis.   Patient was seen by consultant.   Recommend to proceed with surgery or procedure.   Orders entered.   Comments: needs femoral head replacement.  With his activity level and hip OA, recommend THA, anterior approach.  Electronic Signatures: Laurene Footman (MD)  (Signed 26-Jan-16 10:10)  Authored: Brief Consult Note   Last Updated: 26-Jan-16 10:10 by Laurene Footman (MD)

## 2014-05-11 NOTE — Consult Note (Signed)
PATIENT NAME:  [name], [name] MR#:  [id number] DATE OF BIRTH:  [birth year]  DATE OF CONSULTATION:  02/04/2014  CONSULTING PHYSICIAN:  Laurene Footman, MD  REASON FOR CONSULTATION:  Displaced femoral neck fracture.   HISTORY OF PRESENT ILLNESS:  The patient is a very active 81 year old. He suffered a fall, slipping today on some black ice, fell down. He was brought to the Emergency Room, where he was found to have a femoral neck fracture. He is admitted for treatment of this.   PAST MEDICAL HISTORY:  Remarkable for cardiac disease, hypertension, and prior bypass surgery about a year and a half ago, from which he reports he has done very well. He also has a history of prostate cancer with prostatectomy.   REVIEW OF SYSTEMS:  Negative for loss of consciousness or any other significant problem currently.   MEDICATIONS:  He currently is just on testosterone shot every other week, simvastatin 20 mg at night, and aspirin daily.   FAMILY HISTORY:  Positive for coronary artery disease and diabetes.   PHYSICAL EXAMINATION: GENERAL:  He is a slender white male who appears younger than his stated age, in mild distress secondary to hip pain.  HEENT:  Normocephalic, atraumatic.  CHEST:  Nontender.  EXTREMITIES:  Upper extremities appear normal. His right lower extremity is shortened and externally rotated with a palpable dorsalis pedis and posterior tibialis pulse. He is able to flex and extend the toes and has intact sensation.   IMAGING DATA:  X-rays reveal a completely displaced femoral neck fracture with posterior angulation and posterior displacement of the head with some underlying osteoarthritis present.   IMPRESSION:  Femoral neck fracture with underlying arthritis in a very active 81 year old.   RECOMMENDATION:  Direct anterior total hip. The risks, benefits, and possible complications were discussed. He will probably require a postoperative short-term rehabilitation stay.     ____________________________ Laurene Footman, MD mjm:nb D: 02/04/2014 22:14:11 ET T: 02/04/2014 22:58:19 ET JOB#: [id number]  cc: Laurene Footman, MD, <Dictator> Laurene Footman MD ELECTRONICALLY SIGNED 02/05/2014 0:18

## 2014-05-11 NOTE — H&P (Signed)
PATIENT NAME:  [name], [name] MR#:  [id number] DATE OF BIRTH:  [birth year]  DATE OF ADMISSION:  02/04/2014  PRIMARY CARE PHYSICIAN:  Jackolyn Confer, MD   PRIMARY CARDIOLOGIST:  Minna Merritts, MD   REFERRING EMERGENCY ROOM PHYSICIAN:   Owens Shark, MD  CHIEF COMPLAINT: Fall.   HISTORY OF PRESENT ILLNESS: This is a 81 year old male who has history of coronary artery disease, hypertension, bypass surgery, prostate cancer and prostatectomy who is very active according to his life; he was trying to go to his car today on the street, he could not see black ice and slipped on that and fell down and could not get up; had severe pain on the right hip, called EMS, brought to ER, and found having nondisplaced fracture of right femoral neck and so requested to admit to medical team because of cardiac history and orthopedic consult was called in.   REVIEW OF SYSTEMS:  CONSTITUTIONAL: Negative for fever, fatigue, weakness, pain, or weight loss.  EYES: No blurring, double vision, discharge, or redness.  EARS AND NOSE AND THROAT: No tinnitus, ear pain or hearing loss.  RESPIRATORY: No cough, wheezing, hemoptysis, or shortness of breath.  CARDIOVASCULAR: No chest pain, orthopnea, edema, arrhythmia, palpitations.  GASTROINTESTINAL: No nausea, vomiting, diarrhea, abdominal pain.  GENITOURINARY: No dysuria, hematuria, or increased frequency.  ENDOCRINE: No heat or cold intolerance. No excessive sweating.  SKIN: No acne, rashes, or lesions.  MUSCULOSKELETAL: No pain or swelling in the joints.  NEUROLOGICAL: No numbness, weakness, tremor or vertigo.  PSYCHIATRIC: No anxiety, insomnia, bipolar disorder.   PAST MEDICAL HISTORY: 1.  Coronary artery disease and bypass graft surgery 1-1/2 years ago.  2.  Hypertension.  3.  Prostate cancer status post prostatectomy and on testosterone replacement.  4.  Hypothyroidism was in the past because of amiodarone for his atrial fibrillation a few years ago, but it is gone  now.  5.  Atrial fibrillation. He had history of that but at the time of CABG evaluation was done and so he does not have atrial fibrillation anymore.   PAST SURGICAL HISTORY:  1.  CABG 1-1/2 years ago.  2.  Appendectomy.  3.  Prostatectomy.  4.  Bilateral hernia repair surgery.  5.  History of stent placement.   SOCIAL HISTORY: He used to smoke but quit cigar in 1993, he drinks Margerita occasionally, lives at home with his wife.   FAMILY HISTORY: Significant for diabetes in his father and coronary artery disease in family.   HOME MEDICATIONS: 1.  Testosterone intramuscular injections every other week.  2.  Simvastatin 20 mg oral once a day at bedtime.  3.  Aspirin 81 mg once a day.   PHYSICAL EXAMINATION: VITAL SIGNS: Temperature 97.7, pulse is 80, respirations 18, blood pressure 163/86, and pulse oximetry is 93.  GENERAL: The patient is fully alert and oriented to time, place, and person and does not appear in any acute distress. Has slight pain because of his fracture, but otherwise in very good mood.  HEENT: Head and neck atraumatic. Conjunctivae pink. Oral mucosa moist.  NECK: Supple. No JVD. Thyroid nontender.  RESPIRATORY: Bilateral equal and clear air entry.  CARDIOVASCULAR: S1, S2 present, regular. No murmur. No wheezing or crepitation.  ABDOMEN: Soft, nontender. Bowel sounds present. No organomegaly.  SKIN: No acne, rashes, or lesions.  MUSCULOSKELETAL: No tenderness or swelling in the joints except right hip.  LEGS: No edema.  NEUROLOGICAL: Power 5/5 in all 3 limbs except right lower limb, which is  not able to move because of fracture and pain. No tremor or rigidity. Follows commands. Sensation is intact. Distal pulses intact on right lower extremity.  PSYCHIATRIC: Does not appear in acute psychiatric illness.   IMPORTANT LABORATORY RESULTS:  1.  Glucose 139, BUN 19, creatinine 1.40, sodium 138, potassium 4.1, chloride 105, CO2 of 30; calcium is 9.  2.  WBC count 7,  hemoglobin 16.1, platelet count 169,000, MCV is 84.  3.  X-ray of hip on the right side impacted, minimally displaced right femoral neck fracture without dislocation.  4.  Chest x-ray, portable, showed no pleural effusion or pneumothorax, no acute abnormality.   ASSESSMENT AND PLAN: A 81 year old male who has past history of coronary artery disease and hypertension and coronary artery bypass graft; he slipped on the ice today and broke his right femur.  1.  Right humeral neck fracture. Admit to medicine, consult orthopedic for further management; meanwhile pain management and deep vein thrombosis prophylaxis as per orthopedic team. We will keep him n.p.o. currently, he planning to do surgery today.  2.  Coronary artery disease. The patient has history of coronary artery bypass graft; he was taking only aspirin and statin for that, not on any beta blockers. If his blood pressure stays high we might have to start that. Currently, he does not have any chest pain and he never had chest pain after his coronary artery bypass graft his routine work-up. Follows regularly with Dr. Rockey Situ in the office, so does not need any additional work-up before he goes for surgery, can proceed with moderate risk as he has coronary artery disease.  3.  Hypertension. Blood pressure is stable currently. He is going for surgery. If pressure remains high, we might need to start him on beta blocker because of coronary artery disease. He is not taking anything at home.  4.  History of prostate cancer and status post prostatectomy, was taking testosterone at home. We will advise to continue that after discharge. The last injection he took was yesterday.   CODE STATUS: Full code.   TOTAL TIME SPENT ON THIS ADMISSION: Was 50 minutes.    ____________________________ Ceasar Lund Anselm Jungling, MD vgv:nt D: 02/04/2014 19:23:47 ET T: 02/04/2014 20:48:56 ET JOB#: [id number]  cc: Ceasar Lund. Anselm Jungling, MD, <Dictator> Minna Merritts, MD Eduard Clos Gilford Rile, MD  Vaughan Basta MD ELECTRONICALLY SIGNED 02/24/2014 9:57

## 2014-05-11 NOTE — Discharge Summary (Signed)
PATIENT NAME:  [name], DAYHOFF MR#:  [id number] DATE OF BIRTH:  31-Oct-1932  DATE OF ADMISSION:  02/04/2014 DATE OF DISCHARGE:  02/07/2014  PRESENTING COMPLAINT: Fall.  DISCHARGE DIAGNOSIS: Right femoral neck fracture, status post right total hip replacement.  PROCEDURES: Right total hip replacement by Dr. [name].  CODE STATUS: Full code.  MEDICATIONS: 1.  Aspirin 81 mg p.o. daily. 2.  Simvastatin 20 mg p.o. at bedtime. 3.  Testosterone 100 mg intramuscularly every other week. 4.  Oxycodone 5 mg 1 tablet every 4 hours as needed. 5.  Tramadol 50 mg 1 tablet every 4 hours. 6.  Enoxaparin 40 mg subcutaneous daily for 10 more days and then begin aspirin 81 mg thereafter. 7.  Acetaminophen 500 mg 1 tablet every 4 hours as needed.  FOLLOWUP:  1.  With Laurene Footman, MD as instructed. 2.  With Anderson Malta A. Gilford Rile, MD, primary care physician, in 2-3 weeks.  ORTHOPEDIC CONSULTATION: Laurene Footman, MD  DISCHARGE LABORATORIES: Creatinine at discharge 1.15. Sodium is 139, potassium is 4.3, H and H is 12.4 and 38.6.  BRIEF SUMMARY OF HOSPITAL COURSE: [name] is a 81 year old, very pleasant, Caucasian gentleman who presented to the Emergency Room after he had slipped on the ice and presented with: 1.  Right femoral neck fracture. The patient is postoperative day 3. The patient underwent right total hip replacement by Dr. [name]. On postoperative day 3, he was doing well, ambulating by himself using a walker. Physical therapy recommended home health PT which has been arranged. DVT prophylaxis was prescribed by Dr. [name]. 2.  Hypertension. Blood pressure was stable. Home medications were continued. 3.  Coronary artery disease, status post CABG. Started back on aspirin and statin, doing well. 4.  History of prostate cancer, status post prostatectomy. Taking testosterone at home which was resumed at discharge.  Hospital stay otherwise remained stable. The patient remained a full code.  TIME SPENT:  Forty minutes.   ____________________________ Hart Rochester Posey Pronto, MD sap:TT D: 02/11/2014 17:29:01 ET T: 02/11/2014 22:02:58 ET JOB#: [id number]  cc: Keaden Gunnoe A. Posey Pronto, MD, <Dictator> Laurene Footman, MD Eduard Clos Gilford Rile, MD Ilda Basset MD ELECTRONICALLY SIGNED 02/27/2014 23:17

## 2014-05-11 NOTE — Op Note (Signed)
PATIENT NAME:  [name], [name] MR#:  [id number] DATE OF BIRTH:  Nov 18, 1932  DATE OF PROCEDURE:  02/04/2014  PREOPERATIVE DIAGNOSES: Right femoral neck fracture, right hip osteoarthritis.   POSTOPERATIVE DIAGNOSES: Right femoral neck fracture, right hip osteoarthritis.  PROCEDURE: Right total hip replacement, anterior approach.   ANESTHESIA: Spinal.   SURGEON: Hessie Knows, MD   DESCRIPTION OF PROCEDURE: The patient was brought to the operating room and after adequate anesthesia was obtained, the right leg was draped in the Medacta attachment, left leg on a well-padded table. C-arm was brought in and good visualization of the hip was obtained with restoration of length based on the traction view. The hip was prepped and draped in the usual sterile fashion. After patient identification and timeout procedures were completed, anterior approach was made with an oblique incision centered over the tensor fascia and greater trochanter. The tensor fascia was incised and the muscle retracted laterally. The deep fascia exposed and the lateral femoral circumflex vessels ligated. The anterior capsule was then incised and fracture hematoma removed. A femoral neck cut was made distal to the subcapital hip fracture. The neck and head were removed with moderate degenerative changes present to the femoral head. Reaming was carried out to the femoral head to 58 mm, at which point there was good bleeding bone. A 58 mm trial fit well and was impacted into place. With external rotation, the pubofemoral and ischiofemoral releases were carried out and the leg dropped into extension. Sequential broaching was carried out to a size 7 and the lateralized stem gave a better restoration of preoperative anatomy. The size 7 Amis lateralized hip stem was then impacted down the canal. Based on trials, an L-28 head with the DM liner for the 58 mm cup were assembled and these were impacted onto the stem. The hip was reduced and was stable  through range of motion. It appeared restoration of anatomic length. The wound was thoroughly irrigated and then closed with a heavy quill for the deep fascia after infiltration of 30 mL of 0.25% Sensorcaine with epinephrine in the periarticular tissue. A subcutaneous drain was placed followed by 2-0 quill subcuticularly and skin staples. Xeroform, 4 x 4's, ABD, and tape were applied. The patient was taken to the recovery room in stable condition.   ESTIMATED BLOOD LOSS: 600.   COMPLICATIONS: None.   SPECIMEN: Removed femoral head.   IMPLANTS: Amis stem size 7 lateralized, Versafitcup 58 with appropriate liner, and an L-28 mm head.    ____________________________ Laurene Footman, MD mjm:bm D: 02/04/2014 21:27:31 ET T: 02/05/2014 00:09:50 ET JOB#: [id number]  cc: Laurene Footman, MD, <Dictator> Laurene Footman MD ELECTRONICALLY SIGNED 02/05/2014 7:16

## 2014-07-21 ENCOUNTER — Telehealth: Payer: Self-pay | Admitting: *Deleted

## 2014-07-21 NOTE — Telephone Encounter (Signed)
Pt came in to clinic requesting instruction on how to properly fill syringe for Testosterone inject.  Pt states he was not sure he was getting the correct dosage.  Demonstrated for pt how to properly fill syringe and to change the needle for injection.  Pt demonstrated process back to me.  Pt states he has been prescribed Testosterone injections by Dr Bernardo Heater in Urology.

## 2014-07-23 IMAGING — CR DG CHEST 1V PORT
1 series · 1 of 1 positions shown · non-contrast
Comparison: none

REASON FOR EXAM: discomfort with hx of afib
COMMENTS:

[ap]
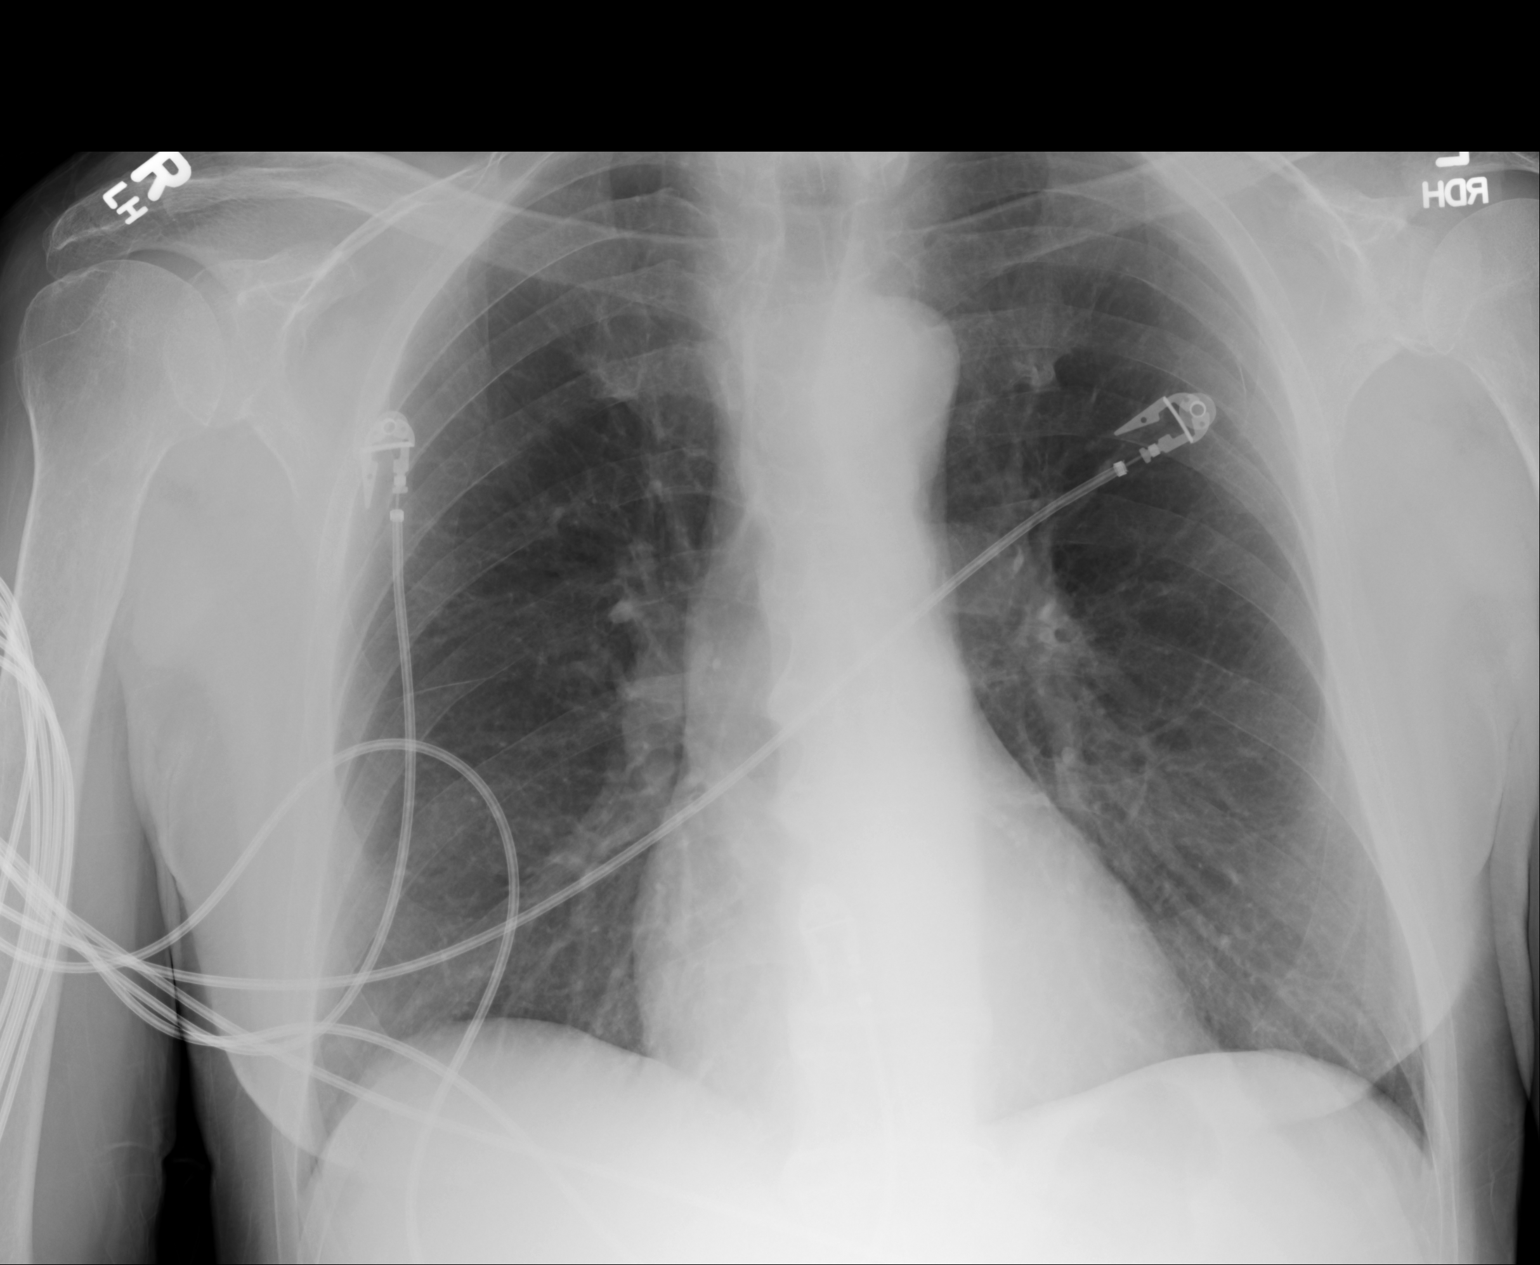

[1 of 1 positions shown; findings below may reference images not displayed]

PROCEDURE:     DXR - DXR PORTABLE CHEST SINGLE VIEW  - January 22, 2012 [DATE]

RESULT:     Comparison is made to the study July 13, 2010.

The lungs are well-expanded and clear. The cardiac silhouette is normal in
size. The mediastinum is normal in width. There is no pulmonary vascular
congestion. There is no pneumothorax or pleural effusion. The bony thorax
exhibits no acute abnormality.
IMPRESSION: There is no evidence of CHF nor pneumonia nor other acute
cardiopulmonary abnormality.

[REDACTED]

## 2014-07-30 ENCOUNTER — Other Ambulatory Visit: Payer: Self-pay | Admitting: Internal Medicine

## 2014-09-23 ENCOUNTER — Encounter: Payer: Self-pay | Admitting: Family Medicine

## 2014-09-23 ENCOUNTER — Ambulatory Visit (INDEPENDENT_AMBULATORY_CARE_PROVIDER_SITE_OTHER)
Admission: RE | Admit: 2014-09-23 | Discharge: 2014-09-23 | Disposition: A | Discharge status: Home / Self Care | Payer: PPO | Source: Ambulatory Visit | Attending: Family Medicine | Admitting: Family Medicine

## 2014-09-23 ENCOUNTER — Telehealth: Payer: Self-pay | Admitting: Surgical

## 2014-09-23 ENCOUNTER — Ambulatory Visit (INDEPENDENT_AMBULATORY_CARE_PROVIDER_SITE_OTHER): Payer: PPO | Admitting: Family Medicine

## 2014-09-23 ENCOUNTER — Other Ambulatory Visit: Payer: Self-pay | Admitting: Internal Medicine

## 2014-09-23 VITALS — BP 138/82 | HR 78 | Temp 98.1°F | Ht 73.0 in | Wt 175.1 lb

## 2014-09-23 DIAGNOSIS — R221 Localized swelling, mass and lump, neck: Secondary | ICD-10-CM | POA: Insufficient documentation

## 2014-09-23 DIAGNOSIS — M546 Pain in thoracic spine: Secondary | ICD-10-CM | POA: Diagnosis not present

## 2014-09-23 DIAGNOSIS — G8929 Other chronic pain: Secondary | ICD-10-CM

## 2014-09-23 DIAGNOSIS — E041 Nontoxic single thyroid nodule: Secondary | ICD-10-CM

## 2014-09-23 DIAGNOSIS — R748 Abnormal levels of other serum enzymes: Secondary | ICD-10-CM | POA: Diagnosis not present

## 2014-09-23 DIAGNOSIS — M549 Dorsalgia, unspecified: Secondary | ICD-10-CM

## 2014-09-23 DIAGNOSIS — K5900 Constipation, unspecified: Secondary | ICD-10-CM | POA: Insufficient documentation

## 2014-09-23 LAB — COMPREHENSIVE METABOLIC PANEL
ALK PHOS: 86 U/L (ref 39–117)
ALT: 14 U/L (ref 0–53)
AST: 18 U/L (ref 0–37)
Albumin: 4.4 g/dL (ref 3.5–5.2)
BILIRUBIN TOTAL: 0.8 mg/dL (ref 0.2–1.2)
BUN: 17 mg/dL (ref 6–23)
CO2: 27 meq/L (ref 19–32)
CREATININE: 1.14 mg/dL (ref 0.40–1.50)
Calcium: 10 mg/dL (ref 8.4–10.5)
Chloride: 103 mEq/L (ref 96–112)
GFR: 65.72 mL/min (ref >60.00)
Glucose, Bld: 123 mg/dL — ABNORMAL HIGH (ref 70–99)
Potassium: 4.1 mEq/L (ref 3.5–5.1)
SODIUM: 139 meq/L (ref 135–145)
TOTAL PROTEIN: 7.5 g/dL (ref 6.0–8.3)

## 2014-09-23 LAB — T4, FREE: Free T4: 0.84 ng/dL (ref 0.60–1.60)

## 2014-09-23 LAB — TSH: TSH: 1.97 u[IU]/mL (ref 0.35–4.50)

## 2014-09-23 NOTE — Assessment & Plan Note (Signed)
Reports long history of constipation that responds to miralax. Normal abdominal exam today. Notes this is improving. Does note thin strips of stool that could be the result of his constipation, though could be due to other colon lesion. Had normal colonoscopy 3 years ago per review of chart. Discussed possible causes. Advised to continue miralax. Will check stool cards for blood. If these are positive or if the thin stool persists over the next few days will need to refer to GI. Given return precautions.

## 2014-09-23 NOTE — Assessment & Plan Note (Signed)
Notes chronic back pain that bothers intermittently. Neurologically intact. Has history of prostate cancer. Noted that alk phos was elevated on last check. Has had PSA <0.10 at last check. Concern would be that this could represent a prostate lesion given elevated alk phos. Will check XR thoracic spine. Will recheck alk phos. If alk phos is elevated will need to repeat PSA. Advised to avoid NSAIDs. Tylenol for discomfort. Given return precautions.

## 2014-09-23 NOTE — Telephone Encounter (Signed)
Patient was seen this morning by Dr. Caryl Bis for Lump in the Lt Throat/Neck area. Patient and his wife walked back in about 12 PM wanting a referral to an Endocrinologist because patients wife stated that about 10 years ago he was diagnosed with Hyperthyroidism and had a nodule on the thyroid. Spoke with Dr. Caryl Bis about this and he spoke with Dr. Gilford Rile who put in lab work for patient and put in referral for Endocrinologist. Dr. Caryl Bis also put in an order for a CT scan. Patient will call back if they need anything else.

## 2014-09-23 NOTE — Assessment & Plan Note (Addendum)
Patient with acute onset of left neck mass. Has improved at this time. Given sudden onset and quick improvement this likely represents a sialadenitis of the sub mandibular gland, though could be an enlarged lymph node. No obvious abnormalities in the patients mouth. Discussed CT scan of neck with patient to evaluate this area further, though patient declined this opting to monitor this for improvement since it has improved since this morning. Advised to go to the ed if he has issues swallowing or with breathing. Given return precautions.   Addendum: patient returned to the office with his wife and spoke with [place]. Please see her note for documentation. They have changed their minds regarding the CT scan and would like for this to be ordered at this time. Will place order for CT scan soft tissue neck with contrast. Patient with most recent GFR >60.

## 2014-09-23 NOTE — Progress Notes (Signed)
Pre visit review using our clinic review tool, if applicable. No additional management support is needed unless otherwise documented below in the visit note. 

## 2014-09-23 NOTE — Patient Instructions (Signed)
Nice to meet you. Please continue to monitor the area in your neck. If it enlarges again or your have trouble swallowing or breathing please seek medical attention right away. This is likely related to a viral illness. Please go get the x-ray of your back to evaluate your back pain.  Please complete the stool cards and bring them back to the office.  Please seek medical attention if you develop issues breathing, swallowing, abdominal pain, nausea, vomiting, diarrhea, blood in stool, loss of bowel or bladder function, numbness, or weakness.

## 2014-09-23 NOTE — Addendum Note (Signed)
Addended by: Johnsie Cancel on: 09/23/2014 12:26 PM   Modules accepted: Orders

## 2014-09-23 NOTE — Progress Notes (Signed)
Patient ID: [name], male   DOB: 1932/03/10, 81 y.o.   MRN: [id number]  Tommi Rumps, MD Phone: [phone]  [name] is a 81 y.o. male who presents today for same day appointment.  Left neck swelling: patient presents with onset of mass in left neck this morning. Notes it was mildly painful earlier and prevented him from swallowing earlier, though this has improved and he is able to swallow now. He notes some long term rhinorrhea and recent mild sinus congestion. No sore throat, post nasal drip, or fever. No issues breathing. Notes the size has decreased at this time. No history of this in the past.   Constipation: patient notes long history of intermittent constipation for which he takes miralax. He notes he does not like to take miralax as this causes him to go to the bathroom frequently. He notes he started taking this in the past few days and has started to have BMs that are long thin strips. No blood in stool. No diarrhea. No abdominal pain. Notes he occasionally takes MOM. Notes this is improving at this time. Had normal colonoscopy 3 years ago.   Thoracic back pain: is in mid right back. Is chronic for many years. Is an aching pain. No radiation. No weakness or numbness. No loss of bowel or bladder function, No saddle anesthesia. No fevers. Has history of prostate cancer. Last PSA <0.10.  PMH: nonsmoker.   ROS see HPI  Objective  Physical Exam Filed Vitals:   09/23/14 0926  BP: 138/82  Pulse: 78  Temp: 98.1 F (36.7 C)    Physical Exam  Constitutional: He is well-developed, well-nourished, and in no distress.  HENT:  Head: Normocephalic and atraumatic.  Right Ear: External ear normal.  Left Ear: External ear normal.  Posterior OP with mild erythema, no exudates, no tonisllar swelling or shift of uvula No swelling noted in mouth, no enlargement of stenson or wharton duct  Eyes: Conjunctivae are normal. Pupils are equal, round, and reactive to light.  Neck:  Neck supple.  Left neck just inferior to mandible, lesion is firm and freely mobile, there is no overlying erythema or fluctuance, there is no skin induration No supraclavicular LAD  Cardiovascular: Normal rate, regular rhythm and normal heart sounds.  Exam reveals no gallop and no friction rub.   No murmur heard. Pulmonary/Chest: Effort normal and breath sounds normal. No respiratory distress. He has no wheezes. He has no rales.  Abdominal: Soft. Bowel sounds are normal. He exhibits no distension. There is no tenderness. There is no rebound and no guarding.  Musculoskeletal:  No midline spine tenderness, there is mild tenderness in the right mid thoracic paraspinous muscles  Neurological: He is alert.  5/5 strength in bilateral biceps, triceps, grip, quads, hamstrings, plantar and dorsiflexion, sensation to light touch intact in bilateral UE and LE, normal gait, 2+ patellar reflexes  Skin: Skin is warm and dry. He is not diaphoretic.     Assessment/Plan: Please see individual problem list.  Neck mass Patient with acute onset of left neck mass. Has improved at this time. Given sudden onset and quick improvement this likely represents a sialadenitis of the sub mandibular gland, though could be an enlarged lymph node. No obvious abnormalities in the patients mouth. Discussed CT scan of neck with patient to evaluate this area further, though patient declined this opting to monitor this for improvement since it has improved since this morning. Advised to go to the ed if he has issues  swallowing or with breathing. Given return precautions.   Chronic back pain Notes chronic back pain that bothers intermittently. Neurologically intact. Has history of prostate cancer. Noted that alk phos was elevated on last check. Has had PSA <0.10 at last check. Concern would be that this could represent a prostate lesion given elevated alk phos. Will check XR thoracic spine. Will recheck alk phos. If alk phos is  elevated will need to repeat PSA. Advised to avoid NSAIDs. Tylenol for discomfort. Given return precautions.   Constipation Reports long history of constipation that responds to miralax. Normal abdominal exam today. Notes this is improving. Does note thin strips of stool that could be the result of his constipation, though could be due to other colon lesion. Had normal colonoscopy 3 years ago per review of chart. Discussed possible causes. Advised to continue miralax. Will check stool cards for blood. If these are positive or if the thin stool persists over the next few days will need to refer to GI. Given return precautions.     Orders Placed This Encounter  Procedures  . DG Thoracic Spine 2 View    Standing Status: Future     Number of Occurrences:      Standing Expiration Date: 11/23/2015    Order Specific Question:  Reason for Exam (SYMPTOM  OR DIAGNOSIS REQUIRED)    Answer:  thoracic back pain    Order Specific Question:  Preferred imaging location?    Answer:  [facility]  . Comp Met (CMET)    Tommi Rumps

## 2014-09-23 NOTE — Addendum Note (Signed)
Addended by: Caryl Bis Aubrie Lucien G on: 09/23/2014 12:28 PM   Modules accepted: Orders

## 2014-09-25 ENCOUNTER — Telehealth: Payer: Self-pay | Admitting: Family Medicine

## 2014-09-25 NOTE — Telephone Encounter (Signed)
Called patient to discuss results. Noted that his blood sugar was mildly elevated and has been mildly elevated in the past. Would benefit from A1c testing at his next office visit. Normal alk phos. Advised of arthritic changes on thoracic spine films. No fracture. Note he does have tortuous aorta and atherosclerosis in his aorta on the x-ray. Has previously had abdominal US for this and noted to have atherosclerosis of the aorta with maximal diameter of 2.4 cm. Patient asked that I send the results of the x-ray to Dr Rockey Situ whom the patient will see next month. Advised that if he were to develop abdominal pain he should seek medical attention.

## 2014-09-29 ENCOUNTER — Ambulatory Visit
Admission: RE | Admit: 2014-09-29 | Discharge: 2014-09-29 | Disposition: A | Discharge status: Home / Self Care | Payer: PPO | Source: Ambulatory Visit | Attending: Family Medicine | Admitting: Family Medicine

## 2014-09-29 ENCOUNTER — Telehealth: Payer: Self-pay | Admitting: Family Medicine

## 2014-09-29 DIAGNOSIS — R221 Localized swelling, mass and lump, neck: Secondary | ICD-10-CM | POA: Insufficient documentation

## 2014-09-29 HISTORY — DX: Malignant neoplasm of prostate: C61

## 2014-09-29 HISTORY — DX: Presence of aortocoronary bypass graft: Z95.1

## 2014-09-29 MED ORDER — IOHEXOL 350 MG/ML SOLN
75.0000 mL | Freq: Once | INTRAVENOUS | Status: AC | PRN
  Administered 2014-09-29: 75 mL via INTRAVENOUS

## 2014-09-29 NOTE — Telephone Encounter (Signed)
Patient returned physician's call.

## 2014-09-29 NOTE — Telephone Encounter (Signed)
Attempted to call patient to discuss the results of his CT scan. There was no answer. Left a VM asking them to call back to the office. Will await his call.

## 2014-09-30 NOTE — Telephone Encounter (Signed)
Called and spoke to patient regarding results. Informed that there was no mass seen. He does have a small stone in a salivary gland that could be the likely cause of his recent swelling. Advised to monitor for further swelling and if this occurs he would need to see ENT. Also discussed the thyroid nodule seen on CT. Advised that he should keep his follow-up with endocrinology for this.

## 2014-10-02 ENCOUNTER — Encounter: Payer: Self-pay | Admitting: Endocrinology

## 2014-10-02 ENCOUNTER — Ambulatory Visit (INDEPENDENT_AMBULATORY_CARE_PROVIDER_SITE_OTHER): Payer: PPO | Admitting: Endocrinology

## 2014-10-02 VITALS — BP 128/84 | HR 71 | Temp 98.5°F | Ht 73.0 in | Wt 172.0 lb

## 2014-10-02 DIAGNOSIS — E041 Nontoxic single thyroid nodule: Secondary | ICD-10-CM | POA: Diagnosis not present

## 2014-10-02 NOTE — Progress Notes (Signed)
Subjective:    Patient ID: [name], male    DOB: 1932-02-25, 81 y.o.   MRN: [id number]  HPI Pt says he was rx'ed for hyperthyroidism in approx 2008.  He says this resolved with prednisone.  He says he had an [place] by Dr Bary Leriche some years ago, but never had a bx.  he was then incidentally noted to have a nodule at the thyroid in 2011, on a chest CT.  He has no h/o XRT or surgery to the neck.  He has 1 week of moderate swelling at the left anterior neck, and assoc pain.   Past Medical History  Diagnosis Date  . Coronary artery disease     a. 1993 s/p PCI/BMS LAD;  b. 2003 PCI RCA;  c. 09/2006 ISR LAD->PCI, PCI RCA;  d. 02/2007 MV: no ischemia;  d. 01/2012 MV: EF 74%, no ischemia.  . Hyperlipidemia   . Hypertension   . Hyperthyroidism   . PAF (paroxysmal atrial fibrillation)     a. previously on amio ->stopped in 2013;  b. recurrent PAF 01/2012->Pradaxa and amio initiated, subsequently came off of pradaxa 2/2 cost;  c. 02/2012 Echo: EF 55-60%, Gr 1 DD.  [name] Low testosterone   . History of prostate cancer   . Prostate cancer   . S/P CABG x 4     Past Surgical History  Procedure Laterality Date  . Cardiac catheterization  2008  . Prostatectomy    . Hernia repair      s/p mesh bilaterally, Dr. Bary Castilla  . Appendectomy      Dr. Bary Castilla  . Colonoscopy  2013  . Intraoperative transesophageal echocardiogram N/A 05/18/2012    Procedure: INTRAOPERATIVE TRANSESOPHAGEAL ECHOCARDIOGRAM;  Surgeon: [name], MD;  Location: [place];  Service: Open Heart Surgery;  Laterality: N/A;  . Maze N/A 05/18/2012    Procedure: MAZE;  Surgeon: [name], MD;  Location: [place];  Service: Open Heart Surgery;  Laterality: N/A;  . Clipping of atrial appendage Left 05/18/2012    Procedure: CLIPPING OF ATRIAL APPENDAGE;  Surgeon: [name], MD;  Location: [place];  Service: Open Heart Surgery;  Laterality: Left;  . Coronary artery bypass graft N/A 05/18/2012    Procedure: CORONARY ARTERY BYPASS GRAFTING  (CABG);  Surgeon: [name], MD;  Location: [place];  Service: Open Heart Surgery;  Laterality: N/A;  Times 4 using left internal mammary artery and endoscopically harvested right saphenous vein  . Left heart catheterization with coronary angiogram Bilateral 05/16/2012    Procedure: LEFT HEART CATHETERIZATION WITH CORONARY ANGIOGRAM;  Surgeon: Burnell Blanks, MD;  Location: [facility] CATH LAB;  Service: Cardiovascular;  Laterality: Bilateral;  . Total hip arthroplasty Right     Dr. [name]    Social History   Social History  . Marital Status: Married    Spouse Name: N/A  . Number of Children: N/A  . Years of Education: N/A   Occupational History  . Not on file.   Social History Main Topics  . Smoking status: Never Smoker   . Smokeless tobacco: Former Systems developer    Types: [place] date: 09/04/1991  . Alcohol Use: 0.5 oz/week    1 Standard drinks or equivalent per week     Comment: occassionaly  . Drug Use: No  . Sexual Activity: Not Currently   Other Topics Concern  . Not on file   Social History Narrative   Lives in [place] with wife. Has  2 cats, dog died. Daughter in [place], 2 step-daughters. Retired from [employer], worked in Radio producer. Grew up in [place].    Current Outpatient Prescriptions on File Prior to Visit  Medication Sig Dispense Refill  . aspirin EC 81 MG tablet Take 81 mg by mouth every evening.    . cyclobenzaprine (FLEXERIL) 5 MG tablet TAKE 1 TABLET (5 MG TOTAL) BY MOUTH 3 (THREE) TIMES DAILY. 30 tablet 1  . MAGNESIUM-POTASSIUM PO Take by mouth.    . polyethylene glycol powder (GLYCOLAX/MIRALAX) powder TAKE 17 G BY MOUTH AS DIRECTED. 527 g 0  . simvastatin (ZOCOR) 40 MG tablet Take 1 tablet (40 mg total) by mouth at bedtime. 90 tablet 3  . testosterone cypionate (DEPOTESTOTERONE CYPIONATE) 200 MG/ML injection Inject into the muscle every 14 (fourteen) days.    . traMADol (ULTRAM) 50 MG tablet Take by mouth every 6 (six) hours as needed.     No current  facility-administered medications on file prior to visit.    Allergies  Allergen Reactions  . Ambien [Zolpidem Tartrate] Other (See Comments)    Super sensitive.   . Statins     REACTION: muscle aches...crestor  . Zolpidem Tartrate     Family History  Problem Relation Age of Onset  . Coronary artery disease Mother   . Heart disease Mother   . Coronary artery disease Sister   . Diabetes Sister   . Heart disease Sister 49    deceased  . Diabetes Father   . Heart disease Brother     BP 128/84 mmHg  Pulse 71  Temp(Src) 98.5 F (36.9 C) (Oral)  Ht 6\' 1"  (1.854 m)  Wt 172 lb (78.019 kg)  BMI 22.70 kg/m2  SpO2 96%  Review of Systems denies weight loss, headache, hoarseness, visual loss, palpitations, sob, diarrhea, polyuria, muscle weakness, excessive diaphoresis, tremor, anxiety, heat intolerance, and easy bruising.  Denies dysphagia, rhinorrhea, and sob.  I asked wife, who says pt has chronic low-back pain.      Objective:   Physical Exam VS: see vs page GEN: no distress HEAD: head: no deformity eyes: no periorbital swelling, no proptosis external nose and ears are normal mouth: no lesion seen NECK: thyroid is not enlarged.  I cannot appreciate the nodule.  CHEST [place]: no deformity.  LUNGS: clear to auscultation CV: reg rate and rhythm, no murmur MUSCULOSKELETAL: muscle bulk and strength are grossly normal.  no obvious joint swelling.  gait is normal and steady.  Pt is unable to lie flat for thyroid bx EXTEMITIES: no deformity.  no edema PULSES: no carotid bruit NEURO:  cn 2-12 grossly intact.   readily moves all 4's.  sensation is intact to touch on all 4's.   SKIN:  Normal texture and temperature.  No rash or suspicious lesion is visible.   NODES:  None palpable at the neck PSYCH: alert, well-oriented.  Does not appear anxious nor depressed.  Lab Results  Component Value Date   TSH 1.97 09/23/2014   Radiol: CT neck (09/29/14): 1 right thyroid nodule (unchanged  since 2011), and a left parotid stone.       Assessment & Plan:  Thyroid nodule, new to me: nonpalpable.  Stable x 5 years.  This and advanced age suggest no need for bx.  We discussed, and pt agrees.  OA spine: this limits positioning for bx.    Patient is advised the following: Patient Instructions  No further testing or treatment is needed for the lump in the thyroid. I  would be happy to see you back here as necessary.   Please let Dr Gilford Rile know if you want to see a specialist for the salivary gland.

## 2014-10-02 NOTE — Patient Instructions (Signed)
No further testing or treatment is needed for the lump in the thyroid. I would be happy to see you back here as necessary.   Please let Dr Gilford Rile know if you want to see a specialist for the salivary gland.

## 2014-10-29 ENCOUNTER — Ambulatory Visit: Payer: PPO | Admitting: Cardiovascular Disease

## 2014-11-04 ENCOUNTER — Ambulatory Visit (INDEPENDENT_AMBULATORY_CARE_PROVIDER_SITE_OTHER): Payer: PPO | Admitting: Cardiovascular Disease

## 2014-11-04 ENCOUNTER — Encounter: Payer: Self-pay | Admitting: Cardiovascular Disease

## 2014-11-04 VITALS — BP 130/88 | HR 78 | Ht 73.0 in | Wt 176.2 lb

## 2014-11-04 DIAGNOSIS — M546 Pain in thoracic spine: Secondary | ICD-10-CM

## 2014-11-04 DIAGNOSIS — I2510 Atherosclerotic heart disease of native coronary artery without angina pectoris: Secondary | ICD-10-CM

## 2014-11-04 DIAGNOSIS — I4891 Unspecified atrial fibrillation: Secondary | ICD-10-CM | POA: Diagnosis not present

## 2014-11-04 DIAGNOSIS — I10 Essential (primary) hypertension: Secondary | ICD-10-CM | POA: Diagnosis not present

## 2014-11-04 DIAGNOSIS — E785 Hyperlipidemia, unspecified: Secondary | ICD-10-CM

## 2014-11-04 DIAGNOSIS — Z951 Presence of aortocoronary bypass graft: Secondary | ICD-10-CM

## 2014-11-04 DIAGNOSIS — K5900 Constipation, unspecified: Secondary | ICD-10-CM

## 2014-11-04 DIAGNOSIS — Z23 Encounter for immunization: Secondary | ICD-10-CM

## 2014-11-04 DIAGNOSIS — M549 Dorsalgia, unspecified: Secondary | ICD-10-CM | POA: Insufficient documentation

## 2014-11-04 MED ORDER — EZETIMIBE 10 MG PO TABS: 10.0000 mg | ORAL_TABLET | Freq: Every day | ORAL | Status: DC

## 2014-11-04 NOTE — Assessment & Plan Note (Signed)
Blood pressure is well controlled on today's visit. No changes made to the medications. 

## 2014-11-04 NOTE — Assessment & Plan Note (Signed)
Currently with no symptoms of angina. No further workup at this time. Continue current medication regimen. 

## 2014-11-04 NOTE — Assessment & Plan Note (Signed)
Maintaining normal sinus rhythm. No changes to his medications. In the past did not tolerate beta blockers or calcium channel blockers 

## 2014-11-04 NOTE — Assessment & Plan Note (Signed)
Cholesterol is above goal, LDL in the 90s. He does not want Lipitor or Crestor, prefers to stay on simvastatin Recommended he watch his diet. Suggested he start zetia daily

## 2014-11-04 NOTE — Progress Notes (Signed)
Patient ID: [name], male    DOB: [birth year], 81 y.o.   MRN: [id number]  HPI Comments: [name] is a very pleasant 81 year old gentleman with a history of coronary artery disease, bypass surgery may 2014 . Also atrial fibrillation, previously on amiodarone He had a stent placed to his LAD in 1993, stent placed to his mid RCA in 2003, in-stent restenosis with repeat stent placed to his LAD in September 08, stent placed to his RCA at the same time, stress test in February 2009 and Jan 2014 which showed no ischemia,  history of low testosterone .  He presents for routine followup of his coronary artery disease  In follow-up today, his biggest complaint is back pain. He reports having long-standing back discomfort that comes and goes lower thoracic region on the right of his vertebrae Currently not having pain This seems to come and go, possibly worse after he sits at his computer for long hours. Possibly a bad position in the chair. Does not think his bed mattress is the right one for him but it is new within the past year, possibly too hard. Not doing any back exercises. Does do forever fit once per week  Cholesterol is above goal, LDL in the 90s. Total cholesterol less than 150  EKG on today's visit shows normal sinus rhythm with rate 78 bpm, no significant ST or T-wave changes  Other past medical history Previous episode of dizziness in 2015 dizziness. He was unable to get out of bed without assistance. Dizziness occurred while supine as well as sitting and standing. He went to the emergency room, was told that he could be dehydrated and was given IV fluids x2 bags. This seemed to improve his symptoms and was discharged home. Other workup in the hospital was negative including negative cardiac enzymes, normal basic metabolic panel, normal LFTs, normal EKG  Previously had chest pain in may 2014. Presented to [facility], cardiac catheterization showing subtotal occlusion of his LAD stent,  in-stent restenosis, severe OM1 and OM 2 disease also with RCA disease,  On  05/18/2012.  He underwent CABG x 4 utilizing LIMA to LAD, SVG to OM1, SVG to OM2, and SVG to PDA.  He also underwent Complete MAZE procedure with Clipping of Left Atrial Appendage.    He did not tolerate beta blockers or ACE inhibitor he had malaise, bradycardia. He took himself off these medications.   In  January 2014 he  had an episode of atrial fibrillation and presented to the emergency room. Converted back to normal sinus rhythm. Discharged on pradaxa. Recurrent episodes of atrial fibrillation shortly after that.   Allergies  Allergen Reactions  . Ambien [Zolpidem Tartrate] Other (See Comments)    Super sensitive.   . Statins     REACTION: muscle aches...crestor  . Zolpidem Tartrate     Outpatient Encounter Prescriptions as of 11/04/2014  Medication Sig  . Acetaminophen (TYLENOL PO) Take 2 tablets by mouth at bedtime as needed.  [name] aspirin EC 81 MG tablet Take 81 mg by mouth every evening.  [name] MAGNESIUM-POTASSIUM PO Take by mouth as needed.   . polyethylene glycol powder (GLYCOLAX/MIRALAX) powder TAKE 17 G BY MOUTH AS DIRECTED.  [name] simvastatin (ZOCOR) 40 MG tablet Take 1 tablet (40 mg total) by mouth at bedtime.  [name] testosterone cypionate (DEPOTESTOTERONE CYPIONATE) 200 MG/ML injection Inject into the muscle every 14 (fourteen) days.  [name] ezetimibe (ZETIA) 10 MG tablet Take 1 tablet (10 mg total) by mouth daily.  . [  DISCONTINUED] cyclobenzaprine (FLEXERIL) 5 MG tablet TAKE 1 TABLET (5 MG TOTAL) BY MOUTH 3 (THREE) TIMES DAILY. (Patient not taking: Reported on 11/04/2014)  . [DISCONTINUED] traMADol (ULTRAM) 50 MG tablet Take by mouth every 6 (six) hours as needed.   No facility-administered encounter medications on file as of 11/04/2014.    Past Medical History  Diagnosis Date  . Coronary artery disease     a. 1993 s/p PCI/BMS LAD;  b. 2003 PCI RCA;  c. 09/2006 ISR LAD->PCI, PCI RCA;  d. 02/2007 MV: no ischemia;   d. 01/2012 MV: EF 74%, no ischemia.  . Hyperlipidemia   . Hypertension   . Hyperthyroidism   . PAF (paroxysmal atrial fibrillation) ([place])     a. previously on amio ->stopped in 2013;  b. recurrent PAF 01/2012->Pradaxa and amio initiated, subsequently came off of pradaxa 2/2 cost;  c. 02/2012 Echo: EF 55-60%, Gr 1 DD.  [name] Low testosterone   . History of prostate cancer   . Prostate cancer ([place])   . S/P CABG x 4     Past Surgical History  Procedure Laterality Date  . Cardiac catheterization  2008  . Prostatectomy    . Hernia repair      s/p mesh bilaterally, Dr. Bary Castilla  . Appendectomy      Dr. Bary Castilla  . Colonoscopy  2013  . Intraoperative transesophageal echocardiogram N/A 05/18/2012    Procedure: INTRAOPERATIVE TRANSESOPHAGEAL ECHOCARDIOGRAM;  Surgeon: [name], MD;  Location: [place];  Service: Open Heart Surgery;  Laterality: N/A;  . Maze N/A 05/18/2012    Procedure: MAZE;  Surgeon: [name], MD;  Location: [place];  Service: Open Heart Surgery;  Laterality: N/A;  . Clipping of atrial appendage Left 05/18/2012    Procedure: CLIPPING OF ATRIAL APPENDAGE;  Surgeon: [name], MD;  Location: [place];  Service: Open Heart Surgery;  Laterality: Left;  . Coronary artery bypass graft N/A 05/18/2012    Procedure: CORONARY ARTERY BYPASS GRAFTING (CABG);  Surgeon: [name], MD;  Location: [place];  Service: Open Heart Surgery;  Laterality: N/A;  Times 4 using left internal mammary artery and endoscopically harvested right saphenous vein  . Left heart catheterization with coronary angiogram Bilateral 05/16/2012    Procedure: LEFT HEART CATHETERIZATION WITH CORONARY ANGIOGRAM;  Surgeon: Burnell Blanks, MD;  Location[facility] C[facility] LAB;  Service: Cardiovascular;  Laterality: Bilateral;  . Total hip arthroplasty Right     Dr. [name]    Social History  reports that he has never smoked. He quit smokeless tobacco use about 23 years ago. His smokeless tobacco use included Chew. He reports  that he drinks about 0.5 oz of alcohol per week. He reports that he does not use illicit drugs.  Family History family history includes Coronary artery disease in his mother and sister; Diabetes in his father and sister; Heart disease in his brother and mother; Heart disease (age of onset: 92) in his sister.   Review of Systems  Constitutional: Negative.   Respiratory: Negative.   Cardiovascular: Negative.   Gastrointestinal: Negative.   Musculoskeletal: Positive for back pain.  Skin: Negative.   Neurological: Negative.   Hematological: Negative.   Psychiatric/Behavioral: Negative.   All other systems reviewed and are negative.  BP 130/88 mmHg  Pulse 78  Ht 6\' 1"  (1.854 m)  Wt 176 lb 4 oz (79.946 kg)  BMI 23.26 kg/m2  Physical Exam  Constitutional: He is oriented to person, place, and time. He appears well-developed  and well-nourished.  HENT:  Head: Normocephalic.  Nose: Nose normal.  Mouth/Throat: Oropharynx is clear and moist.  Eyes: Conjunctivae are normal. Pupils are equal, round, and reactive to light.  Neck: Normal range of motion. Neck supple. No JVD present.  Cardiovascular: Normal rate, regular rhythm, S1 normal, S2 normal, normal heart sounds and intact distal pulses.  Exam reveals no gallop and no friction rub.   No murmur heard. Well healed mediastinal scar  Pulmonary/Chest: Effort normal and breath sounds normal. No respiratory distress. He has no wheezes. He has no rales. He exhibits no tenderness.  Abdominal: Soft. Bowel sounds are normal. He exhibits no distension. There is no tenderness.  Musculoskeletal: Normal range of motion. He exhibits no edema or tenderness.  Lymphadenopathy:    He has no cervical adenopathy.  Neurological: He is alert and oriented to person, place, and time. Coordination normal.  Skin: Skin is warm and dry. No rash noted. No erythema.  Psychiatric: He has a normal mood and affect. His behavior is normal. Judgment and thought content  normal.      Assessment and Plan   Nursing note and vitals reviewed.

## 2014-11-04 NOTE — Assessment & Plan Note (Signed)
Previously was doing well on Mira lax. For some reason he stopped this, now with constipation Suggested he restart miralex and Citrucel

## 2014-11-04 NOTE — Assessment & Plan Note (Signed)
Suggested he start some back exercises. Monitor his posture when he is sitting at the computer for long hours If symptoms persist, could try chiropractic

## 2014-11-04 NOTE — Assessment & Plan Note (Signed)
Currently with no symptoms of angina. No further testing at this time 

## 2014-11-04 NOTE — Patient Instructions (Addendum)
You are doing well.  Start zetia one a day when affordable  Consider miralex with citracel daily (Linzess)  Please call us if you have new issues that need to be addressed before your next appt.  Your physician wants you to follow-up in: 6 months.  You will receive a reminder letter in the mail two months in advance. If you don't receive a letter, please call our office to schedule the follow-up appointment.  Back Exercises The following exercises strengthen the muscles that help to support the back. They also help to keep the lower back flexible. Doing these exercises can help to prevent back pain or lessen existing pain. If you have back pain or discomfort, try doing these exercises 2-3 times each day or as told by your health care provider. When the pain goes away, do them once each day, but increase the number of times that you repeat the steps for each exercise (do more repetitions). If you do not have back pain or discomfort, do these exercises once each day or as told by your health care provider. EXERCISES Single Knee to Chest Repeat these steps 3-5 times for each leg: 1. Lie on your back on a firm bed or the floor with your legs extended. 2. Bring one knee to your chest. Your other leg should stay extended and in contact with the floor. 3. Hold your knee in place by grabbing your knee or thigh. 4. Pull on your knee until you feel a gentle stretch in your lower back. 5. Hold the stretch for 10-30 seconds. 6. Slowly release and straighten your leg. Pelvic Tilt Repeat these steps 5-10 times: 1. Lie on your back on a firm bed or the floor with your legs extended. 2. Bend your knees so they are pointing toward the ceiling and your feet are flat on the floor. 3. Tighten your lower abdominal muscles to press your lower back against the floor. This motion will tilt your pelvis so your tailbone points up toward the ceiling instead of pointing to your feet or the floor. 4. With gentle  tension and even breathing, hold this position for 5-10 seconds. Cat-Cow Repeat these steps until your lower back becomes more flexible: 1. Get into a hands-and-knees position on a firm surface. Keep your hands under your shoulders, and keep your knees under your hips. You may place padding under your knees for comfort. 2. Let your head hang down, and point your tailbone toward the floor so your lower back becomes rounded like the back of a cat. 3. Hold this position for 5 seconds. 4. Slowly lift your head and point your tailbone up toward the ceiling so your back forms a sagging arch like the back of a cow. 5. Hold this position for 5 seconds. Press-Ups Repeat these steps 5-10 times: 1. Lie on your abdomen (face-down) on the floor. 2. Place your palms near your head, about shoulder-width apart. 3. While you keep your back as relaxed as possible and keep your hips on the floor, slowly straighten your arms to raise the top half of your body and lift your shoulders. Do not use your back muscles to raise your upper torso. You may adjust the placement of your hands to make yourself more comfortable. 4. Hold this position for 5 seconds while you keep your back relaxed. 5. Slowly return to lying flat on the floor. Bridges Repeat these steps 10 times: 1. Lie on your back on a firm surface. 2. Bend your knees so  they are pointing toward the ceiling and your feet are flat on the floor. 3. Tighten your buttocks muscles and lift your buttocks off of the floor until your waist is at almost the same height as your knees. You should feel the muscles working in your buttocks and the back of your thighs. If you do not feel these muscles, slide your feet 1-2 inches farther away from your buttocks. 4. Hold this position for 3-5 seconds. 5. Slowly lower your hips to the starting position, and allow your buttocks muscles to relax completely. If this exercise is too easy, try doing it with your arms crossed over  your chest. Abdominal Crunches Repeat these steps 5-10 times: 1. Lie on your back on a firm bed or the floor with your legs extended. 2. Bend your knees so they are pointing toward the ceiling and your feet are flat on the floor. 3. Cross your arms over your chest. 4. Tip your chin slightly toward your chest without bending your neck. 5. Tighten your abdominal muscles and slowly raise your trunk (torso) high enough to lift your shoulder blades a tiny bit off of the floor. Avoid raising your torso higher than that, because it can put too much stress on your low back and it does not help to strengthen your abdominal muscles. 6. Slowly return to your starting position. Back Lifts Repeat these steps 5-10 times: 1. Lie on your abdomen (face-down) with your arms at your sides, and rest your forehead on the floor. 2. Tighten the muscles in your legs and your buttocks. 3. Slowly lift your chest off of the floor while you keep your hips pressed to the floor. Keep the back of your head in line with the curve in your back. Your eyes should be looking at the floor. 4. Hold this position for 3-5 seconds. 5. Slowly return to your starting position. SEEK MEDICAL CARE IF:And   Your back pain or discomfort gets much worse when you do an exercise.  Your back pain or discomfort does not lessen within 2 hours after you exercise. If you have any of these problems, stop doing these exercises right away. Do not do them again unless your health care provider says that you can. SEEK IMMEDIATE MEDICAL CARE IF:  You develop sudden, severe back pain. If this happens, stop doing the exercises right away. Do not do them again unless your health care provider says that you can.   This information is not intended to replace advice given to you by your health care provider. Make sure you discuss any questions you have with your health care provider.   Document Released: 02/04/2004 Document Revised: 09/17/2014 Document  Reviewed: 02/20/2014 Elsevier Interactive Patient Education [employer].

## 2014-11-15 IMAGING — CR DG CHEST 2V
2 series · 2 of 2 positions shown · non-contrast
Comparison: 02/18/2005; 04/09/2002

CLINICAL DATA: Chest pain, headache

CHEST - 2 VIEW

[w chest pa]
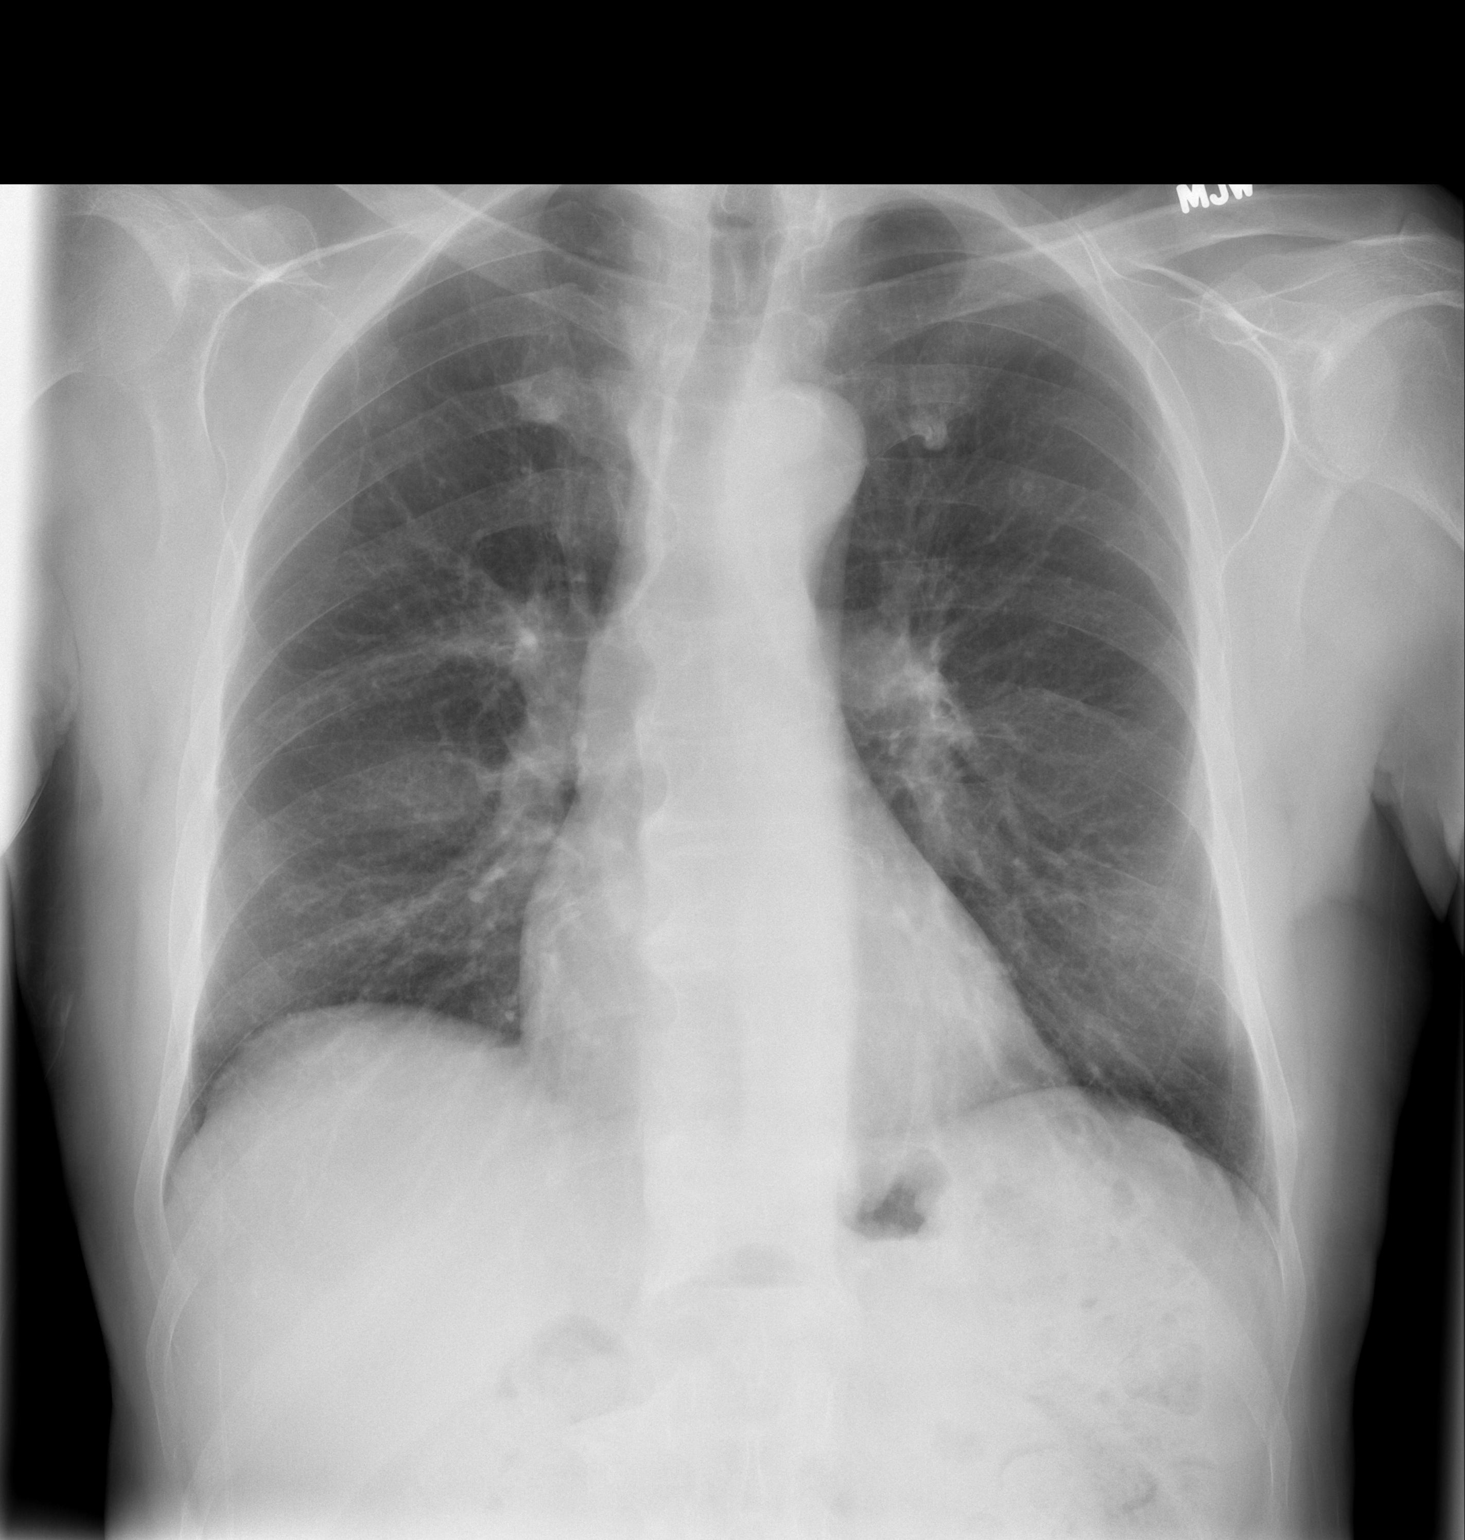

[w chest lat]
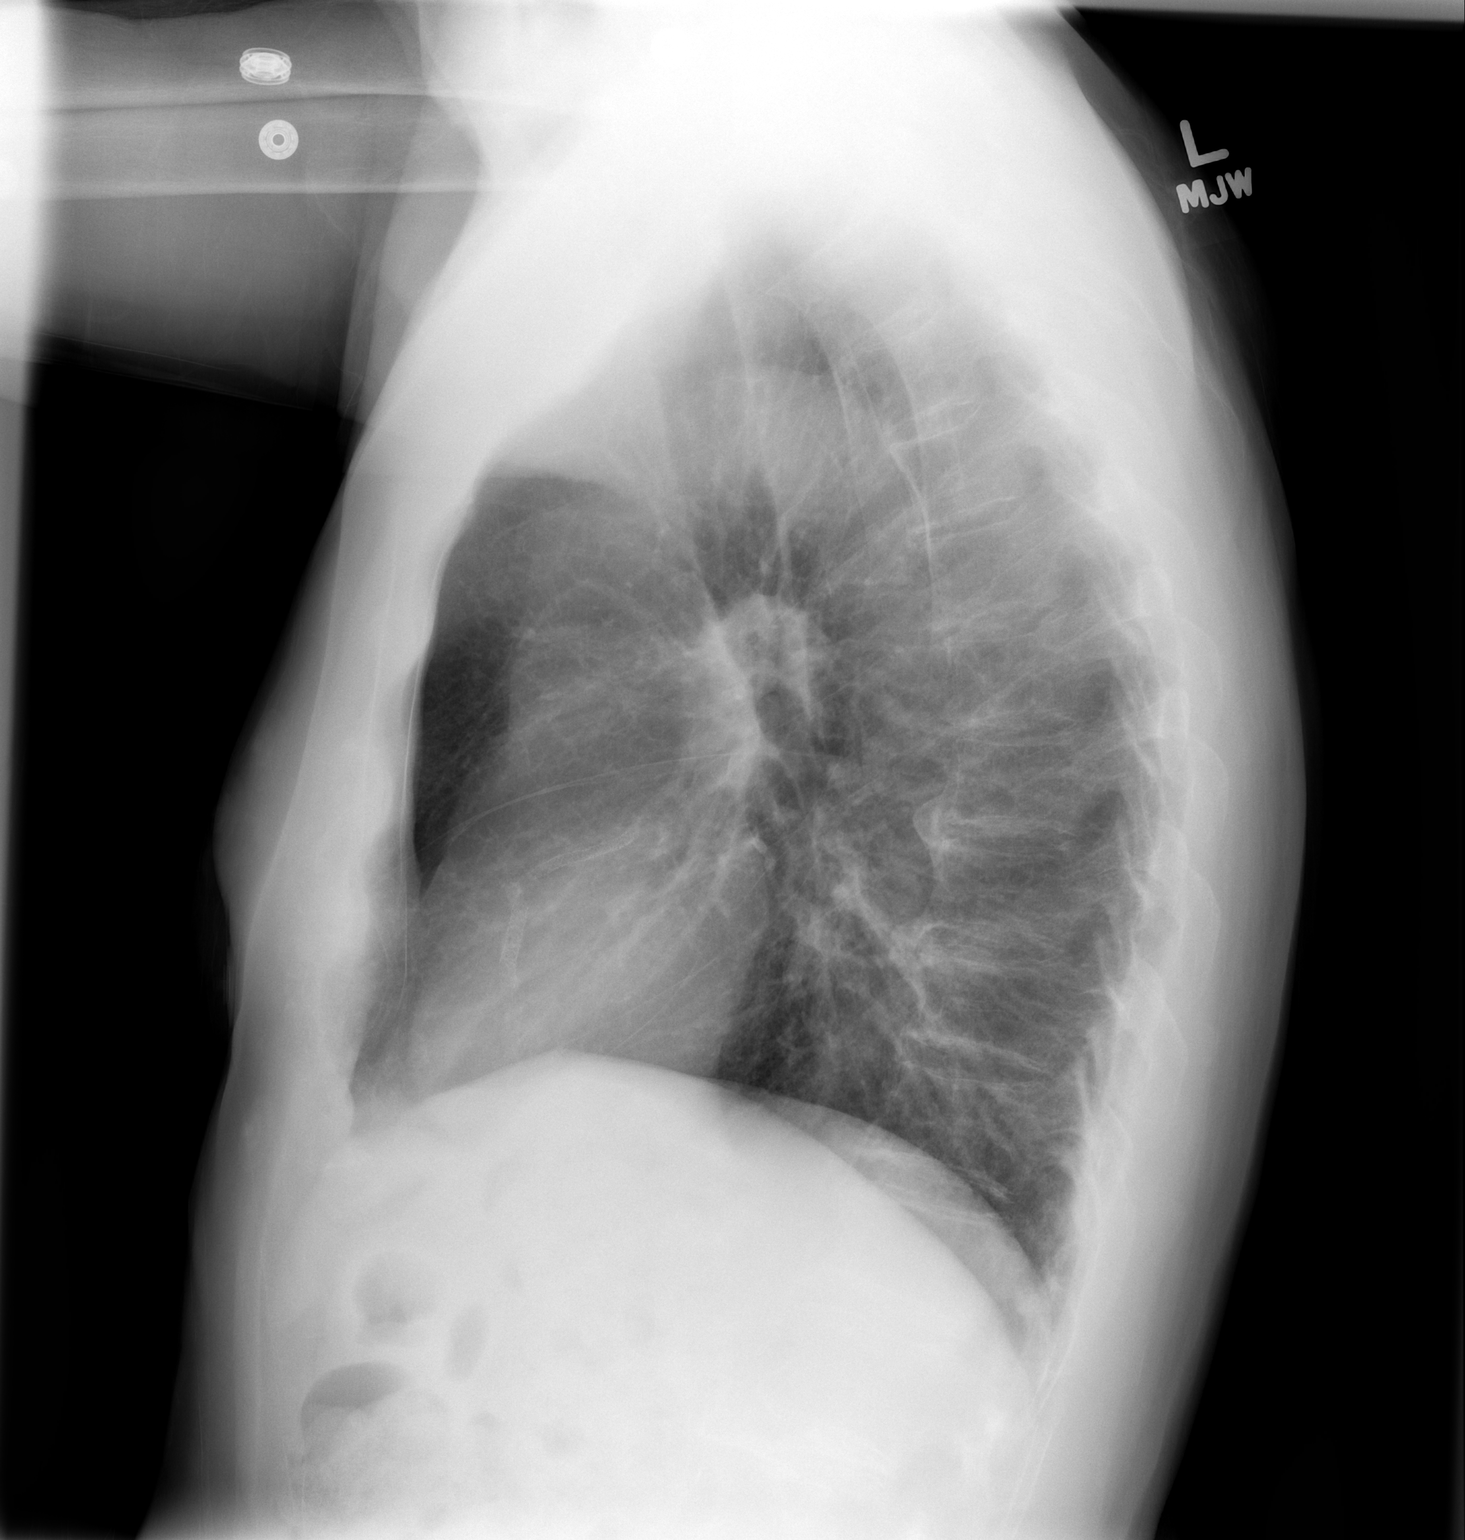

[2 of 2 positions shown; findings below may reference images not displayed]

FINDINGS: Grossly unchanged cardiac silhouette and mediastinal
contours.  The lungs appear hyperexpanded with flattening of the
bilateral hemidiaphragms mild diffuse thickening of the pulmonary
interstitium.  No focal airspace opacity.  No pleural effusion or
pneumothorax.  Unchanged bones.
IMPRESSION: Mild lung hyperexpansion of bronchitic change without acute
cardiopulmonary disease.

## 2014-11-24 ENCOUNTER — Other Ambulatory Visit: Payer: Self-pay | Admitting: Internal Medicine

## 2014-11-28 ENCOUNTER — Ambulatory Visit (INDEPENDENT_AMBULATORY_CARE_PROVIDER_SITE_OTHER): Payer: PPO

## 2014-11-28 VITALS — BP 138/72 | HR 65 | Temp 98.0°F | Resp 14 | Ht 73.0 in | Wt 173.8 lb

## 2014-11-28 DIAGNOSIS — Z0000 Encounter for general adult medical examination without abnormal findings: Secondary | ICD-10-CM | POA: Diagnosis not present

## 2014-11-28 NOTE — Patient Instructions (Addendum)
[name],  Thank you for taking time to come for your Medicare Wellness Visit.  I appreciate your ongoing commitment to your health goals. Please review the following plan we discussed and let me know if I can assist you in the future.  Follow up with PCP needed   Fall Prevention in the Home  Falls can cause injuries. They can happen to people of all ages. There are many things you can do to make your home safe and to help prevent falls.  WHAT CAN I DO ON THE OUTSIDE OF MY HOME?  Regularly fix the edges of walkways and driveways and fix any cracks.  Remove anything that might make you trip as you walk through a door, such as a raised step or threshold.  Trim any bushes or trees on the path to your home.  Use bright outdoor lighting.  Clear any walking paths of anything that might make someone trip, such as rocks or tools.  Regularly check to see if handrails are loose or broken. Make sure that both sides of any steps have handrails.  Any raised decks and porches should have guardrails on the edges.  Have any leaves, snow, or ice cleared regularly.  Use sand or salt on walking paths during winter.  Clean up any spills in your garage right away. This includes oil or grease spills. WHAT CAN I DO IN THE BATHROOM?   Use night lights.  Install grab bars by the toilet and in the tub and shower. Do not use towel bars as grab bars.  Use non-skid mats or decals in the tub or shower.  If you need to sit down in the shower, use a plastic, non-slip stool.  Keep the floor dry. Clean up any water that spills on the floor as soon as it happens.  Remove soap buildup in the tub or shower regularly.  Attach bath mats securely with double-sided non-slip rug tape.  Do not have throw rugs and other things on the floor that can make you trip. WHAT CAN I DO IN THE BEDROOM?  Use night lights.  Make sure that you have a light by your bed that is easy to reach.  Do not use any sheets or  blankets that are too big for your bed. They should not hang down onto the floor.  Have a firm chair that has side arms. You can use this for support while you get dressed.  Do not have throw rugs and other things on the floor that can make you trip. WHAT CAN I DO IN THE KITCHEN?  Clean up any spills right away.  Avoid walking on wet floors.  Keep items that you use a lot in easy-to-reach places.  If you need to reach something above you, use a strong step stool that has a grab bar.  Keep electrical cords out of the way.  Do not use floor polish or wax that makes floors slippery. If you must use wax, use non-skid floor wax.  Do not have throw rugs and other things on the floor that can make you trip. WHAT CAN I DO WITH MY STAIRS?  Do not leave any items on the stairs.  Make sure that there are handrails on both sides of the stairs and use them. Fix handrails that are broken or loose. Make sure that handrails are as long as the stairways.  Check any carpeting to make sure that it is firmly attached to the stairs. Fix any carpet that  is loose or worn.  Avoid having throw rugs at the top or bottom of the stairs. If you do have throw rugs, attach them to the floor with carpet tape.  Make sure that you have a light switch at the top of the stairs and the bottom of the stairs. If you do not have them, ask someone to add them for you. WHAT ELSE CAN I DO TO HELP PREVENT FALLS?  Wear shoes that:  Do not have high heels.  Have rubber bottoms.  Are comfortable and fit you well.  Are closed at the toe. Do not wear sandals.  If you use a stepladder:  Make sure that it is fully opened. Do not climb a closed stepladder.  Make sure that both sides of the stepladder are locked into place.  Ask someone to hold it for you, if possible.  Clearly mark and make sure that you can see:  Any grab bars or handrails.  First and last steps.  Where the edge of each step is.  Use tools  that help you move around (mobility aids) if they are needed. These include:  Canes.  Walkers.  Scooters.  Crutches.  Turn on the lights when you go into a dark area. Replace any light bulbs as soon as they burn out.  Set up your furniture so you have a clear path. Avoid moving your furniture around.  If any of your floors are uneven, fix them.  If there are any pets around you, be aware of where they are.  Review your medicines with your doctor. Some medicines can make you feel dizzy. This can increase your chance of falling. Ask your doctor what other things that you can do to help prevent falls.   This information is not intended to replace advice given to you by your health care provider. Make sure you discuss any questions you have with your health care provider.   Document Released: 10/23/2008 Document Revised: 05/13/2014 Document Reviewed: 01/31/2014 Elsevier Interactive Patient Education [employer].

## 2014-11-28 NOTE — Progress Notes (Signed)
Subjective:   [name] is a 81 y.o. male who presents for Medicare Annual/Subsequent preventive examination.  Review of Systems:  No ROS.  Medicare Wellness Visit.  Cardiac Risk Factors include: advanced age (>38men, >79 women);male gender     Objective:    Vitals: BP 138/72 mmHg  Pulse 65  Temp(Src) 98 F (36.7 C) (Oral)  Resp 14  Ht 6\' 1"  (1.854 m)  Wt 173 lb 12.8 oz (78.835 kg)  BMI 22.94 kg/m2  SpO2 97%  Tobacco History  Smoking status  . Never Smoker   Smokeless tobacco  . Former Systems developer  . Types: Chew  . Quit date: 09/04/1991     Counseling given: Not Answered   Past Medical History  Diagnosis Date  . Coronary artery disease     a. 1993 s/p PCI/BMS LAD;  b. 2003 PCI RCA;  c. 09/2006 ISR LAD->PCI, PCI RCA;  d. 02/2007 MV: no ischemia;  d. 01/2012 MV: EF 74%, no ischemia.  . Hyperlipidemia   . Hypertension   . Hyperthyroidism   . PAF (paroxysmal atrial fibrillation) ([place])     a. previously on amio ->stopped in 2013;  b. recurrent PAF 01/2012->Pradaxa and amio initiated, subsequently came off of pradaxa 2/2 cost;  c. 02/2012 Echo: EF 55-60%, Gr 1 DD.  [name] Low testosterone   . History of prostate cancer   . Prostate cancer ([place])   . S/P CABG x 4    Past Surgical History  Procedure Laterality Date  . Cardiac catheterization  2008  . Prostatectomy    . Hernia repair      s/p mesh bilaterally, Dr. Bary Castilla  . Appendectomy      Dr. Bary Castilla  . Colonoscopy  2013  . Intraoperative transesophageal echocardiogram N/A 05/18/2012    Procedure: INTRAOPERATIVE TRANSESOPHAGEAL ECHOCARDIOGRAM;  Surgeon: [name], MD;  Location: [facility];  Service: Open Heart Surgery;  Laterality: N/A;  . Maze N/A 05/18/2012    Procedure: MAZE;  Surgeon: [name], MD;  Location: [facility];  Service: Open Heart Surgery;  Laterality: N/A;  . Clipping of atrial appendage Left 05/18/2012    Procedure: CLIPPING OF ATRIAL APPENDAGE;  Surgeon: [name], MD;  Location: [facility];  Service:  Open Heart Surgery;  Laterality: Left;  . Coronary artery bypass graft N/A 05/18/2012    Procedure: CORONARY ARTERY BYPASS GRAFTING (CABG);  Surgeon: [name], MD;  Location: [facility];  Service: Open Heart Surgery;  Laterality: N/A;  Times 4 using left internal mammary artery and endoscopically harvested right saphenous vein  . Left heart catheterization with coronary angiogram Bilateral 05/16/2012    Procedure: LEFT HEART CATHETERIZATION WITH CORONARY ANGIOGRAM;  Surgeon: Burnell Blanks, MD;  Location: [facility] CATH LAB;  Service: Cardiovascular;  Laterality: Bilateral;  . Total hip arthroplasty Right     Dr. [name]   Family History  Problem Relation Age of Onset  . Coronary artery disease Mother   . Heart disease Mother   . Coronary artery disease Sister   . Diabetes Sister   . Heart disease Sister 51    deceased  . Diabetes Father   . Heart disease Brother    History  Sexual Activity  . Sexual Activity: Not Currently    Outpatient Encounter Prescriptions as of 11/28/2014  Medication Sig  . Acetaminophen (TYLENOL PO) Take 2 tablets by mouth at bedtime as needed.  [name] aspirin EC 81 MG tablet Take 81 mg by mouth every evening.  [name]  MAGNESIUM-POTASSIUM PO Take by mouth as needed.   . polyethylene glycol powder (GLYCOLAX/MIRALAX) powder TAKE 17 G BY MOUTH AS DIRECTED.  [name] simvastatin (ZOCOR) 40 MG tablet Take 1 tablet (40 mg total) by mouth at bedtime.  [name] testosterone cypionate (DEPOTESTOTERONE CYPIONATE) 200 MG/ML injection Inject into the muscle every 14 (fourteen) days.  [name] ezetimibe (ZETIA) 10 MG tablet Take 1 tablet (10 mg total) by mouth daily. (Patient not taking: Reported on 11/28/2014)   No facility-administered encounter medications on file as of 11/28/2014.    Activities of Daily Living In your present state of health, do you have any difficulty performing the following activities: 11/28/2014  Hearing? Y  Vision? N  Difficulty concentrating or making decisions? Y  Walking  or climbing stairs? N  Dressing or bathing? N  Doing errands, shopping? N  Preparing Food and eating ? Y  Using the Toilet? N  In the past six months, have you accidently leaked urine? N  Do you have problems with loss of bowel control? N  Managing your Medications? N  Managing your Finances? N  Housekeeping or managing your Housekeeping? N    Patient Care Team: Jackolyn Confer, MD as PCP - General (Internal Medicine)   Assessment:    This is a routine wellness examination for Delman. The goal of the wellness visit is to assist the patient how to close the gaps in care and create a preventative care plan for the patient.   Osteoporosis risk discussed.  Taking meds without issues; no barriers identified.   Safety issues reviewed; smoke detectors in the home. Firearms locked in a secure area. Wears seatbelts when driving or riding with others. No violence in the home.  No identified risk were noted; The patient was oriented x 3; appropriate in dress and manner and no objective failures at ADL's or IADL's.   Patient Concerns: Constipated all the time.  Already exercising regularly, drinking plenty of water, and eating foods with fiber. Deferred to PCP for follow up.  Declined making a follow up appointment today.    Exercise Activities and Dietary recommendations Current Exercise Habits:: Structured exercise class, Type of exercise: calisthenics, Time (Minutes): 15, Frequency (Times/Week): 2, Weekly Exercise (Minutes/Week): 30, Intensity: Mild  Goals    . Increase physical activity     Currently uses the exercise machines at fitness center at [facility] 2x weekly for 15 minutes.  Add the treadmill to the routine for cardiovascular, as tolerated.      Fall Risk Fall Risk  11/28/2014 09/23/2014 08/21/2013 02/19/2013  Falls in the past year? Yes Yes No No  Number falls in past yr: 1 1 - -  Injury with Fall? Yes Yes - -  Follow up Education provided;Falls prevention discussed - -  -   Depression Screen PHQ 2/9 Scores 11/28/2014 09/23/2014 08/21/2013 02/19/2013  PHQ - 2 Score 0 0 0 0    Cognitive Testing MMSE - Mini Mental State Exam 11/28/2014  Orientation to time 5  Orientation to Place 5  Registration 3  Attention/ Calculation 5  Recall 3  Language- name 2 objects 2  Language- repeat 1  Language- follow 3 step command 3  Language- read & follow direction 1  Write a sentence 1  Copy design 1  Total score 30    Immunization History  Administered Date(s) Administered  . Influenza Split 10/05/2010, 10/27/2011, 10/10/2013  . Influenza,inj,Quad PF,36+ Mos 09/18/2012, 11/04/2014  . Pneumococcal Conjugate-13 02/19/2013  . Pneumococcal Polysaccharide-23 11/15/2007   Screening  Tests Health Maintenance  Topic Date Due  . ZOSTAVAX  11/11/2015 (Originally 12/02/1994)  . TETANUS/TDAP  11/11/2015 (Originally 12/01/1951)  . INFLUENZA VACCINE  08/11/2015  . PNA vac Low Risk Adult  Completed      Plan:    End of life planning; Advance aging; Advanced Directives was discussed. Education provided to help continue the conversation with his family. He plans to share the information with his wife and return a completed copy. Time spent discussing [place] of Attorney/Living Will short form is 19 minutes.  During the course of the visit the patient was educated and counseled about the following appropriate screening and preventive services:   Vaccines to include Pneumoccal, Influenza, Hepatitis B, Td, Zostavax, HCV  Electrocardiogram  Cardiovascular Disease  Colorectal cancer screening  Diabetes screening  Prostate Cancer Screening  Glaucoma screening  Nutrition counseling   Smoking cessation counseling  Patient Instructions (the written plan) was given to the patient.    Varney Biles, LPN  [id number]

## 2014-11-28 NOTE — Progress Notes (Signed)
Annual Wellness Visit as completed by Health Coach was reviewed in full.  

## 2014-12-17 ENCOUNTER — Ambulatory Visit: Payer: PPO | Admitting: Cardiovascular Disease

## 2015-02-25 ENCOUNTER — Ambulatory Visit (INDEPENDENT_AMBULATORY_CARE_PROVIDER_SITE_OTHER): Payer: PPO | Admitting: Internal Medicine

## 2015-02-25 ENCOUNTER — Encounter: Payer: Self-pay | Admitting: Internal Medicine

## 2015-02-25 VITALS — BP 139/71 | HR 76 | Temp 98.1°F | Wt 170.1 lb

## 2015-02-25 DIAGNOSIS — J209 Acute bronchitis, unspecified: Secondary | ICD-10-CM | POA: Insufficient documentation

## 2015-02-25 MED ORDER — AMOXICILLIN-POT CLAVULANATE 875-125 MG PO TABS: 1.0000 | ORAL_TABLET | Freq: Two times a day (BID) | ORAL | Status: DC

## 2015-02-25 MED ORDER — HYDROCODONE-HOMATROPINE 5-1.5 MG/5ML PO SYRP: 5.0000 mL | ORAL_SOLUTION | Freq: Three times a day (TID) | ORAL | Status: DC | PRN

## 2015-02-25 NOTE — Assessment & Plan Note (Signed)
Symptoms and exam consistent with bronchitis. Will start Augmentin and prn Hycodan. Follow up if symptoms are not improving.

## 2015-02-25 NOTE — Patient Instructions (Signed)
Start Augmentin twice daily.  Increase fluid intake, rest.  Use Hycodan as needed for cough.

## 2015-02-25 NOTE — Progress Notes (Signed)
Pre visit review using our clinic review tool, if applicable. No additional management support is needed unless otherwise documented below in the visit note. 

## 2015-02-25 NOTE — Progress Notes (Signed)
Subjective:    Patient ID: [name], male    DOB: 11/23/1932, 82 y.o.   MRN: [id number]  HPI  82YO male presents for acute visit.  Flu like symptoms, headache, cough x 1 week. Fever, chills initially, now improved. Cough is wet with some mucous production. Keeps him awake at night. Wife also sick. No dyspnea. No NVD. Some back pain. Taking some Mucinex. No improvement.  Wt Readings from Last 3 Encounters:  02/25/15 170 lb 2 oz (77.168 kg)  11/28/14 173 lb 12.8 oz (78.835 kg)  11/04/14 176 lb 4 oz (79.946 kg)   BP Readings from Last 3 Encounters:  02/25/15 139/71  11/28/14 138/72  11/04/14 130/88    Past Medical History  Diagnosis Date  . Coronary artery disease     a. 1993 s/p PCI/BMS LAD;  b. 2003 PCI RCA;  c. 09/2006 ISR LAD->PCI, PCI RCA;  d. 02/2007 MV: no ischemia;  d. 01/2012 MV: EF 74%, no ischemia.  . Hyperlipidemia   . Hypertension   . Hyperthyroidism   . PAF (paroxysmal atrial fibrillation) ([place])     a. previously on amio ->stopped in 2013;  b. recurrent PAF 01/2012->Pradaxa and amio initiated, subsequently came off of pradaxa 2/2 cost;  c. 02/2012 Echo: EF 55-60%, Gr 1 DD.  [name] Low testosterone   . History of prostate cancer   . Prostate cancer ([place])   . S/P CABG x 4    Family History  Problem Relation Age of Onset  . Coronary artery disease Mother   . Heart disease Mother   . Coronary artery disease Sister   . Diabetes Sister   . Heart disease Sister 42    deceased  . Diabetes Father   . Heart disease Brother    Past Surgical History  Procedure Laterality Date  . Cardiac catheterization  2008  . Prostatectomy    . Hernia repair      s/p mesh bilaterally, Dr. Bary Castilla  . Appendectomy      Dr. Bary Castilla  . Colonoscopy  2013  . Intraoperative transesophageal echocardiogram N/A 05/18/2012    Procedure: INTRAOPERATIVE TRANSESOPHAGEAL ECHOCARDIOGRAM;  Surgeon: [name], MD;  Location: [place];  Service: Open Heart Surgery;  Laterality: N/A;  . Maze  N/A 05/18/2012    Procedure: MAZE;  Surgeon: [name], MD;  Location: [facility];  Service: Open Heart Surgery;  Laterality: N/A;  . Clipping of atrial appendage Left 05/18/2012    Procedure: CLIPPING OF ATRIAL APPENDAGE;  Surgeon: [name], MD;  Location: [facility];  Service: Open Heart Surgery;  Laterality: Left;  . Coronary artery bypass graft N/A 05/18/2012    Procedure: CORONARY ARTERY BYPASS GRAFTING (CABG);  Surgeon: [name], MD;  Location: [facility];  Service: Open Heart Surgery;  Laterality: N/A;  Times 4 using left internal mammary artery and endoscopically harvested right saphenous vein  . Left heart catheterization with coronary angiogram Bilateral 05/16/2012    Procedure: LEFT HEART CATHETERIZATION WITH CORONARY ANGIOGRAM;  Surgeon: Burnell Blanks, MD;  Location[facility] CATH LAB;  Service: Cardiovascular;  Laterality: Bilateral;  . Total hip arthroplasty Right     Dr. [name]   Social History   Social History  . Marital Status: Married    Spouse Name: N/A  . Number of Children: N/A  . Years of Education: N/A   Social History Main Topics  . Smoking status: Never Smoker   . Smokeless tobacco: Former Systems developer    Types:  Sarina Ser    Quit date: 09/04/1991  . Alcohol Use: 0.5 oz/week    1 Standard drinks or equivalent per week     Comment: occassionaly  . Drug Use: No  . Sexual Activity: Not Currently   Other Topics Concern  . None   Social History Narrative   Lives in [place] with wife. Has 2 cats, dog died. Daughter in [place], 2 step-daughters. Retired from [employer], worked in Radio producer. Grew up in [place].    Review of Systems  Constitutional: Positive for fatigue. Negative for fever, chills and activity change.  HENT: Positive for congestion, postnasal drip and rhinorrhea. Negative for ear discharge, ear pain, hearing loss, nosebleeds, sinus pressure, sneezing, sore throat, tinnitus, trouble swallowing and voice change.   Eyes: Negative for discharge, redness, itching and  visual disturbance.  Respiratory: Positive for cough. Negative for chest tightness, shortness of breath, wheezing and stridor.   Cardiovascular: Negative for chest pain and leg swelling.  Musculoskeletal: Positive for back pain. Negative for myalgias, arthralgias, neck pain and neck stiffness.  Skin: Negative for color change and rash.  Neurological: Negative for dizziness, facial asymmetry and headaches.  Psychiatric/Behavioral: Negative for sleep disturbance.       Objective:    BP 139/71 mmHg  Pulse 76  Temp(Src) 98.1 F (36.7 C) (Oral)  Wt 170 lb 2 oz (77.168 kg)  SpO2 97% Physical Exam  Constitutional: He is oriented to person, place, and time. He appears well-developed and well-nourished. No distress.  HENT:  Head: Normocephalic and atraumatic.  Right Ear: External ear normal.  Left Ear: External ear normal.  Nose: Nose normal.  Mouth/Throat: Oropharynx is clear and moist. No oropharyngeal exudate.  Eyes: Conjunctivae and EOM are normal. Pupils are equal, round, and reactive to light. Right eye exhibits no discharge. Left eye exhibits no discharge. No scleral icterus.  Neck: Normal range of motion. Neck supple. No tracheal deviation present. No thyromegaly present.  Cardiovascular: Normal rate, regular rhythm and normal heart sounds.  Exam reveals no gallop and no friction rub.   No murmur heard. Pulmonary/Chest: Effort normal. No accessory muscle usage. No tachypnea. No respiratory distress. He has no decreased breath sounds. He has no wheezes. He has rhonchi in the right middle field and the right lower field. He has no rales. He exhibits no tenderness.  Musculoskeletal: Normal range of motion. He exhibits no edema.  Lymphadenopathy:    He has no cervical adenopathy.  Neurological: He is alert and oriented to person, place, and time. No cranial nerve deficit. Coordination normal.  Skin: Skin is warm and dry. No rash noted. He is not diaphoretic. No erythema. No pallor.    Psychiatric: He has a normal mood and affect. His behavior is normal. Judgment and thought content normal.          Assessment & Plan:   Problem List Items Addressed This Visit      Unprioritized   Acute bronchitis - Primary    Symptoms and exam consistent with bronchitis. Will start Augmentin and prn Hycodan. Follow up if symptoms are not improving.      Relevant Medications   amoxicillin-clavulanate (AUGMENTIN) 875-125 MG tablet   HYDROcodone-homatropine (HYCODAN) 5-1.5 MG/5ML syrup       Return if symptoms worsen or fail to improve.

## 2015-04-14 ENCOUNTER — Encounter: Payer: Self-pay | Admitting: Internal Medicine

## 2015-04-14 ENCOUNTER — Ambulatory Visit (INDEPENDENT_AMBULATORY_CARE_PROVIDER_SITE_OTHER): Payer: PPO | Admitting: Internal Medicine

## 2015-04-14 ENCOUNTER — Ambulatory Visit: Payer: PPO | Admitting: Internal Medicine

## 2015-04-14 VITALS — BP 139/75 | HR 72 | Temp 97.7°F | Ht 73.0 in | Wt 166.0 lb

## 2015-04-14 DIAGNOSIS — K5900 Constipation, unspecified: Secondary | ICD-10-CM | POA: Diagnosis not present

## 2015-04-14 DIAGNOSIS — C61 Malignant neoplasm of prostate: Secondary | ICD-10-CM

## 2015-04-14 DIAGNOSIS — R739 Hyperglycemia, unspecified: Secondary | ICD-10-CM

## 2015-04-14 DIAGNOSIS — R7309 Other abnormal glucose: Secondary | ICD-10-CM | POA: Diagnosis not present

## 2015-04-14 LAB — CBC WITH DIFFERENTIAL/PLATELET
Basophils Absolute: 0 10*3/uL (ref 0.0–0.1)
Basophils Relative: 0.3 % (ref 0.0–3.0)
EOS ABS: 0.1 10*3/uL (ref 0.0–0.7)
Eosinophils Relative: 1.3 % (ref 0.0–5.0)
HCT: 43.7 % (ref 39.0–52.0)
HEMOGLOBIN: 14.6 g/dL (ref 13.0–17.0)
Lymphocytes Relative: 18.3 % (ref 12.0–46.0)
Lymphs Abs: 1.2 10*3/uL (ref 0.7–4.0)
MCHC: 33.4 g/dL (ref 30.0–36.0)
MCV: 83.9 fl (ref 78.0–100.0)
MONO ABS: 0.4 10*3/uL (ref 0.1–1.0)
Monocytes Relative: 6.7 % (ref 3.0–12.0)
Neutro Abs: 4.9 10*3/uL (ref 1.4–7.7)
Neutrophils Relative %: 73.4 % (ref 43.0–77.0)
Platelets: 218 10*3/uL (ref 150.0–400.0)
RBC: 5.2 Mil/uL (ref 4.22–5.81)
RDW: 14.1 % (ref 11.5–15.5)
WBC: 6.7 10*3/uL (ref 4.0–10.5)

## 2015-04-14 LAB — COMPREHENSIVE METABOLIC PANEL
ALBUMIN: 4.6 g/dL (ref 3.5–5.2)
ALK PHOS: 74 U/L (ref 39–117)
ALT: 12 U/L (ref 0–53)
AST: 17 U/L (ref 0–37)
BILIRUBIN TOTAL: 1.2 mg/dL (ref 0.2–1.2)
BUN: 20 mg/dL (ref 6–23)
CALCIUM: 9.5 mg/dL (ref 8.4–10.5)
CO2: 29 meq/L (ref 19–32)
Chloride: 102 mEq/L (ref 96–112)
Creatinine, Ser: 1.03 mg/dL (ref 0.40–1.50)
GFR: 73.79 mL/min (ref >60.00)
Glucose, Bld: 120 mg/dL — ABNORMAL HIGH (ref 70–99)
Potassium: 4.3 mEq/L (ref 3.5–5.1)
Sodium: 139 mEq/L (ref 135–145)
TOTAL PROTEIN: 7.3 g/dL (ref 6.0–8.3)

## 2015-04-14 LAB — HEMOGLOBIN A1C: Hgb A1c MFr Bld: 5.9 % (ref 4.6–6.5)

## 2015-04-14 LAB — PSA, MEDICARE: PSA: 0 ng/ml — ABNORMAL LOW (ref 0.10–4.00)

## 2015-04-14 MED ORDER — LACTULOSE 10 GM/15ML PO SOLN: 20.0000 g | Freq: Two times a day (BID) | ORAL | Status: DC | PRN

## 2015-04-14 NOTE — Assessment & Plan Note (Signed)
Recent constipation. Exam is benign with normal bowel sounds. Will try Lactulose today. If no BM, then we discussed get plain xray or CT abdomen for further evaluation.

## 2015-04-14 NOTE — Patient Instructions (Signed)
Take Lactulose 41ml x1 then if no bowel movement in 2hr, repeat dose.  Please email with update later today.  If no improvement, we will order imaging for further evaluation.

## 2015-04-14 NOTE — Assessment & Plan Note (Signed)
Previously elevated BG. Will check A1c with labs.

## 2015-04-14 NOTE — Progress Notes (Signed)
Pre visit review using our clinic review tool, if applicable. No additional management support is needed unless otherwise documented below in the visit note. 

## 2015-04-14 NOTE — Progress Notes (Signed)
Subjective:    Patient ID: [name], male    DOB: [birth year], 82 y.o.   MRN: [id number]  HPI  82YO male presents for acute visit.  Constipation - No BM over about 5 days. Prior to that, large BM after eating blueberries. No NV. No abdominal pain. No blood in stool. Tried Ducolax, Fleets enema with no improvement.  He has cut out all processed sugars from his diet and notes some weight loss with this.   Wt Readings from Last 3 Encounters:  04/14/15 166 lb (75.297 kg)  02/25/15 170 lb 2 oz (77.168 kg)  11/28/14 173 lb 12.8 oz (78.835 kg)   BP Readings from Last 3 Encounters:  04/14/15 139/75  02/25/15 139/71  11/28/14 138/72    Past Medical History  Diagnosis Date  . Coronary artery disease     a. 1993 s/p PCI/BMS LAD;  b. 2003 PCI RCA;  c. 09/2006 ISR LAD->PCI, PCI RCA;  d. 02/2007 MV: no ischemia;  d. 01/2012 MV: EF 74%, no ischemia.  . Hyperlipidemia   . Hypertension   . Hyperthyroidism   . PAF (paroxysmal atrial fibrillation) ([place])     a. previously on amio ->stopped in 2013;  b. recurrent PAF 01/2012->Pradaxa and amio initiated, subsequently came off of pradaxa 2/2 cost;  c. 02/2012 Echo: EF 55-60%, Gr 1 DD.  [name] Low testosterone   . History of prostate cancer   . Prostate cancer ([place])   . S/P CABG x 4    Family History  Problem Relation Age of Onset  . Coronary artery disease Mother   . Heart disease Mother   . Coronary artery disease Sister   . Diabetes Sister   . Heart disease Sister 25    deceased  . Diabetes Father   . Heart disease Brother    Past Surgical History  Procedure Laterality Date  . Cardiac catheterization  2008  . Prostatectomy    . Hernia repair      s/p mesh bilaterally, Dr. Bary Castilla  . Appendectomy      Dr. Bary Castilla  . Colonoscopy  2013  . Intraoperative transesophageal echocardiogram N/A 05/18/2012    Procedure: INTRAOPERATIVE TRANSESOPHAGEAL ECHOCARDIOGRAM;  Surgeon: [name], MD;  Location: [place];  Service: Open Heart Surgery;   Laterality: N/A;  . Maze N/A 05/18/2012    Procedure: MAZE;  Surgeon: [name], MD;  Location: [place];  Service: Open Heart Surgery;  Laterality: N/A;  . Clipping of atrial appendage Left 05/18/2012    Procedure: CLIPPING OF ATRIAL APPENDAGE;  Surgeon: [name], MD;  Location: [place];  Service: Open Heart Surgery;  Laterality: Left;  . Coronary artery bypass graft N/A 05/18/2012    Procedure: CORONARY ARTERY BYPASS GRAFTING (CABG);  Surgeon: [name], MD;  Location: [place];  Service: Open Heart Surgery;  Laterality: N/A;  Times 4 using left internal mammary artery and endoscopically harvested right saphenous vein  . Left heart catheterization with coronary angiogram Bilateral 05/16/2012    Procedure: LEFT HEART CATHETERIZATION WITH CORONARY ANGIOGRAM;  Surgeon: Burnell Blanks, MD;  [facility] C[facility] LAB;  Service: Cardiovascular;  Laterality: Bilateral;  . Total hip arthroplasty Right     Dr. [name]   Social History   Social History  . Marital Status: Married    Spouse Name: N/A  . Number of Children: N/A  . Years of Education: N/A   Social History Main Topics  . Smoking status: Never Smoker   .  Smokeless tobacco: Former Systems developer    Types: [place] date: 09/04/1991  . Alcohol Use: 0.5 oz/week    1 Standard drinks or equivalent per week     Comment: occassionaly  . Drug Use: No  . Sexual Activity: Not Currently   Other Topics Concern  . None   Social History Narrative   Lives in [place] with wife. Has 2 cats, dog died. Daughter in [place], 2 step-daughters. Retired from [employer], worked in Radio producer. Grew up in [place].    Review of Systems  Constitutional: Negative for fever, chills, activity change, appetite change, fatigue and unexpected weight change.  Eyes: Negative for visual disturbance.  Respiratory: Negative for cough and shortness of breath.   Cardiovascular: Negative for chest pain, palpitations and leg swelling.  Gastrointestinal: Positive for  constipation. Negative for nausea, vomiting, abdominal pain, diarrhea, blood in stool and abdominal distention.  Genitourinary: Negative for dysuria, urgency and difficulty urinating.  Musculoskeletal: Negative for arthralgias and gait problem.  Skin: Negative for color change and rash.  Hematological: Negative for adenopathy.  Psychiatric/Behavioral: Negative for sleep disturbance and dysphoric mood. The patient is not nervous/anxious.        Objective:    BP 139/75 mmHg  Pulse 72  Temp(Src) 97.7 F (36.5 C) (Oral)  Ht 6\' 1"  (1.854 m)  Wt 166 lb (75.297 kg)  BMI 21.91 kg/m2  SpO2 97% Physical Exam  Constitutional: He is oriented to person, place, and time. He appears well-developed and well-nourished. No distress.  HENT:  Head: Normocephalic and atraumatic.  Right Ear: External ear normal.  Left Ear: External ear normal.  Nose: Nose normal.  Mouth/Throat: Oropharynx is clear and moist. No oropharyngeal exudate.  Eyes: Conjunctivae and EOM are normal. Pupils are equal, round, and reactive to light. Right eye exhibits no discharge. Left eye exhibits no discharge. No scleral icterus.  Neck: Normal range of motion. Neck supple. No tracheal deviation present. No thyromegaly present.  Cardiovascular: Normal rate, regular rhythm and normal heart sounds.  Exam reveals no gallop and no friction rub.   No murmur heard. Pulmonary/Chest: Effort normal and breath sounds normal. No accessory muscle usage. No tachypnea. No respiratory distress. He has no decreased breath sounds. He has no wheezes. He has no rhonchi. He has no rales. He exhibits no tenderness.  Abdominal: Soft. Bowel sounds are normal. He exhibits no distension and no mass (slightly full esp right lower abdomen). There is no tenderness. There is no guarding.  Musculoskeletal: Normal range of motion. He exhibits no edema.  Lymphadenopathy:    He has no cervical adenopathy.  Neurological: He is alert and oriented to person, place,  and time. No cranial nerve deficit. Coordination normal.  Skin: Skin is warm and dry. No rash noted. He is not diaphoretic. No erythema. No pallor.  Psychiatric: He has a normal mood and affect. His behavior is normal. Judgment and thought content normal.          Assessment & Plan:   Problem List Items Addressed This Visit      Unprioritized   Constipation - Primary    Recent constipation. Exam is benign with normal bowel sounds. Will try Lactulose today. If no BM, then we discussed get plain xray or CT abdomen for further evaluation.      Relevant Medications   lactulose (CHRONULAC) 10 GM/15ML solution   Other Relevant Orders   Comprehensive metabolic panel   CBC with Differential/Platelet   Elevated blood sugar  Previously elevated BG. Will check A1c with labs.      Relevant Orders   Hemoglobin A1c   Prostate cancer ([place])    Continues to follow up with Dr. Bernardo Heater. Will check PSA with labs.      Relevant Orders   PSA, Medicare       Return in about 4 weeks (around 05/12/2015) for Recheck.  Ronette Deter, MD Internal Medicine [facility]

## 2015-04-14 NOTE — Assessment & Plan Note (Signed)
Continues to follow up with Dr. Bernardo Heater. Will check PSA with labs.

## 2015-04-15 ENCOUNTER — Ambulatory Visit
Admission: RE | Admit: 2015-04-15 | Discharge: 2015-04-15 | Disposition: A | Discharge status: Home / Self Care | Payer: PPO | Source: Ambulatory Visit | Attending: Internal Medicine | Admitting: Internal Medicine

## 2015-04-15 ENCOUNTER — Encounter: Payer: Self-pay | Admitting: *Deleted

## 2015-04-15 ENCOUNTER — Other Ambulatory Visit: Payer: Self-pay | Admitting: Internal Medicine

## 2015-04-15 DIAGNOSIS — R1031 Right lower quadrant pain: Secondary | ICD-10-CM

## 2015-04-15 DIAGNOSIS — K5900 Constipation, unspecified: Secondary | ICD-10-CM | POA: Diagnosis not present

## 2015-04-15 MED ORDER — IOPAMIDOL (ISOVUE-300) INJECTION 61%
100.0000 mL | Freq: Once | INTRAVENOUS | Status: AC | PRN
  Administered 2015-04-15: 100 mL via INTRAVENOUS

## 2015-04-17 ENCOUNTER — Ambulatory Visit (INDEPENDENT_AMBULATORY_CARE_PROVIDER_SITE_OTHER): Payer: PPO | Admitting: Internal Medicine

## 2015-04-17 VITALS — BP 144/75 | HR 72 | Temp 98.2°F | Wt 167.0 lb

## 2015-04-17 DIAGNOSIS — I714 Abdominal aortic aneurysm, without rupture, unspecified: Secondary | ICD-10-CM

## 2015-04-17 DIAGNOSIS — K5900 Constipation, unspecified: Secondary | ICD-10-CM

## 2015-04-17 NOTE — Patient Instructions (Addendum)
Continue current medications.  Follow up in May as scheduled.

## 2015-04-17 NOTE — Assessment & Plan Note (Signed)
Recent CT abdomen showed infrarenal bulge in aorta, approx 3.2 cm. Recommended follow up US in 3 years to assess stability.

## 2015-04-17 NOTE — Progress Notes (Signed)
Pre visit review using our clinic review tool, if applicable. No additional management support is needed unless otherwise documented below in the visit note. 

## 2015-04-17 NOTE — Assessment & Plan Note (Signed)
Symptoms improved with use of Miralax and prn Lactulose. Will continue. Reviewed CT, which showed no obstruction/ileus.

## 2015-04-17 NOTE — Progress Notes (Signed)
Subjective:    Patient ID: [name], male    DOB: 11-06-1932, 82 y.o.   MRN: [id number]  HPI  82YO male presents for follow up.  Recently seen for constipation. Started Lactulose. No improvement with this. Had CT abdomen which showed no acute obstruction or other cause of abdominal pain.  Feeling better. Had BM yesterday and Wednesday. No further abdominal pain. No blood in stool. Plans to start back taking Miralax.   Wt Readings from Last 3 Encounters:  04/17/15 167 lb (75.751 kg)  04/14/15 166 lb (75.297 kg)  02/25/15 170 lb 2 oz (77.168 kg)   BP Readings from Last 3 Encounters:  04/17/15 144/75  04/14/15 139/75  02/25/15 139/71    Past Medical History  Diagnosis Date  . Coronary artery disease     a. 1993 s/p PCI/BMS LAD;  b. 2003 PCI RCA;  c. 09/2006 ISR LAD->PCI, PCI RCA;  d. 02/2007 MV: no ischemia;  d. 01/2012 MV: EF 74%, no ischemia.  . Hyperlipidemia   . Hyperthyroidism   . PAF (paroxysmal atrial fibrillation) ([place])     a. previously on amio ->stopped in 2013;  b. recurrent PAF 01/2012->Pradaxa and amio initiated, subsequently came off of pradaxa 2/2 cost;  c. 02/2012 Echo: EF 55-60%, Gr 1 DD.  [name] Low testosterone   . History of prostate cancer   . Prostate cancer ([place])   . S/P CABG x 4    Family History  Problem Relation Age of Onset  . Coronary artery disease Mother   . Heart disease Mother   . Coronary artery disease Sister   . Diabetes Sister   . Heart disease Sister 55    deceased  . Diabetes Father   . Heart disease Brother    Past Surgical History  Procedure Laterality Date  . Cardiac catheterization  2008  . Prostatectomy    . Hernia repair      s/p mesh bilaterally, Dr. Bary Castilla  . Appendectomy      Dr. Bary Castilla  . Colonoscopy  2013  . Intraoperative transesophageal echocardiogram N/A 05/18/2012    Procedure: INTRAOPERATIVE TRANSESOPHAGEAL ECHOCARDIOGRAM;  Surgeon: [name], MD;  Location: [place];  Service: Open Heart Surgery;   Laterality: N/A;  . Maze N/A 05/18/2012    Procedure: MAZE;  Surgeon: [name], MD;  Location: [place];  Service: Open Heart Surgery;  Laterality: N/A;  . Clipping of atrial appendage Left 05/18/2012    Procedure: CLIPPING OF ATRIAL APPENDAGE;  Surgeon: [name], MD;  Location: [place];  Service: Open Heart Surgery;  Laterality: Left;  . Coronary artery bypass graft N/A 05/18/2012    Procedure: CORONARY ARTERY BYPASS GRAFTING (CABG);  Surgeon: [name], MD;  Location: [place];  Service: Open Heart Surgery;  Laterality: N/A;  Times 4 using left internal mammary artery and endoscopically harvested right saphenous vein  . Left heart catheterization with coronary angiogram Bilateral 05/16/2012    Procedure: LEFT HEART CATHETERIZATION WITH CORONARY ANGIOGRAM;  Surgeon: Burnell Blanks, MD;  Location: [facility] CATH LAB;  Service: Cardiovascular;  Laterality: Bilateral;  . Total hip arthroplasty Right     Dr. [name]   Social History   Social History  . Marital Status: Married    Spouse Name: N/A  . Number of Children: N/A  . Years of Education: N/A   Social History Main Topics  . Smoking status: Never Smoker   . Smokeless tobacco: Former Systems developer    Types: Loss adjuster, chartered  Quit date: 09/04/1991  . Alcohol Use: 0.5 oz/week    1 Standard drinks or equivalent per week     Comment: occassionaly  . Drug Use: No  . Sexual Activity: Not Currently   Other Topics Concern  . Not on file   Social History Narrative   Lives in [place] with wife. Has 2 cats, dog died. Daughter in [place], 2 step-daughters. Retired from [employer], worked in Radio producer. Grew up in [place].    Review of Systems  Constitutional: Negative for fever, chills, activity change, appetite change, fatigue and unexpected weight change.  Eyes: Negative for visual disturbance.  Respiratory: Negative for cough and shortness of breath.   Cardiovascular: Negative for chest pain, palpitations and leg swelling.  Gastrointestinal: Positive  for constipation. Negative for nausea, vomiting, abdominal pain, diarrhea, blood in stool and abdominal distention.  Genitourinary: Negative for dysuria, urgency and difficulty urinating.  Musculoskeletal: Negative for arthralgias and gait problem.  Skin: Negative for color change and rash.  Hematological: Negative for adenopathy.  Psychiatric/Behavioral: Negative for sleep disturbance and dysphoric mood. The patient is not nervous/anxious.        Objective:    BP 144/75 mmHg  Pulse 72  Temp(Src) 98.2 F (36.8 C) (Oral)  Wt 167 lb (75.751 kg)  SpO2 96% Physical Exam  Constitutional: He is oriented to person, place, and time. He appears well-developed and well-nourished. No distress.  HENT:  Head: Normocephalic and atraumatic.  Right Ear: External ear normal.  Left Ear: External ear normal.  Nose: Nose normal.  Mouth/Throat: Oropharynx is clear and moist. No oropharyngeal exudate.  Eyes: Conjunctivae and EOM are normal. Pupils are equal, round, and reactive to light. Right eye exhibits no discharge. Left eye exhibits no discharge. No scleral icterus.  Neck: Normal range of motion. Neck supple. No tracheal deviation present. No thyromegaly present.  Cardiovascular: Normal rate, regular rhythm and normal heart sounds.  Exam reveals no gallop and no friction rub.   No murmur heard. Pulmonary/Chest: Effort normal and breath sounds normal. No respiratory distress. He has no wheezes. He has no rales. He exhibits no tenderness.  Abdominal: Soft. Bowel sounds are normal. He exhibits no distension and no mass. There is no tenderness. There is no rebound and no guarding.  Musculoskeletal: Normal range of motion. He exhibits no edema.  Lymphadenopathy:    He has no cervical adenopathy.  Neurological: He is alert and oriented to person, place, and time. No cranial nerve deficit. Coordination normal.  Skin: Skin is warm and dry. No rash noted. He is not diaphoretic. No erythema. No pallor.    Psychiatric: He has a normal mood and affect. His behavior is normal. Judgment and thought content normal.          Assessment & Plan:   Problem List Items Addressed This Visit      Unprioritized   AAA (abdominal aortic aneurysm) without rupture ([place])    Recent CT abdomen showed infrarenal bulge in aorta, approx 3.2 cm. Recommended follow up US in 3 years to assess stability.      Constipation - Primary    Symptoms improved with use of Miralax and prn Lactulose. Will continue. Reviewed CT, which showed no obstruction/ileus.          Return if symptoms worsen or fail to improve.  Ronette Deter, MD Internal Medicine [facility]

## 2015-05-05 DIAGNOSIS — L812 Freckles: Secondary | ICD-10-CM | POA: Diagnosis not present

## 2015-05-05 DIAGNOSIS — L821 Other seborrheic keratosis: Secondary | ICD-10-CM | POA: Diagnosis not present

## 2015-05-05 DIAGNOSIS — L578 Other skin changes due to chronic exposure to nonionizing radiation: Secondary | ICD-10-CM | POA: Diagnosis not present

## 2015-05-05 DIAGNOSIS — L570 Actinic keratosis: Secondary | ICD-10-CM | POA: Diagnosis not present

## 2015-05-05 DIAGNOSIS — L219 Seborrheic dermatitis, unspecified: Secondary | ICD-10-CM | POA: Diagnosis not present

## 2015-05-05 DIAGNOSIS — D229 Melanocytic nevi, unspecified: Secondary | ICD-10-CM | POA: Diagnosis not present

## 2015-05-13 ENCOUNTER — Other Ambulatory Visit: Payer: Self-pay | Admitting: Internal Medicine

## 2015-05-14 ENCOUNTER — Other Ambulatory Visit: Payer: Self-pay | Admitting: Internal Medicine

## 2015-05-25 ENCOUNTER — Ambulatory Visit (INDEPENDENT_AMBULATORY_CARE_PROVIDER_SITE_OTHER): Payer: PPO | Admitting: Cardiovascular Disease

## 2015-05-25 ENCOUNTER — Encounter: Payer: Self-pay | Admitting: Cardiovascular Disease

## 2015-05-25 VITALS — BP 142/76 | HR 70 | Ht 73.0 in | Wt 170.0 lb

## 2015-05-25 DIAGNOSIS — E785 Hyperlipidemia, unspecified: Secondary | ICD-10-CM | POA: Diagnosis not present

## 2015-05-25 DIAGNOSIS — I2510 Atherosclerotic heart disease of native coronary artery without angina pectoris: Secondary | ICD-10-CM

## 2015-05-25 DIAGNOSIS — I10 Essential (primary) hypertension: Secondary | ICD-10-CM

## 2015-05-25 DIAGNOSIS — Z951 Presence of aortocoronary bypass graft: Secondary | ICD-10-CM

## 2015-05-25 DIAGNOSIS — I714 Abdominal aortic aneurysm, without rupture, unspecified: Secondary | ICD-10-CM

## 2015-05-25 DIAGNOSIS — I4891 Unspecified atrial fibrillation: Secondary | ICD-10-CM | POA: Diagnosis not present

## 2015-05-25 MED ORDER — SIMVASTATIN 40 MG PO TABS: 40.0000 mg | ORAL_TABLET | Freq: Every day | ORAL | Status: DC

## 2015-05-25 NOTE — Patient Instructions (Signed)
You are doing well. No medication changes were made.  Please call us if you have new issues that need to be addressed before your next appt.  Your physician wants you to follow-up in: 6 months.  You will receive a reminder letter in the mail two months in advance. If you don't receive a letter, please call our office to schedule the follow-up appointment.   

## 2015-05-25 NOTE — Assessment & Plan Note (Signed)
Cholesterol is at goal on the current lipid regimen. No changes to the medications were made.  

## 2015-05-25 NOTE — Assessment & Plan Note (Signed)
Denies any symptoms concerning for angina No further testing ordered

## 2015-05-25 NOTE — Assessment & Plan Note (Signed)
Blood pressure is well controlled on today's visit. No changes made to the medications. 

## 2015-05-25 NOTE — Assessment & Plan Note (Signed)
Currently with no symptoms of angina. No further workup at this time. Continue current medication regimen. 

## 2015-05-25 NOTE — Assessment & Plan Note (Signed)
3.2 cm AAA Will need periodic screening

## 2015-05-25 NOTE — Progress Notes (Signed)
Patient ID: [name], male    DOB: [birth year], 82 y.o.   MRN: [id number]  HPI Comments: [name] is a very pleasant 82 year old gentleman with a history of coronary artery disease, bypass surgery may 2014 . Also atrial fibrillation, previously on amiodarone He had a stent placed to his LAD in 1993, stent placed to his mid RCA in 2003, in-stent restenosis with repeat stent placed to his LAD in September 08, stent placed to his RCA at the same time, stress test in February 2009 and Jan 2014 which showed no ischemia,  history of low testosterone .  He presents for routine followup of his coronary artery disease  In follow-up today, he reports that he is doing well Having problems with constipation, continues to have back pain Feels his mattress is having a problem Works out at [employer] in the hospital 2 or 3 days per week Denies any symptoms of chest pain concerning for angina  EKG on today's visit shows normal sinus rhythm with rate 71 bpm, APCs, no significant ST or T-wave changes No recent lipid panel available   previous Cholesterol is above goal, LDL in the 90s. Total cholesterol less than 150  Other past medical history Previous episode of dizziness in 2015 dizziness. He was unable to get out of bed without assistance. Dizziness occurred while supine as well as sitting and standing. He went to the emergency room, was told that he could be dehydrated and was given IV fluids x2 bags. This seemed to improve his symptoms and was discharged home. Other workup in the hospital was negative including negative cardiac enzymes, normal basic metabolic panel, normal LFTs, normal EKG  Previously had chest pain in may 2014. Presented to [facility], cardiac catheterization showing subtotal occlusion of his LAD stent, in-stent restenosis, severe OM1 and OM 2 disease also with RCA disease,  On  05/18/2012.  He underwent CABG x 4 utilizing LIMA to LAD, SVG to OM1, SVG to OM2, and SVG to PDA.  He also  underwent Complete MAZE procedure with Clipping of Left Atrial Appendage.    He did not tolerate beta blockers or ACE inhibitor he had malaise, bradycardia. He took himself off these medications.   In  January 2014 he  had an episode of atrial fibrillation and presented to the emergency room. Converted back to normal sinus rhythm. Discharged on pradaxa. Recurrent episodes of atrial fibrillation shortly after that.   Allergies  Allergen Reactions  . Ambien [Zolpidem Tartrate] Other (See Comments)    Super sensitive.   . Statins     REACTION: muscle aches...crestor  . Zolpidem Tartrate     Outpatient Encounter Prescriptions as of 05/25/2015  Medication Sig  . Acetaminophen (TYLENOL PO) Take 2 tablets by mouth at bedtime as needed.  [name] aspirin EC 81 MG tablet Take 81 mg by mouth every evening.  . lactulose (CHRONULAC) 10 GM/15ML solution TAKE 30 MLS (20 G TOTAL) BY MOUTH 2 (TWO) TIMES DAILY AS NEEDED FOR MILD CONSTIPATION OR MODERATE CONSTIPATION.  [name] MAGNESIUM-POTASSIUM PO Take by mouth as needed. Reported on 04/14/2015  . polyethylene glycol powder (GLYCOLAX/MIRALAX) powder TAKE 17 G BY MOUTH AS DIRECTED.  [name] simvastatin (ZOCOR) 40 MG tablet Take 1 tablet (40 mg total) by mouth at bedtime.  [name] testosterone cypionate (DEPOTESTOTERONE CYPIONATE) 200 MG/ML injection Inject into the muscle every 14 (fourteen) days.  . [DISCONTINUED] simvastatin (ZOCOR) 40 MG tablet Take 1 tablet (40 mg total) by mouth at bedtime.   No  facility-administered encounter medications on file as of 05/25/2015.    Past Medical History  Diagnosis Date  . Coronary artery disease     a. 1993 s/p PCI/BMS LAD;  b. 2003 PCI RCA;  c. 09/2006 ISR LAD->PCI, PCI RCA;  d. 02/2007 MV: no ischemia;  d. 01/2012 MV: EF 74%, no ischemia.  . Hyperlipidemia   . Hyperthyroidism   . PAF (paroxysmal atrial fibrillation) ([place])     a. previously on amio ->stopped in 2013;  b. recurrent PAF 01/2012->Pradaxa and amio initiated, subsequently  came off of pradaxa 2/2 cost;  c. 02/2012 Echo: EF 55-60%, Gr 1 DD.  [name] Low testosterone   . History of prostate cancer   . Prostate cancer ([place])   . S/P CABG x 4     Past Surgical History  Procedure Laterality Date  . Cardiac catheterization  2008  . Prostatectomy    . Hernia repair      s/p mesh bilaterally, Dr. Bary Castilla  . Appendectomy      Dr. Bary Castilla  . Colonoscopy  2013  . Intraoperative transesophageal echocardiogram N/A 05/18/2012    Procedure: INTRAOPERATIVE TRANSESOPHAGEAL ECHOCARDIOGRAM;  Surgeon: [name], MD;  Location: [place];  Service: Open Heart Surgery;  Laterality: N/A;  . Maze N/A 05/18/2012    Procedure: MAZE;  Surgeon: [name], MD;  Location: [place];  Service: Open Heart Surgery;  Laterality: N/A;  . Clipping of atrial appendage Left 05/18/2012    Procedure: CLIPPING OF ATRIAL APPENDAGE;  Surgeon: [name], MD;  Location: [place];  Service: Open Heart Surgery;  Laterality: Left;  . Coronary artery bypass graft N/A 05/18/2012    Procedure: CORONARY ARTERY BYPASS GRAFTING (CABG);  Surgeon: [name], MD;  Location: [place];  Service: Open Heart Surgery;  Laterality: N/A;  Times 4 using left internal mammary artery and endoscopically harvested right saphenous vein  . Left heart catheterization with coronary angiogram Bilateral 05/16/2012    Procedure: LEFT HEART CATHETERIZATION WITH CORONARY ANGIOGRAM;  Surgeon: Burnell Blanks, MD;  Location: [facility] CATH LAB;  Service: Cardiovascular;  Laterality: Bilateral;  . Total hip arthroplasty Right     Dr. [name]    Social History  reports that he has never smoked. He quit smokeless tobacco use about 23 years ago. His smokeless tobacco use included Chew. He reports that he drinks about 0.5 oz of alcohol per week. He reports that he does not use illicit drugs.  Family History family history includes Coronary artery disease in his mother and sister; Diabetes in his father and sister; Heart disease in his brother  and mother; Heart disease (age of onset: 39) in his sister.   Review of Systems  Constitutional: Negative.   Respiratory: Negative.   Cardiovascular: Negative.   Gastrointestinal: Negative.   Musculoskeletal: Positive for back pain.  Skin: Negative.   Neurological: Negative.   Hematological: Negative.   Psychiatric/Behavioral: Negative.   All other systems reviewed and are negative.  BP 142/76 mmHg  Pulse 70  Ht 6\' 1"  (1.854 m)  Wt 170 lb (77.111 kg)  BMI 22.43 kg/m2  Physical Exam  Constitutional: He is oriented to person, place, and time. He appears well-developed and well-nourished.  HENT:  Head: Normocephalic.  Nose: Nose normal.  Mouth/Throat: Oropharynx is clear and moist.  Eyes: Conjunctivae are normal. Pupils are equal, round, and reactive to light.  Neck: Normal range of motion. Neck supple. No JVD present.  Cardiovascular: Normal rate, regular rhythm, S1 normal,  S2 normal, normal heart sounds and intact distal pulses.  Exam reveals no gallop and no friction rub.   No murmur heard. Well healed mediastinal scar  Pulmonary/Chest: Effort normal and breath sounds normal. No respiratory distress. He has no wheezes. He has no rales. He exhibits no tenderness.  Abdominal: Soft. Bowel sounds are normal. He exhibits no distension. There is no tenderness.  Musculoskeletal: Normal range of motion. He exhibits no edema or tenderness.  Lymphadenopathy:    He has no cervical adenopathy.  Neurological: He is alert and oriented to person, place, and time. Coordination normal.  Skin: Skin is warm and dry. No rash noted. No erythema.  Psychiatric: He has a normal mood and affect. His behavior is normal. Judgment and thought content normal.      Assessment and Plan   Nursing note and vitals reviewed.

## 2015-05-25 NOTE — Assessment & Plan Note (Signed)
Maintaining normal sinus rhythm. No changes to his medications. 

## 2015-05-28 ENCOUNTER — Ambulatory Visit (INDEPENDENT_AMBULATORY_CARE_PROVIDER_SITE_OTHER): Payer: PPO | Admitting: Internal Medicine

## 2015-05-28 ENCOUNTER — Encounter: Payer: Self-pay | Admitting: Internal Medicine

## 2015-05-28 ENCOUNTER — Telehealth: Payer: Self-pay | Admitting: *Deleted

## 2015-05-28 VITALS — BP 144/80 | HR 69 | Ht 73.0 in | Wt 168.4 lb

## 2015-05-28 DIAGNOSIS — S51811A Laceration without foreign body of right forearm, initial encounter: Secondary | ICD-10-CM | POA: Diagnosis not present

## 2015-05-28 DIAGNOSIS — W5503XA Scratched by cat, initial encounter: Secondary | ICD-10-CM | POA: Diagnosis not present

## 2015-05-28 DIAGNOSIS — K5900 Constipation, unspecified: Secondary | ICD-10-CM

## 2015-05-28 DIAGNOSIS — E785 Hyperlipidemia, unspecified: Secondary | ICD-10-CM | POA: Diagnosis not present

## 2015-05-28 LAB — LIPID PANEL
CHOLESTEROL: 128 mg/dL (ref 0–200)
HDL: 31.6 mg/dL — AB (ref >39.00)
LDL Cholesterol: 79 mg/dL (ref 0–99)
NonHDL: 96.25
TRIGLYCERIDES: 85 mg/dL (ref 0.0–149.0)
Total CHOL/HDL Ratio: 4
VLDL: 17 mg/dL (ref 0.0–40.0)

## 2015-05-28 MED ORDER — GENTAMICIN SULFATE 0.1 % EX OINT: 1.0000 "application " | TOPICAL_OINTMENT | Freq: Three times a day (TID) | CUTANEOUS | Status: DC

## 2015-05-28 NOTE — Telephone Encounter (Signed)
Spoke with the patient, reviewed results and printed a copy for him to take to another appt tomorrow. thanks

## 2015-05-28 NOTE — Assessment & Plan Note (Signed)
Will recheck lipids with labs today. Continue Simvastatin.

## 2015-05-28 NOTE — Patient Instructions (Addendum)
Labs today to check cholesterol.  Continue current medications.  Start Gentamicin to right arm wound 2-3 times daily.

## 2015-05-28 NOTE — Progress Notes (Signed)
Pre visit review using our clinic review tool, if applicable. No additional management support is needed unless otherwise documented below in the visit note. 

## 2015-05-28 NOTE — Telephone Encounter (Signed)
Patient requested to have his lab results as soon as the result, he has another appt in the morning at 10am, and wanted to take the results with him if they are available.

## 2015-05-28 NOTE — Progress Notes (Signed)
Subjective:    Patient ID: [name], male    DOB: 1932-04-22, 82 y.o.   MRN: [id number]  HPI  82YO male presents for follow up.  Last BM on Tuesday. Stool has not been hard. Taking Miralax most days. Drinking increased fluids. Using Lactulose only on occasion.  Seen by Dr. Rockey Situ this week.   Cat scratched his arm this week. No fever, chills. Mild redness at site.  Wt Readings from Last 3 Encounters:  05/28/15 168 lb 6.4 oz (76.386 kg)  05/25/15 170 lb (77.111 kg)  04/17/15 167 lb (75.751 kg)   BP Readings from Last 3 Encounters:  05/28/15 144/80  05/25/15 142/76  04/17/15 144/75    Past Medical History  Diagnosis Date  . Coronary artery disease     a. 1993 s/p PCI/BMS LAD;  b. 2003 PCI RCA;  c. 09/2006 ISR LAD->PCI, PCI RCA;  d. 02/2007 MV: no ischemia;  d. 01/2012 MV: EF 74%, no ischemia.  . Hyperlipidemia   . Hyperthyroidism   . PAF (paroxysmal atrial fibrillation) ([place])     a. previously on amio ->stopped in 2013;  b. recurrent PAF 01/2012->Pradaxa and amio initiated, subsequently came off of pradaxa 2/2 cost;  c. 02/2012 Echo: EF 55-60%, Gr 1 DD.  [name] Low testosterone   . History of prostate cancer   . Prostate cancer ([place])   . S/P CABG x 4    Family History  Problem Relation Age of Onset  . Coronary artery disease Mother   . Heart disease Mother   . Coronary artery disease Sister   . Diabetes Sister   . Heart disease Sister 90    deceased  . Diabetes Father   . Heart disease Brother    Past Surgical History  Procedure Laterality Date  . Cardiac catheterization  2008  . Prostatectomy    . Hernia repair      s/p mesh bilaterally, Dr. Bary Castilla  . Appendectomy      Dr. Bary Castilla  . Colonoscopy  2013  . Intraoperative transesophageal echocardiogram N/A 05/18/2012    Procedure: INTRAOPERATIVE TRANSESOPHAGEAL ECHOCARDIOGRAM;  Surgeon: [name], MD;  Location: [place];  Service: Open Heart Surgery;  Laterality: N/A;  . Maze N/A 05/18/2012    Procedure:  MAZE;  Surgeon: [name], MD;  Location: [place];  Service: Open Heart Surgery;  Laterality: N/A;  . Clipping of atrial appendage Left 05/18/2012    Procedure: CLIPPING OF ATRIAL APPENDAGE;  Surgeon: [name], MD;  Location: [place];  Service: Open Heart Surgery;  Laterality: Left;  . Coronary artery bypass graft N/A 05/18/2012    Procedure: CORONARY ARTERY BYPASS GRAFTING (CABG);  Surgeon: [name], MD;  Location: [place];  Service: Open Heart Surgery;  Laterality: N/A;  Times 4 using left internal mammary artery and endoscopically harvested right saphenous vein  . Left heart catheterization with coronary angiogram Bilateral 05/16/2012    Procedure: LEFT HEART CATHETERIZATION WITH CORONARY ANGIOGRAM;  Surgeon: Burnell Blanks, MD;  Location: [facility] CATH LAB;  Service: Cardiovascular;  Laterality: Bilateral;  . Total hip arthroplasty Right     Dr. [name]   Social History   Social History  . Marital Status: Married    Spouse Name: N/A  . Number of Children: N/A  . Years of Education: N/A   Social History Main Topics  . Smoking status: Never Smoker   . Smokeless tobacco: Former Systems developer    Types: Risk analyst  date: 09/04/1991  . Alcohol Use: 0.5 oz/week    1 Standard drinks or equivalent per week     Comment: occassionaly  . Drug Use: No  . Sexual Activity: Not Currently   Other Topics Concern  . None   Social History Narrative   Lives in [place] with wife. Has 2 cats, dog died. Daughter in [place], 2 step-daughters. Retired from [employer], worked in Radio producer. Grew up in [place].    Review of Systems  Constitutional: Negative for fever, chills, activity change, appetite change, fatigue and unexpected weight change.  Eyes: Negative for visual disturbance.  Respiratory: Negative for cough and shortness of breath.   Cardiovascular: Negative for chest pain, palpitations and leg swelling.  Gastrointestinal: Positive for constipation. Negative for nausea, vomiting, abdominal  pain, diarrhea and abdominal distention.  Genitourinary: Negative for dysuria, urgency and difficulty urinating.  Musculoskeletal: Negative for myalgias, arthralgias and gait problem.  Skin: Positive for color change. Negative for rash.  Hematological: Negative for adenopathy.  Psychiatric/Behavioral: Negative for suicidal ideas, sleep disturbance and dysphoric mood. The patient is not nervous/anxious.        Objective:    BP 144/80 mmHg  Pulse 69  Ht 6\' 1"  (1.854 m)  Wt 168 lb 6.4 oz (76.386 kg)  BMI 22.22 kg/m2  SpO2 98% Physical Exam  Constitutional: He is oriented to person, place, and time. He appears well-developed and well-nourished. No distress.  HENT:  Head: Normocephalic and atraumatic.  Right Ear: External ear normal.  Left Ear: External ear normal.  Nose: Nose normal.  Mouth/Throat: Oropharynx is clear and moist. No oropharyngeal exudate.  Eyes: Conjunctivae and EOM are normal. Pupils are equal, round, and reactive to light. Right eye exhibits no discharge. Left eye exhibits no discharge. No scleral icterus.  Neck: Normal range of motion. Neck supple. No tracheal deviation present. No thyromegaly present.  Cardiovascular: Normal rate, regular rhythm and normal heart sounds.  Exam reveals no gallop and no friction rub.   No murmur heard. Pulmonary/Chest: Effort normal and breath sounds normal. No accessory muscle usage. No tachypnea. No respiratory distress. He has no decreased breath sounds. He has no wheezes. He has no rhonchi. He has no rales. He exhibits no tenderness.  Musculoskeletal: Normal range of motion. He exhibits no edema.  Lymphadenopathy:    He has no cervical adenopathy.  Neurological: He is alert and oriented to person, place, and time. No cranial nerve deficit. Coordination normal.  Skin: Skin is warm and dry. No rash noted. He is not diaphoretic. No erythema. No pallor.     Psychiatric: He has a normal mood and affect. His behavior is normal.  Judgment and thought content normal.          Assessment & Plan:   Problem List Items Addressed This Visit      Unprioritized   Cat scratch    Recent scratch on arm by one of his cats. Area appears to be healing well. Discussed adding an oral antibiotic. He prefers to hold off. Will start topical gentamicin and he will follow up if any worsening redness, pain, swelling, fever, or chills.      Relevant Medications   gentamicin ointment (GARAMYCIN) 0.1 %   Constipation - Primary    Symptoms improved. Will continue Miralax and prn Lactulose. Encouraged him to add fiber supplement. Continue increased fluids. Follow up prn.      Hyperlipidemia    Will recheck lipids with labs today. Continue Simvastatin.      Relevant  Orders   Lipid Profile       Return in about 6 months (around 11/28/2015) for Physical.  Ronette Deter, MD [facility]

## 2015-05-28 NOTE — Assessment & Plan Note (Signed)
Recent scratch on arm by one of his cats. Area appears to be healing well. Discussed adding an oral antibiotic. He prefers to hold off. Will start topical gentamicin and he will follow up if any worsening redness, pain, swelling, fever, or chills.

## 2015-05-28 NOTE — Assessment & Plan Note (Signed)
Symptoms improved. Will continue Miralax and prn Lactulose. Encouraged him to add fiber supplement. Continue increased fluids. Follow up prn.

## 2015-05-29 DIAGNOSIS — Z8546 Personal history of malignant neoplasm of prostate: Secondary | ICD-10-CM | POA: Diagnosis not present

## 2015-05-29 DIAGNOSIS — E291 Testicular hypofunction: Secondary | ICD-10-CM | POA: Diagnosis not present

## 2015-05-29 DIAGNOSIS — Z79899 Other long term (current) drug therapy: Secondary | ICD-10-CM | POA: Diagnosis not present

## 2015-06-04 DIAGNOSIS — H25813 Combined forms of age-related cataract, bilateral: Secondary | ICD-10-CM | POA: Diagnosis not present

## 2015-09-23 ENCOUNTER — Ambulatory Visit (INDEPENDENT_AMBULATORY_CARE_PROVIDER_SITE_OTHER): Payer: PPO | Admitting: Family Medicine

## 2015-09-23 ENCOUNTER — Encounter: Payer: Self-pay | Admitting: Family Medicine

## 2015-09-23 VITALS — BP 148/83 | HR 70 | Temp 97.8°F | Wt 167.2 lb

## 2015-09-23 DIAGNOSIS — M549 Dorsalgia, unspecified: Secondary | ICD-10-CM

## 2015-09-23 DIAGNOSIS — Z23 Encounter for immunization: Secondary | ICD-10-CM | POA: Diagnosis not present

## 2015-09-23 DIAGNOSIS — G8929 Other chronic pain: Secondary | ICD-10-CM

## 2015-09-23 MED ORDER — TRAMADOL HCL 50 MG PO TABS: 50.0000 mg | ORAL_TABLET | Freq: Three times a day (TID) | ORAL | 0 refills | Status: DC | PRN

## 2015-09-23 NOTE — Progress Notes (Signed)
Subjective:  Patient ID: ARLOW [name], male    DOB: 1932-01-14  Age: 82 y.o. MRN: [id number]  CC: Follow up, Back pain  HPI:  82 year old male with a comp and get a past medical history including hypertension, hyperlipidemia, CAD status post CABG and PCI, Afib s/p Maze procedure presents for follow up. He complains of back pain today.  Back pain  Patient has long-standing chronic back pain.  Pain is located in the lower thoracic spine. Moderate in severity. Severe at times.  Patient has had an x-ray which revealed osteoarthritis.  He has been taking Tylenol and PRN NSAID's with little improvement.  No known exacerbating factors.  No radiation.  No other associated symptoms.  No other complaints at this time.  Social Hx   Social History   Social History  . Marital status: Married    Spouse name: N/A  . Number of children: N/A  . Years of education: N/A   Social History Main Topics  . Smoking status: Never Smoker  . Smokeless tobacco: Former Systems developer    Types: Story date: 09/04/1991  . Alcohol use 0.5 oz/week    1 Standard drinks or equivalent per week     Comment: occassionaly  . Drug use: No  . Sexual activity: Not Currently   Other Topics Concern  . None   Social History Narrative   Lives in [place] with wife. Has 2 cats, dog died. Daughter in [place], 2 step-daughters. Retired from [employer], worked in Radio producer. Grew up in [place].   Review of Systems  Constitutional: Negative.   Musculoskeletal: Positive for back pain.   Objective:  BP (!) 148/83 (BP Location: Left Arm, Patient Position: Sitting, Cuff Size: Normal)   Pulse 70   Temp 97.8 F (36.6 C) (Oral)   Wt 167 lb 4 oz (75.9 kg)   SpO2 98%   BMI 22.07 kg/m   BP/Weight 09/23/2015 05/28/2015 [id number]  Systolic BP 123456 123456 [id number]  Diastolic BP 83 80 76  Wt. (Lbs) 167.25 168.4 170  BMI 22.07 22.22 22.43   Physical Exam  Constitutional: He is oriented to person, place, and time. He appears  well-developed. No distress.  Pulmonary/Chest: Effort normal.  Musculoskeletal:  Thoracic spine - Kyphosis noted. No tenderness to palpation.  Neurological: He is alert and oriented to person, place, and time.  Psychiatric: He has a normal mood and affect.  Vitals reviewed.   Lab Results  Component Value Date   WBC 6.7 04/14/2015   HGB 14.6 04/14/2015   HCT 43.7 04/14/2015   PLT 218.0 04/14/2015   GLUCOSE 120 (H) 04/14/2015   CHOL 128 05/28/2015   TRIG 85.0 05/28/2015   HDL 31.60 (L) 05/28/2015   LDLCALC 79 05/28/2015   ALT 12 04/14/2015   AST 17 04/14/2015   NA 139 04/14/2015   K 4.3 04/14/2015   CL 102 04/14/2015   CREATININE 1.03 04/14/2015   BUN 20 04/14/2015   CO2 29 04/14/2015   TSH 1.97 09/23/2014   PSA 0.00 (L) 04/14/2015   INR 2.97 06/08/2012   HGBA1C 5.9 04/14/2015   MICROALBUR 1.7 08/12/2013    Assessment & Plan:   Problem List Items Addressed This Visit    Chronic back pain    Established problem, worsening. Xray from 09/2014 visualized and reviewed independently today. Interpretation: Thoracic kyphosis noted. Spondylosis/osteoarthritic changes noted diffusely. Postsurgical changes noted (from CABG and MAZE). Recent laboratory studies reviewed. PSA nonexistent. Given past medical history, advised not to use  NSAIDs. Treating with tramadol. Rx given today.      Relevant Medications   traMADol (ULTRAM) 50 MG tablet    Other Visit Diagnoses    Encounter for immunization       Relevant Orders   Flu vaccine HIGH DOSE PF (Completed)      Meds ordered this encounter  Medications  . traMADol (ULTRAM) 50 MG tablet    Sig: Take 1 tablet (50 mg total) by mouth every 8 (eight) hours as needed.    Dispense:  30 tablet    Refill:  0    Follow-up: Return in about 3 months (around 12/23/2015) for Follow up Chronic medical issues.  [place]

## 2015-09-23 NOTE — Progress Notes (Signed)
Pre visit review using our clinic review tool, if applicable. No additional management support is needed unless otherwise documented below in the visit note. 

## 2015-09-23 NOTE — Assessment & Plan Note (Signed)
Established problem, worsening. Xray from 09/2014 visualized and reviewed independently today. Interpretation: Thoracic kyphosis noted. Spondylosis/osteoarthritic changes noted diffusely. Postsurgical changes noted (from CABG and MAZE). Recent laboratory studies reviewed. PSA nonexistent. Given past medical history, advised not to use NSAIDs. Treating with tramadol. Rx given today.

## 2015-09-23 NOTE — Patient Instructions (Signed)
Try the tramadol.  Follow up in 3 months.  Take care  Dr. Lacinda Axon

## 2015-11-03 DIAGNOSIS — L820 Inflamed seborrheic keratosis: Secondary | ICD-10-CM | POA: Diagnosis not present

## 2015-11-03 DIAGNOSIS — L219 Seborrheic dermatitis, unspecified: Secondary | ICD-10-CM | POA: Diagnosis not present

## 2015-11-23 ENCOUNTER — Encounter: Payer: Self-pay | Admitting: Cardiovascular Disease

## 2015-11-23 ENCOUNTER — Ambulatory Visit (INDEPENDENT_AMBULATORY_CARE_PROVIDER_SITE_OTHER): Payer: PPO | Admitting: Cardiovascular Disease

## 2015-11-23 VITALS — BP 140/90 | HR 73 | Ht 73.0 in | Wt 168.8 lb

## 2015-11-23 DIAGNOSIS — I10 Essential (primary) hypertension: Secondary | ICD-10-CM

## 2015-11-23 DIAGNOSIS — I714 Abdominal aortic aneurysm, without rupture, unspecified: Secondary | ICD-10-CM

## 2015-11-23 DIAGNOSIS — M545 Low back pain: Secondary | ICD-10-CM

## 2015-11-23 DIAGNOSIS — I700 Atherosclerosis of aorta: Secondary | ICD-10-CM

## 2015-11-23 DIAGNOSIS — G8929 Other chronic pain: Secondary | ICD-10-CM

## 2015-11-23 DIAGNOSIS — Z951 Presence of aortocoronary bypass graft: Secondary | ICD-10-CM

## 2015-11-23 DIAGNOSIS — I4891 Unspecified atrial fibrillation: Secondary | ICD-10-CM

## 2015-11-23 DIAGNOSIS — E7800 Pure hypercholesterolemia, unspecified: Secondary | ICD-10-CM | POA: Diagnosis not present

## 2015-11-23 DIAGNOSIS — I2510 Atherosclerotic heart disease of native coronary artery without angina pectoris: Secondary | ICD-10-CM

## 2015-11-23 NOTE — Progress Notes (Signed)
Cardiology Office Note  Date:  11/23/2015   ID:  [name], DOB 1932-10-19, MRN [id number]  PCP:  Darrell Spikes, DO   Chief Complaint  Patient presents with  . other    6 month f/u c/o constipation. Meds reviewed verbally with pt.    HPI:  [name] is a very pleasant 82 year old gentleman with a history of coronary artery disease, bypass surgery may 2014 . Also atrial fibrillation, previously on amiodarone He had a stent placed to his LAD in 1993, stent placed to his mid RCA in 2003, in-stent restenosis with repeat stent placed to his LAD in September 08, stent placed to his RCA at the same time, stress test in February 2009 and Jan 2014 which showed no ischemia,  history of low testosterone  He presents for routine followup of his coronary artery disease   in follow-up he reports his major issue is with severe constipation Taking miralex But still having trouble Using suppositories  continues to have back pain No regular exercise program Was going to fitness gym in the hospital 2 or 3 days per week, stopped Denies any symptoms of chest pain concerning for angina  Lab work reviewed with him Total chol 128,LDL 79  EKG on today's visit shows normal sinus rhythm with rate 73 bpm, no significant ST or T-wave changes  Other past medical history Previous episode of dizziness in 2015 dizziness. He was unable to get out of bed without assistance. Dizziness occurred while supine as well as sitting and standing. He went to the emergency room, was told that he could be dehydrated and was given IV fluids x2 bags. This seemed to improve his symptoms and was discharged home. Other workup in the hospital was negative including negative cardiac enzymes, normal basic metabolic panel, normal LFTs, normal EKG  Previously had chest pain in may 2014. Presented to [facility], cardiac catheterization showing subtotal occlusion of his LAD stent, in-stent restenosis, severe OM1 and OM 2 disease also with  RCA disease,  On  05/18/2012.  He underwent CABG x 4 utilizing LIMA to LAD, SVG to OM1, SVG to OM2, and SVG to PDA.  He also underwent Complete MAZE procedure with Clipping of Left Atrial Appendage.    He did not tolerate beta blockers or ACE inhibitor he had malaise, bradycardia. He took himself off these medications.   In  January 2014 he  had an episode of atrial fibrillation and presented to the emergency room. Converted back to normal sinus rhythm. Discharged on pradaxa. Recurrent episodes of atrial fibrillation shortly after that.   PMH:   has a past medical history of Coronary artery disease; History of prostate cancer; Hyperlipidemia; Hyperthyroidism; Low testosterone; PAF (paroxysmal atrial fibrillation) ([name]); Prostate cancer ([name]); and S/P CABG x 4.  PSH:    Past Surgical History:  Procedure Laterality Date  . APPENDECTOMY     Dr. Bary Castilla  . CARDIAC CATHETERIZATION  2008  . CLIPPING OF ATRIAL APPENDAGE Left 05/18/2012   Procedure: CLIPPING OF ATRIAL APPENDAGE;  Surgeon: [name], MD;  Location: [place];  Service: Open Heart Surgery;  Laterality: Left;  . COLONOSCOPY  2013  . CORONARY ARTERY BYPASS GRAFT N/A 05/18/2012   Procedure: CORONARY ARTERY BYPASS GRAFTING (CABG);  Surgeon: [name], MD;  Location: [place];  Service: Open Heart Surgery;  Laterality: N/A;  Times 4 using left internal mammary artery and endoscopically harvested right saphenous vein  . HERNIA REPAIR     s/p mesh bilaterally,  Dr. Bary Castilla  . INTRAOPERATIVE TRANSESOPHAGEAL ECHOCARDIOGRAM N/A 05/18/2012   Procedure: INTRAOPERATIVE TRANSESOPHAGEAL ECHOCARDIOGRAM;  Surgeon: [name], MD;  Location: [place];  Service: Open Heart Surgery;  Laterality: N/A;  . LEFT HEART CATHETERIZATION WITH CORONARY ANGIOGRAM Bilateral 05/16/2012   Procedure: LEFT HEART CATHETERIZATION WITH CORONARY ANGIOGRAM;  Surgeon: Burnell Blanks, MD;  Location: [facility] CATH LAB;  Service: Cardiovascular;  Laterality: Bilateral;  .  MAZE N/A 05/18/2012   Procedure: MAZE;  Surgeon: [name], MD;  Location: [place];  Service: Open Heart Surgery;  Laterality: N/A;  . MOHS SURGERY    . PROSTATECTOMY    . TOTAL HIP ARTHROPLASTY Right    Dr. [name]    Current Outpatient Prescriptions  Medication Sig Dispense Refill  . Acetaminophen (TYLENOL ARTHRITIS PAIN PO) Take by mouth as needed.    [name] aspirin EC 81 MG tablet Take 81 mg by mouth every evening.    . hydrocortisone ointment 0.5 % Apply 1 application topically 2 (two) times daily.    [name] MAGNESIUM-POTASSIUM PO Take by mouth as needed. Reported on 04/14/2015    . phenylephrine (NEO-SYNEPHRINE) 1 % nasal spray Place 1 drop into both nostrils every morning.    . polyethylene glycol powder (GLYCOLAX/MIRALAX) powder TAKE 17 G BY MOUTH AS DIRECTED. 527 g 5  . simvastatin (ZOCOR) 40 MG tablet Take 1 tablet (40 mg total) by mouth at bedtime. 90 tablet 3  . testosterone cypionate (DEPOTESTOTERONE CYPIONATE) 200 MG/ML injection Inject into the muscle every 14 (fourteen) days.    . Tetrahydrozoline HCl (EYE DROPS OP) Apply to eye every morning.    . traMADol (ULTRAM) 50 MG tablet Take 1 tablet (50 mg total) by mouth every 8 (eight) hours as needed. 30 tablet 0   No current facility-administered medications for this visit.      Allergies:   Ambien [zolpidem tartrate]; Statins; and Zolpidem tartrate   Social History:  The patient  reports that he has never smoked. He quit smokeless tobacco use about 24 years ago. His smokeless tobacco use included Chew. He reports that he does not drink alcohol or use drugs.   Family History:   family history includes Coronary artery disease in his mother and sister; Diabetes in his father and sister; Heart disease in his brother and mother; Heart disease (age of onset: 75) in his sister.    Review of Systems: Review of Systems  Constitutional: Negative.   Respiratory: Negative.   Cardiovascular: Negative.   Gastrointestinal: Negative.    Musculoskeletal: Positive for back pain.  Neurological: Negative.   Psychiatric/Behavioral: Negative.   All other systems reviewed and are negative.    PHYSICAL EXAM: VS:  BP 140/90 (BP Location: Left Arm, Patient Position: Sitting, Cuff Size: Normal)   Pulse 73   Ht 6\' 1"  (1.854 m)   Wt 168 lb 12 oz (76.5 kg)   BMI 22.26 kg/m  , BMI Body mass index is 22.26 kg/m. GEN: Well nourished, well developed, in no acute distress  HEENT: normal  Neck: no JVD, carotid bruits, or masses Cardiac: RRR; no murmurs, rubs, or gallops,no edema  Respiratory:  clear to auscultation bilaterally, normal work of breathing GI: soft, nontender, nondistended, + BS MS: no deformity or atrophy  Skin: warm and dry, no rash Neuro:  Strength and sensation are intact Psych: euthymic mood, full affect    Recent Labs: 04/14/2015: ALT 12; BUN 20; Creatinine, Ser 1.03; Hemoglobin 14.6; Platelets 218.0; Potassium 4.3; Sodium 139    Lipid  Panel Lab Results  Component Value Date   CHOL 128 05/28/2015   HDL 31.60 (L) 05/28/2015   LDLCALC 79 05/28/2015   TRIG 85.0 05/28/2015      Wt Readings from Last 3 Encounters:  11/23/15 168 lb 12 oz (76.5 kg)  09/23/15 167 lb 4 oz (75.9 kg)  05/28/15 168 lb 6.4 oz (76.4 kg)       ASSESSMENT AND PLAN:  Atrial fibrillation, unspecified type ([place]) - Plan: EKG 12-Lead Maintaining normal sinus rhythm Last episode in 2014, no longer on anticoagulation  Atherosclerosis of native coronary artery of native heart without angina pectoris - Plan: EKG 12-Lead Currently with no symptoms of angina. No further workup at this time. Continue current medication regimen.  Pure hypercholesterolemia Cholesterol is at goal on the current lipid regimen. No changes to the medications were made.  HYPERTENSION, BENIGN Blood pressure is well controlled on today's visit. No changes made to the medications.  Aortic atherosclerosis (HCC) Stressed importance of aggressive lipid  control  AAA (abdominal aortic aneurysm) without rupture (HCC) 3.2 cm by CT scan April 2017  S/P CABG x 4 Currently with no symptoms concerning for angina  Chronic low back pain, unspecified back pain laterality, with sciatica presence unspecified Recommended back exercises, regular exercise program   Total encounter time more than 25 minutes  Greater than 50% was spent in counseling and coordination of care with the patient   Disposition:   F/U  6 months   Orders Placed This Encounter  Procedures  . EKG 12-Lead     Signed, Esmond Plants, M.D., Ph.D. 11/23/2015  [place], [place]

## 2015-11-23 NOTE — Patient Instructions (Addendum)
Medication Instructions:   Try magnesium citrate (in a bottle) for constipation  Labwork:  No new labs needed  Testing/Procedures:  No further testing at this time   I recommend watching educational videos on topics of interest to you at:       www.goemmi.com  Enter code: HEARTCARE    Follow-Up: It was a pleasure seeing you in the office today. Please call us if you have new issues that need to be addressed before your next appt.  [phone]  Your physician wants you to follow-up in: 6 months.  You will receive a reminder letter in the mail two months in advance. If you don't receive a letter, please call our office to schedule the follow-up appointment.  If you need a refill on your cardiac medications before your next appointment, please call your pharmacy.

## 2015-11-24 ENCOUNTER — Ambulatory Visit (INDEPENDENT_AMBULATORY_CARE_PROVIDER_SITE_OTHER): Payer: PPO

## 2015-11-24 VITALS — BP 144/82 | HR 68 | Temp 97.7°F | Resp 14 | Ht 73.0 in | Wt 170.4 lb

## 2015-11-24 DIAGNOSIS — Z0000 Encounter for general adult medical examination without abnormal findings: Secondary | ICD-10-CM

## 2015-11-24 NOTE — Patient Instructions (Addendum)
  [name] , Thank you for taking time to come for your Medicare Wellness Visit. I appreciate your ongoing commitment to your health goals. Please review the following plan we discussed and let me know if I can assist you in the future.   FOLLOW UP WITH DR. COOK AS DIRECTED.  These are the goals we discussed: Goals    . Increase water intake          STAY HYDRATED AND DRINK PLENTY OF WATER.       This is a list of the screening recommended for you and due dates:  Health Maintenance  Topic Date Due  . Shingles Vaccine  11/22/2016*  . Tetanus Vaccine  11/22/2016*  . Flu Shot  Completed  . Pneumonia vaccines  Completed  *Topic was postponed. The date shown is not the original due date.

## 2015-11-24 NOTE — Progress Notes (Signed)
Subjective:   [name] is a 82 y.o. male who presents for Medicare Annual/Subsequent preventive examination.  Review of Systems:  No ROS.  Medicare Wellness Visit.  Cardiac Risk Factors include: advanced age (>40men, >23 women);hypertension;male gender     Objective:    Vitals: BP (!) 144/82 (BP Location: Left Arm, Patient Position: Sitting, Cuff Size: Normal)   Pulse 68   Temp 97.7 F (36.5 C) (Oral)   Resp 14   Ht 6\' 1"  (1.854 m)   Wt 170 lb 6.4 oz (77.3 kg)   SpO2 98%   BMI 22.48 kg/m   Body mass index is 22.48 kg/m.  Tobacco History  Smoking Status  . Former Smoker  . Types: Cigars  . Quit date: 10/04/1988  Smokeless Tobacco  . Former Systems developer  . Types: Chew  . Quit date: 09/04/1991     Counseling given: Not Answered   Past Medical History:  Diagnosis Date  . Coronary artery disease    a. 1993 s/p PCI/BMS LAD;  b. 2003 PCI RCA;  c. 09/2006 ISR LAD->PCI, PCI RCA;  d. 02/2007 MV: no ischemia;  d. 01/2012 MV: EF 74%, no ischemia.  [name] History of prostate cancer   . Hyperlipidemia   . Hyperthyroidism   . Low testosterone   . PAF (paroxysmal atrial fibrillation) ([place])    a. previously on amio ->stopped in 2013;  b. recurrent PAF 01/2012->Pradaxa and amio initiated, subsequently came off of pradaxa 2/2 cost;  c. 02/2012 Echo: EF 55-60%, Gr 1 DD.  [name] Prostate cancer ([place])   . S/P CABG x 4    Past Surgical History:  Procedure Laterality Date  . APPENDECTOMY     Dr. Bary Castilla  . CARDIAC CATHETERIZATION  2008  . CLIPPING OF ATRIAL APPENDAGE Left 05/18/2012   Procedure: CLIPPING OF ATRIAL APPENDAGE;  Surgeon: [name], MD;  Location: [facility];  Service: Open Heart Surgery;  Laterality: Left;  . COLONOSCOPY  2013  . CORONARY ARTERY BYPASS GRAFT N/A 05/18/2012   Procedure: CORONARY ARTERY BYPASS GRAFTING (CABG);  Surgeon: [name], MD;  Location: [facility];  Service: Open Heart Surgery;  Laterality: N/A;  Times 4 using left internal mammary artery and endoscopically  harvested right saphenous vein  . HERNIA REPAIR     s/p mesh bilaterally, Dr. Bary Castilla  . INTRAOPERATIVE TRANSESOPHAGEAL ECHOCARDIOGRAM N/A 05/18/2012   Procedure: INTRAOPERATIVE TRANSESOPHAGEAL ECHOCARDIOGRAM;  Surgeon: [name], MD;  Location: [facility];  Service: Open Heart Surgery;  Laterality: N/A;  . LEFT HEART CATHETERIZATION WITH CORONARY ANGIOGRAM Bilateral 05/16/2012   Procedure: LEFT HEART CATHETERIZATION WITH CORONARY ANGIOGRAM;  Surgeon: Burnell Blanks, MD;  [facility] CATH LAB;  Service: Cardiovascular;  Laterality: Bilateral;  . MAZE N/A 05/18/2012   Procedure: MAZE;  Surgeon: [name], MD;  Location: [place];  Service: Open Heart Surgery;  Laterality: N/A;  . MOHS SURGERY    . PROSTATECTOMY    . TOTAL HIP ARTHROPLASTY Right    Dr. [name]   Family History  Problem Relation Age of Onset  . Coronary artery disease Mother   . Heart disease Mother   . Coronary artery disease Sister   . Diabetes Sister   . Heart disease Sister 57    deceased  . Diabetes Father   . Heart disease Brother    History  Sexual Activity  . Sexual activity: Not Currently    Outpatient Encounter Prescriptions as of 11/24/2015  Medication Sig  . Acetaminophen (TYLENOL  ARTHRITIS PAIN PO) Take by mouth as needed.  [name] aspirin EC 81 MG tablet Take 81 mg by mouth every evening.  . hydrocortisone ointment 0.5 % Apply 1 application topically 2 (two) times daily.  [name] MAGNESIUM-POTASSIUM PO Take by mouth as needed. Reported on 04/14/2015  . phenylephrine (NEO-SYNEPHRINE) 1 % nasal spray Place 1 drop into both nostrils every morning.  . polyethylene glycol powder (GLYCOLAX/MIRALAX) powder TAKE 17 G BY MOUTH AS DIRECTED.  [name] simvastatin (ZOCOR) 40 MG tablet Take 1 tablet (40 mg total) by mouth at bedtime.  [name] testosterone cypionate (DEPOTESTOTERONE CYPIONATE) 200 MG/ML injection Inject into the muscle every 14 (fourteen) days.  . Tetrahydrozoline HCl (EYE DROPS OP) Apply to eye every morning.  .  traMADol (ULTRAM) 50 MG tablet Take 1 tablet (50 mg total) by mouth every 8 (eight) hours as needed.   No facility-administered encounter medications on file as of 11/24/2015.     Activities of Daily Living In your present state of health, do you have any difficulty performing the following activities: 11/24/2015 05/28/2015  Hearing? Y N  Vision? N N  Difficulty concentrating or making decisions? N N  Walking or climbing stairs? N N  Dressing or bathing? N N  Doing errands, shopping? N N  Preparing Food and eating ? N -  Using the Toilet? N -  In the past six months, have you accidently leaked urine? N -  Do you have problems with loss of bowel control? N -  Managing your Medications? N -  Managing your Finances? N -  Housekeeping or managing your Housekeeping? N -  Some recent data might be hidden    Patient Care Team: [name], DO as PCP - General (Family Medicine) Minna Merritts, MD as Consulting Physician (Cardiology)   Assessment:    This is a routine wellness examination for [name]. The goal of the wellness visit is to assist the patient how to close the gaps in care and create a preventative care plan for the patient.   Taking calcium VIT D as appropriate/Osteoporosis risk reviewed.  Medications reviewed; taking without issues or barriers.  Safety issues reviewed; smoke detectors in the home. No firearms in the home. Wears seatbelts when driving or riding with others. No violence in the home.  No identified risk were noted; The patient was oriented x 3; appropriate in dress and manner and no objective failures at ADL's or IADL's.   BMI; discussed the importance of a healthy diet, water intake and exercise. Educational material provided.  TDAP AND ZOSTAVAX vaccine postponed for follow up with insurance, per patient request.   Patient Concerns: None at this time. Follow up with PCP as needed.  Exercise Activities and Dietary recommendations Current  Exercise Habits: Home exercise routine, Type of exercise: walking, Time (Minutes): 30, Frequency (Times/Week): 3, Weekly Exercise (Minutes/Week): 90, Intensity: Mild  Goals    . Increase water intake          STAY HYDRATED AND DRINK PLENTY OF WATER.      Fall Risk Fall Risk  11/24/2015 05/28/2015 11/28/2014 09/23/2014 08/21/2013  Falls in the past year? No No Yes Yes No  Number falls in past yr: - - 1 1 -  Injury with Fall? - - Yes Yes -  Follow up - - Education provided;Falls prevention discussed - -   Depression Screen PHQ 2/9 Scores 11/24/2015 05/28/2015 11/28/2014 09/23/2014  PHQ - 2 Score 0 0 0 0    Cognitive Function MMSE -  Mini Mental State Exam 11/24/2015 11/28/2014  Orientation to time 5 5  Orientation to Place 5 5  Registration 3 3  Attention/ Calculation 5 5  Recall 3 3  Language- name 2 objects 2 2  Language- repeat 1 1  Language- follow 3 step command 3 3  Language- read & follow direction 1 1  Write a sentence 1 1  Copy design 1 1  Total score 30 30        Immunization History  Administered Date(s) Administered  . Influenza Split 10/05/2010, 10/27/2011, 10/10/2013  . Influenza, High Dose Seasonal PF 09/23/2015  . Influenza,inj,Quad PF,36+ Mos 09/18/2012, 11/04/2014  . Pneumococcal Conjugate-13 02/19/2013  . Pneumococcal Polysaccharide-23 11/15/2007   Screening Tests Health Maintenance  Topic Date Due  . ZOSTAVAX  11/22/2016 (Originally 12/02/1994)  . TETANUS/TDAP  11/22/2016 (Originally [birth year]01/1951)  . INFLUENZA VACCINE  Completed  . PNA vac Low Risk Adult  Completed      Plan:    End of life planning; Advance aging; Advanced directives discussed. No HCPOA/Living Will.  Additional information provided to help him start the conversation with his family.  Copy of HCPOA/Living Will short forms requested upon completion.  Total time spent on this topic is 20 minutes.  Follow up with PCP as needed.  During the course of the visit the patient was educated  and counseled about the following appropriate screening and preventive services:   Vaccines to include Pneumoccal, Influenza, Hepatitis B, Td, Zostavax, HCV  Electrocardiogram  Cardiovascular Disease  Colorectal cancer screening  Diabetes screening  Prostate Cancer Screening  Glaucoma screening  Nutrition counseling   Smoking cessation counseling  Patient Instructions (the written plan) was given to the patient.    Varney Biles, LPN  [phone]

## 2015-11-25 DIAGNOSIS — H40153 Residual stage of open-angle glaucoma, bilateral: Secondary | ICD-10-CM | POA: Diagnosis not present

## 2015-11-30 ENCOUNTER — Ambulatory Visit: Payer: PPO

## 2015-12-01 DIAGNOSIS — E291 Testicular hypofunction: Secondary | ICD-10-CM | POA: Diagnosis not present

## 2015-12-01 DIAGNOSIS — Z8546 Personal history of malignant neoplasm of prostate: Secondary | ICD-10-CM | POA: Diagnosis not present

## 2015-12-29 ENCOUNTER — Encounter: Payer: Self-pay | Admitting: Family Medicine

## 2015-12-29 ENCOUNTER — Telehealth: Payer: Self-pay

## 2015-12-29 ENCOUNTER — Ambulatory Visit (INDEPENDENT_AMBULATORY_CARE_PROVIDER_SITE_OTHER): Payer: PPO | Admitting: Family Medicine

## 2015-12-29 DIAGNOSIS — K5900 Constipation, unspecified: Secondary | ICD-10-CM | POA: Diagnosis not present

## 2015-12-29 NOTE — Telephone Encounter (Signed)
-----   Message from [name], DO sent at 12/29/2015  3:06 PM EST ----- Inform patient please.  ----- Message ----- From: Robert Bellow, MD Sent: 12/29/2015   1:55 PM To: [name], DO  2013 study was normal, but if no medication is responsible for constipation (initiation of calcium channel blockers, etc) colonoscopy would be reasonable as a diagnostic rather than a screening procedure. Would be glad to see him after I get back from [place]. ----- Message ----- From: [name], DO Sent: 12/29/2015   1:28 PM To: Robert Bellow, MD  Dr. Bary Castilla,  The above patient has been having significant constipation and decrease in the caliber/size of stool. He want me to inquire about the need for a colonoscopy (no reports of hematochezia, melena, or other alarming features). He does have a history of prostatectomy. Thoughts?  [place]

## 2015-12-29 NOTE — Assessment & Plan Note (Signed)
Acute on chronic. Advised BID Miralax, Daily (or nightly) colace, increase in activity and water consumption.

## 2015-12-29 NOTE — Telephone Encounter (Signed)
Per DPR pt wife was given information and stated they'd call after the holidays.

## 2015-12-29 NOTE — Patient Instructions (Signed)
Miralax (17 g twice daily).  Increase fluid intake and fiber intake.  Colace as well.  I will send a message to Dr. Bary Castilla.  Follow up in ~ 3 months.  Take care  Dr. Lacinda Axon

## 2015-12-29 NOTE — Progress Notes (Signed)
Subjective:  Patient ID: [name], male    DOB: 12-Feb-1932  Age: 82 y.o. MRN: [id number]  CC: Constipation  HPI:  82 year old male with atrophic fibrillation, CAD status post CABG, AAA, hypertension, hyperlipidemia, history of prostate cancer presents with complaints of constipation.  Patient states that he's had worsening constipation for the past 3-4 months. This is been a chronic issue for him for quite some time. He reports that he's had decrease in the caliber/size of his stool. He reports that he doesn't feel like he passes much gas and has infrequent bowel movement. He does not strain much as he's worried he is "going to have a stroke". He uses MiraLAX infrequently as well as Dulcolax to help reduce a bowel movement. He's also used enemas. He states that he has a bowel movement approximately every 3 days with the aid of some sort of medication. No reports of hematochezia or melena. No reports of abdominal pain. He states that he does not drink very much water and is not particularly active. No other associated symptoms. No other complaints or concerns at this time.  Social Hx   Social History   Social History  . Marital status: Married    Spouse name: N/A  . Number of children: N/A  . Years of education: N/A   Social History Main Topics  . Smoking status: Former Smoker    Types: Cigars    Quit date: 10/04/1988  . Smokeless tobacco: Former Systems developer    Types: [place] date: 09/04/1991  . Alcohol use No     Comment: occassionaly  . Drug use: No  . Sexual activity: Not Currently   Other Topics Concern  . None   Social History Narrative   Lives in [place] with wife. Has 2 cats, dog died. Daughter in [place], 2 step-daughters. Retired from [employer], worked in Radio producer. Grew up in [place].   Review of Systems  Constitutional: Negative.   Gastrointestinal: Positive for constipation.   Objective:  BP (!) 145/69 (BP Location: Left Arm, Patient Position: Sitting, Cuff  Size: Normal)   Pulse 72   Temp 97.7 F (36.5 C) (Oral)   Resp 14   Wt 168 lb 6 oz (76.4 kg)   SpO2 98%   BMI 22.21 kg/m   BP/Weight 12/29/2015 11/24/2015 [id number]  Systolic BP [id number]  Diastolic BP 69 82 90  Wt. (Lbs) 168.38 170.4 168.75  BMI 22.21 22.48 22.26    Physical Exam  Constitutional: He is oriented to person, place, and time. He appears well-developed. No distress.  Pulmonary/Chest: Effort normal.  Abdominal: Soft. He exhibits no distension. There is no tenderness. There is no rebound and no guarding.  Neurological: He is alert and oriented to person, place, and time.  Psychiatric: He has a normal mood and affect.  Vitals reviewed.  Lab Results  Component Value Date   WBC 6.7 04/14/2015   HGB 14.6 04/14/2015   HCT 43.7 04/14/2015   PLT 218.0 04/14/2015   GLUCOSE 120 (H) 04/14/2015   CHOL 128 05/28/2015   TRIG 85.0 05/28/2015   HDL 31.60 (L) 05/28/2015   LDLCALC 79 05/28/2015   ALT 12 04/14/2015   AST 17 04/14/2015   NA 139 04/14/2015   K 4.3 04/14/2015   CL 102 04/14/2015   CREATININE 1.03 04/14/2015   BUN 20 04/14/2015   CO2 29 04/14/2015   TSH 1.97 09/23/2014   PSA 0.00 (L) 04/14/2015   INR 2.97 06/08/2012  HGBA1C 5.9 04/14/2015   MICROALBUR 1.7 08/12/2013    Assessment & Plan:   Problem List Items Addressed This Visit    Constipation    Acute on chronic. Advised BID Miralax, Daily (or nightly) colace, increase in activity and water consumption.        Follow-up: 3-6 months.  [place]

## 2015-12-29 NOTE — Progress Notes (Signed)
Pre visit review using our clinic review tool, if applicable. No additional management support is needed unless otherwise documented below in the visit note. 

## 2016-03-28 DIAGNOSIS — M5134 Other intervertebral disc degeneration, thoracic region: Secondary | ICD-10-CM | POA: Diagnosis not present

## 2016-03-28 DIAGNOSIS — M546 Pain in thoracic spine: Secondary | ICD-10-CM | POA: Diagnosis not present

## 2016-03-28 DIAGNOSIS — R0781 Pleurodynia: Secondary | ICD-10-CM | POA: Diagnosis not present

## 2016-05-21 NOTE — Progress Notes (Signed)
Cardiology Office Note  Date:  05/23/2016   ID:  [name], DOB 1932-05-16, MRN [id number]  PCP:  Darrell Spikes, DO   Chief Complaint  Patient presents with  . other    38mo f/u Pt  Reviewed meds with pt verbally.    HPI:   [name] is a very pleasant 83 year old gentleman with a history of  coronary artery disease, bypass surgery may 2014  atrial fibrillation, previously on amiodarone, s/p maze stent placed to his LAD in 1993, stent placed to his mid RCA in 2003,  in-stent restenosis with repeat stent placed to his LAD in September 08,  stent placed to his RCA at the same time,  stress test in February 2009 and Jan 2014 which showed no ischemia,   low testosterone  Constipation Back pain He presents for routine followup of his coronary artery disease  Sees chiropractic for his arthritis pain  He is not exercising as he did in the past  Was going to fitness gym in the hospital 2 or 3 days per week, stopped Denies any symptoms of chest pain concerning for angina  Lab work reviewed with him Total chol 128,LDL 79 from 2017  He is requesting repeat lab work today  Long discussion concerning primary preventive care  EKG personally reviewed by myself on todays visit Shows normal sinus rhythm rate 74 bpm no significant ST or T-wave changes  Other past medical history Previous episode of dizziness in 2015 dizziness. He was unable to get out of bed without assistance. Dizziness occurred while supine as well as sitting and standing. He went to the emergency room, was told that he could be dehydrated and was given IV fluids x2 bags. This seemed to improve his symptoms and was discharged home. Other workup in the hospital was negative including negative cardiac enzymes, normal basic metabolic panel, normal LFTs, normal EKG  Previously had chest pain in may 2014. Presented to [facility], cardiac catheterization showing subtotal occlusion of his LAD stent, in-stent restenosis, severe  OM1 and OM 2 disease also with RCA disease,  On  05/18/2012.  He underwent CABG x 4 utilizing LIMA to LAD, SVG to OM1, SVG to OM2, and SVG to PDA.  He also underwent Complete MAZE procedure with Clipping of Left Atrial Appendage.    He did not tolerate beta blockers or ACE inhibitor he had malaise, bradycardia. He took himself off these medications.   In  January 2014 he  had an episode of atrial fibrillation and presented to the emergency room. Converted back to normal sinus rhythm. Discharged on pradaxa. Recurrent episodes of atrial fibrillation shortly after that.   PMH:   has a past medical history of Coronary artery disease; History of prostate cancer; Hyperlipidemia; Hyperthyroidism; Low testosterone; PAF (paroxysmal atrial fibrillation) ([place]); Prostate cancer ([place]); and S/P CABG x 4.  PSH:    Past Surgical History:  Procedure Laterality Date  . APPENDECTOMY     Dr. Bary Castilla  . CARDIAC CATHETERIZATION  2008  . CLIPPING OF ATRIAL APPENDAGE Left 05/18/2012   Procedure: CLIPPING OF ATRIAL APPENDAGE;  Surgeon: [name], MD;  Location: [place];  Service: Open Heart Surgery;  Laterality: Left;  . COLONOSCOPY  2013  . CORONARY ARTERY BYPASS GRAFT N/A 05/18/2012   Procedure: CORONARY ARTERY BYPASS GRAFTING (CABG);  Surgeon: [name], MD;  Location: [place];  Service: Open Heart Surgery;  Laterality: N/A;  Times 4 using left internal mammary artery and endoscopically harvested right saphenous  vein  . HERNIA REPAIR     s/p mesh bilaterally, Dr. Bary Castilla  . INTRAOPERATIVE TRANSESOPHAGEAL ECHOCARDIOGRAM N/A 05/18/2012   Procedure: INTRAOPERATIVE TRANSESOPHAGEAL ECHOCARDIOGRAM;  Surgeon: [name], MD;  Location: [facility];  Service: Open Heart Surgery;  Laterality: N/A;  . LEFT HEART CATHETERIZATION WITH CORONARY ANGIOGRAM Bilateral 05/16/2012   Procedure: LEFT HEART CATHETERIZATION WITH CORONARY ANGIOGRAM;  Surgeon: Burnell Blanks, MD;  Location[facility] CATH LAB;  Service: Cardiovascular;   Laterality: Bilateral;  . MAZE N/A 05/18/2012   Procedure: MAZE;  Surgeon: [name], MD;  Location: [place];  Service: Open Heart Surgery;  Laterality: N/A;  . MOHS SURGERY    . PROSTATECTOMY    . TOTAL HIP ARTHROPLASTY Right    Dr. [name]    Current Outpatient Prescriptions  Medication Sig Dispense Refill  . Acetaminophen (TYLENOL ARTHRITIS PAIN PO) Take by mouth as needed.    . ALOE VERA JUICE LIQD Take by mouth daily.    . APPLE CIDER VINEGAR PO Take by mouth daily.    [name] aspirin EC 81 MG tablet Take 81 mg by mouth every evening.    . hydrocortisone ointment 0.5 % Apply 1 application topically 2 (two) times daily.    [name] ipratropium (ATROVENT) 0.06 % nasal spray Place 1 spray into both nostrils daily.    [name] MAGNESIUM CITRATE PO Take by mouth daily.    . phenylephrine (NEO-SYNEPHRINE) 1 % nasal spray Place 1 drop into both nostrils every morning.    . polyethylene glycol powder (GLYCOLAX/MIRALAX) powder TAKE 17 G BY MOUTH AS DIRECTED. 527 g 5  . simvastatin (ZOCOR) 40 MG tablet Take 1 tablet (40 mg total) by mouth at bedtime. 90 tablet 3  . testosterone cypionate (DEPOTESTOTERONE CYPIONATE) 200 MG/ML injection Inject into the muscle every 14 (fourteen) days.    . Tetrahydrozoline HCl (EYE DROPS OP) Apply to eye every morning.     No current facility-administered medications for this visit.      Allergies:   Ambien [zolpidem tartrate]; Statins; and Zolpidem tartrate   Social History:  The patient  reports that he quit smoking about 27 years ago. His smoking use included Cigars. He quit smokeless tobacco use about 24 years ago. His smokeless tobacco use included Chew. He reports that he does not drink alcohol or use drugs.   Family History:   family history includes Coronary artery disease in his mother and sister; Diabetes in his father and sister; Heart disease in his brother and mother; Heart disease (age of onset: 17) in his sister.    Review of Systems: Review of Systems   Constitutional: Negative.   Respiratory: Negative.   Cardiovascular: Negative.   Gastrointestinal: Negative.   Musculoskeletal: Positive for back pain.  Neurological: Negative.   Psychiatric/Behavioral: Negative.   All other systems reviewed and are negative.    PHYSICAL EXAM: VS:  BP 130/80 (BP Location: Left Arm, Patient Position: Sitting, Cuff Size: Normal)   Pulse 74   Ht 6\' 1"  (1.854 m)   Wt 169 lb 12 oz (77 kg)   BMI 22.40 kg/m  , BMI Body mass index is 22.4 kg/m.  GEN: Well nourished, well developed, in no acute distress  HEENT: normal  Neck: no JVD, carotid bruits, or masses Cardiac: RRR; no murmurs, rubs, or gallops,no edema  Respiratory:  clear to auscultation bilaterally, normal work of breathing GI: soft, nontender, nondistended, + BS MS: no deformity or atrophy  Skin: warm and dry, no rash Neuro:  Strength and sensation are intact Psych: euthymic mood, full affect    Recent Labs: No results found for requested labs within last 8760 hours.    Lipid Panel Lab Results  Component Value Date   CHOL 128 05/28/2015   HDL 31.60 (L) 05/28/2015   LDLCALC 79 05/28/2015   TRIG 85.0 05/28/2015      Wt Readings from Last 3 Encounters:  05/23/16 169 lb 12 oz (77 kg)  12/29/15 168 lb 6 oz (76.4 kg)  11/24/15 170 lb 6.4 oz (77.3 kg)       ASSESSMENT AND PLAN:   Atrial fibrillation, unspecified type ([place]) - Plan: EKG 12-Lead Maintaining normal sinus rhythm Last episode in 2014, no longer on anticoagulation  Atherosclerosis of native coronary artery of native heart without angina pectoris - Plan: EKG 12-Lead No regular exercise , denies any chest pain   Pure hypercholesterolemia Labs drawn today at his request, order placed   HYPERTENSION, BENIGN Blood pressure is well controlled on today's visit. No changes made to the medications.  Aortic atherosclerosis (HCC) Stressed importance of aggressive lipid control  AAA (abdominal aortic aneurysm)  without rupture (H[facility]) 3.2 cm by CT scan April 2017  no further imaging at this time  S/P CABG x 4 Currently with no symptoms concerning for angina  Chronic low back pain, unspecified back pain laterality, with sciatica presence unspecified Recommended back exercises, regular exercise program  he sees chiropractic    Total encounter time more than 25 minutes  Greater than 50% was spent in counseling and coordination of care with the patient   Disposition:   F/U  12 months   Orders Placed This Encounter  Procedures  . EKG 12-Lead     Signed, Esmond Plants, M.D., Ph.D. 05/23/2016  [place], [place]

## 2016-05-23 ENCOUNTER — Ambulatory Visit (INDEPENDENT_AMBULATORY_CARE_PROVIDER_SITE_OTHER): Payer: PPO | Admitting: Cardiovascular Disease

## 2016-05-23 ENCOUNTER — Encounter: Payer: Self-pay | Admitting: Cardiovascular Disease

## 2016-05-23 VITALS — BP 130/80 | HR 74 | Ht 73.0 in | Wt 169.8 lb

## 2016-05-23 DIAGNOSIS — I700 Atherosclerosis of aorta: Secondary | ICD-10-CM | POA: Diagnosis not present

## 2016-05-23 DIAGNOSIS — I714 Abdominal aortic aneurysm, without rupture, unspecified: Secondary | ICD-10-CM

## 2016-05-23 DIAGNOSIS — Z951 Presence of aortocoronary bypass graft: Secondary | ICD-10-CM | POA: Diagnosis not present

## 2016-05-23 DIAGNOSIS — I10 Essential (primary) hypertension: Secondary | ICD-10-CM | POA: Diagnosis not present

## 2016-05-23 DIAGNOSIS — I480 Paroxysmal atrial fibrillation: Secondary | ICD-10-CM | POA: Diagnosis not present

## 2016-05-23 DIAGNOSIS — E782 Mixed hyperlipidemia: Secondary | ICD-10-CM

## 2016-05-23 DIAGNOSIS — I25118 Atherosclerotic heart disease of native coronary artery with other forms of angina pectoris: Secondary | ICD-10-CM

## 2016-05-23 NOTE — Patient Instructions (Addendum)
Medication Instructions:   No medication changes made  Labwork:  We will check liver and lipid, HBA1C  Testing/Procedures:  No further testing at this time   I recommend watching educational videos on topics of interest to you at:       www.goemmi.com  Enter code: [email or web address]    Follow-Up: It was a pleasure seeing you in the office today. Please call us if you have new issues that need to be addressed before your next appt.  [phone]  Your physician wants you to follow-up in: 12 months.  You will receive a reminder letter in the mail two months in advance. If you don't receive a letter, please call our office to schedule the follow-up appointment.  If you need a refill on your cardiac medications before your next appointment, please call your pharmacy.

## 2016-05-24 ENCOUNTER — Other Ambulatory Visit: Payer: Self-pay | Admitting: Family Medicine

## 2016-05-24 DIAGNOSIS — H40153 Residual stage of open-angle glaucoma, bilateral: Secondary | ICD-10-CM | POA: Diagnosis not present

## 2016-05-24 LAB — LIPID PANEL
Chol/HDL Ratio: 3.6 ratio (ref 0.0–5.0)
Cholesterol, Total: 138 mg/dL (ref 100–199)
HDL: 38 mg/dL — ABNORMAL LOW (ref >39)
LDL CALC: 83 mg/dL (ref 0–99)
Triglycerides: 85 mg/dL (ref 0–149)
VLDL Cholesterol Cal: 17 mg/dL (ref 5–40)

## 2016-05-24 LAB — HEPATIC FUNCTION PANEL
ALT: 13 IU/L (ref 0–44)
AST: 20 IU/L (ref 0–40)
Albumin: 4.7 g/dL (ref 3.5–4.7)
Alkaline Phosphatase: 91 IU/L (ref 39–117)
BILIRUBIN, DIRECT: 0.23 mg/dL (ref 0.00–0.40)
Bilirubin Total: 1 mg/dL (ref 0.0–1.2)
TOTAL PROTEIN: 7.2 g/dL (ref 6.0–8.5)

## 2016-05-24 LAB — HGB A1C W/O EAG: Hgb A1c MFr Bld: 5.8 % — ABNORMAL HIGH (ref 4.8–5.6)

## 2016-05-24 MED ORDER — POLYETHYLENE GLYCOL 3350 17 GM/SCOOP PO POWD: ORAL | 5 refills | Status: DC

## 2016-05-24 NOTE — Telephone Encounter (Signed)
Refilled: 05/15/15 Last OV: 12/29/15 Last Labs: 05/23/16 Future OV: none Please advise?

## 2016-05-30 ENCOUNTER — Other Ambulatory Visit: Payer: Self-pay

## 2016-05-30 ENCOUNTER — Telehealth: Payer: Self-pay | Admitting: Cardiovascular Disease

## 2016-05-30 MED ORDER — EZETIMIBE 10 MG PO TABS: 10.0000 mg | ORAL_TABLET | Freq: Every day | ORAL | 3 refills | Status: DC

## 2016-05-30 NOTE — Telephone Encounter (Signed)
Patient came by office for results. Please call.  Patient will be home in 30 minutes.

## 2016-06-08 DIAGNOSIS — Z8546 Personal history of malignant neoplasm of prostate: Secondary | ICD-10-CM | POA: Diagnosis not present

## 2016-06-08 DIAGNOSIS — E291 Testicular hypofunction: Secondary | ICD-10-CM | POA: Diagnosis not present

## 2016-06-08 DIAGNOSIS — K5909 Other constipation: Secondary | ICD-10-CM | POA: Diagnosis not present

## 2016-06-08 DIAGNOSIS — Z79899 Other long term (current) drug therapy: Secondary | ICD-10-CM | POA: Diagnosis not present

## 2016-06-13 DIAGNOSIS — H40153 Residual stage of open-angle glaucoma, bilateral: Secondary | ICD-10-CM | POA: Diagnosis not present

## 2016-06-14 ENCOUNTER — Other Ambulatory Visit: Payer: Self-pay

## 2016-06-14 ENCOUNTER — Encounter: Payer: Self-pay | Admitting: Gastroenterology

## 2016-06-14 ENCOUNTER — Ambulatory Visit (INDEPENDENT_AMBULATORY_CARE_PROVIDER_SITE_OTHER): Payer: PPO | Admitting: Gastroenterology

## 2016-06-14 VITALS — BP 139/78 | HR 76 | Temp 98.0°F | Ht 73.0 in | Wt 167.6 lb

## 2016-06-14 DIAGNOSIS — R194 Change in bowel habit: Secondary | ICD-10-CM

## 2016-06-14 DIAGNOSIS — K5900 Constipation, unspecified: Secondary | ICD-10-CM

## 2016-06-14 DIAGNOSIS — R195 Other fecal abnormalities: Secondary | ICD-10-CM

## 2016-06-14 NOTE — Progress Notes (Signed)
Gastroenterology Consultation  Referring Provider:     Abbie Sons, MD Primary Care Physician:  [name], DO Primary Gastroenterologist:  Dr. Allen Norris     Reason for Consultation:     Constipation        HPI:   [name] is a 83 y.o. y/o male referred for consultation & management of Constipation by Dr. Lacinda Axon, Barnie Del, DO.  This patient comes in today with a history of constipation.  The patient reports that he thinks his constipation has gotten worse since he had his prostate surgery.  The patient is concerned that some nerve may have been caught that causes him to have constipation.  The patient reports that when he does move his bowels they are string-like and hard to pass.  The patient has been taking MiraLAX and Dulcolax for his constipation.  There is no report of any abdominal pain.  He also denies any fevers chills nausea or vomiting.  The patient did have a colonoscopy in the past by Dr. Bary Castilla. The colonoscopy was reported as normal.  There is no report of any unexplained weight loss.  The patient is concerned because of the change in the caliber of his stools and his constipation.  Past Medical History:  Diagnosis Date  . Coronary artery disease    a. 1993 s/p PCI/BMS LAD;  b. 2003 PCI RCA;  c. 09/2006 ISR LAD->PCI, PCI RCA;  d. 02/2007 MV: no ischemia;  d. 01/2012 MV: EF 74%, no ischemia.  [name] History of prostate cancer   . Hyperlipidemia   . Hyperthyroidism   . Low testosterone   . PAF (paroxysmal atrial fibrillation) ([place])    a. previously on amio ->stopped in 2013;  b. recurrent PAF 01/2012->Pradaxa and amio initiated, subsequently came off of pradaxa 2/2 cost;  c. 02/2012 Echo: EF 55-60%, Gr 1 DD.  [name] Prostate cancer ([place])   . S/P CABG x 4     Past Surgical History:  Procedure Laterality Date  . APPENDECTOMY     Dr. Bary Castilla  . CARDIAC CATHETERIZATION  2008  . CLIPPING OF ATRIAL APPENDAGE Left 05/18/2012   Procedure: CLIPPING OF ATRIAL APPENDAGE;  Surgeon: [name], MD;  Location: [place];  Service: Open Heart Surgery;  Laterality: Left;  . COLONOSCOPY  2013  . CORONARY ARTERY BYPASS GRAFT N/A 05/18/2012   Procedure: CORONARY ARTERY BYPASS GRAFTING (CABG);  Surgeon: [name], MD;  Location: [place];  Service: Open Heart Surgery;  Laterality: N/A;  Times 4 using left internal mammary artery and endoscopically harvested right saphenous vein  . HERNIA REPAIR     s/p mesh bilaterally, Dr. Bary Castilla  . INTRAOPERATIVE TRANSESOPHAGEAL ECHOCARDIOGRAM N/A 05/18/2012   Procedure: INTRAOPERATIVE TRANSESOPHAGEAL ECHOCARDIOGRAM;  Surgeon: [name], MD;  Location: [place];  Service: Open Heart Surgery;  Laterality: N/A;  . LEFT HEART CATHETERIZATION WITH CORONARY ANGIOGRAM Bilateral 05/16/2012   Procedure: LEFT HEART CATHETERIZATION WITH CORONARY ANGIOGRAM;  Surgeon: Burnell Blanks, MD;  Location: [facility] CATH LAB;  Service: Cardiovascular;  Laterality: Bilateral;  . MAZE N/A 05/18/2012   Procedure: MAZE;  Surgeon: [name], MD;  Location: [place];  Service: Open Heart Surgery;  Laterality: N/A;  . MOHS SURGERY    . PROSTATECTOMY    . TOTAL HIP ARTHROPLASTY Right    Dr. [name]    Prior to Admission medications   Medication Sig Start Date End Date Taking? Authorizing Provider  ALOE VERA JUICE LIQD Take  by mouth daily.   Yes [provider]  APPLE CIDER VINEGAR PO Take by mouth daily.   Yes [provider]  aspirin EC 81 MG tablet Take 81 mg by mouth every evening.   Yes [provider]  ezetimibe (ZETIA) 10 MG tablet Take 1 tablet (10 mg total) by mouth daily. 05/30/16 08/28/16 Yes Gollan, Kathlene November, MD  hydrocortisone ointment 0.5 % Apply 1 application topically 2 (two) times daily.   Yes [provider]  ipratropium (ATROVENT) 0.06 % nasal spray Place 1 spray into both nostrils daily. 04/23/16  Yes [provider]  latanoprost (XALATAN) 0.005 % ophthalmic solution  05/24/16  Yes [provider]    MAGNESIUM CITRATE PO Take by mouth daily.   Yes [provider]  phenylephrine (NEO-SYNEPHRINE) 1 % nasal spray Place 1 drop into both nostrils every morning.   Yes [provider]  polyethylene glycol powder (GLYCOLAX/MIRALAX) powder TAKE 17 G BY MOUTH AS DIRECTED. 05/24/16  Yes Cook, Jayce G, DO  simvastatin (ZOCOR) 40 MG tablet Take 1 tablet (40 mg total) by mouth at bedtime. 05/25/15  Yes Gollan, Kathlene November, MD  testosterone cypionate (DEPOTESTOTERONE CYPIONATE) 200 MG/ML injection Inject into the muscle every 14 (fourteen) days.   Yes [provider]  Tetrahydrozoline HCl (EYE DROPS OP) Apply to eye every morning.   Yes [provider]  Acetaminophen (TYLENOL ARTHRITIS PAIN PO) Take by mouth as needed.    [provider]    Family History  Problem Relation Age of Onset  . Coronary artery disease Mother   . Heart disease Mother   . Coronary artery disease Sister   . Diabetes Sister   . Heart disease Sister 53       deceased  . Diabetes Father   . Heart disease Brother      Social History  Substance Use Topics  . Smoking status: Former Smoker    Types: Cigars    Quit date: 10/04/1988  . Smokeless tobacco: Former Systems developer    Types: [place] date: 09/04/1991  . Alcohol use No     Comment: occassionaly    Allergies as of 06/14/2016 - Review Complete 06/14/2016  Allergen Reaction Noted  . Ambien [zolpidem tartrate] Other (See Comments) 01/25/2012  . Statins    . Zolpidem tartrate  10/05/2010    Review of Systems:    All systems reviewed and negative except where noted in HPI.   Physical Exam:  BP 139/78   Pulse 76   Temp 98 F (36.7 C) (Oral)   Ht 6\' 1"  (1.854 m)   Wt 167 lb 9.6 oz (76 kg)   BMI 22.11 kg/m  No LMP for male patient. Psych:  Alert and cooperative. Normal mood and affect. General:   Alert,  Well-developed, well-nourished, pleasant and cooperative in NAD Head:  Normocephalic and atraumatic. Eyes:  Sclera  clear, no icterus.   Conjunctiva pink. Ears:  Normal auditory acuity. Nose:  No deformity, discharge, or lesions. Mouth:  No deformity or lesions,oropharynx pink & moist. Neck:  Supple; no masses or thyromegaly. Lungs:  Respirations even and unlabored.  Clear throughout to auscultation.   No wheezes, crackles, or rhonchi. No acute distress. Heart:  Regular rate and rhythm; no murmurs, clicks, rubs, or gallops. Abdomen:  Normal bowel sounds.  No bruits.  Soft, non-tender and non-distended without masses, hepatosplenomegaly or hernias noted.  No guarding or rebound tenderness.  Negative Carnett sign.   Rectal:  Deferred.  Msk:  Symmetrical without gross deformities.  Good, equal movement & strength bilaterally. Pulses:  Normal pulses noted. Extremities:  No clubbing or edema.  No cyanosis. Neurologic:  Alert and oriented x3;  grossly normal neurologically. Skin:  Intact without significant lesions or rashes.  No jaundice. Lymph Nodes:  No significant cervical adenopathy. Psych:  Alert and cooperative. Normal mood and affect.  Imaging Studies: No results found.  Assessment and Plan:   [name] is a 83 y.o. y/o male with change in bowel habits and a change in bowel habits.  The patient had a colonoscopy 5 years ago and at that time the patient was not having any symptoms.  The patient has been told to increase fiber in his diet to see if that helps bulk up his stools.  He will also be set up for colonoscopy to rule out any colon pathology as the cause of his constipation and Pencil like stools. I have discussed risks & benefits which include, but are not limited to, bleeding, infection, perforation & drug reaction.  The patient agrees with this plan & written consent will be obtained.     [name], MD. Marval Regal   Note: This dictation was prepared with Dragon dictation along with smaller phrase technology. Any transcriptional errors that result from this process are unintentional.

## 2016-06-18 ENCOUNTER — Other Ambulatory Visit: Payer: Self-pay | Admitting: Cardiovascular Disease

## 2016-06-18 DIAGNOSIS — E785 Hyperlipidemia, unspecified: Secondary | ICD-10-CM

## 2016-07-12 ENCOUNTER — Encounter
Admission: RE | Disposition: A | Discharge status: Home / Self Care | Payer: Self-pay | Source: Ambulatory Visit | Attending: Gastroenterology

## 2016-07-12 ENCOUNTER — Ambulatory Visit
Admission: RE | Admit: 2016-07-12 | Discharge: 2016-07-12 | Disposition: A | Discharge status: Home / Self Care | Payer: PPO | Source: Ambulatory Visit | Attending: Gastroenterology | Admitting: Gastroenterology

## 2016-07-12 ENCOUNTER — Ambulatory Visit: Payer: PPO | Admitting: Anesthesiology

## 2016-07-12 ENCOUNTER — Encounter: Payer: Self-pay | Admitting: *Deleted

## 2016-07-12 DIAGNOSIS — E785 Hyperlipidemia, unspecified: Secondary | ICD-10-CM | POA: Diagnosis not present

## 2016-07-12 DIAGNOSIS — I7389 Other specified peripheral vascular diseases: Secondary | ICD-10-CM | POA: Diagnosis not present

## 2016-07-12 DIAGNOSIS — R194 Change in bowel habit: Secondary | ICD-10-CM | POA: Insufficient documentation

## 2016-07-12 DIAGNOSIS — Z87891 Personal history of nicotine dependence: Secondary | ICD-10-CM | POA: Diagnosis not present

## 2016-07-12 DIAGNOSIS — Z955 Presence of coronary angioplasty implant and graft: Secondary | ICD-10-CM | POA: Insufficient documentation

## 2016-07-12 DIAGNOSIS — Z951 Presence of aortocoronary bypass graft: Secondary | ICD-10-CM | POA: Insufficient documentation

## 2016-07-12 DIAGNOSIS — K641 Second degree hemorrhoids: Secondary | ICD-10-CM | POA: Diagnosis not present

## 2016-07-12 DIAGNOSIS — I10 Essential (primary) hypertension: Secondary | ICD-10-CM | POA: Diagnosis not present

## 2016-07-12 DIAGNOSIS — Z96641 Presence of right artificial hip joint: Secondary | ICD-10-CM | POA: Insufficient documentation

## 2016-07-12 DIAGNOSIS — K5900 Constipation, unspecified: Secondary | ICD-10-CM | POA: Diagnosis not present

## 2016-07-12 DIAGNOSIS — I480 Paroxysmal atrial fibrillation: Secondary | ICD-10-CM | POA: Diagnosis not present

## 2016-07-12 DIAGNOSIS — I2510 Atherosclerotic heart disease of native coronary artery without angina pectoris: Secondary | ICD-10-CM | POA: Diagnosis not present

## 2016-07-12 DIAGNOSIS — Z79899 Other long term (current) drug therapy: Secondary | ICD-10-CM | POA: Insufficient documentation

## 2016-07-12 DIAGNOSIS — Z888 Allergy status to other drugs, medicaments and biological substances status: Secondary | ICD-10-CM | POA: Insufficient documentation

## 2016-07-12 DIAGNOSIS — E0590 Thyrotoxicosis, unspecified without thyrotoxic crisis or storm: Secondary | ICD-10-CM | POA: Diagnosis not present

## 2016-07-12 DIAGNOSIS — M1990 Unspecified osteoarthritis, unspecified site: Secondary | ICD-10-CM | POA: Diagnosis not present

## 2016-07-12 DIAGNOSIS — K635 Polyp of colon: Secondary | ICD-10-CM | POA: Diagnosis not present

## 2016-07-12 DIAGNOSIS — D122 Benign neoplasm of ascending colon: Secondary | ICD-10-CM | POA: Diagnosis not present

## 2016-07-12 DIAGNOSIS — I4891 Unspecified atrial fibrillation: Secondary | ICD-10-CM | POA: Diagnosis not present

## 2016-07-12 DIAGNOSIS — K5909 Other constipation: Secondary | ICD-10-CM | POA: Diagnosis not present

## 2016-07-12 DIAGNOSIS — Z8546 Personal history of malignant neoplasm of prostate: Secondary | ICD-10-CM | POA: Insufficient documentation

## 2016-07-12 HISTORY — DX: Unspecified osteoarthritis, unspecified site: M19.90

## 2016-07-12 HISTORY — PX: COLONOSCOPY WITH PROPOFOL: SHX5780

## 2016-07-12 SURGERY — COLONOSCOPY WITH PROPOFOL
Anesthesia: General

## 2016-07-12 MED ORDER — LIDOCAINE HCL (CARDIAC) 20 MG/ML IV SOLN
INTRAVENOUS | Status: DC | PRN
  Administered 2016-07-12: 80 mg via INTRAVENOUS

## 2016-07-12 MED ORDER — PROPOFOL 500 MG/50ML IV EMUL
INTRAVENOUS | Status: AC
  Filled 2016-07-12: qty 50

## 2016-07-12 MED ORDER — PROPOFOL 10 MG/ML IV BOLUS
INTRAVENOUS | Status: DC | PRN
  Administered 2016-07-12: 50 mg via INTRAVENOUS

## 2016-07-12 MED ORDER — SODIUM CHLORIDE 0.9 % IV SOLN
INTRAVENOUS | Status: DC
  Administered 2016-07-12: 08:00:00 via INTRAVENOUS
  Administered 2016-07-12: 1000 mL via INTRAVENOUS

## 2016-07-12 MED ORDER — EPHEDRINE SULFATE 50 MG/ML IJ SOLN
INTRAMUSCULAR | Status: DC | PRN
  Administered 2016-07-12: 10 mg via INTRAVENOUS

## 2016-07-12 MED ORDER — LIDOCAINE HCL (PF) 2 % IJ SOLN
INTRAMUSCULAR | Status: AC
  Filled 2016-07-12: qty 2

## 2016-07-12 MED ORDER — PROPOFOL 500 MG/50ML IV EMUL
INTRAVENOUS | Status: DC | PRN
  Administered 2016-07-12: 100 ug/kg/min via INTRAVENOUS

## 2016-07-12 MED ORDER — EPHEDRINE SULFATE 50 MG/ML IJ SOLN
INTRAMUSCULAR | Status: AC
Start: 2016-07-12 — End: 2016-07-12
  Filled 2016-07-12: qty 1

## 2016-07-12 NOTE — Op Note (Signed)
[facility] Patient Name: [name] Procedure Date: 07/12/2016 8:21 AM MRN: [id number] Account #: [id number] Date of Birth: 1932/06/08 Admit Type: Outpatient Age: 83 Room: [facility] ENDO ROOM 4 Gender: Male Note Status: Finalized Procedure:            Colonoscopy Indications:          Change in bowel habits, Constipation Providers:            [name] MD, MD Referring MD:         Barnie Del. Lacinda Axon MD, MD (Referring MD) Medicines:            Propofol per Anesthesia Complications:        No immediate complications. Procedure:            Pre-Anesthesia Assessment:                       - Prior to the procedure, a History and Physical was                        performed, and patient medications and allergies were                        reviewed. The patient's tolerance of previous                        anesthesia was also reviewed. The risks and benefits of                        the procedure and the sedation options and risks were                        discussed with the patient. All questions were                        answered, and informed consent was obtained. Prior                        Anticoagulants: The patient has taken no previous                        anticoagulant or antiplatelet agents. ASA Grade                        Assessment: II - A patient with mild systemic disease.                        After reviewing the risks and benefits, the patient was                        deemed in satisfactory condition to undergo the                        procedure.                       After obtaining informed consent, the colonoscope was                        passed under direct vision. Throughout the procedure,  the patient's blood pressure, pulse, and oxygen                        saturations were monitored continuously. The                        Colonoscope was introduced through the anus and   advanced to the the cecum, identified by appendiceal                        orifice and ileocecal valve. The colonoscopy was                        performed without difficulty. The patient tolerated the                        procedure well. The quality of the bowel preparation                        was excellent. Findings:      The perianal and digital rectal examinations were normal.      A 4 mm polyp was found in the ascending colon. The polyp was sessile.       The polyp was removed with a cold biopsy forceps. Resection and       retrieval were complete.      Non-bleeding internal hemorrhoids were found during retroflexion. The       hemorrhoids were Grade II (internal hemorrhoids that prolapse but reduce       spontaneously). Impression:           - One 4 mm polyp in the ascending colon, removed with a                        cold biopsy forceps. Resected and retrieved.                       - Non-bleeding internal hemorrhoids. Recommendation:       - Discharge patient to home.                       - Resume previous diet.                       - Continue present medications.                       - Await pathology results. Procedure Code(s):    --- Professional ---                       [id number], Colonoscopy, flexible; with biopsy, single or                        multiple Diagnosis Code(s):    --- Professional ---                       R19.4, Change in bowel habit                       K59.00, Constipation, unspecified                       D12.2, Benign neoplasm  of ascending colon CPT copyright 2016 American Medical Association. All rights reserved. The codes documented in this report are preliminary and upon coder review may  be revised to meet current compliance requirements. [name] MD, MD 07/12/2016 8:40:22 AM This report has been signed electronically. Number of Addenda: 0 Note Initiated On: 07/12/2016 8:21 AM Scope Withdrawal Time: 0 hours 6 minutes 22 seconds  Total  Procedure Duration: 0 hours 12 minutes 18 seconds       [facility]

## 2016-07-12 NOTE — Anesthesia Postprocedure Evaluation (Signed)
Anesthesia Post Note  Patient: [name]  Procedure(s) Performed: Procedure(s) (LRB): COLONOSCOPY WITH PROPOFOL (N/A)  Patient location during evaluation: PACU Anesthesia Type: General Level of consciousness: awake and alert and oriented Pain management: pain level controlled Vital Signs Assessment: post-procedure vital signs reviewed and stable Respiratory status: spontaneous breathing Cardiovascular status: blood pressure returned to baseline Anesthetic complications: no     Last Vitals:  Vitals:   07/12/16 0903 07/12/16 0913  BP: (!) 147/86 (!) 147/88  Pulse: 74 74  Resp: 17 17  Temp:      Last Pain:  Vitals:   07/12/16 0843  TempSrc: Tympanic                 [name]

## 2016-07-12 NOTE — Anesthesia Post-op Follow-up Note (Cosign Needed)
Anesthesia QCDR form completed.        

## 2016-07-12 NOTE — H&P (Signed)
Darrell Lame, MD [facility] [address] [place], [place] [address] Phone:[phone] Fax : [phone]  Primary Care Physician:  [name], DO Primary Gastroenterologist:  Dr. Allen Norris  Pre-Procedure History & Physical: HPI:  [name] is a 83 y.o. male is here for an colonoscopy.   Past Medical History:  Diagnosis Date  . Arthritis   . Coronary artery disease    a. 1993 s/p PCI/BMS LAD;  b. 2003 PCI RCA;  c. 09/2006 ISR LAD->PCI, PCI RCA;  d. 02/2007 MV: no ischemia;  d. 01/2012 MV: EF 74%, no ischemia.  [name] History of prostate cancer   . Hyperlipidemia   . Hyperthyroidism   . Low testosterone   . PAF (paroxysmal atrial fibrillation) ([place])    a. previously on amio ->stopped in 2013;  b. recurrent PAF 01/2012->Pradaxa and amio initiated, subsequently came off of pradaxa 2/2 cost;  c. 02/2012 Echo: EF 55-60%, Gr 1 DD.  [name] Prostate cancer ([place])   . S/P CABG x 4     Past Surgical History:  Procedure Laterality Date  . APPENDECTOMY     Dr. Bary Castilla  . CARDIAC CATHETERIZATION  2008  . CLIPPING OF ATRIAL APPENDAGE Left 05/18/2012   Procedure: CLIPPING OF ATRIAL APPENDAGE;  Surgeon: [name], MD;  Location: [place];  Service: Open Heart Surgery;  Laterality: Left;  . COLONOSCOPY  2013  . CORONARY ARTERY BYPASS GRAFT N/A 05/18/2012   Procedure: CORONARY ARTERY BYPASS GRAFTING (CABG);  Surgeon: [name], MD;  Location: [place];  Service: Open Heart Surgery;  Laterality: N/A;  Times 4 using left internal mammary artery and endoscopically harvested right saphenous vein  . HERNIA REPAIR     s/p mesh bilaterally, Dr. Bary Castilla  . INTRAOPERATIVE TRANSESOPHAGEAL ECHOCARDIOGRAM N/A 05/18/2012   Procedure: INTRAOPERATIVE TRANSESOPHAGEAL ECHOCARDIOGRAM;  Surgeon: [name], MD;  Location: [place];  Service: Open Heart Surgery;  Laterality: N/A;  . LEFT HEART CATHETERIZATION WITH CORONARY ANGIOGRAM Bilateral 05/16/2012   Procedure: LEFT HEART CATHETERIZATION WITH CORONARY ANGIOGRAM;   Surgeon: Burnell Blanks, MD;  Location: [facility] CATH LAB;  Service: Cardiovascular;  Laterality: Bilateral;  . MAZE N/A 05/18/2012   Procedure: MAZE;  Surgeon: [name], MD;  Location: [place];  Service: Open Heart Surgery;  Laterality: N/A;  . MOHS SURGERY    . PROSTATECTOMY    . TOTAL HIP ARTHROPLASTY Right    Dr. [name]    Prior to Admission medications   Medication Sig Start Date End Date Taking? Authorizing Provider  Acetaminophen (TYLENOL ARTHRITIS PAIN PO) Take by mouth as needed.    [provider]  ALOE VERA JUICE LIQD Take by mouth daily.    [provider]  APPLE CIDER VINEGAR PO Take by mouth daily.    [provider]  aspirin EC 81 MG tablet Take 81 mg by mouth every evening.    [provider]  ezetimibe (ZETIA) 10 MG tablet Take 1 tablet (10 mg total) by mouth daily. 05/30/16 08/28/16  Minna Merritts, MD  ipratropium (ATROVENT) 0.06 % nasal spray Place 1 spray into both nostrils daily. 04/23/16   [provider]  latanoprost (XALATAN) 0.005 % ophthalmic solution  05/24/16   [provider]  MAGNESIUM CITRATE PO Take by mouth daily.    [provider]  phenylephrine (NEO-SYNEPHRINE) 1 % nasal spray Place 1 drop into both nostrils every morning.    [provider]  polyethylene glycol powder (GLYCOLAX/MIRALAX) powder TAKE 17  G BY MOUTH AS DIRECTED. 05/24/16   Thersa Salt G, DO  simvastatin (ZOCOR) 40 MG tablet TAKE 1 TABLET (40 MG TOTAL) BY MOUTH AT BEDTIME. 06/20/16   Gollan, Kathlene November, MD  testosterone cypionate (DEPOTESTOTERONE CYPIONATE) 200 MG/ML injection Inject into the muscle every 14 (fourteen) days.    [provider]  Tetrahydrozoline HCl (EYE DROPS OP) Apply to eye every morning.    [provider]    Allergies as of 06/14/2016 - Review Complete 06/14/2016  Allergen Reaction Noted  . Ambien [zolpidem tartrate] Other (See Comments) 01/25/2012  . Statins    . Zolpidem  tartrate  10/05/2010    Family History  Problem Relation Age of Onset  . Coronary artery disease Mother   . Heart disease Mother   . Coronary artery disease Sister   . Diabetes Sister   . Heart disease Sister 73       deceased  . Diabetes Father   . Heart disease Brother     Social History   Social History  . Marital status: Married    Spouse name: N/A  . Number of children: N/A  . Years of education: N/A   Occupational History  . Not on file.   Social History Main Topics  . Smoking status: Former Smoker    Types: Cigars    Quit date: 10/04/1988  . Smokeless tobacco: Former Systems developer    Types: [place] date: 09/04/1991  . Alcohol use No     Comment: occassionaly  . Drug use: No  . Sexual activity: Not Currently   Other Topics Concern  . Not on file   Social History Narrative   Lives in [place] with wife. Has 2 cats, dog died. Daughter in [place], 2 step-daughters. Retired from [employer], worked in Radio producer. Grew up in [place].    Review of Systems: See HPI, otherwise negative ROS  Physical Exam: BP (!) 149/91   Pulse 85   Temp (!) 96.8 F (36 C) (Tympanic)   Resp 18   Ht 6\' 1"  (1.854 m)   Wt 170 lb (77.1 kg)   SpO2 99%   BMI 22.43 kg/m  General:   Alert,  pleasant and cooperative in NAD Head:  Normocephalic and atraumatic. Neck:  Supple; no masses or thyromegaly. Lungs:  Clear throughout to auscultation.    Heart:  Regular rate and rhythm. Abdomen:  Soft, nontender and nondistended. Normal bowel sounds, without guarding, and without rebound.   Neurologic:  Alert and  oriented x4;  grossly normal neurologically.  Impression/Plan: [name] is here for an colonoscopy to be performed for constipation  Risks, benefits, limitations, and alternatives regarding  colonoscopy have been reviewed with the patient.  Questions have been answered.  All parties agreeable.   Darrell Lame, MD  07/12/2016, 7:47 AM

## 2016-07-12 NOTE — Anesthesia Preprocedure Evaluation (Signed)
Anesthesia Evaluation  Patient identified by MRN, date of birth, ID band Patient awake    Reviewed: Allergy & Precautions, H&P , NPO status , Patient's Chart, lab work & pertinent test results, reviewed documented beta blocker date and time   Airway Mallampati: II  TM Distance: >3 FB Neck ROM: full    Dental   Pulmonary neg pulmonary ROS, former smoker,    breath sounds clear to auscultation       Cardiovascular hypertension, On Medications + CAD, + Cardiac Stents and + Peripheral Vascular Disease  + dysrhythmias Atrial Fibrillation  Rhythm:regular     Neuro/Psych negative neurological ROS  negative psych ROS   GI/Hepatic negative GI ROS, Neg liver ROS,   Endo/Other  Hyperthyroidism   Renal/GU negative Renal ROS  negative genitourinary   Musculoskeletal  (+) Arthritis ,   Abdominal   Peds  Hematology negative hematology ROS (+)   Anesthesia Other Findings See surgeon's H&P   Reproductive/Obstetrics negative OB ROS                             Anesthesia Physical  Anesthesia Plan  ASA: III  Anesthesia Plan: General   Post-op Pain Management:    Induction: Intravenous  PONV Risk Score and Plan:   Airway Management Planned: Nasal Cannula  Additional Equipment:   Intra-op Plan:   Post-operative Plan:   Informed Consent: I have reviewed the patients History and Physical, chart, labs and discussed the procedure including the risks, benefits and alternatives for the proposed anesthesia with the patient or authorized representative who has indicated his/her understanding and acceptance.   Dental Advisory Given  Plan Discussed with: CRNA and Surgeon  Anesthesia Plan Comments:         Anesthesia Quick Evaluation

## 2016-07-12 NOTE — Transfer of Care (Signed)
Immediate Anesthesia Transfer of Care Note  Patient: [name]  Procedure(s) Performed: Procedure(s): COLONOSCOPY WITH PROPOFOL (N/A)  Patient Location: Endoscopy Unit  Anesthesia Type:General  Level of Consciousness: drowsy and patient cooperative  Airway & Oxygen Therapy: Patient Spontanous Breathing and Patient connected to nasal cannula oxygen  Post-op Assessment: Report given to RN and Post -op Vital signs reviewed and stable  Post vital signs: Reviewed and stable  Last Vitals:  Vitals:   07/12/16 0719  BP: (!) 149/91  Pulse: 85  Resp: 18  Temp: (!) 36 C    Last Pain:  Vitals:   07/12/16 0719  TempSrc: Tympanic         Complications: No apparent anesthesia complications

## 2016-07-14 ENCOUNTER — Encounter: Payer: Self-pay | Admitting: Gastroenterology

## 2016-07-14 LAB — SURGICAL PATHOLOGY

## 2016-07-15 ENCOUNTER — Encounter: Payer: Self-pay | Admitting: Gastroenterology

## 2016-08-04 DIAGNOSIS — M9902 Segmental and somatic dysfunction of thoracic region: Secondary | ICD-10-CM | POA: Diagnosis not present

## 2016-08-04 DIAGNOSIS — M62830 Muscle spasm of back: Secondary | ICD-10-CM | POA: Diagnosis not present

## 2016-08-04 DIAGNOSIS — M9903 Segmental and somatic dysfunction of lumbar region: Secondary | ICD-10-CM | POA: Diagnosis not present

## 2016-08-04 DIAGNOSIS — M5134 Other intervertebral disc degeneration, thoracic region: Secondary | ICD-10-CM | POA: Diagnosis not present

## 2016-08-08 DIAGNOSIS — M62830 Muscle spasm of back: Secondary | ICD-10-CM | POA: Diagnosis not present

## 2016-08-08 DIAGNOSIS — M9903 Segmental and somatic dysfunction of lumbar region: Secondary | ICD-10-CM | POA: Diagnosis not present

## 2016-08-08 DIAGNOSIS — M9902 Segmental and somatic dysfunction of thoracic region: Secondary | ICD-10-CM | POA: Diagnosis not present

## 2016-08-08 DIAGNOSIS — M5134 Other intervertebral disc degeneration, thoracic region: Secondary | ICD-10-CM | POA: Diagnosis not present

## 2016-08-10 DIAGNOSIS — M62830 Muscle spasm of back: Secondary | ICD-10-CM | POA: Diagnosis not present

## 2016-08-10 DIAGNOSIS — M9903 Segmental and somatic dysfunction of lumbar region: Secondary | ICD-10-CM | POA: Diagnosis not present

## 2016-08-10 DIAGNOSIS — M9902 Segmental and somatic dysfunction of thoracic region: Secondary | ICD-10-CM | POA: Diagnosis not present

## 2016-08-10 DIAGNOSIS — M5134 Other intervertebral disc degeneration, thoracic region: Secondary | ICD-10-CM | POA: Diagnosis not present

## 2016-08-11 DIAGNOSIS — M62830 Muscle spasm of back: Secondary | ICD-10-CM | POA: Diagnosis not present

## 2016-08-11 DIAGNOSIS — M9903 Segmental and somatic dysfunction of lumbar region: Secondary | ICD-10-CM | POA: Diagnosis not present

## 2016-08-11 DIAGNOSIS — M9902 Segmental and somatic dysfunction of thoracic region: Secondary | ICD-10-CM | POA: Diagnosis not present

## 2016-08-11 DIAGNOSIS — M5134 Other intervertebral disc degeneration, thoracic region: Secondary | ICD-10-CM | POA: Diagnosis not present

## 2016-08-15 DIAGNOSIS — M9902 Segmental and somatic dysfunction of thoracic region: Secondary | ICD-10-CM | POA: Diagnosis not present

## 2016-08-15 DIAGNOSIS — M9903 Segmental and somatic dysfunction of lumbar region: Secondary | ICD-10-CM | POA: Diagnosis not present

## 2016-08-15 DIAGNOSIS — M5134 Other intervertebral disc degeneration, thoracic region: Secondary | ICD-10-CM | POA: Diagnosis not present

## 2016-08-15 DIAGNOSIS — M62830 Muscle spasm of back: Secondary | ICD-10-CM | POA: Diagnosis not present

## 2016-08-17 DIAGNOSIS — M62830 Muscle spasm of back: Secondary | ICD-10-CM | POA: Diagnosis not present

## 2016-08-17 DIAGNOSIS — M9902 Segmental and somatic dysfunction of thoracic region: Secondary | ICD-10-CM | POA: Diagnosis not present

## 2016-08-17 DIAGNOSIS — M9903 Segmental and somatic dysfunction of lumbar region: Secondary | ICD-10-CM | POA: Diagnosis not present

## 2016-08-17 DIAGNOSIS — M5134 Other intervertebral disc degeneration, thoracic region: Secondary | ICD-10-CM | POA: Diagnosis not present

## 2016-08-18 DIAGNOSIS — M9903 Segmental and somatic dysfunction of lumbar region: Secondary | ICD-10-CM | POA: Diagnosis not present

## 2016-08-18 DIAGNOSIS — M5134 Other intervertebral disc degeneration, thoracic region: Secondary | ICD-10-CM | POA: Diagnosis not present

## 2016-08-18 DIAGNOSIS — M9902 Segmental and somatic dysfunction of thoracic region: Secondary | ICD-10-CM | POA: Diagnosis not present

## 2016-08-18 DIAGNOSIS — M62830 Muscle spasm of back: Secondary | ICD-10-CM | POA: Diagnosis not present

## 2016-08-22 DIAGNOSIS — M9902 Segmental and somatic dysfunction of thoracic region: Secondary | ICD-10-CM | POA: Diagnosis not present

## 2016-08-22 DIAGNOSIS — M5134 Other intervertebral disc degeneration, thoracic region: Secondary | ICD-10-CM | POA: Diagnosis not present

## 2016-08-22 DIAGNOSIS — M9903 Segmental and somatic dysfunction of lumbar region: Secondary | ICD-10-CM | POA: Diagnosis not present

## 2016-08-22 DIAGNOSIS — M62830 Muscle spasm of back: Secondary | ICD-10-CM | POA: Diagnosis not present

## 2016-08-24 DIAGNOSIS — M9903 Segmental and somatic dysfunction of lumbar region: Secondary | ICD-10-CM | POA: Diagnosis not present

## 2016-08-24 DIAGNOSIS — M5134 Other intervertebral disc degeneration, thoracic region: Secondary | ICD-10-CM | POA: Diagnosis not present

## 2016-08-24 DIAGNOSIS — M62830 Muscle spasm of back: Secondary | ICD-10-CM | POA: Diagnosis not present

## 2016-08-24 DIAGNOSIS — M9902 Segmental and somatic dysfunction of thoracic region: Secondary | ICD-10-CM | POA: Diagnosis not present

## 2016-08-25 DIAGNOSIS — M62830 Muscle spasm of back: Secondary | ICD-10-CM | POA: Diagnosis not present

## 2016-08-25 DIAGNOSIS — M9903 Segmental and somatic dysfunction of lumbar region: Secondary | ICD-10-CM | POA: Diagnosis not present

## 2016-08-25 DIAGNOSIS — M5134 Other intervertebral disc degeneration, thoracic region: Secondary | ICD-10-CM | POA: Diagnosis not present

## 2016-08-25 DIAGNOSIS — M9902 Segmental and somatic dysfunction of thoracic region: Secondary | ICD-10-CM | POA: Diagnosis not present

## 2016-08-29 DIAGNOSIS — M9903 Segmental and somatic dysfunction of lumbar region: Secondary | ICD-10-CM | POA: Diagnosis not present

## 2016-08-29 DIAGNOSIS — M9902 Segmental and somatic dysfunction of thoracic region: Secondary | ICD-10-CM | POA: Diagnosis not present

## 2016-08-29 DIAGNOSIS — M5134 Other intervertebral disc degeneration, thoracic region: Secondary | ICD-10-CM | POA: Diagnosis not present

## 2016-08-29 DIAGNOSIS — M62830 Muscle spasm of back: Secondary | ICD-10-CM | POA: Diagnosis not present

## 2016-08-31 DIAGNOSIS — M9903 Segmental and somatic dysfunction of lumbar region: Secondary | ICD-10-CM | POA: Diagnosis not present

## 2016-08-31 DIAGNOSIS — M9902 Segmental and somatic dysfunction of thoracic region: Secondary | ICD-10-CM | POA: Diagnosis not present

## 2016-08-31 DIAGNOSIS — M5134 Other intervertebral disc degeneration, thoracic region: Secondary | ICD-10-CM | POA: Diagnosis not present

## 2016-08-31 DIAGNOSIS — M62830 Muscle spasm of back: Secondary | ICD-10-CM | POA: Diagnosis not present

## 2016-09-01 DIAGNOSIS — M9902 Segmental and somatic dysfunction of thoracic region: Secondary | ICD-10-CM | POA: Diagnosis not present

## 2016-09-01 DIAGNOSIS — M9903 Segmental and somatic dysfunction of lumbar region: Secondary | ICD-10-CM | POA: Diagnosis not present

## 2016-09-01 DIAGNOSIS — M62830 Muscle spasm of back: Secondary | ICD-10-CM | POA: Diagnosis not present

## 2016-09-01 DIAGNOSIS — M5134 Other intervertebral disc degeneration, thoracic region: Secondary | ICD-10-CM | POA: Diagnosis not present

## 2016-09-06 ENCOUNTER — Encounter: Payer: Self-pay | Admitting: Family Medicine

## 2016-09-06 ENCOUNTER — Ambulatory Visit (INDEPENDENT_AMBULATORY_CARE_PROVIDER_SITE_OTHER): Payer: PPO | Admitting: Family Medicine

## 2016-09-06 VITALS — BP 138/82 | HR 79 | Temp 98.1°F | Resp 16 | Ht 73.0 in | Wt 166.8 lb

## 2016-09-06 DIAGNOSIS — G8929 Other chronic pain: Secondary | ICD-10-CM | POA: Diagnosis not present

## 2016-09-06 DIAGNOSIS — M546 Pain in thoracic spine: Secondary | ICD-10-CM | POA: Diagnosis not present

## 2016-09-06 DIAGNOSIS — Z8546 Personal history of malignant neoplasm of prostate: Secondary | ICD-10-CM

## 2016-09-06 DIAGNOSIS — I10 Essential (primary) hypertension: Secondary | ICD-10-CM

## 2016-09-06 DIAGNOSIS — R7303 Prediabetes: Secondary | ICD-10-CM | POA: Diagnosis not present

## 2016-09-06 DIAGNOSIS — M150 Primary generalized (osteo)arthritis: Secondary | ICD-10-CM

## 2016-09-06 DIAGNOSIS — M9902 Segmental and somatic dysfunction of thoracic region: Secondary | ICD-10-CM | POA: Diagnosis not present

## 2016-09-06 DIAGNOSIS — M62830 Muscle spasm of back: Secondary | ICD-10-CM | POA: Diagnosis not present

## 2016-09-06 DIAGNOSIS — Z7689 Persons encountering health services in other specified circumstances: Secondary | ICD-10-CM | POA: Diagnosis not present

## 2016-09-06 DIAGNOSIS — E782 Mixed hyperlipidemia: Secondary | ICD-10-CM

## 2016-09-06 DIAGNOSIS — M9903 Segmental and somatic dysfunction of lumbar region: Secondary | ICD-10-CM | POA: Diagnosis not present

## 2016-09-06 DIAGNOSIS — M5134 Other intervertebral disc degeneration, thoracic region: Secondary | ICD-10-CM | POA: Diagnosis not present

## 2016-09-06 DIAGNOSIS — M159 Polyosteoarthritis, unspecified: Secondary | ICD-10-CM | POA: Insufficient documentation

## 2016-09-06 MED ORDER — BACLOFEN 10 MG PO TABS: 5.0000 mg | ORAL_TABLET | Freq: Three times a day (TID) | ORAL | 2 refills | Status: DC | PRN

## 2016-09-06 NOTE — Patient Instructions (Addendum)
Thank you for coming to the clinic today.  1.  Right mid back, Rib discomfort - continue with chiropractor plan, I am encouraged  Continue ice as needed. May try topical [place], Aspercreme may try this as needed for localized back pain.  Recommend to start taking Tylenol Arthritis Strength 650 tabs - take 2 tabs per dose (max 1300mg ) every 6-8 hours or 3 times daily for pain (take regularly, don't skip a dose for next 7 days), max 24 hour daily dose is 6 tablets or 3900. You can continue regular therapy for a long time, or if improved.  - Last resort you may try an Ibuprofen 400-600 or Aleve 200 to 500mg  per dose with food, may try each one twice a day as needed only for few days  Start taking Baclofen (Lioresal) 10mg  (muscle relaxant) - start with half (cut) to one whole pill at night as needed for next 1-3 nights (may make you drowsy, caution with driving) see how it affects you, then if tolerated increase to one pill 2 to 3 times a day or (every 8 hours as needed)  Consider DEXA Scan (Bone mineral density) screening for osteoporosis  Call the [facility] below anytime to schedule your own appointment now that order has been placed.  [facility] [place], [place] [address] Phone: [phone]  Your other x=-ray showed Osteopenia or thin bones, also with prior hip fracture, concern that you do have thin bones, and may be at risk for future fracture.  Think about other medications such as Fosamax (Alendronate) 70mg  weekly for strengthening  Start taking Vitamin D3 2,000 iu daily maintenance (OTC)  Please schedule a Follow-up Appointment to: Return in about 3 months (around 12/07/2016) for HTN, Back Pain OA, Pre-DM A1c.  If you have any other questions or concerns, please feel free to call the clinic or send a message through [facility]. You may also schedule an earlier appointment if necessary.  Additionally, you may  be receiving a survey about your experience at our clinic within a few days to 1 week by e-mail or mail. We value your feedback.  [name], DO [place]

## 2016-09-06 NOTE — Progress Notes (Signed)
Subjective:    Patient ID: [name], male    DOB: 09-25-1932, 83 y.o.   MRN: [id number]  [name] is a 83 y.o. male presenting on 09/06/2016 for Establish Care  Previously followed at [facility], saw Ronette Deter, then Dr Lacinda Axon, and he would like to switch to new location.  HPI   Specialists: Cardiology - Dr Ida Rogue Urology - Dr John Giovanni [facility] Urology > moving back to [place]), on testosterone replacement  History of s/p Prostate Cancer removal surgery - Approx 15 years ago. Followed by Urology now, post op, and on testosterone doing well  CAD s/p CABG x4 after prior 5 stents - Followed by Dr Rockey Situ Cardiology, previously followed by CTS Dr Prescott Gum - history of AFib, s/p ablation/blocked, now no longer has Afib and no anticoagulation or rate control - He is on Simvastatin, Zetia, ASA 81  Presumed Osteopenia vs Osteoporosis / history of Fall 01/2014, s/p Fractured R Hip  Mid to Low Back Pain, Subacute on Chronic / History of OA/DJD - Followed by Dr Iris Pert ([facility] in [place], R mid thoracic R rib) - Reports chronic intermittent mid R sided back pain, concern with a "knot" on back, was told related to rib, some improvement with therapy, seems to be posturally related, improved if able to stretch out and with PT treatments, now 1 month of chiropractor plans to keep going, has had x-rays before, agrees to update today - Ice pack 10-15 min PRN with relief. Heat made it worse. - Taking Tylenol Arthritis str 650mg  x 1 dose in AM only   Past Medical History:  Diagnosis Date  . Arthritis   . Coronary artery disease    a. 1993 s/p PCI/BMS LAD;  b. 2003 PCI RCA;  c. 09/2006 ISR LAD->PCI, PCI RCA;  d. 02/2007 MV: no ischemia;  d. 01/2012 MV: EF 74%, no ischemia.  [name] History of prostate cancer   . Hyperlipidemia   . Hyperthyroidism   . Low testosterone   . PAF (paroxysmal atrial fibrillation) ([place])    a. previously on amio  ->stopped in 2013;  b. recurrent PAF 01/2012->Pradaxa and amio initiated, subsequently came off of pradaxa 2/2 cost;  c. 02/2012 Echo: EF 55-60%, Gr 1 DD.  [name] Prostate cancer ([place])   . S/P CABG x 4    Past Surgical History:  Procedure Laterality Date  . APPENDECTOMY     Dr. Bary Castilla  . CARDIAC CATHETERIZATION  2008  . CLIPPING OF ATRIAL APPENDAGE Left 05/18/2012   Procedure: CLIPPING OF ATRIAL APPENDAGE;  Surgeon: [name], MD;  Location: [place];  Service: Open Heart Surgery;  Laterality: Left;  . COLONOSCOPY  2013  . COLONOSCOPY WITH PROPOFOL N/A 07/12/2016   Procedure: COLONOSCOPY WITH PROPOFOL;  Surgeon: [name], MD;  Location: [facility];  Service: Endoscopy;  Laterality: N/A;  . CORONARY ARTERY BYPASS GRAFT N/A 05/18/2012   Procedure: CORONARY ARTERY BYPASS GRAFTING (CABG);  Surgeon: [name], MD;  Location: [place];  Service: Open Heart Surgery;  Laterality: N/A;  Times 4 using left internal mammary artery and endoscopically harvested right saphenous vein  . HERNIA REPAIR     s/p mesh bilaterally, Dr. Bary Castilla  . INTRAOPERATIVE TRANSESOPHAGEAL ECHOCARDIOGRAM N/A 05/18/2012   Procedure: INTRAOPERATIVE TRANSESOPHAGEAL ECHOCARDIOGRAM;  Surgeon: [name], MD;  Location: [place];  Service: Open Heart Surgery;  Laterality: N/A;  . LEFT HEART CATHETERIZATION WITH CORONARY ANGIOGRAM Bilateral 05/16/2012   Procedure: LEFT HEART  CATHETERIZATION WITH CORONARY ANGIOGRAM;  Surgeon: Burnell Blanks, MD;  Location[facility];  Service: Cardiovascular;  Laterality: Bilateral;  . MAZE N/A 05/18/2012   Procedure: MAZE;  Surgeon: [name], MD;  Location: [place];  Service: Open Heart Surgery;  Laterality: N/A;  . MOHS SURGERY    . PROSTATECTOMY    . TOTAL HIP ARTHROPLASTY Right    Dr. [name]   Social History   Social History  . Marital status: Married    Spouse name: [name] Hucks  . Number of children: N/A  . Years of education: JD   Occupational History  . 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 Former Geographical information systems officer    Social History Main Topics  . Smoking status: Former Smoker    Types: Cigars    Quit date: 10/04/1988  . Smokeless tobacco: Former Systems developer    Types: [place] date: 09/04/1991  . Alcohol use No     Comment: occassionaly  . Drug use: No  . Sexual activity: Not Currently   Other Topics Concern  . Not on file   Social History Narrative   Lives in [place] with wife. He was born in [place] and went to [place] in [place]. Daughter in [place], 2 step-daughters. Retired from [employer].   Family History  Problem Relation Age of Onset  . Coronary artery disease Mother   . Heart disease Mother   . Coronary artery disease Sister   . Diabetes Sister   . Heart disease Sister 29       deceased  . Diabetes Father   . Heart disease Brother    Current Outpatient Prescriptions on File Prior to Visit  Medication Sig  . Acetaminophen (TYLENOL ARTHRITIS PAIN PO) Take by mouth as needed.  . ALOE VERA JUICE LIQD Take by mouth daily.  . APPLE CIDER VINEGAR PO Take by mouth daily.  [name] aspirin EC 81 MG tablet Take 81 mg by mouth every evening.  [name] ipratropium (ATROVENT) 0.06 % nasal spray Place 1 spray into both nostrils daily.  [name] latanoprost (XALATAN) 0.005 % ophthalmic solution   . phenylephrine (NEO-SYNEPHRINE) 1 % nasal spray Place 1 drop into both nostrils every morning.  . polyethylene glycol powder (GLYCOLAX/MIRALAX) powder TAKE 17 G BY MOUTH AS DIRECTED.  [name] simvastatin (ZOCOR) 40 MG tablet TAKE 1 TABLET (40 MG TOTAL) BY MOUTH AT BEDTIME.  [name] testosterone cypionate (DEPOTESTOTERONE CYPIONATE) 200 MG/ML injection Inject into the muscle every 14 (fourteen) days.  . Tetrahydrozoline HCl (EYE DROPS OP) Apply to eye every morning.  . ezetimibe (ZETIA) 10 MG tablet Take 1 tablet (10 mg total) by mouth daily.  [name] MAGNESIUM CITRATE PO Take by mouth daily.   No current facility-administered medications on file  prior to visit.     Review of Systems  Constitutional: Negative for activity change, appetite change, chills, diaphoresis, fatigue, fever and unexpected weight change.  HENT: Negative for congestion, hearing loss and sinus pressure.   Eyes: Negative for visual disturbance.  Respiratory: Negative for apnea, cough, chest tightness, shortness of breath and wheezing.   Cardiovascular: Negative for chest pain, palpitations and leg swelling.  Gastrointestinal: Negative for abdominal pain, anal bleeding, blood in stool, constipation, diarrhea, nausea and vomiting.  Endocrine: Negative for cold intolerance and polyuria.  Genitourinary: Negative for difficulty urinating, dysuria, frequency, hematuria, testicular pain and urgency.  Musculoskeletal: Positive for back pain (R mid back rib).  Negative for arthralgias and neck pain.  Skin: Negative for rash.  Allergic/Immunologic: Negative for environmental allergies.  Neurological: Negative for dizziness, weakness, light-headedness, numbness and headaches.  Hematological: Negative for adenopathy.  Psychiatric/Behavioral: Negative for behavioral problems, dysphoric mood and sleep disturbance. The patient is not nervous/anxious.    Per HPI unless specifically indicated above     Objective:    BP 138/82 (BP Location: Left Arm, Cuff Size: Normal)   Pulse 79   Temp 98.1 F (36.7 C) (Oral)   Resp 16   Ht 6\' 1"  (1.854 m)   Wt 166 lb 12.8 oz (75.7 kg)   BMI 22.01 kg/m   Wt Readings from Last 3 Encounters:  09/06/16 166 lb 12.8 oz (75.7 kg)  07/12/16 170 lb (77.1 kg)  06/14/16 167 lb 9.6 oz (76 kg)    Physical Exam  Constitutional: He is oriented to person, place, and time. He appears well-developed and well-nourished. No distress.  Well-appearing 83 year old male, comfortable, cooperative  HENT:  Head: Normocephalic and atraumatic.  Mouth/Throat: Oropharynx is clear and moist.  Eyes: Conjunctivae are normal. Right eye exhibits no discharge.  Left eye exhibits no discharge.  Neck: Normal range of motion. Neck supple. No thyromegaly present.  Cardiovascular: Normal rate, regular rhythm, normal heart sounds and intact distal pulses.   No murmur heard. Pulmonary/Chest: Effort normal and breath sounds normal. No respiratory distress. He has no wheezes. He has no rales.  Musculoskeletal: Normal range of motion. He exhibits no edema.  Localized R mid thoracic back pain over posterior rib angle, some muscle spasm and hypertonicity, otherwise back nontender over spinous process and other areas, range of motion mostly intact with flex/ext, rotate. Appears slightly inc thoracic kyphosis posture  Lymphadenopathy:    He has no cervical adenopathy.  Neurological: He is alert and oriented to person, place, and time.  Skin: Skin is warm and dry. No rash noted. He is not diaphoretic. No erythema.  Psychiatric: He has a normal mood and affect. His behavior is normal.  Well groomed, good eye contact, normal speech and thoughts  Nursing note and vitals reviewed.    I have personally reviewed the radiology report from 09/23/14 Thoracic Spine Xray.  CLINICAL DATA:  Right-sided mid back pain for 1 year.  EXAM: THORACIC SPINE 2 VIEWS  COMPARISON:  Chest x-ray dated 02/04/2014  FINDINGS: There is no evidence of thoracic spine fracture. There is exaggerated thoracic kyphosis centered in the upper to mid thoracic spine. Osteoarthritic changes and diffuse idiopathic skeletal hyperostosis of the thoracic spine are noted.  Median sternotomy wires, changes from prior CABG and left atrial appendage clamp are noted. The cardiac silhouette is normal. The thoracic aorta is torturous and contains atherosclerotic calcifications.  IMPRESSION: No evidence of thoracic spine fracture.  Exaggerated thoracic kyphosis, likely due to osteoarthritic changes.   Electronically Signed   By: Fidela Salisbury M.D.   On: 09/23/2014 14:55   Results  for orders placed or performed during the hospital encounter of 07/12/16  Surgical pathology  Result Value Ref Range   SURGICAL PATHOLOGY      Surgical Pathology CASE: [id number] PATIENT: Gwyndolyn Saxon Rzasa Surgical Pathology Report     SPECIMEN SUBMITTED: A. Colon polyp, ascending; cold bx  CLINICAL HISTORY: None provided  PRE-OPERATIVE DIAGNOSIS: Constipation K59.0  POST-OPERATIVE DIAGNOSIS: Colon polyp     DIAGNOSIS: A. COLON POLYP, ASCENDING; COLD BIOPSY: - TUBULAR ADENOMA. - NEGATIVE FOR HIGH-GRADE DYSPLASIA AND MALIGNANCY.   GROSS DESCRIPTION: A. Labeled: C BX ascending  colon polyp Tissue fragment(s): 1 Size: 0.3 cm Description: pink fragment  Entirely submitted in one cassette(s).  Final Diagnosis performed by Bryan Lemma, MD.  Electronically signed 07/14/2016 5:04:48PM    The electronic signature indicates that the named Attending Pathologist has evaluated the specimen  Technical component performed at [facility], [address] [place], [place] [address] Lab: [phone] Dir: Bray Vickerman. Evette Doffing, MD  Professional component performed at [facility], [facility], [place], [place], [place] [address] Lab: [phone] Dir: Dellia Nims. Rubinas, MD        Assessment & Plan:   Problem List Items Addressed This Visit    Primary osteoarthritis involving multiple joints    Suspected chronic OA/DJD multiple joints, mostly spine, thoracic and lumbar Check X-ray today, confirmed some OA/DJD Improved with conservative therapy  Plan: 1. Maximize Tylenol take 650mg  x 2 up to TID regularly 2. Also given Baclofen PRN muscle relaxant, caution sedation 3. Avoid chronic NSAIDs - stop aleve, concern with CAD 4. Follow-up - may need formal referral back to PT, otherwise continue chiro      Relevant Medications   baclofen (LIORESAL) 10 MG tablet   Pre-diabetes    Well-controlled Pre-DM with A1c 5.8 (  Plan:  1. Not on any therapy currently    2. Encourage improved lifestyle - low carb, low sugar diet, reduce portion size, continue improving regular exercise 3. Follow-up 3 months PreDM A1c      Relevant Orders   Hemoglobin A1c   Personal history of prostate cancer    Stable without complication Followed by Urology, on testosterone replacement, s/p surgical removal 15 yr ago      Hyperlipidemia    Controlled cholesterol on statin and lifestyle Last lipid panel 05/2016 per Cardiology LDL at goal < 100 ASCVD known CAD s/p CABG  Plan: 1. Continue current meds - Simvastatin 40mg  daily 2. Continue ASA 81mg  for secondary ASCVD risk reduction 3. Encourage improved lifestyle - low carb/cholesterol, reduce portion size, continue improving regular exercise 4. Follow-up 3 months, Cardiology      Chronic back pain - Primary   Relevant Medications   baclofen (LIORESAL) 10 MG tablet   Benign essential hypertension    Appropriate BP given age, however mildly elevated improve on re-check, still above goal with cardiac history - Home BP readings improved  Complication with CAD   Plan:  1. Continue current BP regimen 2. Encourage improved lifestyle - low sodium diet, regular exercise 3. Continue monitor BP outside office, bring readings to next visit, if persistently >140/90 or new symptoms notify office sooner 4. Follow-up 3 months      Relevant Orders   BASIC METABOLIC PANEL WITH GFR    Other Visit Diagnoses    Encounter to establish care with new doctor          Check BMET, A1c today - for chemistry Kidney fxn trend, A1c  Meds ordered this encounter  Medications  . baclofen (LIORESAL) 10 MG tablet    Sig: Take 0.5-1 tablets (5-10 mg total) by mouth 3 (three) times daily as needed for muscle spasms.    Dispense:  30 each    Refill:  2    Follow up plan: Return in about 3 months (around 12/07/2016) for HTN, Back Pain OA, Pre-DM A1c.  [name], DO [facility] 09/07/2016, 12:22 AM

## 2016-09-07 ENCOUNTER — Encounter: Payer: Self-pay | Admitting: Family Medicine

## 2016-09-07 LAB — HEMOGLOBIN A1C
HEMOGLOBIN A1C: 5.6 % (ref <5.7)
MEAN PLASMA GLUCOSE: 114 mg/dL

## 2016-09-07 LAB — BASIC METABOLIC PANEL WITH GFR
BUN: 16 mg/dL (ref 7–25)
CHLORIDE: 104 mmol/L (ref 98–110)
CO2: 21 mmol/L (ref 20–32)
Calcium: 9.4 mg/dL (ref 8.6–10.3)
Creat: 1.01 mg/dL (ref 0.70–1.11)
GFR, EST NON AFRICAN AMERICAN: 69 mL/min (ref >=60)
GFR, Est African American: 80 mL/min (ref >=60)
Glucose, Bld: 106 mg/dL — ABNORMAL HIGH (ref 65–99)
POTASSIUM: 4.6 mmol/L (ref 3.5–5.3)
Sodium: 139 mmol/L (ref 135–146)

## 2016-09-07 NOTE — Assessment & Plan Note (Signed)
Stable without complication Followed by Urology, on testosterone replacement, s/p surgical removal 15 yr ago

## 2016-09-07 NOTE — Assessment & Plan Note (Signed)
Suspected chronic OA/DJD multiple joints, mostly spine, thoracic and lumbar Check X-ray today, confirmed some OA/DJD Improved with conservative therapy  Plan: 1. Maximize Tylenol take 650mg  x 2 up to TID regularly 2. Also given Baclofen PRN muscle relaxant, caution sedation 3. Avoid chronic NSAIDs - stop aleve, concern with CAD 4. Follow-up - may need formal referral back to PT, otherwise continue chiro

## 2016-09-07 NOTE — Assessment & Plan Note (Signed)
Controlled cholesterol on statin and lifestyle Last lipid panel 05/2016 per Cardiology LDL at goal < 100 ASCVD known CAD s/p CABG  Plan: 1. Continue current meds - Simvastatin 40mg  daily 2. Continue ASA 81mg  for secondary ASCVD risk reduction 3. Encourage improved lifestyle - low carb/cholesterol, reduce portion size, continue improving regular exercise 4. Follow-up 3 months, Cardiology

## 2016-09-07 NOTE — Assessment & Plan Note (Signed)
Well-controlled Pre-DM with A1c 5.8 (  Plan:  1. Not on any therapy currently  2. Encourage improved lifestyle - low carb, low sugar diet, reduce portion size, continue improving regular exercise 3. Follow-up 3 months PreDM A1c

## 2016-09-07 NOTE — Assessment & Plan Note (Signed)
Appropriate BP given age, however mildly elevated improve on re-check, still above goal with cardiac history - Home BP readings improved  Complication with CAD   Plan:  1. Continue current BP regimen 2. Encourage improved lifestyle - low sodium diet, regular exercise 3. Continue monitor BP outside office, bring readings to next visit, if persistently >140/90 or new symptoms notify office sooner 4. Follow-up 3 months

## 2016-09-08 DIAGNOSIS — M5134 Other intervertebral disc degeneration, thoracic region: Secondary | ICD-10-CM | POA: Diagnosis not present

## 2016-09-08 DIAGNOSIS — M9903 Segmental and somatic dysfunction of lumbar region: Secondary | ICD-10-CM | POA: Diagnosis not present

## 2016-09-08 DIAGNOSIS — M9902 Segmental and somatic dysfunction of thoracic region: Secondary | ICD-10-CM | POA: Diagnosis not present

## 2016-09-08 DIAGNOSIS — M62830 Muscle spasm of back: Secondary | ICD-10-CM | POA: Diagnosis not present

## 2016-09-13 DIAGNOSIS — M9902 Segmental and somatic dysfunction of thoracic region: Secondary | ICD-10-CM | POA: Diagnosis not present

## 2016-09-13 DIAGNOSIS — M62830 Muscle spasm of back: Secondary | ICD-10-CM | POA: Diagnosis not present

## 2016-09-13 DIAGNOSIS — M5134 Other intervertebral disc degeneration, thoracic region: Secondary | ICD-10-CM | POA: Diagnosis not present

## 2016-09-13 DIAGNOSIS — M9903 Segmental and somatic dysfunction of lumbar region: Secondary | ICD-10-CM | POA: Diagnosis not present

## 2016-09-15 DIAGNOSIS — M9903 Segmental and somatic dysfunction of lumbar region: Secondary | ICD-10-CM | POA: Diagnosis not present

## 2016-09-15 DIAGNOSIS — M5134 Other intervertebral disc degeneration, thoracic region: Secondary | ICD-10-CM | POA: Diagnosis not present

## 2016-09-15 DIAGNOSIS — M9902 Segmental and somatic dysfunction of thoracic region: Secondary | ICD-10-CM | POA: Diagnosis not present

## 2016-09-15 DIAGNOSIS — M62830 Muscle spasm of back: Secondary | ICD-10-CM | POA: Diagnosis not present

## 2016-09-19 ENCOUNTER — Ambulatory Visit (INDEPENDENT_AMBULATORY_CARE_PROVIDER_SITE_OTHER): Payer: PPO

## 2016-09-19 DIAGNOSIS — Z23 Encounter for immunization: Secondary | ICD-10-CM

## 2016-09-20 DIAGNOSIS — M9903 Segmental and somatic dysfunction of lumbar region: Secondary | ICD-10-CM | POA: Diagnosis not present

## 2016-09-20 DIAGNOSIS — M5134 Other intervertebral disc degeneration, thoracic region: Secondary | ICD-10-CM | POA: Diagnosis not present

## 2016-09-20 DIAGNOSIS — M62830 Muscle spasm of back: Secondary | ICD-10-CM | POA: Diagnosis not present

## 2016-09-20 DIAGNOSIS — M9902 Segmental and somatic dysfunction of thoracic region: Secondary | ICD-10-CM | POA: Diagnosis not present

## 2016-09-22 DIAGNOSIS — M9902 Segmental and somatic dysfunction of thoracic region: Secondary | ICD-10-CM | POA: Diagnosis not present

## 2016-09-22 DIAGNOSIS — M62830 Muscle spasm of back: Secondary | ICD-10-CM | POA: Diagnosis not present

## 2016-09-22 DIAGNOSIS — M9903 Segmental and somatic dysfunction of lumbar region: Secondary | ICD-10-CM | POA: Diagnosis not present

## 2016-09-22 DIAGNOSIS — M5134 Other intervertebral disc degeneration, thoracic region: Secondary | ICD-10-CM | POA: Diagnosis not present

## 2016-09-27 DIAGNOSIS — M9903 Segmental and somatic dysfunction of lumbar region: Secondary | ICD-10-CM | POA: Diagnosis not present

## 2016-09-27 DIAGNOSIS — M62830 Muscle spasm of back: Secondary | ICD-10-CM | POA: Diagnosis not present

## 2016-09-27 DIAGNOSIS — M5134 Other intervertebral disc degeneration, thoracic region: Secondary | ICD-10-CM | POA: Diagnosis not present

## 2016-09-27 DIAGNOSIS — M9902 Segmental and somatic dysfunction of thoracic region: Secondary | ICD-10-CM | POA: Diagnosis not present

## 2016-09-29 DIAGNOSIS — M62830 Muscle spasm of back: Secondary | ICD-10-CM | POA: Diagnosis not present

## 2016-09-29 DIAGNOSIS — M9902 Segmental and somatic dysfunction of thoracic region: Secondary | ICD-10-CM | POA: Diagnosis not present

## 2016-09-29 DIAGNOSIS — M5134 Other intervertebral disc degeneration, thoracic region: Secondary | ICD-10-CM | POA: Diagnosis not present

## 2016-09-29 DIAGNOSIS — M9903 Segmental and somatic dysfunction of lumbar region: Secondary | ICD-10-CM | POA: Diagnosis not present

## 2016-10-04 DIAGNOSIS — M9902 Segmental and somatic dysfunction of thoracic region: Secondary | ICD-10-CM | POA: Diagnosis not present

## 2016-10-04 DIAGNOSIS — M9903 Segmental and somatic dysfunction of lumbar region: Secondary | ICD-10-CM | POA: Diagnosis not present

## 2016-10-04 DIAGNOSIS — M62830 Muscle spasm of back: Secondary | ICD-10-CM | POA: Diagnosis not present

## 2016-10-04 DIAGNOSIS — M5134 Other intervertebral disc degeneration, thoracic region: Secondary | ICD-10-CM | POA: Diagnosis not present

## 2016-10-06 DIAGNOSIS — M5134 Other intervertebral disc degeneration, thoracic region: Secondary | ICD-10-CM | POA: Diagnosis not present

## 2016-10-06 DIAGNOSIS — M62830 Muscle spasm of back: Secondary | ICD-10-CM | POA: Diagnosis not present

## 2016-10-06 DIAGNOSIS — M9902 Segmental and somatic dysfunction of thoracic region: Secondary | ICD-10-CM | POA: Diagnosis not present

## 2016-10-06 DIAGNOSIS — M9903 Segmental and somatic dysfunction of lumbar region: Secondary | ICD-10-CM | POA: Diagnosis not present

## 2016-10-12 DIAGNOSIS — M9903 Segmental and somatic dysfunction of lumbar region: Secondary | ICD-10-CM | POA: Diagnosis not present

## 2016-10-12 DIAGNOSIS — M5134 Other intervertebral disc degeneration, thoracic region: Secondary | ICD-10-CM | POA: Diagnosis not present

## 2016-10-12 DIAGNOSIS — M62830 Muscle spasm of back: Secondary | ICD-10-CM | POA: Diagnosis not present

## 2016-10-12 DIAGNOSIS — M9902 Segmental and somatic dysfunction of thoracic region: Secondary | ICD-10-CM | POA: Diagnosis not present

## 2016-10-18 DIAGNOSIS — H40153 Residual stage of open-angle glaucoma, bilateral: Secondary | ICD-10-CM | POA: Diagnosis not present

## 2016-10-31 ENCOUNTER — Telehealth: Payer: Self-pay | Admitting: Urology

## 2016-10-31 NOTE — Telephone Encounter (Signed)
Patient walked into clinic today asking for a refill on his testosterone medication. He saw Dr. Bernardo Heater at [facility] and says that he needs another one called into the [facility] on [address] We have not seen him in our clinic yet. He wants a return call at [phone] I have the refill information and will give it to [facility].  Sharyn Lull

## 2016-11-01 NOTE — Telephone Encounter (Signed)
He is due for a six-month follow-up in November so would recommend an appointment.

## 2016-11-01 NOTE — Telephone Encounter (Signed)
Do you want to see pt first or just give refills?

## 2016-11-02 NOTE — Telephone Encounter (Signed)
Spoke with pt in reference needing a f/u prior to testosterone refills. Pt voiced understanding. Appt made.

## 2016-11-03 ENCOUNTER — Encounter: Payer: Self-pay | Admitting: Urology

## 2016-11-03 ENCOUNTER — Ambulatory Visit: Payer: PPO | Admitting: Urology

## 2016-11-03 VITALS — BP 160/65 | HR 76 | Ht 73.0 in | Wt 170.0 lb

## 2016-11-03 DIAGNOSIS — Z8546 Personal history of malignant neoplasm of prostate: Secondary | ICD-10-CM

## 2016-11-03 DIAGNOSIS — E291 Testicular hypofunction: Secondary | ICD-10-CM | POA: Diagnosis not present

## 2016-11-03 MED ORDER — TESTOSTERONE CYPIONATE 200 MG/ML IM SOLN: 60.0000 mg | INTRAMUSCULAR | 0 refills | Status: DC

## 2016-11-03 NOTE — Progress Notes (Signed)
11/03/2016 3:22 PM   [name] [birth year] [id number]  Referring provider: [name], DO [address] [place], [place] [address]  Chief Complaint  Patient presents with  . Hypogonadism    38month follow up    HPI: 83 yo male presents for follow-up of hypogonadism.  He has been on adjuvant replacement since 2013.  His primary symptoms were excessive sweating and flushing with resolution on androgen replacement.  He is injecting 60 mg testosterone cypionate every 2 weeks.  He denies breast tenderness/enlargement or lower extremity edema.  He underwent radical prostatectomy in 2005 and PSAs have remained undetectable since his surgery.    PMH: Past Medical History:  Diagnosis Date  . Arthritis   . Coronary artery disease    a. 1993 s/p PCI/BMS LAD;  b. 2003 PCI RCA;  c. 09/2006 ISR LAD->PCI, PCI RCA;  d. 02/2007 MV: no ischemia;  d. 01/2012 MV: EF 74%, no ischemia.  [name] History of prostate cancer   . Hyperlipidemia   . Hyperthyroidism   . Low testosterone   . PAF (paroxysmal atrial fibrillation) ([place])    a. previously on amio ->stopped in 2013;  b. recurrent PAF 01/2012->Pradaxa and amio initiated, subsequently came off of pradaxa 2/2 cost;  c. 02/2012 Echo: EF 55-60%, Gr 1 DD.  [name] Prostate cancer ([place])   . S/P CABG x 4     Surgical History: Past Surgical History:  Procedure Laterality Date  . APPENDECTOMY     Dr. Bary Castilla  . CARDIAC CATHETERIZATION  2008  . CLIPPING OF ATRIAL APPENDAGE Left 05/18/2012   Procedure: CLIPPING OF ATRIAL APPENDAGE;  Surgeon: [name], MD;  Location: [facility];  Service: Open Heart Surgery;  Laterality: Left;  . COLONOSCOPY  2013  . COLONOSCOPY WITH PROPOFOL N/A 07/12/2016   Procedure: COLONOSCOPY WITH PROPOFOL;  Surgeon: [name], MD;  Location: [facility] ENDOSCOPY;  Service: Endoscopy;  Laterality: N/A;  . CORONARY ARTERY BYPASS GRAFT N/A 05/18/2012   Procedure: CORONARY ARTERY BYPASS GRAFTING (CABG);  Surgeon: [name], MD;   Location: [facility];  Service: Open Heart Surgery;  Laterality: N/A;  Times 4 using left internal mammary artery and endoscopically harvested right saphenous vein  . HERNIA REPAIR     s/p mesh bilaterally, Dr. Bary Castilla  . INTRAOPERATIVE TRANSESOPHAGEAL ECHOCARDIOGRAM N/A 05/18/2012   Procedure: INTRAOPERATIVE TRANSESOPHAGEAL ECHOCARDIOGRAM;  Surgeon: [name], MD;  Location: [facility];  Service: Open Heart Surgery;  Laterality: N/A;  . LEFT HEART CATHETERIZATION WITH CORONARY ANGIOGRAM Bilateral 05/16/2012   Procedure: LEFT HEART CATHETERIZATION WITH CORONARY ANGIOGRAM;  Surgeon: Burnell Blanks, MD;  Location: [facility] CATH LAB;  Service: Cardiovascular;  Laterality: Bilateral;  . MAZE N/A 05/18/2012   Procedure: MAZE;  Surgeon: [name], MD;  Location: [facility];  Service: Open Heart Surgery;  Laterality: N/A;  . MOHS SURGERY    . PROSTATECTOMY    . TOTAL HIP ARTHROPLASTY Right    Dr. [name]    Home Medications:  Allergies as of 11/03/2016      Reactions   Ambien [zolpidem Tartrate] Other (See Comments)   Super sensitive.    Statins    REACTION: muscle aches...crestor   Zolpidem Tartrate       Medication List       Accurate as of 11/03/16  3:22 PM. Always use your most recent med list.          aspirin EC 81 MG tablet Take 81 mg by mouth every evening.  baclofen 10 MG tablet Commonly known as:  LIORESAL Take 0.5-1 tablets (5-10 mg total) by mouth 3 (three) times daily as needed for muscle spasms.   EYE DROPS OP Apply to eye every morning.   ezetimibe 10 MG tablet Commonly known as:  ZETIA Take 1 tablet (10 mg total) by mouth daily.   ipratropium 0.06 % nasal spray Commonly known as:  ATROVENT Place 1 spray into both nostrils daily.   latanoprost 0.005 % ophthalmic solution Commonly known as:  XALATAN   MAGNESIUM CITRATE PO Take by mouth daily.   simvastatin 40 MG tablet Commonly known as:  ZOCOR TAKE 1 TABLET (40 MG TOTAL) BY MOUTH AT BEDTIME.   testosterone  cypionate 200 MG/ML injection Commonly known as:  DEPOTESTOSTERONE CYPIONATE Inject 0.3 mLs (60 mg total) into the muscle every 14 (fourteen) days.   TYLENOL ARTHRITIS PAIN PO Take by mouth as needed.       Allergies:  Allergies  Allergen Reactions  . Ambien [Zolpidem Tartrate] Other (See Comments)    Super sensitive.   . Statins     REACTION: muscle aches...crestor  . Zolpidem Tartrate     Family History: Family History  Problem Relation Age of Onset  . Coronary artery disease Mother   . Heart disease Mother   . Coronary artery disease Sister   . Diabetes Sister   . Heart disease Sister 32       deceased  . Diabetes Father   . Heart disease Brother     Social History:  reports that he quit smoking about 28 years ago. His smoking use included Cigars. He quit smokeless tobacco use about 25 years ago. His smokeless tobacco use included Chew. He reports that he does not drink alcohol or use drugs.  ROS: UROLOGY Frequent Urination?: No Hard to postpone urination?: No Burning/pain with urination?: No Get up at night to urinate?: No Leakage of urine?: No Urine stream starts and stops?: No Trouble starting stream?: No Do you have to strain to urinate?: No Blood in urine?: No Urinary tract infection?: No Sexually transmitted disease?: No Injury to kidneys or bladder?: No Painful intercourse?: No Weak stream?: No Erection problems?: No Penile pain?: No  Gastrointestinal Nausea?: No Vomiting?: No Indigestion/heartburn?: No Diarrhea?: No Constipation?: No  Constitutional Fever: No Night sweats?: No Weight loss?: No Fatigue?: No  Skin Skin rash/lesions?: No Itching?: No  Eyes Blurred vision?: No Double vision?: No  Ears/Nose/Throat Sore throat?: No Sinus problems?: No  Hematologic/Lymphatic Swollen glands?: No Easy bruising?: No  Cardiovascular Leg swelling?: No Chest pain?: No  Respiratory Cough?: No Shortness of breath?:  No  Endocrine Excessive thirst?: No  Musculoskeletal Back pain?: Yes Joint pain?: No  Neurological Headaches?: No Dizziness?: No  Psychologic Depression?: No Anxiety?: No  Physical Exam: BP (!) 160/65   Pulse 76   Ht 6\' 1"  (1.854 m)   Wt 170 lb (77.1 kg)   BMI 22.43 kg/m   Constitutional:  Alert and oriented, No acute distress. HEENT: [place] AT, moist mucus membranes.  Trachea midline, no masses. Cardiovascular: No clubbing, cyanosis, or edema. Respiratory: Normal respiratory effort, no increased work of breathing. GI: Abdomen is soft, nontender, nondistended, no abdominal masses GU: No CVA tenderness.  Skin: No rashes, bruises or suspicious lesions. Lymph: No cervical or inguinal adenopathy. Neurologic: Grossly intact, no focal deficits, moving all 4 extremities. Psychiatric: Normal mood and affect.  Laboratory Data: Lab Results  Component Value Date   WBC 6.7 04/14/2015   HGB 14.6  04/14/2015   HCT 43.7 04/14/2015   MCV 83.9 04/14/2015   PLT 218.0 04/14/2015    Lab Results  Component Value Date   CREATININE 1.01 09/06/2016    No results found for: PSA1  Lab Results  Component Value Date   TESTOSTERONE 881 08/12/2013    Lab Results  Component Value Date   HGBA1C 5.6 09/06/2016    Urinalysis Lab Results  Component Value Date   APPEARANCEUR Clear 09/22/2013   LEUKOCYTESUR Trace 09/22/2013   PROTEINUR Negative 09/22/2013   GLUCOSEU 50 mg/dL 09/22/2013   RBCU NONE SEEN 09/22/2013   BILIRUBINUR Negative 09/22/2013   NITRITE Negative 09/22/2013    Lab Results  Component Value Date   BACTERIA NONE SEEN 09/22/2013     Assessment & Plan:    1. Testicular hypofunction Testosterone was refilled.  Testosterone and hematocrit were drawn today and will be notified with results.  If stable he will follow-up in 6 months for blood work in 1 year for office visit.  - Testosterone - Hematocrit - Testosterone; Future - Hematocrit; Future  2. Personal  history of malignant neoplasm of prostate  - PSA; Future   Return in about 1 year (around 11/03/2017) for [place].    Abbie Sons, [place] [address] [place], [place] [address] [phone]

## 2016-11-04 LAB — TESTOSTERONE: TESTOSTERONE: 756 ng/dL (ref 264–916)

## 2016-11-04 LAB — HEMATOCRIT: Hematocrit: 43.4 % (ref 37.5–51.0)

## 2016-11-09 ENCOUNTER — Other Ambulatory Visit: Payer: Self-pay | Admitting: Family Medicine

## 2016-11-09 DIAGNOSIS — G8929 Other chronic pain: Secondary | ICD-10-CM

## 2016-11-09 DIAGNOSIS — M546 Pain in thoracic spine: Principal | ICD-10-CM

## 2016-11-14 ENCOUNTER — Ambulatory Visit: Payer: PPO

## 2016-11-14 VITALS — BP 149/84 | HR 78 | Ht 73.0 in | Wt 170.0 lb

## 2016-11-14 DIAGNOSIS — E291 Testicular hypofunction: Secondary | ICD-10-CM

## 2016-11-14 NOTE — Progress Notes (Signed)
Pt presented today to have testosterone injection teaching per Dr. Bernardo Heater. Pt was instructed the proper way to draw up medication and administer. Pt tolerated well. Pt voiced understanding.   Blood pressure (!) 149/84, pulse 78, height 6\' 1"  (1.854 m), weight 170 lb (77.1 kg).

## 2016-11-23 ENCOUNTER — Ambulatory Visit: Payer: Self-pay

## 2016-11-23 ENCOUNTER — Telehealth: Payer: Self-pay | Admitting: Family Medicine

## 2016-11-23 NOTE — Telephone Encounter (Signed)
Called pt to sched for AWV with Nurse Health Advisor. Last AWV on 11/24/15 please schedule AWV with NHA on 11/27 move f/up w/Dr. Raliegh Ip to follow that appt. C/b #  [phone] on Skype [email or web address] if you have questions

## 2016-11-28 ENCOUNTER — Ambulatory Visit: Payer: PPO

## 2016-11-28 DIAGNOSIS — E291 Testicular hypofunction: Secondary | ICD-10-CM

## 2016-11-28 NOTE — Progress Notes (Signed)
Pt presented today stating he wanted a nurse watch him inject testosterone. Pt performed task. All questions answered. Pt voiced understanding.

## 2016-12-06 ENCOUNTER — Ambulatory Visit (INDEPENDENT_AMBULATORY_CARE_PROVIDER_SITE_OTHER): Payer: PPO

## 2016-12-06 ENCOUNTER — Encounter: Payer: Self-pay | Admitting: Family Medicine

## 2016-12-06 ENCOUNTER — Ambulatory Visit (INDEPENDENT_AMBULATORY_CARE_PROVIDER_SITE_OTHER): Payer: PPO | Admitting: Family Medicine

## 2016-12-06 VITALS — BP 160/78 | HR 58 | Temp 97.9°F | Resp 17 | Ht 73.0 in | Wt 166.8 lb

## 2016-12-06 VITALS — BP 130/80 | HR 58 | Temp 97.9°F | Resp 16 | Ht 73.0 in | Wt 166.8 lb

## 2016-12-06 DIAGNOSIS — I2510 Atherosclerotic heart disease of native coronary artery without angina pectoris: Secondary | ICD-10-CM | POA: Diagnosis not present

## 2016-12-06 DIAGNOSIS — M546 Pain in thoracic spine: Secondary | ICD-10-CM

## 2016-12-06 DIAGNOSIS — G8929 Other chronic pain: Secondary | ICD-10-CM | POA: Diagnosis not present

## 2016-12-06 DIAGNOSIS — E782 Mixed hyperlipidemia: Secondary | ICD-10-CM

## 2016-12-06 DIAGNOSIS — Z0000 Encounter for general adult medical examination without abnormal findings: Secondary | ICD-10-CM

## 2016-12-06 DIAGNOSIS — I714 Abdominal aortic aneurysm, without rupture, unspecified: Secondary | ICD-10-CM

## 2016-12-06 DIAGNOSIS — R7303 Prediabetes: Secondary | ICD-10-CM | POA: Diagnosis not present

## 2016-12-06 MED ORDER — BACLOFEN 10 MG PO TABS: 10.0000 mg | ORAL_TABLET | Freq: Three times a day (TID) | ORAL | 5 refills | Status: DC | PRN

## 2016-12-06 NOTE — Progress Notes (Signed)
Subjective:   [name] is a 83 y.o. male who presents for Medicare Annual/Subsequent preventive examination.  Review of Systems:   Cardiac Risk Factors include: male gender;advanced age (>2men, >25 women);hypertension;dyslipidemia     Objective:    Vitals: BP (!) 160/78 (BP Location: Right Arm, Patient Position: Sitting)   Pulse (!) 58   Temp 97.9 F (36.6 C) (Oral)   Resp 17   Ht 6\' 1"  (1.854 m)   Wt 166 lb 12.8 oz (75.7 kg)   BMI 22.01 kg/m   Body mass index is 22.01 kg/m.  Tobacco Social History   Tobacco Use  Smoking Status Former Smoker  . Types: Cigars  . Last attempt to quit: 10/04/1988  . Years since quitting: 28.1  Smokeless Tobacco Former Systems developer  . Types: Chew  . Quit date: 09/04/1991     Counseling given: No   Past Medical History:  Diagnosis Date  . Arthritis   . Coronary artery disease    a. 1993 s/p PCI/BMS LAD;  b. 2003 PCI RCA;  c. 09/2006 ISR LAD->PCI, PCI RCA;  d. 02/2007 MV: no ischemia;  d. 01/2012 MV: EF 74%, no ischemia.  [name] History of prostate cancer   . Hyperlipidemia   . Hyperthyroidism   . Low testosterone   . PAF (paroxysmal atrial fibrillation) ([place])    a. previously on amio ->stopped in 2013;  b. recurrent PAF 01/2012->Pradaxa and amio initiated, subsequently came off of pradaxa 2/2 cost;  c. 02/2012 Echo: EF 55-60%, Gr 1 DD.  [name] Prostate cancer ([place])   . S/P CABG x 4    Past Surgical History:  Procedure Laterality Date  . APPENDECTOMY     Dr. Bary Castilla  . CARDIAC CATHETERIZATION  2008  . CLIPPING OF ATRIAL APPENDAGE Left 05/18/2012   Procedure: CLIPPING OF ATRIAL APPENDAGE;  Surgeon: [name], MD;  Location: [facility];  Service: Open Heart Surgery;  Laterality: Left;  . COLONOSCOPY  2013  . COLONOSCOPY WITH PROPOFOL N/A 07/12/2016   Procedure: COLONOSCOPY WITH PROPOFOL;  Surgeon: [name], MD;  Location: [facility] ENDOSCOPY;  Service: Endoscopy;  Laterality: N/A;  . CORONARY ARTERY BYPASS GRAFT N/A 05/18/2012   Procedure: CORONARY  ARTERY BYPASS GRAFTING (CABG);  Surgeon: [name], MD;  Location: [facility];  Service: Open Heart Surgery;  Laterality: N/A;  Times 4 using left internal mammary artery and endoscopically harvested right saphenous vein  . HERNIA REPAIR     s/p mesh bilaterally, Dr. Bary Castilla  . INTRAOPERATIVE TRANSESOPHAGEAL ECHOCARDIOGRAM N/A 05/18/2012   Procedure: INTRAOPERATIVE TRANSESOPHAGEAL ECHOCARDIOGRAM;  Surgeon: [name], MD;  Location: [facility];  Service: Open Heart Surgery;  Laterality: N/A;  . LEFT HEART CATHETERIZATION WITH CORONARY ANGIOGRAM Bilateral 05/16/2012   Procedure: LEFT HEART CATHETERIZATION WITH CORONARY ANGIOGRAM;  Surgeon: Burnell Blanks, MD;  Location: [facility] CATH LAB;  Service: Cardiovascular;  Laterality: Bilateral;  . MAZE N/A 05/18/2012   Procedure: MAZE;  Surgeon: [name], MD;  Location: [facility];  Service: Open Heart Surgery;  Laterality: N/A;  . MOHS SURGERY    . PROSTATECTOMY    . TOTAL HIP ARTHROPLASTY Right    Dr. [name]   Family History  Problem Relation Age of Onset  . Coronary artery disease Mother   . Heart disease Mother   . Coronary artery disease Sister   . Diabetes Sister   . Heart disease Sister 70       deceased  . Diabetes Father   . Heart  disease Brother    Social History   Substance and Sexual Activity  Sexual Activity Not Currently    Outpatient Encounter Medications as of 12/06/2016  Medication Sig  . aspirin EC 81 MG tablet Take 81 mg by mouth every evening.  . baclofen (LIORESAL) 10 MG tablet TAKE 0.5-1 TABLETS BY MOUTH THREE TIMES A DAY AS NEEDED FOR MUSCLE SPASMS  . ezetimibe (ZETIA) 10 MG tablet Take 1 tablet (10 mg total) by mouth daily.  [name] ipratropium (ATROVENT) 0.06 % nasal spray Place 1 spray into both nostrils daily.  [name] latanoprost (XALATAN) 0.005 % ophthalmic solution   . naproxen sodium (ALEVE) 220 MG tablet Take 220 mg by mouth daily as needed.  . testosterone cypionate (DEPOTESTOSTERONE CYPIONATE) 200 MG/ML injection  Inject 0.3 mLs (60 mg total) into the muscle every 14 (fourteen) days.  . Acetaminophen (TYLENOL ARTHRITIS PAIN PO) Take by mouth as needed.  [name] MAGNESIUM CITRATE PO Take by mouth daily.  . simvastatin (ZOCOR) 40 MG tablet TAKE 1 TABLET (40 MG TOTAL) BY MOUTH AT BEDTIME. (Patient not taking: Reported on 12/06/2016)  . [DISCONTINUED] Tetrahydrozoline HCl (EYE DROPS OP) Apply to eye every morning.   No facility-administered encounter medications on file as of 12/06/2016.     Activities of Daily Living In your present state of health, do you have any difficulty performing the following activities: 12/06/2016  Hearing? Y  Comment some difficulty   Vision? Y  Difficulty concentrating or making decisions? Y  Walking or climbing stairs? N  Dressing or bathing? N  Doing errands, shopping? N  Preparing Food and eating ? N  Using the Toilet? N  In the past six months, have you accidently leaked urine? Y  Comment pads  Do you have problems with loss of bowel control? Y  Comment occasional due to irregularity  Managing your Medications? N  Managing your Finances? N  Housekeeping or managing your Housekeeping? N  Some recent data might be hidden    Patient Care Team: [name], DO as PCP - General (Family Medicine) Minna Merritts, MD as Consulting Physician (Cardiology)   Assessment:     Exercise Activities and Dietary recommendations Current Exercise Habits: The patient does not participate in regular exercise at present, Exercise limited by: None identified  Goals    . DIET - INCREASE WATER INTAKE     Recommend drinking at least 6-8 glasses of water a day      Fall Risk Fall Risk  12/06/2016 11/24/2015 05/28/2015 11/28/2014 09/23/2014  Falls in the past year? No No No Yes Yes  Number falls in past yr: - - - 1 1  Injury with Fall? - - - Yes Yes  Follow up - - - Education provided;Falls prevention discussed -   Depression Screen PHQ 2/9 Scores 12/06/2016  11/24/2015 05/28/2015 11/28/2014  PHQ - 2 Score 0 0 0 0    Cognitive Function MMSE - Mini Mental State Exam 11/24/2015 11/28/2014  Orientation to time 5 5  Orientation to Place 5 5  Registration 3 3  Attention/ Calculation 5 5  Recall 3 3  Language- name 2 objects 2 2  Language- repeat 1 1  Language- follow 3 step command 3 3  Language- read & follow direction 1 1  Write a sentence 1 1  Copy design 1 1  Total score 30 30     6CIT Screen 12/06/2016  What Year? 0 points  What month? 0 points  What time? 0 points  Count back from 20 0 points  Months in reverse 0 points  Repeat phrase 0 points  Total Score 0    Immunization History  Administered Date(s) Administered  . Influenza Split 10/05/2010, 10/27/2011, 10/10/2013  . Influenza, High Dose Seasonal PF 09/23/2015, 09/19/2016  . Influenza,inj,Quad PF,6+ Mos 09/18/2012, 11/04/2014  . Pneumococcal Conjugate-13 02/19/2013  . Pneumococcal Polysaccharide-23 11/15/2007   Screening Tests Health Maintenance  Topic Date Due  . TETANUS/TDAP  12/06/2017 (Originally 12/01/1951)  . INFLUENZA VACCINE  Completed  . PNA vac Low Risk Adult  Completed      Plan:    I have personally reviewed and addressed the Medicare Annual Wellness questionnaire and have noted the following in the patient's chart:  A. Medical and social history B. Use of alcohol, tobacco or illicit drugs  C. Current medications and supplements D. Functional ability and status E.  Nutritional status   F.  Physical activity G. Advance directives H. List of other physicians I.  Hospitalizations, surgeries, and ER visits in previous 12 months J.  [place] such as hearing and vision if needed, cognitive and depression L. Referrals and appointments   In addition, I have reviewed and discussed with patient certain preventive protocols, quality metrics, and best practice recommendations. A written personalized care plan for preventive services as well as  general preventive health recommendations were provided to patient.   Signed,  Tyler Aas, LPN Nurse Health Advisor   Nurse Notes: none

## 2016-12-06 NOTE — Progress Notes (Signed)
Subjective:    Patient ID: [name], male    DOB: Jul 06, 1932, 83 y.o.   MRN: [id number]  [name] is a 83 y.o. male presenting on 12/06/2016 for Hypertension and Diabetes  HPI   Specialists: Cardiology - Dr Ida Rogue Urology - Dr John Giovanni ([facility]) - s/p Prostate CA resection, on testosterone replacement  Pre-Diabetes: Reports no concerns, prior history of well controlled A1c ranging from 5.6 to 5.9, last result was 5.6 in August 2018, he is one day early for A1c, and agrees to hold off No elevated sugar reading Meds: Not on medication Currently on not on ACEi/ARB Lifestyle: - Diet (balanced diet, tries to limit sugar/carbs)  - Exercise (limited recently, some walking) Denies hypoglycemia, polyuria, visual changes, numbness or tingling.  CAD s/p CABG x4 after prior 5 stents - Followed by Dr Rockey Situ Cardiology - History of AFib, s/p ablation/blocked, now no longer has Afib and no anticoagulation or rate control - Today reports that he has self discontinued Simvastatin recently within past few weeks because he "does not like statins" and talked to several people about them, he does not endorse any myalgias or cognitive difficulty with them, he continued to take Zetia and ASA 81, but would be okay resuming Simvastatin if necessary  FOLLOW-UP Mid to Low Back Pain, Subacute on Chronic / History of OA/DJD - Last visit with me 09/06/16, for initial visit for same problem, treated with conservative care and baclofen, see prior notes for background information. - Interval update with notable improvement overall - Today patient reports back pain is minimal now, mostly resolved has mild flares periodically, otherwise controlled on regimen of OTC Aleve 220 once daily, better result than Tylenol will take several pills PRN if need, and taking Baclofen 1 time daily PRN with good results, request refill  Health Maintenance: UTD Flu shot and Pneumonia Vaccines  Depression  screen [facility] 2/9 12/06/2016 12/06/2016 11/24/2015  Decreased Interest 0 0 0  Down, Depressed, Hopeless 0 0 0  PHQ - 2 Score 0 0 0    Social History   Tobacco Use  . Smoking status: Former Smoker    Types: Cigars    Last attempt to quit: 10/04/1988    Years since quitting: 28.1  . Smokeless tobacco: Former Systems developer    Types: Chew    Quit date: 09/04/1991  Substance Use Topics  . Alcohol use: Yes    Alcohol/week: 0.6 oz    Types: 1 Glasses of wine per week    Comment: occassionaly  . Drug use: No    Review of Systems Per HPI unless specifically indicated above     Objective:    BP 130/80 (BP Location: Left Arm, Cuff Size: Normal)   Pulse (!) 58   Temp 97.9 F (36.6 C) (Oral)   Resp 16   Ht 6\' 1"  (1.854 m)   Wt 166 lb 12.8 oz (75.7 kg)   BMI 22.01 kg/m   Wt Readings from Last 3 Encounters:  12/06/16 166 lb 12.8 oz (75.7 kg)  12/06/16 166 lb 12.8 oz (75.7 kg)  11/14/16 170 lb (77.1 kg)    Physical Exam  Constitutional: He is oriented to person, place, and time. He appears well-developed and well-nourished. No distress.  Well-appearing 83 year old male, comfortable, cooperative  HENT:  Head: Normocephalic and atraumatic.  Mouth/Throat: Oropharynx is clear and moist.  Eyes: Conjunctivae are normal. Right eye exhibits no discharge. Left eye exhibits no discharge.  Neck: Normal range of  motion. Neck supple. No thyromegaly present.  Cardiovascular: Normal rate, regular rhythm, normal heart sounds and intact distal pulses.  No murmur heard. Pulmonary/Chest: Effort normal and breath sounds normal. No respiratory distress. He has no wheezes. He has no rales.  Musculoskeletal: Normal range of motion. He exhibits no edema.  Back not re-examined today, without complaint  Lymphadenopathy:    He has no cervical adenopathy.  Neurological: He is alert and oriented to person, place, and time.  Skin: Skin is warm and dry. No rash noted. He is not diaphoretic. No erythema.    Psychiatric: He has a normal mood and affect. His behavior is normal.  Well groomed, good eye contact, normal speech and thoughts  Nursing note and vitals reviewed.  Recent Labs    05/23/16 1001 09/06/16 1050  HGBA1C 5.8* 5.6    Results for orders placed or performed in visit on 11/03/16  Testosterone  Result Value Ref Range   Testosterone 756 264 - 916 ng/dL  Hematocrit  Result Value Ref Range   Hematocrit 43.4 37.5 - 51.0 %      Assessment & Plan:   Problem List Items Addressed This Visit    AAA (abdominal aortic aneurysm) without rupture ([place])    On med management with Statin ASA Followed by Cardiology, previously stable      Chronic back pain    Stable, well controlled without flare Secondary to OA/DJD No new injury  Plan: 1. Counseling on caution with NSAIDs - last time advised him to use rarely only if high dose tylenol was not successful for PRN basis, he has been using Aleve OTC 220 mg once daily for weeks with good result - May continue Aleve OTC minimum dose 220 daily - should not increase dose, advised that he should take intermittently either every other day or every other week, should resume more regular Tylenol if possible - Continue Baclofen PRN with improvement, may use more frequently - Continue conservative care Follow-up as needed      Relevant Medications   baclofen (LIORESAL) 10 MG tablet   Coronary arteriosclerosis in native artery   Hyperlipidemia    Controlled cholesterol on statin and lifestyle Last lipid panel 05/2016 per Cardiology LDL at goal < 100 ASCVD known CAD s/p CABG  Plan: 1. Continue current meds - Simvastatin 40mg  daily - advised patient to resume his Statin due to his significant known vascular disease, after he recently self discontinued (not due to side effect), he will likely do this and review with Cardiology as well 2. Continue ASA 81mg  for secondary ASCVD risk reduction 3. Encourage improved lifestyle - low  carb/cholesterol, reduce portion size, continue improving regular exercise 4. Follow-up as planned 6 mo with me / Cardiology      Pre-diabetes - Primary    Previously well-controlled Pre-DM with last A1c 5.8 (08/2016) - 1 day early for A1c caution with coverage and has been < 6.0 for over 1 year will defer today Concern with HTN, HLD, CAD  Plan:  1. Not on any therapy currently - remain off 2. Encourage improved lifestyle - low carb, low sugar diet, reduce portion size, continue improving regular exercise 3. Follow-up 6 months Annual Phys and labs         Meds ordered this encounter  Medications  . baclofen (LIORESAL) 10 MG tablet    Sig: Take 1 tablet (10 mg total) by mouth 3 (three) times daily as needed for muscle spasms.    Dispense:  30 tablet  Refill:  5   Follow up plan: Return in about 6 months (around 06/05/2017) for Annual Physical.  Future labs ordered for 05/2017.  [name], [facility] 12/07/2016, 6:33 AM

## 2016-12-06 NOTE — Patient Instructions (Addendum)
Thank you for coming to the clinic today.  1. Recommend to take a Statin due to heart disease, but please discuss further with Dr Rockey Situ - I do think you may benefit from an alternative such as Repatha, these are expensive though. - Keep taking Zetia for now and may resume Simvastatin if Dr Rockey Situ agrees  2. Refilled Baclofen, keep taking Aleve low dose and Baclofen as you are. - Try to limit some Aleve dosing in future, may try every OTHER day or every few days, or one week on and week off  3. Keep up the good work  DUE for [place] (no food or drink after midnight before the lab appointment, only water or coffee without cream/sugar on the morning of)  SCHEDULE "Lab Only" visit in the morning at the clinic for lab draw in 6 MONTHS   - Make sure Lab Only appointment is at about 1 week before your next appointment, so that results will be available  For Lab Results, once available within 2-3 days of blood draw, you can can log in to MyChart online to view your results and a brief explanation. Also, we can discuss results at next follow-up visit.    Please schedule a Follow-up Appointment to: Return in about 6 months (around 06/05/2017) for Annual Physical.  If you have any other questions or concerns, please feel free to call the clinic or send a message through [place]. You may also schedule an earlier appointment if necessary.  Additionally, you may be receiving a survey about your experience at our clinic within a few days to 1 week by e-mail or mail. We value your feedback.  [name], DO [place]

## 2016-12-06 NOTE — Patient Instructions (Addendum)
[name] , Thank you for taking time to come for your Medicare Wellness Visit. I appreciate your ongoing commitment to your health goals. Please review the following plan we discussed and let me know if I can assist you in the future.   Screening recommendations/referrals: Colonoscopy: completed 07/12/2016, no longer required Recommended yearly ophthalmology/optometry visit for glaucoma screening and checkup Recommended yearly dental visit for hygiene and checkup  Vaccinations: Influenza vaccine: completed 09/19/2016 Pneumococcal vaccine: completed series Tdap vaccine: due, check with your insurance company for coverage Shingles vaccine: due, check with your insurance company for coverage  Advanced directives:Advance directive discussed with you today. Even though you declined this today please call our office should you change your mind and we can give you the proper paperwork for you to fill out.  Conditions/risks identified: Recommend drinking at least 6-8 glasses of water a day  Next appointment: Follow up in one year for your annual wellness exam  Preventive Care 65 Years and Older, Male Preventive care refers to lifestyle choices and visits with your health care provider that can promote health and wellness. What does preventive care include?  A yearly physical exam. This is also called an annual well check.  Dental exams once or twice a year.  Routine eye exams. Ask your health care provider how often you should have your eyes checked.  Personal lifestyle choices, including:  Daily care of your teeth and gums.  Regular physical activity.  Eating a healthy diet.  Avoiding tobacco and drug use.  Limiting alcohol use.  Practicing safe sex.  Taking low doses of aspirin every day.  Taking vitamin and mineral supplements as recommended by your health care provider. What happens during an annual well check? The services and screenings done by your health care provider  during your annual well check will depend on your age, overall health, lifestyle risk factors, and family history of disease. Counseling  Your health care provider may ask you questions about your:  Alcohol use.  Tobacco use.  Drug use.  Emotional well-being.  Home and relationship well-being.  Sexual activity.  Eating habits.  History of falls.  Memory and ability to understand (cognition).  Work and work Statistician. Screening  You may have the following tests or measurements:  Height, weight, and BMI.  Blood pressure.  Lipid and cholesterol levels. These may be checked every 5 years, or more frequently if you are over 80 years old.  Skin check.  Lung cancer screening. You may have this screening every year starting at age 63 if you have a 30-pack-year history of smoking and currently smoke or have quit within the past 15 years.  Fecal occult blood test (FOBT) of the stool. You may have this test every year starting at age 32.  Flexible sigmoidoscopy or colonoscopy. You may have a sigmoidoscopy every 5 years or a colonoscopy every 10 years starting at age 85.  Prostate cancer screening. Recommendations will vary depending on your family history and other risks.  Hepatitis C blood test.  Hepatitis B blood test.  Sexually transmitted disease (STD) testing.  Diabetes screening. This is done by checking your blood sugar (glucose) after you have not eaten for a while (fasting). You may have this done every 1-3 years.  Abdominal aortic aneurysm (AAA) screening. You may need this if you are a current or former smoker.  Osteoporosis. You may be screened starting at age 63 if you are at high risk. Talk with your health care provider about your  test results, treatment options, and if necessary, the need for more tests. Vaccines  Your health care provider may recommend certain vaccines, such as:  Influenza vaccine. This is recommended every year.  Tetanus,  diphtheria, and acellular pertussis (Tdap, Td) vaccine. You may need a Td booster every 10 years.  Zoster vaccine. You may need this after age 63.  Pneumococcal 13-valent conjugate (PCV13) vaccine. One dose is recommended after age 66.  Pneumococcal polysaccharide (PPSV23) vaccine. One dose is recommended after age 18. Talk to your health care provider about which screenings and vaccines you need and how often you need them. This information is not intended to replace advice given to you by your health care provider. Make sure you discuss any questions you have with your health care provider. Document Released: 01/23/2015 Document Revised: 09/16/2015 Document Reviewed: 10/28/2014 Elsevier Interactive Patient Education  2017 [place] Prevention in the Home Falls can cause injuries. They can happen to people of all ages. There are many things you can do to make your home safe and to help prevent falls. What can I do on the outside of my home?  Regularly fix the edges of walkways and driveways and fix any cracks.  Remove anything that might make you trip as you walk through a door, such as a raised step or threshold.  Trim any bushes or trees on the path to your home.  Use bright outdoor lighting.  Clear any walking paths of anything that might make someone trip, such as rocks or tools.  Regularly check to see if handrails are loose or broken. Make sure that both sides of any steps have handrails.  Any raised decks and porches should have guardrails on the edges.  Have any leaves, snow, or ice cleared regularly.  Use sand or salt on walking paths during winter.  Clean up any spills in your garage right away. This includes oil or grease spills. What can I do in the bathroom?  Use night lights.  Install grab bars by the toilet and in the tub and shower. Do not use towel bars as grab bars.  Use non-skid mats or decals in the tub or shower.  If you need to sit down in  the shower, use a plastic, non-slip stool.  Keep the floor dry. Clean up any water that spills on the floor as soon as it happens.  Remove soap buildup in the tub or shower regularly.  Attach bath mats securely with double-sided non-slip rug tape.  Do not have throw rugs and other things on the floor that can make you trip. What can I do in the bedroom?  Use night lights.  Make sure that you have a light by your bed that is easy to reach.  Do not use any sheets or blankets that are too big for your bed. They should not hang down onto the floor.  Have a firm chair that has side arms. You can use this for support while you get dressed.  Do not have throw rugs and other things on the floor that can make you trip. What can I do in the kitchen?  Clean up any spills right away.  Avoid walking on wet floors.  Keep items that you use a lot in easy-to-reach places.  If you need to reach something above you, use a strong step stool that has a grab bar.  Keep electrical cords out of the way.  Do not use floor polish or wax that  makes floors slippery. If you must use wax, use non-skid floor wax.  Do not have throw rugs and other things on the floor that can make you trip. What can I do with my stairs?  Do not leave any items on the stairs.  Make sure that there are handrails on both sides of the stairs and use them. Fix handrails that are broken or loose. Make sure that handrails are as long as the stairways.  Check any carpeting to make sure that it is firmly attached to the stairs. Fix any carpet that is loose or worn.  Avoid having throw rugs at the top or bottom of the stairs. If you do have throw rugs, attach them to the floor with carpet tape.  Make sure that you have a light switch at the top of the stairs and the bottom of the stairs. If you do not have them, ask someone to add them for you. What else can I do to help prevent falls?  Wear shoes that:  Do not have high  heels.  Have rubber bottoms.  Are comfortable and fit you well.  Are closed at the toe. Do not wear sandals.  If you use a stepladder:  Make sure that it is fully opened. Do not climb a closed stepladder.  Make sure that both sides of the stepladder are locked into place.  Ask someone to hold it for you, if possible.  Clearly mark and make sure that you can see:  Any grab bars or handrails.  First and last steps.  Where the edge of each step is.  Use tools that help you move around (mobility aids) if they are needed. These include:  Canes.  Walkers.  Scooters.  Crutches.  Turn on the lights when you go into a dark area. Replace any light bulbs as soon as they burn out.  Set up your furniture so you have a clear path. Avoid moving your furniture around.  If any of your floors are uneven, fix them.  If there are any pets around you, be aware of where they are.  Review your medicines with your doctor. Some medicines can make you feel dizzy. This can increase your chance of falling. Ask your doctor what other things that you can do to help prevent falls. This information is not intended to replace advice given to you by your health care provider. Make sure you discuss any questions you have with your health care provider. Document Released: 10/23/2008 Document Revised: 06/04/2015 Document Reviewed: 01/31/2014 Elsevier Interactive Patient Education  2017 [employer].

## 2016-12-07 ENCOUNTER — Ambulatory Visit: Payer: Self-pay | Admitting: Family Medicine

## 2016-12-07 ENCOUNTER — Other Ambulatory Visit: Payer: Self-pay | Admitting: Family Medicine

## 2016-12-07 DIAGNOSIS — R7303 Prediabetes: Secondary | ICD-10-CM

## 2016-12-07 DIAGNOSIS — E782 Mixed hyperlipidemia: Secondary | ICD-10-CM

## 2016-12-07 DIAGNOSIS — I10 Essential (primary) hypertension: Secondary | ICD-10-CM

## 2016-12-07 DIAGNOSIS — Z79899 Other long term (current) drug therapy: Secondary | ICD-10-CM

## 2016-12-07 DIAGNOSIS — E291 Testicular hypofunction: Secondary | ICD-10-CM

## 2016-12-07 DIAGNOSIS — I2510 Atherosclerotic heart disease of native coronary artery without angina pectoris: Secondary | ICD-10-CM

## 2016-12-07 DIAGNOSIS — Z0000 Encounter for general adult medical examination without abnormal findings: Secondary | ICD-10-CM

## 2016-12-07 NOTE — Assessment & Plan Note (Signed)
Previously well-controlled Pre-DM with last A1c 5.8 (08/2016) - 1 day early for A1c caution with coverage and has been < 6.0 for over 1 year will defer today Concern with HTN, HLD, CAD  Plan:  1. Not on any therapy currently - remain off 2. Encourage improved lifestyle - low carb, low sugar diet, reduce portion size, continue improving regular exercise 3. Follow-up 6 months Annual Phys and labs

## 2016-12-07 NOTE — Assessment & Plan Note (Signed)
Stable, well controlled without flare Secondary to OA/DJD No new injury  Plan: 1. Counseling on caution with NSAIDs - last time advised him to use rarely only if high dose tylenol was not successful for PRN basis, he has been using Aleve OTC 220 mg once daily for weeks with good result - May continue Aleve OTC minimum dose 220 daily - should not increase dose, advised that he should take intermittently either every other day or every other week, should resume more regular Tylenol if possible - Continue Baclofen PRN with improvement, may use more frequently - Continue conservative care Follow-up as needed

## 2016-12-07 NOTE — Assessment & Plan Note (Signed)
On med management with Statin ASA Followed by Cardiology, previously stable

## 2016-12-07 NOTE — Assessment & Plan Note (Signed)
Controlled cholesterol on statin and lifestyle Last lipid panel 05/2016 per Cardiology LDL at goal < 100 ASCVD known CAD s/p CABG  Plan: 1. Continue current meds - Simvastatin 40mg  daily - advised patient to resume his Statin due to his significant known vascular disease, after he recently self discontinued (not due to side effect), he will likely do this and review with Cardiology as well 2. Continue ASA 81mg  for secondary ASCVD risk reduction 3. Encourage improved lifestyle - low carb/cholesterol, reduce portion size, continue improving regular exercise 4. Follow-up as planned 6 mo with me / Cardiology

## 2016-12-12 ENCOUNTER — Ambulatory Visit: Payer: PPO

## 2016-12-12 DIAGNOSIS — E291 Testicular hypofunction: Secondary | ICD-10-CM

## 2016-12-12 NOTE — Progress Notes (Signed)
Pt presented today to be helped with how to draw up testosterone medication. Reinforced with pt how to draw up and administer testosterone cypionate. Offered pt to come into clinic q2weeks instead of administering at home. Pt stated that he would try to self inject one more time and if it doesn't work he will come in on the nurse schedule.

## 2017-02-20 DIAGNOSIS — H25813 Combined forms of age-related cataract, bilateral: Secondary | ICD-10-CM | POA: Diagnosis not present

## 2017-03-16 DIAGNOSIS — H40053 Ocular hypertension, bilateral: Secondary | ICD-10-CM | POA: Diagnosis not present

## 2017-03-30 DIAGNOSIS — H40053 Ocular hypertension, bilateral: Secondary | ICD-10-CM | POA: Diagnosis not present

## 2017-03-31 DIAGNOSIS — H401121 Primary open-angle glaucoma, left eye, mild stage: Secondary | ICD-10-CM | POA: Diagnosis not present

## 2017-05-03 ENCOUNTER — Telehealth: Payer: Self-pay | Admitting: Urology

## 2017-05-03 ENCOUNTER — Other Ambulatory Visit: Payer: PPO

## 2017-05-03 DIAGNOSIS — E291 Testicular hypofunction: Secondary | ICD-10-CM | POA: Diagnosis not present

## 2017-05-03 DIAGNOSIS — Z8546 Personal history of malignant neoplasm of prostate: Secondary | ICD-10-CM

## 2017-05-03 NOTE — Telephone Encounter (Signed)
Pt is here for 6 mos labs including his Testosterone, pt stated he just had his 2 week injection about 2 days ago, just FYI for when you get his results. Thanks.

## 2017-05-03 NOTE — Telephone Encounter (Signed)
Noted  

## 2017-05-04 LAB — PSA: Prostate Specific Ag, Serum: <0.1 ng/mL (ref 0.0–4.0)

## 2017-05-04 LAB — HEMATOCRIT: Hematocrit: 42.8 % (ref 37.5–51.0)

## 2017-05-04 LAB — TESTOSTERONE: Testosterone: 559 ng/dL (ref 264–916)

## 2017-05-08 ENCOUNTER — Telehealth: Payer: Self-pay

## 2017-05-08 NOTE — Telephone Encounter (Signed)
-----   Message from Abbie Sons, MD sent at 05/07/2017 10:49 AM EDT ----- Testosterone level looks good at 559.  Hematocrit normal at 42.8.  PSA was <0.1

## 2017-05-26 ENCOUNTER — Encounter (INDEPENDENT_AMBULATORY_CARE_PROVIDER_SITE_OTHER): Payer: Self-pay

## 2017-05-26 ENCOUNTER — Telehealth: Payer: Self-pay

## 2017-05-26 DIAGNOSIS — I714 Abdominal aortic aneurysm, without rupture, unspecified: Secondary | ICD-10-CM

## 2017-05-26 DIAGNOSIS — E782 Mixed hyperlipidemia: Secondary | ICD-10-CM

## 2017-05-26 DIAGNOSIS — E559 Vitamin D deficiency, unspecified: Secondary | ICD-10-CM

## 2017-05-26 DIAGNOSIS — R7303 Prediabetes: Secondary | ICD-10-CM

## 2017-05-26 DIAGNOSIS — R5383 Other fatigue: Secondary | ICD-10-CM

## 2017-05-26 DIAGNOSIS — R413 Other amnesia: Secondary | ICD-10-CM

## 2017-05-26 DIAGNOSIS — E538 Deficiency of other specified B group vitamins: Secondary | ICD-10-CM

## 2017-05-26 DIAGNOSIS — I10 Essential (primary) hypertension: Secondary | ICD-10-CM

## 2017-05-26 NOTE — Telephone Encounter (Signed)
Wife called and very concerned for her husband.  He is very fatigue, brain foggy, and lack of appetite. He is coming at the end of month for labs before his appointment and she would like any vit blood work ran that might cause these issues.  ( B12 and Vit D)   I did tell her to please come to his appointment with him to talk about her concerns.

## 2017-05-26 NOTE — Telephone Encounter (Signed)
Left message for patient to call back  

## 2017-05-26 NOTE — Telephone Encounter (Signed)
Attempted to call patient and wife back today, did not reach them. I left a voicemail and advised that we can add on other blood tests, but we may have to re-schedule or change his physical appointment, due to his new medical concerns.  I will send mychart as well.  He is scheduled for blood work on 5/23  He is scheduled for annual physical on 5/29  He has [employer] / Medicare - therefore he would be covered for Annual Physical, however given his current health concerns, I would recommend that we post pone the physical and focus more on this new issue.  I would still recommend blood draw on 5/23, and he can keep his apt on 5/29 but we will not do a general physical, instead we will address his concern with mood, memory and fatigue.  I would recommend checking MMSE mental status exam again at that time.  [name], [facility] 05/26/2017, 12:45 PM

## 2017-05-29 NOTE — Telephone Encounter (Signed)
Patient responded through MyChart

## 2017-05-31 ENCOUNTER — Other Ambulatory Visit: Payer: Self-pay

## 2017-05-31 DIAGNOSIS — R413 Other amnesia: Secondary | ICD-10-CM

## 2017-05-31 DIAGNOSIS — I714 Abdominal aortic aneurysm, without rupture, unspecified: Secondary | ICD-10-CM

## 2017-05-31 DIAGNOSIS — I10 Essential (primary) hypertension: Secondary | ICD-10-CM

## 2017-05-31 DIAGNOSIS — E559 Vitamin D deficiency, unspecified: Secondary | ICD-10-CM

## 2017-05-31 DIAGNOSIS — E782 Mixed hyperlipidemia: Secondary | ICD-10-CM

## 2017-05-31 DIAGNOSIS — R7303 Prediabetes: Secondary | ICD-10-CM

## 2017-05-31 DIAGNOSIS — E538 Deficiency of other specified B group vitamins: Secondary | ICD-10-CM

## 2017-06-01 ENCOUNTER — Other Ambulatory Visit: Payer: PPO

## 2017-06-01 DIAGNOSIS — R413 Other amnesia: Secondary | ICD-10-CM | POA: Diagnosis not present

## 2017-06-01 DIAGNOSIS — E782 Mixed hyperlipidemia: Secondary | ICD-10-CM | POA: Diagnosis not present

## 2017-06-01 DIAGNOSIS — R7303 Prediabetes: Secondary | ICD-10-CM | POA: Diagnosis not present

## 2017-06-01 DIAGNOSIS — I10 Essential (primary) hypertension: Secondary | ICD-10-CM | POA: Diagnosis not present

## 2017-06-02 LAB — CBC WITH DIFFERENTIAL/PLATELET
BASOS PCT: 0.6 %
Basophils Absolute: 32 cells/uL (ref 0–200)
EOS PCT: 2.1 %
Eosinophils Absolute: 111 cells/uL (ref 15–500)
HCT: 40.6 % (ref 38.5–50.0)
HEMOGLOBIN: 13.7 g/dL (ref 13.2–17.1)
Lymphs Abs: 1007 cells/uL (ref 850–3900)
MCH: 28.2 pg (ref 27.0–33.0)
MCHC: 33.7 g/dL (ref 32.0–36.0)
MCV: 83.5 fL (ref 80.0–100.0)
MONOS PCT: 8.7 %
MPV: 12.3 fL (ref 7.5–12.5)
NEUTROS PCT: 69.6 %
Neutro Abs: 3689 cells/uL (ref 1500–7800)
Platelets: 187 10*3/uL (ref 140–400)
RBC: 4.86 10*6/uL (ref 4.20–5.80)
RDW: 12.7 % (ref 11.0–15.0)
TOTAL LYMPHOCYTE: 19 %
WBC mixed population: 461 cells/uL (ref 200–950)
WBC: 5.3 10*3/uL (ref 3.8–10.8)

## 2017-06-02 LAB — COMPLETE METABOLIC PANEL WITH GFR
AG RATIO: 1.8 (calc) (ref 1.0–2.5)
ALKALINE PHOSPHATASE (APISO): 71 U/L (ref 40–115)
ALT: 10 U/L (ref 9–46)
AST: 16 U/L (ref 10–35)
Albumin: 4.4 g/dL (ref 3.6–5.1)
BILIRUBIN TOTAL: 1.2 mg/dL (ref 0.2–1.2)
BUN: 17 mg/dL (ref 7–25)
CALCIUM: 9.7 mg/dL (ref 8.6–10.3)
CHLORIDE: 103 mmol/L (ref 98–110)
CO2: 30 mmol/L (ref 20–32)
Creat: 1.04 mg/dL (ref 0.70–1.11)
GFR, Est African American: 77 mL/min/{1.73_m2} (ref >=60)
GFR, Est Non African American: 67 mL/min/{1.73_m2} (ref >=60)
GLOBULIN: 2.5 g/dL (ref 1.9–3.7)
Glucose, Bld: 101 mg/dL — ABNORMAL HIGH (ref 65–99)
POTASSIUM: 4.2 mmol/L (ref 3.5–5.3)
SODIUM: 140 mmol/L (ref 135–146)
Total Protein: 6.9 g/dL (ref 6.1–8.1)

## 2017-06-02 LAB — LIPID PANEL
CHOL/HDL RATIO: 2.9 (calc) (ref <5.0)
CHOLESTEROL: 113 mg/dL (ref <200)
HDL: 39 mg/dL — AB (ref >40)
LDL CHOLESTEROL (CALC): 59 mg/dL
NON-HDL CHOLESTEROL (CALC): 74 mg/dL (ref <130)
Triglycerides: 69 mg/dL (ref <150)

## 2017-06-02 LAB — T4, FREE: FREE T4: 1.1 ng/dL (ref 0.8–1.8)

## 2017-06-02 LAB — HEMOGLOBIN A1C
EAG (MMOL/L): 6.3 (calc)
HEMOGLOBIN A1C: 5.6 %{Hb} (ref <5.7)
MEAN PLASMA GLUCOSE: 114 (calc)

## 2017-06-02 LAB — TSH: TSH: 2.31 m[IU]/L (ref 0.40–4.50)

## 2017-06-02 LAB — VITAMIN B12: Vitamin B-12: 371 pg/mL (ref 200–1100)

## 2017-06-02 LAB — VITAMIN D 25 HYDROXY (VIT D DEFICIENCY, FRACTURES): Vit D, 25-Hydroxy: 20 ng/mL — ABNORMAL LOW (ref 30–100)

## 2017-06-07 ENCOUNTER — Encounter: Payer: Self-pay | Admitting: Family Medicine

## 2017-06-07 ENCOUNTER — Ambulatory Visit: Payer: Self-pay | Admitting: Family Medicine

## 2017-06-07 ENCOUNTER — Ambulatory Visit (INDEPENDENT_AMBULATORY_CARE_PROVIDER_SITE_OTHER): Payer: PPO | Admitting: Family Medicine

## 2017-06-07 VITALS — BP 118/66 | HR 61 | Temp 98.3°F | Resp 16 | Ht 73.0 in | Wt 157.0 lb

## 2017-06-07 DIAGNOSIS — M545 Low back pain: Secondary | ICD-10-CM | POA: Diagnosis not present

## 2017-06-07 DIAGNOSIS — M150 Primary generalized (osteo)arthritis: Secondary | ICD-10-CM | POA: Diagnosis not present

## 2017-06-07 DIAGNOSIS — E782 Mixed hyperlipidemia: Secondary | ICD-10-CM

## 2017-06-07 DIAGNOSIS — G8929 Other chronic pain: Secondary | ICD-10-CM

## 2017-06-07 DIAGNOSIS — R6889 Other general symptoms and signs: Secondary | ICD-10-CM | POA: Diagnosis not present

## 2017-06-07 DIAGNOSIS — F03A0 Unspecified dementia, mild, without behavioral disturbance, psychotic disturbance, mood disturbance, and anxiety: Secondary | ICD-10-CM | POA: Insufficient documentation

## 2017-06-07 DIAGNOSIS — M159 Polyosteoarthritis, unspecified: Secondary | ICD-10-CM

## 2017-06-07 DIAGNOSIS — R7303 Prediabetes: Secondary | ICD-10-CM

## 2017-06-07 NOTE — Patient Instructions (Addendum)
Thank you for coming to the office today.  Concern about Simvastatin medication - this may be a potential source of some of your symptoms. But I don't think this is the problem. In future you may ask him about Repatha or Praluent to discuss if these are good options in future for better results and possible less side effects.  Your cognitive function is normal on our test. You may have very mild forgetfulness which can be normal.  If you want to try CBD Oil that is reasonable, decision is up to you. I do not have any specific recommendation regarding this medication.  Recommend to start taking Tylenol Extra Strength 500mg  tabs - take 1 to 2 tabs per dose (max 1000mg ) every 6-8 hours for pain (take regularly, don't skip a dose for next 7 days), max 24 hour daily dose is 6 tablets or 3000mg . In the future you can repeat the same everyday Tylenol course for 1-2 weeks at a time.  - Safe to take with Aleve  Continue the Vitamin D treatment  Please schedule a Follow-up Appointment to: Return in about 6 months (around 12/08/2017) for PreDM A1c / Memory/Forgetful / Tired / Arthritis Back Pain.  If you have any other questions or concerns, please feel free to call the office or send a message through [place]. You may also schedule an earlier appointment if necessary.  Additionally, you may be receiving a survey about your experience at our office within a few days to 1 week by e-mail or mail. We value your feedback.  [name], DO [place]

## 2017-06-07 NOTE — Progress Notes (Signed)
Subjective:    Patient ID: [name], male    DOB: 01-10-1933, 84 y.o.   MRN: [id number]  [name] is a 84 y.o. male presenting on 06/07/2017 for Fatigue; Memory Loss; and Hyperlipidemia   HPI   Memory Loss / Tiredness Reduced Eyesight Followed in past by Dr Gloriann Loan and also recently Dr Sandra Cockayne, and he is on eye drops for glaucoma - He admits feeling tired and more fatigued due to vision often when trying to read or do puzzles - Admits some memory loss at times with forgetfulness, usually minor things, if goes to other room will forget what he was doing, difficulty with recall, has not had significant episodes of getting lost, wandering or behavioral changes - See MMSE below Denies exertional dyspnea or fatigue, excessive sleepiness  Low Vitamin D Recent lab showed lab result 20 Start OTC Vitamin D3 5,000 iu daily for 12 weeks then reduce to OTC Vitamin D3 2,000 iu daily for maintenance  CAD s/p CABG / HYPERLIPIDEMIA: Followed by [facility] Dr Rockey Situ - Reports no concerns. Last lipid panel 05/2017, mostly controlled except low HDL - Currently taking Simvastatin 40mg  and ASA 81, tolerating well without side effects or myalgias Lifestyle - Diet: balanced - Exercise: limited  Chronic Osteoarthritis, Back Pain Previously prescribed Baclofen, but he took this regularly and he had some problem with swallowing this pill and stopped this. - He is asking about trying CBD Oil - He is taking some regular Aleve PRN   MMSE - Mini Mental State Exam 06/07/2017 11/24/2015 11/28/2014  Orientation to time 5 5 5   Orientation to Place 5 5 5   Registration 3 3 3   Attention/ Calculation 5 5 5   Recall 3 3 3   Language- name 2 objects 2 2 2   Language- repeat 1 1 1   Language- follow 3 step command 3 3 3   Language- read & follow direction 1 1 1   Write a sentence 1 1 1   Copy design 1 1 1   Total score 30 30 30     Depression screen [facility] 2/9 06/07/2017 12/06/2016 12/06/2016  Decreased  Interest 1 0 0  Down, Depressed, Hopeless 0 0 0  PHQ - 2 Score 1 0 0  Difficult doing work/chores Somewhat difficult - -    Social History   Tobacco Use  . Smoking status: Former Smoker    Types: Cigars    Last attempt to quit: 10/04/1988    Years since quitting: 28.6  . Smokeless tobacco: Former Systems developer    Types: Chew    Quit date: 09/04/1991  Substance Use Topics  . Alcohol use: Yes    Alcohol/week: 0.6 oz    Types: 1 Glasses of wine per week    Comment: occassionaly  . Drug use: No    Review of Systems Per HPI unless specifically indicated above     Objective:    BP 118/66   Pulse 61   Temp 98.3 F (36.8 C) (Oral)   Resp 16   Ht 6\' 1"  (1.854 m)   Wt 157 lb (71.2 kg)   BMI 20.71 kg/m   Wt Readings from Last 3 Encounters:  06/07/17 157 lb (71.2 kg)  12/06/16 166 lb 12.8 oz (75.7 kg)  12/06/16 166 lb 12.8 oz (75.7 kg)    Physical Exam  Constitutional: He is oriented to person, place, and time. He appears well-developed and well-nourished. No distress.  Well-appearing elderly 84 year old male, comfortable, cooperative, slightly thin appearing  HENT:  Head: Normocephalic and atraumatic.  Mouth/Throat: Oropharynx is clear and moist.  Eyes: Conjunctivae are normal. Right eye exhibits no discharge. Left eye exhibits no discharge.  Neck: Normal range of motion. Neck supple. No thyromegaly present.  Cardiovascular: Normal rate, regular rhythm, normal heart sounds and intact distal pulses.  No murmur heard. Pulmonary/Chest: Effort normal and breath sounds normal. No respiratory distress. He has no wheezes. He has no rales.  Musculoskeletal: Normal range of motion. He exhibits no edema.  Lymphadenopathy:    He has no cervical adenopathy.  Neurological: He is alert and oriented to person, place, and time.  Skin: Skin is warm and dry. No rash noted. He is not diaphoretic. No erythema.  Psychiatric: He has a normal mood and affect. His behavior is normal.  Well groomed,  good eye contact, normal speech and thoughts  Nursing note and vitals reviewed.  Results for orders placed or performed in visit on 05/31/17  T4, free  Result Value Ref Range   Free T4 1.1 0.8 - 1.8 ng/dL  TSH  Result Value Ref Range   TSH 2.31 0.40 - 4.50 mIU/L  Vitamin B12  Result Value Ref Range   Vitamin B-12 371 200 - 1,100 pg/mL  VITAMIN D 25 Hydroxy (Vit-D Deficiency, Fractures)  Result Value Ref Range   Vit D, 25-Hydroxy 20 (L) 30 - 100 ng/mL  Lipid panel  Result Value Ref Range   Cholesterol 113 <200 mg/dL   HDL 39 (L) >40 mg/dL   Triglycerides 69 <150 mg/dL   LDL Cholesterol (Calc) 59 mg/dL (calc)   Total CHOL/HDL Ratio 2.9 <5.0 (calc)   Non-HDL Cholesterol (Calc) 74 <130 mg/dL (calc)  COMPLETE METABOLIC PANEL WITH GFR  Result Value Ref Range   Glucose, Bld 101 (H) 65 - 99 mg/dL   BUN 17 7 - 25 mg/dL   Creat 1.04 0.70 - 1.11 mg/dL   GFR, Est Non African American 67 > OR = 60 mL/min/1.88m2   GFR, Est African American 77 > OR = 60 mL/min/1.58m2   BUN/Creatinine Ratio NOT APPLICABLE 6 - 22 (calc)   Sodium 140 135 - 146 mmol/L   Potassium 4.2 3.5 - 5.3 mmol/L   Chloride 103 98 - 110 mmol/L   CO2 30 20 - 32 mmol/L   Calcium 9.7 8.6 - 10.3 mg/dL   Total Protein 6.9 6.1 - 8.1 g/dL   Albumin 4.4 3.6 - 5.1 g/dL   Globulin 2.5 1.9 - 3.7 g/dL (calc)   AG Ratio 1.8 1.0 - 2.5 (calc)   Total Bilirubin 1.2 0.2 - 1.2 mg/dL   Alkaline phosphatase (APISO) 71 40 - 115 U/L   AST 16 10 - 35 U/L   ALT 10 9 - 46 U/L  CBC with Differential/Platelet  Result Value Ref Range   WBC 5.3 3.8 - 10.8 Thousand/uL   RBC 4.86 4.20 - 5.80 Million/uL   Hemoglobin 13.7 13.2 - 17.1 g/dL   HCT 40.6 38.5 - 50.0 %   MCV 83.5 80.0 - 100.0 fL   MCH 28.2 27.0 - 33.0 pg   MCHC 33.7 32.0 - 36.0 g/dL   RDW 12.7 11.0 - 15.0 %   Platelets 187 140 - 400 Thousand/uL   MPV 12.3 7.5 - 12.5 fL   Neutro Abs 3,689 1,500 - 7,800 cells/uL   Lymphs Abs 1,007 850 - 3,900 cells/uL   WBC mixed population 461  200 - 950 cells/uL   Eosinophils Absolute 111 15 - 500 cells/uL   Basophils Absolute 32  0 - 200 cells/uL   Neutrophils Relative % 69.6 %   Total Lymphocyte 19.0 %   Monocytes Relative 8.7 %   Eosinophils Relative 2.1 %   Basophils Relative 0.6 %  Hemoglobin A1c  Result Value Ref Range   Hgb A1c MFr Bld 5.6 <5.7 % of total Hgb   Mean Plasma Glucose 114 (calc)   eAG (mmol/L) 6.3 (calc)      Assessment & Plan:   Problem List Items Addressed This Visit    Chronic back pain   Forgetfulness    Clinically not consistent with significant mild cognitive impairment MMSE 30/30, unchanged in 2 years Seems normal forgetfulness of aging, some worse can be contributed by other factors Cannot rule out statin affecting him but suspect benefits dramatically outweighs risk of taking statin - can review with cards consider PSK9 inhib Reassurance overall - no significant depression as well - elevated PHQ score with sleeping and moving slow but technically cannot count PHQ9 if 2 is normal      Hyperlipidemia    Controlled cholesterol on statin and lifestyle Last lipid panel 05/2017 per Cardiology LDL at goal < 100 ASCVD known CAD s/p CABG  Plan: 1. Discuss possible concern w/ Statin affecting memory - however given MMSE 30/30 suspect his potential for mild cognitive impairment or other cognitive problem is low and emphasis on reducing ASCVD risk is most important especially in patient with known disease.  - Continue current meds - Simvastatin 40mg  daily - he may review with Cardiology as well consider future repatha vs praluent 2. Continue ASA 81mg  for secondary ASCVD risk reduction 3. Encourage improved lifestyle - low carb/cholesterol, reduce portion size, continue improving regular exercise 4. Follow-up as planned 6 mo with me / f/u Cardiology      Pre-diabetes - Primary    Stable well-controlled Pre-DM with A1c 5.6 to 5.6 Concern with HTN, HLD, CAD  Plan:  1. Not on any therapy currently -  remain off 2. Encourage improved lifestyle - low carb, low sugar diet, reduce portion size, continue improving regular exercise 3. Follow-up 6 months A1c      Primary osteoarthritis involving multiple joints    Stable chronic problem, without flare or worsening Reviewed med recs - may increase Tylenol safe dose and limit NSAID Aleve, also uncertain if CBD oil is helpful, use other supplements with caution         No orders of the defined types were placed in this encounter.     Follow up plan: Return in about 6 months (around 12/08/2017) for PreDM A1c / Memory/Forgetful / Tired / Arthritis Back Pain.  [name], DO [facility] 06/08/2017, 1:59 AM

## 2017-06-08 NOTE — Assessment & Plan Note (Signed)
Clinically not consistent with significant mild cognitive impairment MMSE 30/30, unchanged in 2 years Seems normal forgetfulness of aging, some worse can be contributed by other factors Cannot rule out statin affecting him but suspect benefits dramatically outweighs risk of taking statin - can review with cards consider PSK9 inhib Reassurance overall - no significant depression as well - elevated PHQ score with sleeping and moving slow but technically cannot count PHQ9 if 2 is normal

## 2017-06-08 NOTE — Assessment & Plan Note (Signed)
Stable well-controlled Pre-DM with A1c 5.6 to 5.6 Concern with HTN, HLD, CAD  Plan:  1. Not on any therapy currently - remain off 2. Encourage improved lifestyle - low carb, low sugar diet, reduce portion size, continue improving regular exercise 3. Follow-up 6 months A1c

## 2017-06-08 NOTE — Assessment & Plan Note (Signed)
Stable chronic problem, without flare or worsening Reviewed med recs - may increase Tylenol safe dose and limit NSAID Aleve, also uncertain if CBD oil is helpful, use other supplements with caution

## 2017-06-08 NOTE — Assessment & Plan Note (Signed)
Controlled cholesterol on statin and lifestyle Last lipid panel 05/2017 per Cardiology LDL at goal < 100 ASCVD known CAD s/p CABG  Plan: 1. Discuss possible concern w/ Statin affecting memory - however given MMSE 30/30 suspect his potential for mild cognitive impairment or other cognitive problem is low and emphasis on reducing ASCVD risk is most important especially in patient with known disease.  - Continue current meds - Simvastatin 40mg  daily - he may review with Cardiology as well consider future repatha vs praluent 2. Continue ASA 81mg  for secondary ASCVD risk reduction 3. Encourage improved lifestyle - low carb/cholesterol, reduce portion size, continue improving regular exercise 4. Follow-up as planned 6 mo with me / f/u Cardiology

## 2017-06-24 NOTE — Progress Notes (Signed)
Cardiology Office Note  Date:  06/27/2017   ID:  [name], DOB [birth year], MRN [id number]  PCP:  Darrell Hauser, DO   Chief Complaint  Patient presents with  . OTHER    12 month f/u no complaints today. Meds reviewed verbally with pt.    HPI:  [name] is a very pleasant 84 year old gentleman with a history of  coronary artery disease, bypass surgery may 2014  atrial fibrillation, previously on amiodarone, s/p maze stent placed to his LAD in 1993, stent placed to his mid RCA in 2003,  in-stent restenosis with repeat stent placed to his LAD in September 08,  stent placed to his RCA at the same time,  stress test in February 2009 and Jan 2014 which showed no ischemia,   low testosterone  Constipation Back pain He presents for routine followup of his coronary artery disease  arthritis pain, in back, managable Down 10 pounds, in 6 months Not eating as much  not exercising as he did in the past  Was going to fitness gym in the [name]  Denies any symptoms of chest pain concerning for angina  Throat constricted, certain foods get stuck  Previous [name] work  Total chol 128,LDL 79 from 2017   EKG personally reviewed by myself on todays visit Shows normal sinus rhythm rate 60 bpm no significant ST [name] T-wave changes, APCs noted  Other past medical history Previous episode of dizziness in 2015 dizziness. He was unable to get out of bed without assistance. Dizziness occurred while supine as well as sitting and standing. He went to the emergency room, was told that he could be dehydrated and was given IV fluids x2 bags. This seemed to improve his symptoms and was discharged home. Other workup in the [name] was negative including negative cardiac enzymes, normal basic metabolic panel, normal LFTs, normal EKG  Previously had chest pain in may 2014. Presented to [name], cardiac catheterization showing subtotal occlusion of his LAD stent, in-stent restenosis, severe OM1  and OM 2 disease also with RCA disease,  On  05/18/2012.  He underwent CABG x 4 utilizing LIMA to LAD, SVG to OM1, SVG to OM2, and SVG to PDA.  He also underwent Complete MAZE procedure with Clipping of Left Atrial Appendage.    He did not tolerate beta blockers [name] ACE inhibitor he had malaise, bradycardia. He took himself off these medications.   In  January 2014 he  had an episode of atrial fibrillation and presented to the emergency room. Converted back to normal sinus rhythm. Discharged on pradaxa. Recurrent episodes of atrial fibrillation shortly after that.   PMH:   has a past medical history of Arthritis, Coronary artery disease, History of prostate cancer, Hyperlipidemia, Hyperthyroidism, Low testosterone, PAF (paroxysmal atrial fibrillation) ([name]), Prostate cancer ([name]), and S/P CABG x 4.  PSH:    Past Surgical History:  Procedure Laterality Date  . APPENDECTOMY     [name]  . CARDIAC CATHETERIZATION  2008  . CLIPPING OF ATRIAL APPENDAGE Left 05/18/2012   Procedure: CLIPPING OF ATRIAL APPENDAGE;  Surgeon: Darrell Poot, MD;  Location: [name];  Service: Open Heart Surgery;  Laterality: Left;  . COLONOSCOPY  2013  . COLONOSCOPY WITH PROPOFOL N/A 07/12/2016   Procedure: COLONOSCOPY WITH PROPOFOL;  Surgeon: Darrell Lame, MD;  Location: [name] ENDOSCOPY;  Service: Endoscopy;  Laterality: N/A;  . CORONARY ARTERY BYPASS GRAFT N/A 05/18/2012   Procedure: CORONARY ARTERY BYPASS GRAFTING (CABG);  Surgeon: Darrell Poot, MD;  Location: [name];  Service: Open Heart Surgery;  Laterality: N/A;  Times 4 using left internal mammary artery and endoscopically harvested right saphenous vein  . HERNIA REPAIR     s/p mesh bilaterally, [name]  . INTRAOPERATIVE TRANSESOPHAGEAL ECHOCARDIOGRAM N/A 05/18/2012   Procedure: INTRAOPERATIVE TRANSESOPHAGEAL ECHOCARDIOGRAM;  Surgeon: Darrell Poot, MD;  Location: [name];  Service: Open Heart Surgery;  Laterality: N/A;  . LEFT HEART CATHETERIZATION WITH  CORONARY ANGIOGRAM Bilateral 05/16/2012   Procedure: LEFT HEART CATHETERIZATION WITH CORONARY ANGIOGRAM;  Surgeon: Darrell Blanks, MD;  Location: [name];  Service: Cardiovascular;  Laterality: Bilateral;  . MAZE N/A 05/18/2012   Procedure: MAZE;  Surgeon: Darrell Poot, MD;  Location: [name];  Service: Open Heart Surgery;  Laterality: N/A;  . MOHS SURGERY    . PROSTATECTOMY    . TOTAL HIP ARTHROPLASTY Right    [name]    Current Outpatient Medications  Medication Sig Dispense Refill  . Acetaminophen (TYLENOL ARTHRITIS PAIN PO) Take by mouth as needed.    [name] aspirin EC 81 MG tablet Take 81 mg by mouth every evening.    . ezetimibe (ZETIA) 10 MG tablet Take 1 tablet (10 mg total) by mouth daily. 90 tablet 3  . latanoprost (XALATAN) 0.005 % ophthalmic solution     . simvastatin (ZOCOR) 40 MG tablet TAKE 1 TABLET (40 MG TOTAL) BY MOUTH AT BEDTIME. 90 tablet 3  . testosterone cypionate (DEPOTESTOSTERONE CYPIONATE) 200 MG/ML injection Inject 0.3 mLs (60 mg total) into the muscle every 14 (fourteen) days. 10 mL 0  . timolol (TIMOPTIC) 0.5 % ophthalmic solution      No current facility-administered medications for this visit.      Allergies:   Ambien [zolpidem tartrate]; Pravastatin sodium; Rosuvastatin calcium; Statins; and Zolpidem tartrate   Social History:  The patient  reports that he quit smoking about 28 years ago. His smoking use included cigars. He quit smokeless tobacco use about 25 years ago. His smokeless tobacco use included chew. He reports that he drinks about 0.6 oz of alcohol per week. He reports that he does not use drugs.   Family History:   family history includes Coronary artery disease in his mother and sister; Diabetes in his father and sister; Heart disease in his brother and mother; Heart disease (age of onset: 65) in his sister.    Review of Systems: Review of Systems  Constitutional: Negative.   Respiratory: Negative.   Cardiovascular: Negative.    Gastrointestinal: Negative.   Musculoskeletal: Positive for back pain.  Neurological: Negative.   Psychiatric/Behavioral: Negative.   All other systems reviewed and are negative.    PHYSICAL EXAM: VS:  BP (!) 158/100 (BP Location: Left Arm, Patient Position: Sitting, Cuff Size: Normal)   Pulse 60   Ht 6\' 1"  (1.854 m)   Wt 156 lb (70.8 kg)   BMI 20.58 kg/m  , BMI Body mass index is 20.58 kg/m.  Constitutional:  oriented to person, place, and time. No distress.  HENT:  Head: Normocephalic and atraumatic.  Eyes:  no discharge. No scleral icterus.  Neck: Normal range of motion. Neck supple. No JVD present.  Cardiovascular: Normal rate, regular rhythm, normal heart sounds and intact distal pulses. Exam reveals no gallop and no friction rub. No edema No murmur heard. Pulmonary/Chest: Effort normal and breath sounds normal. No stridor. No respiratory distress.  no wheezes.  no rales.  no tenderness.  Abdominal: Soft.  no distension.  no tenderness.  Musculoskeletal: Normal range of motion.  no  tenderness [name] deformity.  Neurological:  normal muscle tone. Coordination normal. No atrophy Skin: Skin is warm and dry. No rash noted. not diaphoretic.  Psychiatric:  normal mood and affect. behavior is normal. Thought content normal.    Recent Labs: 06/01/2017: ALT 10; BUN 17; Creat 1.04; Hemoglobin 13.7; Platelets 187; Potassium 4.2; Sodium 140; TSH 2.31    Lipid Panel [name] Results  Component Value Date   CHOL 113 06/01/2017   HDL 39 (L) 06/01/2017   LDLCALC 59 06/01/2017   TRIG 69 06/01/2017      Wt Readings from Last 3 Encounters:  06/27/17 156 lb (70.8 kg)  06/07/17 157 lb (71.2 kg)  12/06/16 166 lb 12.8 oz (75.7 kg)       ASSESSMENT AND PLAN:   Atrial fibrillation, unspecified type ([place]) - Plan: EKG 12-Lead Maintaining normal sinus rhythm Last episode in 2014, no longer on anticoagulation Denies any tachycardia [name] palpitations  Atherosclerosis of native coronary  artery of native heart without angina pectoris -  No regular exercise , denies any chest pain  Continue aggressive cholesterol management  Pure hypercholesterolemia Cholesterol is at goal on the current lipid regimen. No changes to the medications were made.  HYPERTENSION, BENIGN Blood pressure elevated but typically well controlled Recommended he monitor blood pressure at home and call our office if this continues to run high. Likely from being in the office today  Aortic atherosclerosis ([place]) Stressed importance of aggressive lipid control No further workup needed  AAA (abdominal aortic aneurysm) without rupture ([facility]) 3.2 cm by CT scan April 2017  no further imaging at this time  S/P CABG x 4 Currently with no symptoms concerning for angina  Chronic low back pain, unspecified back pain laterality, with sciatica presence unspecified Recommended back exercises Recommended he restart exercise program Currently not very active   Total encounter time more than 25 minutes  Greater than 50% was spent in counseling and coordination of care with the patient   Disposition:   F/U  12 months   Orders Placed This Encounter  Procedures  . EKG 12-Lead     Signed, Esmond Plants, M.D., Ph.D. 06/27/2017  [place], [place]

## 2017-06-27 ENCOUNTER — Encounter: Payer: Self-pay | Admitting: Cardiovascular Disease

## 2017-06-27 ENCOUNTER — Ambulatory Visit: Payer: PPO | Admitting: Cardiovascular Disease

## 2017-06-27 VITALS — BP 158/100 | HR 60 | Ht 73.0 in | Wt 156.0 lb

## 2017-06-27 DIAGNOSIS — I714 Abdominal aortic aneurysm, without rupture, unspecified: Secondary | ICD-10-CM

## 2017-06-27 DIAGNOSIS — I10 Essential (primary) hypertension: Secondary | ICD-10-CM | POA: Diagnosis not present

## 2017-06-27 DIAGNOSIS — Z951 Presence of aortocoronary bypass graft: Secondary | ICD-10-CM

## 2017-06-27 DIAGNOSIS — I480 Paroxysmal atrial fibrillation: Secondary | ICD-10-CM

## 2017-06-27 DIAGNOSIS — I700 Atherosclerosis of aorta: Secondary | ICD-10-CM

## 2017-06-27 DIAGNOSIS — E782 Mixed hyperlipidemia: Secondary | ICD-10-CM

## 2017-06-27 DIAGNOSIS — I25118 Atherosclerotic heart disease of native coronary artery with other forms of angina pectoris: Secondary | ICD-10-CM

## 2017-06-27 NOTE — Patient Instructions (Signed)
Monitor blood pressure Call if blood pressure runs high  Medication Instructions:   No medication changes made  Labwork:  No new labs needed  Testing/Procedures:  No further testing at this time   Follow-Up: It was a pleasure seeing you in the office today. Please call us if you have new issues that need to be addressed before your next appt.  [phone]  Your physician wants you to follow-up in: 12 months.  You will receive a reminder letter in the mail two months in advance. If you don't receive a letter, please call our office to schedule the follow-up appointment.  If you need a refill on your cardiac medications before your next appointment, please call your pharmacy.  For educational health videos Log in to : [email or web address] Or : [email or web address], password : [email or web address]

## 2017-07-05 DIAGNOSIS — H40153 Residual stage of open-angle glaucoma, bilateral: Secondary | ICD-10-CM | POA: Diagnosis not present

## 2017-08-07 ENCOUNTER — Other Ambulatory Visit: Payer: Self-pay | Admitting: Family Medicine

## 2017-09-07 ENCOUNTER — Other Ambulatory Visit: Payer: Self-pay | Admitting: Cardiovascular Disease

## 2017-09-12 ENCOUNTER — Other Ambulatory Visit: Payer: Self-pay | Admitting: Unknown Physician Specialty

## 2017-09-12 DIAGNOSIS — R1310 Dysphagia, unspecified: Secondary | ICD-10-CM | POA: Diagnosis not present

## 2017-09-15 ENCOUNTER — Ambulatory Visit
Admission: RE | Admit: 2017-09-15 | Discharge: 2017-09-15 | Disposition: A | Discharge status: Home / Self Care | Payer: PPO | Source: Ambulatory Visit | Attending: Unknown Physician Specialty | Admitting: Unknown Physician Specialty

## 2017-09-15 DIAGNOSIS — K222 Esophageal obstruction: Secondary | ICD-10-CM | POA: Insufficient documentation

## 2017-09-15 DIAGNOSIS — R1310 Dysphagia, unspecified: Secondary | ICD-10-CM | POA: Diagnosis not present

## 2017-09-15 DIAGNOSIS — K219 Gastro-esophageal reflux disease without esophagitis: Secondary | ICD-10-CM | POA: Diagnosis not present

## 2017-09-21 ENCOUNTER — Telehealth: Payer: Self-pay | Admitting: Family Medicine

## 2017-09-21 NOTE — Telephone Encounter (Signed)
Called to re-schedule Medicare Annual Wellness Visit with the Nurse Health Advisor.  ° °Thank you! °For any questions please contact: °Kathryn Brown [phone] or Skype at: [email or web address]  ° ° °

## 2017-09-27 DIAGNOSIS — H401121 Primary open-angle glaucoma, left eye, mild stage: Secondary | ICD-10-CM | POA: Diagnosis not present

## 2017-10-03 ENCOUNTER — Ambulatory Visit: Payer: PPO | Admitting: Gastroenterology

## 2017-10-03 ENCOUNTER — Encounter: Payer: Self-pay | Admitting: Gastroenterology

## 2017-10-03 ENCOUNTER — Other Ambulatory Visit: Payer: Self-pay

## 2017-10-03 VITALS — BP 177/91 | HR 67 | Ht 73.0 in | Wt 156.8 lb

## 2017-10-03 DIAGNOSIS — R1319 Other dysphagia: Secondary | ICD-10-CM

## 2017-10-03 DIAGNOSIS — R1310 Dysphagia, unspecified: Secondary | ICD-10-CM

## 2017-10-03 MED ORDER — PANTOPRAZOLE SODIUM 40 MG PO TBEC: 40.0000 mg | DELAYED_RELEASE_TABLET | Freq: Every day | ORAL | 3 refills | Status: DC

## 2017-10-03 NOTE — Progress Notes (Signed)
Primary Care Physician: Darrell Hauser, DO  Primary Gastroenterologist:  Dr. Lucilla [name]  Chief Complaint  Patient presents with  . Dysphagia, hoarseness    HPI: [name] is a 84 y.o. male here for dysphasia.  The patient had seen me in the past for colonoscopy.  The patient now reports that he has had some weight loss that was unexplained.  The patient also reports that he has a change in his voice with his voice becoming more hoarse.  The patient also reports his voice to be deeper.  The patient is not presently on any acid reducing medication.  The patient also had a upper GI series with a stricture seen in the distal esophagus with the 13 mm pill getting hung up in the distal esophagus.  Current Outpatient Medications  Medication Sig Dispense Refill  . Acetaminophen (TYLENOL ARTHRITIS PAIN PO) Take by mouth as needed.    [name] aspirin EC 81 MG tablet Take 81 mg by mouth every evening.    . ezetimibe (ZETIA) 10 MG tablet TAKE ONE TABLET BY MOUTH DAILY 90 tablet 3  . latanoprost (XALATAN) 0.005 % ophthalmic solution     . simvastatin (ZOCOR) 40 MG tablet TAKE 1 TABLET (40 MG TOTAL) BY MOUTH AT BEDTIME. 90 tablet 3  . testosterone cypionate (DEPOTESTOSTERONE CYPIONATE) 200 MG/ML injection Inject 0.3 mLs (60 mg total) into the muscle every 14 (fourteen) days. 10 mL 0  . timolol (TIMOPTIC) 0.5 % ophthalmic solution     . pantoprazole (PROTONIX) 40 MG tablet Take 1 tablet (40 mg total) by mouth daily. 30 tablet 3   No current facility-administered medications for this visit.     Allergies as of 10/03/2017 - Review Complete 10/03/2017  Allergen Reaction Noted  . Ambien [zolpidem tartrate] Other (See Comments) 01/25/2012  . Pravastatin sodium  12/02/2013  . Rosuvastatin calcium  05/19/2014  . Statins    . Zolpidem tartrate  10/05/2010    ROS:  General: Negative for anorexia, weight loss, fever, chills, fatigue, weakness. ENT: Negative for hoarseness, difficulty  swallowing , nasal congestion. CV: Negative for chest pain, angina, palpitations, dyspnea on exertion, peripheral edema.  Respiratory: Negative for dyspnea at rest, dyspnea on exertion, cough, sputum, wheezing.  GI: See history of present illness. GU:  Negative for dysuria, hematuria, urinary incontinence, urinary frequency, nocturnal urination.  Endo: Negative for unusual weight change.    Physical Examination:   BP (!) 177/91   Pulse 67   Ht 6\' 1"  (1.854 m)   Wt 156 lb 12.8 oz (71.1 kg)   BMI 20.69 kg/m   General: Well-nourished, well-developed in no acute distress.  Eyes: No icterus. Conjunctivae pink. Mouth: Oropharyngeal mucosa moist and pink , no lesions erythema or exudate. Lungs: Clear to auscultation bilaterally. Non-labored. Heart: Regular rate and rhythm, no murmurs rubs or gallops.  Abdomen: Bowel sounds are normal, nontender, nondistended, no hepatosplenomegaly or masses, no abdominal bruits or hernia , no rebound or guarding.   Extremities: No lower extremity edema. No clubbing or deformities. Neuro: Alert and oriented x 3.  Grossly intact. Skin: Warm and dry, no jaundice.   Psych: Alert and cooperative, normal mood and affect.  Labs:    Imaging Studies: Dg Esophagus  Result Date: 09/15/2017 CLINICAL DATA:  Dysphagia feels like food gets stuck in the throat. EXAM: ESOPHOGRAM / BARIUM SWALLOW / BARIUM TABLET STUDY TECHNIQUE: Combined double contrast and single contrast examination performed using effervescent crystals, thick barium liquid, and thin barium liquid.  The patient was observed with fluoroscopy swallowing a 13 mm barium sulphate tablet. FLUOROSCOPY TIME:  Fluoroscopy Time:  1 minutes 30 seconds Radiation Exposure Index (if provided by the fluoroscopic device): 8.9 mGy Number of Acquired Spot Images: 0 COMPARISON:  None. FINDINGS: There was normal pharyngeal anatomy and motility. Contrast flowed freely through the esophagus without evidence of a mass. There was  normal esophageal mucosa without evidence of irregularity or ulceration. Prominent cricopharyngeus impression upon the esophagus. Mild tertiary contractions of the distal third of the esophagus as can be seen with mild spasm. Mild relative narrowing of the distal esophagus just proximal to the gastroesophageal junction which restricts the passage of a 13 mm barium tablet most concerning for a mild stricture. No evidence of reflux. No definite hiatal hernia was demonstrated. IMPRESSION: 1. Mild relative narrowing of the distal esophagus just proximal to the gastroesophageal junction which restricts the passage of a 13 mm barium tablet most concerning for a mild stricture. 2. Mild tertiary contractions of the distal third of the esophagus as can be seen with mild spasm. Electronically Signed   By: Kathreen Devoid   On: 09/15/2017 11:49    Assessment and Plan:   [name] is a 84 y.o. y/o male who comes in today with a history of dysphasia.  The patient had a positive barium swallow for distal esophageal stricture with restricted passage of the 13 mm barium tablet there was also some tertiary contractions in the distal third of the esophagus and spasms.  The patient will be set up for an EGD to look for something that may be amenable to esophageal dilation.  The patient will also be started on Protonix 40 mg once a day.  I have discussed risks & benefits which include, but are not limited to, bleeding, infection, perforation & drug reaction.  The patient agrees with this plan & written consent will be obtained.       Darrell Lame, MD. Marval Regal   Note: This dictation was prepared with Dragon dictation along with smaller phrase technology. Any transcriptional errors that result from this process are unintentional.

## 2017-10-19 ENCOUNTER — Telehealth: Payer: Self-pay | Admitting: Family Medicine

## 2017-10-19 NOTE — Telephone Encounter (Signed)
Pt wants to have his glucose checked today [phone]

## 2017-10-19 NOTE — Telephone Encounter (Signed)
Advised patient to check sugar level for week and let office know the reading having BS 140 is not abnormal Dr. Raliegh Ip is aware.

## 2017-10-24 ENCOUNTER — Encounter: Payer: Self-pay | Admitting: Anesthesiology

## 2017-10-24 ENCOUNTER — Ambulatory Visit
Admission: RE | Admit: 2017-10-24 | Discharge: 2017-10-24 | Disposition: A | Discharge status: Home / Self Care | Payer: PPO | Source: Ambulatory Visit | Attending: Gastroenterology | Admitting: Gastroenterology

## 2017-10-24 ENCOUNTER — Ambulatory Visit: Payer: PPO | Admitting: Anesthesiology

## 2017-10-24 ENCOUNTER — Encounter
Admission: RE | Disposition: A | Discharge status: Home / Self Care | Payer: Self-pay | Source: Ambulatory Visit | Attending: Gastroenterology

## 2017-10-24 DIAGNOSIS — K222 Esophageal obstruction: Secondary | ICD-10-CM

## 2017-10-24 DIAGNOSIS — E0590 Thyrotoxicosis, unspecified without thyrotoxic crisis or storm: Secondary | ICD-10-CM | POA: Diagnosis not present

## 2017-10-24 DIAGNOSIS — Z87891 Personal history of nicotine dependence: Secondary | ICD-10-CM | POA: Diagnosis not present

## 2017-10-24 DIAGNOSIS — E785 Hyperlipidemia, unspecified: Secondary | ICD-10-CM | POA: Diagnosis not present

## 2017-10-24 DIAGNOSIS — R1310 Dysphagia, unspecified: Secondary | ICD-10-CM | POA: Diagnosis not present

## 2017-10-24 DIAGNOSIS — I480 Paroxysmal atrial fibrillation: Secondary | ICD-10-CM | POA: Diagnosis not present

## 2017-10-24 DIAGNOSIS — R1319 Other dysphagia: Secondary | ICD-10-CM

## 2017-10-24 DIAGNOSIS — E119 Type 2 diabetes mellitus without complications: Secondary | ICD-10-CM | POA: Diagnosis not present

## 2017-10-24 DIAGNOSIS — Z951 Presence of aortocoronary bypass graft: Secondary | ICD-10-CM | POA: Diagnosis not present

## 2017-10-24 DIAGNOSIS — Z96641 Presence of right artificial hip joint: Secondary | ICD-10-CM | POA: Insufficient documentation

## 2017-10-24 DIAGNOSIS — I2510 Atherosclerotic heart disease of native coronary artery without angina pectoris: Secondary | ICD-10-CM | POA: Insufficient documentation

## 2017-10-24 DIAGNOSIS — Z79899 Other long term (current) drug therapy: Secondary | ICD-10-CM | POA: Diagnosis not present

## 2017-10-24 DIAGNOSIS — R7989 Other specified abnormal findings of blood chemistry: Secondary | ICD-10-CM | POA: Diagnosis not present

## 2017-10-24 DIAGNOSIS — Z8546 Personal history of malignant neoplasm of prostate: Secondary | ICD-10-CM | POA: Insufficient documentation

## 2017-10-24 DIAGNOSIS — I10 Essential (primary) hypertension: Secondary | ICD-10-CM | POA: Diagnosis not present

## 2017-10-24 HISTORY — PX: ESOPHAGOGASTRODUODENOSCOPY (EGD) WITH PROPOFOL: SHX5813

## 2017-10-24 SURGERY — ESOPHAGOGASTRODUODENOSCOPY (EGD) WITH PROPOFOL
Anesthesia: General

## 2017-10-24 MED ORDER — PROPOFOL 500 MG/50ML IV EMUL
INTRAVENOUS | Status: AC
  Filled 2017-10-24: qty 50

## 2017-10-24 MED ORDER — LIDOCAINE HCL (PF) 1 % IJ SOLN
INTRAMUSCULAR | Status: AC
  Administered 2017-10-24: 0.3 mL via INTRADERMAL
  Filled 2017-10-24: qty 2

## 2017-10-24 MED ORDER — LIDOCAINE 2% (20 MG/ML) 5 ML SYRINGE
INTRAMUSCULAR | Status: DC | PRN
  Administered 2017-10-24: 100 mg via INTRAVENOUS

## 2017-10-24 MED ORDER — LIDOCAINE HCL (PF) 2 % IJ SOLN
INTRAMUSCULAR | Status: AC
  Filled 2017-10-24: qty 10

## 2017-10-24 MED ORDER — PROPOFOL 10 MG/ML IV BOLUS
INTRAVENOUS | Status: DC | PRN
  Administered 2017-10-24: 70 mg via INTRAVENOUS

## 2017-10-24 MED ORDER — PROPOFOL 500 MG/50ML IV EMUL
INTRAVENOUS | Status: DC | PRN
  Administered 2017-10-24: 200 ug/kg/min via INTRAVENOUS

## 2017-10-24 MED ORDER — LIDOCAINE HCL (PF) 1 % IJ SOLN
2.0000 mL | Freq: Once | INTRAMUSCULAR | Status: AC
  Administered 2017-10-24: 0.3 mL via INTRADERMAL

## 2017-10-24 MED ORDER — SODIUM CHLORIDE 0.9 % IV SOLN
INTRAVENOUS | Status: DC
  Administered 2017-10-24: 1000 mL via INTRAVENOUS

## 2017-10-24 MED ORDER — GLYCOPYRROLATE 0.2 MG/ML IJ SOLN
INTRAMUSCULAR | Status: DC | PRN
  Administered 2017-10-24: 0.2 mg via INTRAVENOUS

## 2017-10-24 MED ORDER — GLYCOPYRROLATE 0.2 MG/ML IJ SOLN
INTRAMUSCULAR | Status: AC
  Filled 2017-10-24: qty 1

## 2017-10-24 NOTE — Transfer of Care (Signed)
Immediate Anesthesia Transfer of Care Note  Patient: [name]  Procedure(s) Performed: ESOPHAGOGASTRODUODENOSCOPY (EGD) WITH PROPOFOL (N/A )  Patient Location: Endoscopy Unit  Anesthesia Type:General  Level of Consciousness: sedated  Airway & Oxygen Therapy: Patient connected to nasal cannula oxygen  Post-op Assessment: Post -op Vital signs reviewed and stable  Post vital signs: stable  Last Vitals:  Vitals Value Taken Time  BP 147/76 10/24/2017  8:51 AM  Temp    Pulse 64 10/24/2017  8:52 AM  Resp 7 10/24/2017  8:52 AM  SpO2 100 % 10/24/2017  8:52 AM  Vitals shown include unvalidated device data.  Last Pain:  Vitals:   10/24/17 0732  PainSc: 0-No pain         Complications: No apparent anesthesia complications

## 2017-10-24 NOTE — Op Note (Signed)
[facility] Patient Name: [name] Procedure Date: 10/24/2017 8:34 AM MRN: [id number] Account #: [id number] Date of Birth: 10-23-1932 Admit Type: Outpatient Age: 84 Room: [facility] ENDO ROOM 4 Gender: Male Note Status: Finalized Procedure:            Upper GI endoscopy Indications:          Dysphagia Providers:            [name] MD, MD Referring MD:         [name] (Referring MD) Medicines:            Propofol per Anesthesia Complications:        No immediate complications. Procedure:            Pre-Anesthesia Assessment:                       - Prior to the procedure, a History and Physical was                        performed, and patient medications and allergies were                        reviewed. The patient's tolerance of previous                        anesthesia was also reviewed. The risks and benefits of                        the procedure and the sedation options and risks were                        discussed with the patient. All questions were                        answered, and informed consent was obtained. Prior                        Anticoagulants: The patient has taken no previous                        anticoagulant or antiplatelet agents. ASA Grade                        Assessment: II - A patient with mild systemic disease.                        After reviewing the risks and benefits, the patient was                        deemed in satisfactory condition to undergo the                        procedure.                       After obtaining informed consent, the endoscope was                        passed under direct vision. Throughout the procedure,  the patient's blood pressure, pulse, and oxygen                        saturations were monitored continuously. The Endoscope                        was introduced through the mouth, and advanced to the   second part of duodenum. The upper GI endoscopy was                        accomplished without difficulty. The patient tolerated                        the procedure well. Findings:      One benign-appearing, intrinsic mild stenosis was found at the       gastroesophageal junction. The stenosis was traversed. A TTS dilator was       passed through the scope. Dilation with a 15-16.5-18 mm balloon dilator       was performed to 18 mm. The dilation site was examined following       endoscope reinsertion and showed complete resolution of luminal       narrowing.      The stomach was normal.      The examined duodenum was normal. Impression:           - Benign-appearing esophageal stenosis. Dilated.                       - Normal stomach.                       - Normal examined duodenum.                       - No specimens collected. Recommendation:       - Discharge patient to home.                       - Resume previous diet.                       - Continue present medications. Procedure Code(s):    --- Professional ---                       [id number], Esophagogastroduodenoscopy, flexible, transoral;                        with transendoscopic balloon dilation of esophagus                        (less than 30 mm diameter) Diagnosis Code(s):    --- Professional ---                       R13.10, Dysphagia, unspecified                       K22.2, Esophageal obstruction CPT copyright 2018 American Medical Association. All rights reserved. The codes documented in this report are preliminary and upon coder review may  be revised to meet current compliance requirements. [name] MD, MD 10/24/2017 8:48:12 AM This report has been signed electronically. Number of Addenda: 0 Note Initiated On: 10/24/2017 8:34 AM  [facility]

## 2017-10-24 NOTE — Anesthesia Postprocedure Evaluation (Signed)
Anesthesia Post Note  Patient: [name]  Procedure(s) Performed: ESOPHAGOGASTRODUODENOSCOPY (EGD) WITH PROPOFOL (N/A )  Patient location during evaluation: Endoscopy Anesthesia Type: General Level of consciousness: awake and alert Pain management: pain level controlled Vital Signs Assessment: post-procedure vital signs reviewed and stable Respiratory status: spontaneous breathing, nonlabored ventilation, respiratory function stable and patient connected to nasal cannula oxygen Cardiovascular status: blood pressure returned to baseline and stable Postop Assessment: no apparent nausea or vomiting Anesthetic complications: no     Last Vitals:  Vitals:   10/24/17 0902 10/24/17 0913  BP: (!) 160/78 (!) 162/83  Pulse: (!) 57 (!) 56  Resp: 16 16  Temp:    SpO2: 100% 100%    Last Pain:  Vitals:   10/24/17 0913  TempSrc:   PainSc: 0-No pain                 Precious Haws Chloe Flis

## 2017-10-24 NOTE — Anesthesia Post-op Follow-up Note (Signed)
Anesthesia QCDR form completed.        

## 2017-10-24 NOTE — Anesthesia Preprocedure Evaluation (Signed)
Anesthesia Evaluation  Patient identified by MRN, date of birth, ID band Patient awake    Reviewed: Allergy & Precautions, H&P , NPO status , Patient's Chart, lab work & pertinent test results  History of Anesthesia Complications Negative for: history of anesthetic complications  Airway Mallampati: III  TM Distance: >3 FB Neck ROM: limited    Dental  (+) Chipped, Poor Dentition, Missing   Pulmonary neg shortness of breath, former smoker,           Cardiovascular Exercise Tolerance: Good hypertension, (-) angina+ CAD, + Cardiac Stents and + CABG  (-) DOE      Neuro/Psych negative neurological ROS  negative psych ROS   GI/Hepatic negative GI ROS, Neg liver ROS, neg GERD  ,  Endo/Other  diabetesHyperthyroidism   Renal/GU negative Renal ROS  negative genitourinary   Musculoskeletal  (+) Arthritis ,   Abdominal   Peds  Hematology negative hematology ROS (+)   Anesthesia Other Findings Past Medical History: No date: Arthritis No date: Coronary artery disease     Comment:  a. 1993 s/p PCI/BMS LAD;  b. 2003 PCI RCA;  c. 09/2006               ISR LAD->PCI, PCI RCA;  d. 02/2007 MV: no ischemia;  d.               01/2012 MV: EF 74%, no ischemia. No date: History of prostate cancer No date: Hyperlipidemia No date: Hyperthyroidism No date: Low testosterone No date: PAF (paroxysmal atrial fibrillation) (HCC)     Comment:  a. previously on amio ->stopped in 2013;  b. recurrent               PAF 01/2012->Pradaxa and amio initiated, subsequently came              off of pradaxa 2/2 cost;  c. 02/2012 Echo: EF 55-60%, Gr 1              DD. No date: Prostate cancer ([place]) No date: S/P CABG x 4  Past Surgical History: No date: APPENDECTOMY     Comment:  Dr. Bary Castilla 2008: CARDIAC CATHETERIZATION 05/18/2012: CLIPPING OF ATRIAL APPENDAGE; Left     Comment:  Procedure: CLIPPING OF ATRIAL APPENDAGE;  Surgeon: [name], MD;  Location: [place];  Service: Open Heart               Surgery;  Laterality: Left; 2013: COLONOSCOPY 07/12/2016: COLONOSCOPY WITH PROPOFOL; N/A     Comment:  Procedure: COLONOSCOPY WITH PROPOFOL;  Surgeon: [name], MD;  Location: [facility] ENDOSCOPY;  Service:               Endoscopy;  Laterality: N/A; 05/18/2012: CORONARY ARTERY BYPASS GRAFT; N/A     Comment:  Procedure: CORONARY ARTERY BYPASS GRAFTING (CABG);                Surgeon: [name], MD;  Location: [place];  Service:              Open Heart Surgery;  Laterality: N/A;  Times 4 using left              internal mammary artery and endoscopically harvested               right saphenous vein No date:  HERNIA REPAIR     Comment:  s/p mesh bilaterally, Dr. Bary Castilla 05/18/2012: INTRAOPERATIVE TRANSESOPHAGEAL ECHOCARDIOGRAM; N/A     Comment:  Procedure: INTRAOPERATIVE TRANSESOPHAGEAL               ECHOCARDIOGRAM;  Surgeon: [name], MD;  Location:              [place];  Service: Open Heart Surgery;  Laterality: N/A; 05/16/2012: LEFT HEART CATHETERIZATION WITH CORONARY ANGIOGRAM;  Bilateral     Comment:  Procedure: LEFT HEART CATHETERIZATION WITH CORONARY               ANGIOGRAM;  Surgeon: Burnell Blanks, MD;                Location: [facility] CATH LAB;  Service: Cardiovascular;                Laterality: Bilateral; 05/18/2012: MAZE; N/A     Comment:  Procedure: MAZE;  Surgeon: [name], MD;                Location: [place];  Service: Open Heart Surgery;                Laterality: N/A; No date: MOHS SURGERY No date: PROSTATECTOMY No date: TOTAL HIP ARTHROPLASTY; Right     Comment:  Dr. [name]  BMI    Body Mass Index:  21.11 kg/m      Reproductive/Obstetrics negative OB ROS                             Anesthesia Physical Anesthesia Plan  ASA: III  Anesthesia Plan: General   Post-op Pain Management:    Induction: Intravenous  PONV Risk Score and Plan: Propofol  infusion and TIVA  Airway Management Planned: Natural Airway and Nasal Cannula  Additional Equipment:   Intra-op Plan:   Post-operative Plan:   Informed Consent: I have reviewed the patients History and Physical, chart, labs and discussed the procedure including the risks, benefits and alternatives for the proposed anesthesia with the patient or authorized representative who has indicated his/her understanding and acceptance.   Dental Advisory Given  Plan Discussed with: Anesthesiologist, CRNA and Surgeon  Anesthesia Plan Comments: (Patient consented for risks of anesthesia including but not limited to:  - adverse reactions to medications - risk of intubation if required - damage to teeth, lips or other oral mucosa - sore throat or hoarseness - Damage to heart, brain, lungs or loss of life  Patient voiced understanding.)        Anesthesia Quick Evaluation

## 2017-10-24 NOTE — H&P (Signed)
[name], MD [facility] [address], [place], [place] [address] [phone] Fax : [phone]  Primary Care Physician:  [name], DO Primary Gastroenterologist:  Dr. Allen Norris  Pre-Procedure History & Physical: HPI:  [name] is a 84 y.o. male is here for an endoscopy.   Past Medical History:  Diagnosis Date  . Arthritis   . Coronary artery disease    a. 1993 s/p PCI/BMS LAD;  b. 2003 PCI RCA;  c. 09/2006 ISR LAD->PCI, PCI RCA;  d. 02/2007 MV: no ischemia;  d. 01/2012 MV: EF 74%, no ischemia.  [name] History of prostate cancer   . Hyperlipidemia   . Hyperthyroidism   . Low testosterone   . PAF (paroxysmal atrial fibrillation) ([place])    a. previously on amio ->stopped in 2013;  b. recurrent PAF 01/2012->Pradaxa and amio initiated, subsequently came off of pradaxa 2/2 cost;  c. 02/2012 Echo: EF 55-60%, Gr 1 DD.  [name] Prostate cancer ([place])   . S/P CABG x 4     Past Surgical History:  Procedure Laterality Date  . APPENDECTOMY     Dr. Bary Castilla  . CARDIAC CATHETERIZATION  2008  . CLIPPING OF ATRIAL APPENDAGE Left 05/18/2012   Procedure: CLIPPING OF ATRIAL APPENDAGE;  Surgeon: [name], MD;  Location: [place];  Service: Open Heart Surgery;  Laterality: Left;  . COLONOSCOPY  2013  . COLONOSCOPY WITH PROPOFOL N/A 07/12/2016   Procedure: COLONOSCOPY WITH PROPOFOL;  Surgeon: [name], MD;  Location: [facility] ENDOSCOPY;  Service: Endoscopy;  Laterality: N/A;  . CORONARY ARTERY BYPASS GRAFT N/A 05/18/2012   Procedure: CORONARY ARTERY BYPASS GRAFTING (CABG);  Surgeon: [name], MD;  Location: [place];  Service: Open Heart Surgery;  Laterality: N/A;  Times 4 using left internal mammary artery and endoscopically harvested right saphenous vein  . HERNIA REPAIR     s/p mesh bilaterally, Dr. Bary Castilla  . INTRAOPERATIVE TRANSESOPHAGEAL ECHOCARDIOGRAM N/A 05/18/2012   Procedure: INTRAOPERATIVE TRANSESOPHAGEAL ECHOCARDIOGRAM;  Surgeon: [name], MD;  Location: [place];   Service: Open Heart Surgery;  Laterality: N/A;  . LEFT HEART CATHETERIZATION WITH CORONARY ANGIOGRAM Bilateral 05/16/2012   Procedure: LEFT HEART CATHETERIZATION WITH CORONARY ANGIOGRAM;  Surgeon: Burnell Blanks, MD;  Location: [facility] CATH LAB;  Service: Cardiovascular;  Laterality: Bilateral;  . MAZE N/A 05/18/2012   Procedure: MAZE;  Surgeon: [name], MD;  Location: [place];  Service: Open Heart Surgery;  Laterality: N/A;  . MOHS SURGERY    . PROSTATECTOMY    . TOTAL HIP ARTHROPLASTY Right    Dr. [name]    Prior to Admission medications   Medication Sig Start Date End Date Taking? Authorizing Provider  Acetaminophen (TYLENOL ARTHRITIS PAIN PO) Take by mouth as needed.    [provider]  aspirin EC 81 MG tablet Take 81 mg by mouth every evening.    [provider]  ezetimibe (ZETIA) 10 MG tablet TAKE ONE TABLET BY MOUTH DAILY 09/07/17   Minna Merritts, MD  latanoprost (XALATAN) 0.005 % ophthalmic solution  05/24/16   [provider]  pantoprazole (PROTONIX) 40 MG tablet Take 1 tablet (40 mg total) by mouth daily. 10/03/17   [name], MD  simvastatin (ZOCOR) 40 MG tablet TAKE 1 TABLET (40 MG TOTAL) BY MOUTH AT BEDTIME. 06/20/16   Gollan, Kathlene November, MD  testosterone cypionate (DEPOTESTOSTERONE CYPIONATE) 200 MG/ML injection Inject 0.3 mLs (60 mg total) into the muscle every 14 (fourteen) days. 11/03/16  Stoioff, Ronda Fairly, MD  timolol (TIMOPTIC) 0.5 % ophthalmic solution  05/04/17   [provider]    Allergies as of 10/04/2017 - Review Complete 10/03/2017  Allergen Reaction Noted  . Ambien [zolpidem tartrate] Other (See Comments) 01/25/2012  . Pravastatin sodium  12/02/2013  . Rosuvastatin calcium  05/19/2014  . Statins    . Zolpidem tartrate  10/05/2010    Family History  Problem Relation Age of Onset  . Coronary artery disease Mother   . Heart disease Mother   . Coronary artery disease Sister   . Diabetes Sister   . Heart disease  Sister 28       deceased  . Diabetes Father   . Heart disease Brother     Social History   Socioeconomic History  . Marital status: Married    Spouse name: [name] Hucks  . Number of children: Not on file  . Years of education: JD  . Highest education level: Not on file  Occupational History  . 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 Occupation: Former Press photographer  . Financial resource strain: Not hard at all  . Food insecurity:    Worry: Never true    Inability: Never true  . Transportation needs:    Medical: No    Non-medical: No  Tobacco Use  . Smoking status: Former Smoker    Types: Cigars    Last attempt to quit: 10/04/1988    Years since quitting: 29.0  . Smokeless tobacco: Former Systems developer    Types: [place] date: 09/04/1991  Substance and Sexual Activity  . Alcohol use: Yes    Alcohol/week: 1.0 standard drinks    Types: 1 Glasses of wine per week    Comment: occassionaly  . Drug use: No  . Sexual activity: Not Currently  Lifestyle  . Physical activity:    Days per week: 0 days    Minutes per session: 0 min  . Stress: Not at all  Relationships  . Social connections:    Talks on phone: Once a week    Gets together: Once a week    Attends religious service: More than 4 times per year    Active member of club or organization: No    Attends meetings of clubs or organizations: Never    Relationship status: Married  . Intimate partner violence:    Fear of current or ex partner: No    Emotionally abused: No    Physically abused: No    Forced sexual activity: No  Other Topics Concern  . Not on file  Social History Narrative   Lives in [place] with wife. He was born in [place] and went to [place] in [place]. Daughter in [place], 2 step-daughters. Retired from [employer].    Review of Systems: See HPI, otherwise negative ROS  Physical Exam: BP (!) 173/93   Pulse 61   Temp (!) 97.2 F  (36.2 C)   Resp 18   Ht 6\' 1"  (1.854 m)   Wt 72.6 kg   SpO2 100%   BMI 21.11 kg/m  General:   Alert,  pleasant and cooperative in NAD Head:  Normocephalic and atraumatic. Neck:  Supple; no masses or thyromegaly. Lungs:  Clear throughout to auscultation.    Heart:  Regular rate and rhythm. Abdomen:  Soft, nontender and nondistended. Normal bowel sounds, without guarding, and without rebound.   Neurologic:  Alert and  oriented x4;  grossly normal neurologically.  Impression/Plan: [name] is here for an endoscopy to be performed for dyphagia  Risks, benefits, limitations, and alternatives regarding  endoscopy have been reviewed with the patient.  Questions have been answered.  All parties agreeable.   [name], MD  10/24/2017, 8:29 AM

## 2017-10-31 ENCOUNTER — Encounter: Payer: Self-pay | Admitting: Urology

## 2017-10-31 ENCOUNTER — Ambulatory Visit: Payer: PPO | Admitting: Urology

## 2017-10-31 VITALS — BP 148/83 | HR 74 | Ht 73.0 in | Wt 157.4 lb

## 2017-10-31 DIAGNOSIS — Z8546 Personal history of malignant neoplasm of prostate: Secondary | ICD-10-CM | POA: Diagnosis not present

## 2017-10-31 DIAGNOSIS — E291 Testicular hypofunction: Secondary | ICD-10-CM

## 2017-10-31 MED ORDER — TESTOSTERONE CYPIONATE 200 MG/ML IM SOLN: 60.0000 mg | INTRAMUSCULAR | 0 refills | Status: DC

## 2017-10-31 NOTE — Progress Notes (Signed)
10/31/2017 2:14 PM   [name] 1932/06/29 [id number]  Referring provider: [name], DO [address] [place], [place] [address]  Chief Complaint  Patient presents with  . Follow-up   Urologic history:  1.  History prostate cancer low risk -Radical prostatectomy 2005; PSAs remain undetectable  2.  Symptomatic hypogonadism -TRT started 2722  HPI: 84 year old male presents for follow-up of hypogonadism.  He was started on TRT 2013 for symptoms of excessive sweating and flushing.  The symptoms resolved on TRT.  He is injecting a low dose of 60 mg testosterone cypionate every 2 weeks.  PSA April 2019 remained undetectable.  Testosterone level was 559 and hematocrit and hematocrit normal at 42.8   PMH: Past Medical History:  Diagnosis Date  . Arthritis   . Coronary artery disease    a. 1993 s/p PCI/BMS LAD;  b. 2003 PCI RCA;  c. 09/2006 ISR LAD->PCI, PCI RCA;  d. 02/2007 MV: no ischemia;  d. 01/2012 MV: EF 74%, no ischemia.  [name] History of prostate cancer   . Hyperlipidemia   . Hyperthyroidism   . Low testosterone   . PAF (paroxysmal atrial fibrillation) ([place])    a. previously on amio ->stopped in 2013;  b. recurrent PAF 01/2012->Pradaxa and amio initiated, subsequently came off of pradaxa 2/2 cost;  c. 02/2012 Echo: EF 55-60%, Gr 1 DD.  [name] Prostate cancer ([place])   . S/P CABG x 4     Surgical History: Past Surgical History:  Procedure Laterality Date  . APPENDECTOMY     Dr. Bary Castilla  . CARDIAC CATHETERIZATION  2008  . CLIPPING OF ATRIAL APPENDAGE Left 05/18/2012   Procedure: CLIPPING OF ATRIAL APPENDAGE;  Surgeon: [name], MD;  Location: [facility];  Service: Open Heart Surgery;  Laterality: Left;  . COLONOSCOPY  2013  . COLONOSCOPY WITH PROPOFOL N/A 07/12/2016   Procedure: COLONOSCOPY WITH PROPOFOL;  Surgeon: [name], MD;  Location: [facility] ENDOSCOPY;  Service: Endoscopy;  Laterality: N/A;  . CORONARY ARTERY BYPASS GRAFT N/A 05/18/2012   Procedure: CORONARY ARTERY  BYPASS GRAFTING (CABG);  Surgeon: [name], MD;  Location: [facility];  Service: Open Heart Surgery;  Laterality: N/A;  Times 4 using left internal mammary artery and endoscopically harvested right saphenous vein  . ESOPHAGOGASTRODUODENOSCOPY (EGD) WITH PROPOFOL N/A 10/24/2017   Procedure: ESOPHAGOGASTRODUODENOSCOPY (EGD) WITH PROPOFOL;  Surgeon: [name], MD;  Location[facility] ENDOSCOPY;  Service: Endoscopy;  Laterality: N/A;  . HERNIA REPAIR     s/p mesh bilaterally, Dr. Bary Castilla  . INTRAOPERATIVE TRANSESOPHAGEAL ECHOCARDIOGRAM N/A 05/18/2012   Procedure: INTRAOPERATIVE TRANSESOPHAGEAL ECHOCARDIOGRAM;  Surgeon: [name], MD;  Location: [facility];  Service: Open Heart Surgery;  Laterality: N/A;  . LEFT HEART CATHETERIZATION WITH CORONARY ANGIOGRAM Bilateral 05/16/2012   Procedure: LEFT HEART CATHETERIZATION WITH CORONARY ANGIOGRAM;  Surgeon: Burnell Blanks, MD;  Location: [facility] CATH LAB;  Service: Cardiovascular;  Laterality: Bilateral;  . MAZE N/A 05/18/2012   Procedure: MAZE;  Surgeon: [name], MD;  Location: [facility];  Service: Open Heart Surgery;  Laterality: N/A;  . MOHS SURGERY    . PROSTATECTOMY    . TOTAL HIP ARTHROPLASTY Right    Dr. [name]    Home Medications:  Allergies as of 10/31/2017      Reactions   Ambien [zolpidem Tartrate] Other (See Comments)   Super sensitive.    Pravastatin Sodium    REACTION: muscle aches...crestor   Rosuvastatin Calcium    REACTION: muscle aches...crestor  Statins    REACTION: muscle aches...crestor   Zolpidem Tartrate       Medication List        Accurate as of 10/31/17  2:14 PM. Always use your most recent med list.          aspirin EC 81 MG tablet Take 81 mg by mouth every evening.   ezetimibe 10 MG tablet Commonly known as:  ZETIA TAKE ONE TABLET BY MOUTH DAILY   ipratropium 0.06 % nasal spray Commonly known as:  ATROVENT   latanoprost 0.005 % ophthalmic solution Commonly known as:  XALATAN   pantoprazole 40 MG  tablet Commonly known as:  PROTONIX Take 1 tablet (40 mg total) by mouth daily.   simvastatin 40 MG tablet Commonly known as:  ZOCOR TAKE 1 TABLET (40 MG TOTAL) BY MOUTH AT BEDTIME.   testosterone cypionate 200 MG/ML injection Commonly known as:  DEPOTESTOSTERONE CYPIONATE Inject 0.3 mLs (60 mg total) into the muscle every 14 (fourteen) days.   timolol 0.5 % ophthalmic solution Commonly known as:  TIMOPTIC   TYLENOL ARTHRITIS PAIN PO Take by mouth as needed.       Allergies:  Allergies  Allergen Reactions  . Ambien [Zolpidem Tartrate] Other (See Comments)    Super sensitive.   . Pravastatin Sodium     REACTION: muscle aches...crestor  . Rosuvastatin Calcium     REACTION: muscle aches...crestor  . Statins     REACTION: muscle aches...crestor  . Zolpidem Tartrate     Family History: Family History  Problem Relation Age of Onset  . Coronary artery disease Mother   . Heart disease Mother   . Coronary artery disease Sister   . Diabetes Sister   . Heart disease Sister 71       deceased  . Diabetes Father   . Heart disease Brother     Social History:  reports that he quit smoking about 29 years ago. His smoking use included cigars. He quit smokeless tobacco use about 26 years ago.  His smokeless tobacco use included chew. He reports that he drinks about 1.0 standard drinks of alcohol per week. He reports that he does not use drugs.  ROS: UROLOGY Frequent Urination?: No Hard to postpone urination?: No Burning/pain with urination?: No Get up at night to urinate?: No Leakage of urine?: No Urine stream starts and stops?: No Trouble starting stream?: No Do you have to strain to urinate?: No Blood in urine?: No Urinary tract infection?: No Sexually transmitted disease?: No Injury to kidneys or bladder?: No Painful intercourse?: No Weak stream?: No Erection problems?: No Penile pain?: No  Gastrointestinal Nausea?: No Vomiting?: No Indigestion/heartburn?:  No Diarrhea?: No Constipation?: No  Constitutional Fever: No Night sweats?: No Weight loss?: Yes Fatigue?: No  Skin Skin rash/lesions?: No Itching?: No  Eyes Blurred vision?: Yes Double vision?: No  Ears/Nose/Throat Sore throat?: No Sinus problems?: No  Hematologic/Lymphatic Swollen glands?: No Easy bruising?: No  Cardiovascular Leg swelling?: No Chest pain?: No  Respiratory Cough?: No Shortness of breath?: No  Endocrine Excessive thirst?: No  Musculoskeletal Back pain?: Yes Joint pain?: No  Neurological Headaches?: No Dizziness?: No  Psychologic Depression?: No Anxiety?: No  Physical Exam: BP (!) 148/83 (BP Location: Left Arm, Patient Position: Sitting, Cuff Size: Normal)   Pulse 74   Ht 6\' 1"  (1.854 m)   Wt 157 lb 6.4 oz (71.4 kg)   BMI 20.77 kg/m   Constitutional:  Alert and oriented, No acute distress. HEENT: [place] AT,  moist mucus membranes.  Trachea midline, no masses. Cardiovascular: No clubbing, cyanosis, or edema. Respiratory: Normal respiratory effort, no increased work of breathing. GI: Abdomen is soft, nontender, nondistended, no abdominal masses GU: No CVA tenderness Lymph: No cervical or inguinal lymphadenopathy. Skin: No rashes, bruises or suspicious lesions. Neurologic: Grossly intact, no focal deficits, moving all 4 extremities. Psychiatric: Normal mood and affect.   Assessment & Plan:   84 year old male on TRT with stable symptoms.  Monitoring blood work has been normal and stable.  We will see him back April 2020 to coincide his visit with lab work.  Since his labs have been stable and he is on a relatively low dose of testosterone will do blood work annually.   Abbie Sons, [place] [address], [place], [place] [address] [phone]

## 2017-11-01 DIAGNOSIS — H40153 Residual stage of open-angle glaucoma, bilateral: Secondary | ICD-10-CM | POA: Diagnosis not present

## 2017-12-05 ENCOUNTER — Ambulatory Visit: Payer: Self-pay

## 2017-12-13 ENCOUNTER — Ambulatory Visit: Payer: Self-pay | Admitting: Family Medicine

## 2017-12-13 ENCOUNTER — Ambulatory Visit: Payer: Self-pay

## 2017-12-19 ENCOUNTER — Other Ambulatory Visit: Payer: Self-pay | Admitting: Family Medicine

## 2017-12-19 ENCOUNTER — Ambulatory Visit (INDEPENDENT_AMBULATORY_CARE_PROVIDER_SITE_OTHER): Payer: PPO | Admitting: Family Medicine

## 2017-12-19 ENCOUNTER — Ambulatory Visit (INDEPENDENT_AMBULATORY_CARE_PROVIDER_SITE_OTHER): Payer: PPO

## 2017-12-19 ENCOUNTER — Encounter: Payer: Self-pay | Admitting: Family Medicine

## 2017-12-19 VITALS — BP 134/72 | HR 68 | Temp 98.6°F | Ht 73.0 in | Wt 160.4 lb

## 2017-12-19 VITALS — BP 134/72 | HR 68 | Temp 98.6°F | Resp 16 | Ht 73.0 in | Wt 160.4 lb

## 2017-12-19 DIAGNOSIS — M159 Polyosteoarthritis, unspecified: Secondary | ICD-10-CM

## 2017-12-19 DIAGNOSIS — R1319 Other dysphagia: Secondary | ICD-10-CM

## 2017-12-19 DIAGNOSIS — E782 Mixed hyperlipidemia: Secondary | ICD-10-CM

## 2017-12-19 DIAGNOSIS — G8929 Other chronic pain: Secondary | ICD-10-CM | POA: Diagnosis not present

## 2017-12-19 DIAGNOSIS — E559 Vitamin D deficiency, unspecified: Secondary | ICD-10-CM

## 2017-12-19 DIAGNOSIS — I714 Abdominal aortic aneurysm, without rupture, unspecified: Secondary | ICD-10-CM

## 2017-12-19 DIAGNOSIS — K219 Gastro-esophageal reflux disease without esophagitis: Secondary | ICD-10-CM

## 2017-12-19 DIAGNOSIS — R1310 Dysphagia, unspecified: Secondary | ICD-10-CM

## 2017-12-19 DIAGNOSIS — M150 Primary generalized (osteo)arthritis: Secondary | ICD-10-CM | POA: Diagnosis not present

## 2017-12-19 DIAGNOSIS — I2510 Atherosclerotic heart disease of native coronary artery without angina pectoris: Secondary | ICD-10-CM

## 2017-12-19 DIAGNOSIS — R7303 Prediabetes: Secondary | ICD-10-CM

## 2017-12-19 DIAGNOSIS — Z0000 Encounter for general adult medical examination without abnormal findings: Secondary | ICD-10-CM

## 2017-12-19 DIAGNOSIS — M545 Low back pain: Secondary | ICD-10-CM

## 2017-12-19 DIAGNOSIS — Z8679 Personal history of other diseases of the circulatory system: Secondary | ICD-10-CM

## 2017-12-19 DIAGNOSIS — I10 Essential (primary) hypertension: Secondary | ICD-10-CM

## 2017-12-19 DIAGNOSIS — F5101 Primary insomnia: Secondary | ICD-10-CM

## 2017-12-19 DIAGNOSIS — R351 Nocturia: Secondary | ICD-10-CM

## 2017-12-19 LAB — POCT GLYCOSYLATED HEMOGLOBIN (HGB A1C): Hemoglobin A1C: 5.6 % (ref 4.0–5.6)

## 2017-12-19 MED ORDER — BACLOFEN 10 MG PO TABS: 5.0000 mg | ORAL_TABLET | Freq: Three times a day (TID) | ORAL | 2 refills | Status: DC | PRN

## 2017-12-19 MED ORDER — PANTOPRAZOLE SODIUM 40 MG PO TBEC: 40.0000 mg | DELAYED_RELEASE_TABLET | Freq: Every day | ORAL | 1 refills | Status: DC

## 2017-12-19 NOTE — Progress Notes (Signed)
Subjective:    Patient ID: [name], male    DOB: 06/06/1932, 84 y.o.   MRN: [id number]  [name] is a 84 y.o. male presenting on 12/19/2017 for prediabetes (follow up)  He has already seen Eliezer Lofts, LPN today for AMW.  History provided by both patient, and wife, [name] Hucks.  HPI   Pre-Diabetes Reports history of A1c up to 5.7 to 5.9, - now recent readings in past 1 year have been stable including today A1c 5.6 CBGs: not checking Meds: none Currently not on ACEi / ARB Lifestyle: - Diet (limited sugars, but not following strict low carb diet)  - Exercise (continues to exercise regularly at gym few days a week, mostly lower leg and strengthening, walking) Denies hypoglycemia, polyuria, visual changes, numbness or tingling.  GERD / Dysphagia w/ esophageal stricture - He had recent history of esophageal stricture on barium swallow done in 09/2017. He had EGD and esophageal stretching in 10/2017 by Dr Allen Norris with improvement initially then seemed less improved. He was rx Pantoprazole 40mg  daily with again some improve at first, now only marginal improvement. Still has some choking or dysphagia, but has not had regurgitation, they want to wait before repeat procedure or other intervention has not returned in follow-up.  Chronic Osteoarthritis, Back Pain Follow-up on chronic issue, he has tried some Tylenol Arthritis str 1-2 times a day with mild relief, not taking higher doses, he stopped baclofen previously due to swallowing issues. Interested to try again. Denies any injury, fall, numbness tingling weakness  Health Maintenance: UTD Flu vaccine already 09/2017  Depression screen [facility] 2/9 12/19/2017 06/07/2017 12/06/2016  Decreased Interest 0 1 0  Down, Depressed, Hopeless 0 0 0  PHQ - 2 Score 0 1 0  Difficult doing work/chores - Somewhat difficult -    Social History   Tobacco Use  . Smoking status: Former Smoker    Types: Cigars    Last attempt to quit:  10/04/1988    Years since quitting: 29.2  . Smokeless tobacco: Former Systems developer    Types: Chew    Quit date: 09/04/1991  Substance Use Topics  . Alcohol use: Yes    Alcohol/week: 7.0 standard drinks    Types: 7 Glasses of wine per week  . Drug use: No    Review of Systems Per HPI unless specifically indicated above     Objective:    BP 134/72   Pulse 68   Temp 98.6 F (37 C) (Oral)   Resp 16   Ht 6\' 1"  (1.854 m)   Wt 160 lb 6.4 oz (72.8 kg)   BMI 21.16 kg/m   Wt Readings from Last 3 Encounters:  12/19/17 160 lb 6.4 oz (72.8 kg)  12/19/17 160 lb 6.4 oz (72.8 kg)  10/31/17 157 lb 6.4 oz (71.4 kg)    Physical Exam  Constitutional: He is oriented to person, place, and time. He appears well-developed and well-nourished. No distress.  Well-appearing but thin 84 year old male, comfortable, cooperative  HENT:  Head: Normocephalic and atraumatic.  Mouth/Throat: Oropharynx is clear and moist.  Eyes: Conjunctivae are normal. Right eye exhibits no discharge. Left eye exhibits no discharge.  Neck: Normal range of motion. Neck supple. No thyromegaly present.  Cardiovascular: Normal rate, regular rhythm, normal heart sounds and intact distal pulses.  No murmur heard. Pulmonary/Chest: Effort normal and breath sounds normal. No respiratory distress. He has no wheezes. He has no rales.  Musculoskeletal: Normal range of motion. He exhibits  no edema.  Mid / Low Back Inspection: Normal appearance, thin body habitus, no spinal deformity, symmetrical. Palpation: No tenderness over spinous processes. Localized area of mild tender over R mid thoracic paraspinal muscles with some hypertonicity  ROM: Full active ROM forward flex / back extension, rotation L/R without discomfort Special Testing: Seated SLR negative for radicular pain bilaterally  Strength: Bilateral hip flex/ext 5/5, knee flex/ext 5/5, ankle dorsiflex/plantarflex 5/5 Neurovascular: intact distal sensation to light touch     Lymphadenopathy:    He has no cervical adenopathy.  Neurological: He is alert and oriented to person, place, and time.  Skin: Skin is warm and dry. No rash noted. He is not diaphoretic. No erythema.  Psychiatric: He has a normal mood and affect. His behavior is normal.  Well groomed, good eye contact, normal speech and thoughts  Nursing note and vitals reviewed.    Recent Labs    06/01/17 0932 12/19/17 1405  HGBA1C 5.6 5.6    Results for orders placed or performed in visit on 12/19/17  POCT HgB A1C  Result Value Ref Range   Hemoglobin A1C 5.6 4.0 - 5.6 %      Assessment & Plan:   Problem List Items Addressed This Visit    Chronic back pain   Relevant Medications   baclofen (LIORESAL) 10 MG tablet   Esophageal dysphagia    Clinically with improved dysphagia, partially after EGD and dilatation Followed by [facility] Dr Allen Norris, EGD 10/2017, had stricture distally on barium imaging Improved on PPI pantoprazole 40mg  daily - will refill Follow-up with GI when ready, if interested in repeat EGD and procedure      Relevant Medications   pantoprazole (PROTONIX) 40 MG tablet   Pre-diabetes - Primary    Stable well-controlled Pre-DM with A1c 5.6 to 5.6 Concern with HTN, HLD, CAD  Plan:  1. Not on any therapy currently - remain off 2. Encourage improved lifestyle - low carb, low sugar diet, reduce portion size, continue improving regular exercise 3. Follow-up 6 months labs      Relevant Orders   POCT HgB A1C (Completed)   Primary osteoarthritis involving multiple joints    Chronic problem, multiple joints including back pain, seems MSK muscular related currently  Increase Tyleol to 500mg  x 2 per dose TID for inc dosing Rx re order Baclofen trial again on muscle relaxant, had problem swallowing before Follow-up as planned - may consider PT or other intervention      Relevant Medications   baclofen (LIORESAL) 10 MG tablet    Other Visit Diagnoses    Gastroesophageal reflux  disease, esophagitis presence not specified       Relevant Medications   pantoprazole (PROTONIX) 40 MG tablet      Meds ordered this encounter  Medications  . pantoprazole (PROTONIX) 40 MG tablet    Sig: Take 1 tablet (40 mg total) by mouth daily before breakfast.    Dispense:  90 tablet    Refill:  1  . baclofen (LIORESAL) 10 MG tablet    Sig: Take 0.5-1 tablets (5-10 mg total) by mouth 3 (three) times daily as needed for muscle spasms.    Dispense:  30 each    Refill:  2     Follow up plan: Return in about 6 months (around 06/20/2018) for Yearly Medicare Checkup.  Future labs ordered for 06/2018  [name], [facility] 12/19/2017, 2:03 PM

## 2017-12-19 NOTE — Patient Instructions (Addendum)
[name] , Thank you for taking time to come for your Medicare Wellness Visit. I appreciate your ongoing commitment to your health goals. Please review the following plan we discussed and let me know if I can assist you in the future.   Screening recommendations/referrals: Colonoscopy: No longer required.  Recommended yearly ophthalmology/optometry visit for glaucoma screening and checkup Recommended yearly dental visit for hygiene and checkup  Vaccinations: Influenza vaccine: Up to date Pneumococcal vaccine: Completed series Tdap vaccine: Pt declines today.  Shingles vaccine: Pt declines today.     Advanced directives: Please bring a copy of your POA (Power of Attorney) and/or Living Will to your next appointment.   Conditions/risks identified: Continue increasing water intake to 6-8 glasses a day.   Next appointment: 1:40 PM today with Dr Parks Ranger  Preventive Care 62 Years and Older, Male Preventive care refers to lifestyle choices and visits with your health care provider that can promote health and wellness. What does preventive care include?  A yearly physical exam. This is also called an annual well check.  Dental exams once or twice a year.  Routine eye exams. Ask your health care provider how often you should have your eyes checked.  Personal lifestyle choices, including:  Daily care of your teeth and gums.  Regular physical activity.  Eating a healthy diet.  Avoiding tobacco and drug use.  Limiting alcohol use.  Practicing safe sex.  Taking low doses of aspirin every day.  Taking vitamin and mineral supplements as recommended by your health care provider. What happens during an annual well check? The services and screenings done by your health care provider during your annual well check will depend on your age, overall health, lifestyle risk factors, and family history of disease. Counseling  Your health care provider may ask you questions about  your:  Alcohol use.  Tobacco use.  Drug use.  Emotional well-being.  Home and relationship well-being.  Sexual activity.  Eating habits.  History of falls.  Memory and ability to understand (cognition).  Work and work Statistician. Screening  You may have the following tests or measurements:  Height, weight, and BMI.  Blood pressure.  Lipid and cholesterol levels. These may be checked every 5 years, or more frequently if you are over 29 years old.  Skin check.  Lung cancer screening. You may have this screening every year starting at age 41 if you have a 30-pack-year history of smoking and currently smoke or have quit within the past 15 years.  Fecal occult blood test (FOBT) of the stool. You may have this test every year starting at age 54.  Flexible sigmoidoscopy or colonoscopy. You may have a sigmoidoscopy every 5 years or a colonoscopy every 10 years starting at age 18.  Prostate cancer screening. Recommendations will vary depending on your family history and other risks.  Hepatitis C blood test.  Hepatitis B blood test.  Sexually transmitted disease (STD) testing.  Diabetes screening. This is done by checking your blood sugar (glucose) after you have not eaten for a while (fasting). You may have this done every 1-3 years.  Abdominal aortic aneurysm (AAA) screening. You may need this if you are a current or former smoker.  Osteoporosis. You may be screened starting at age 41 if you are at high risk. Talk with your health care provider about your test results, treatment options, and if necessary, the need for more tests. Vaccines  Your health care provider may recommend certain vaccines, such as:  Influenza vaccine. This is recommended every year.  Tetanus, diphtheria, and acellular pertussis (Tdap, Td) vaccine. You may need a Td booster every 10 years.  Zoster vaccine. You may need this after age 24.  Pneumococcal 13-valent conjugate (PCV13) vaccine.  One dose is recommended after age 9.  Pneumococcal polysaccharide (PPSV23) vaccine. One dose is recommended after age 72. Talk to your health care provider about which screenings and vaccines you need and how often you need them. This information is not intended to replace advice given to you by your health care provider. Make sure you discuss any questions you have with your health care provider. Document Released: 01/23/2015 Document Revised: 09/16/2015 Document Reviewed: 10/28/2014 Elsevier Interactive Patient Education  2017 [place] Prevention in the Home Falls can cause injuries. They can happen to people of all ages. There are many things you can do to make your home safe and to help prevent falls. What can I do on the outside of my home?  Regularly fix the edges of walkways and driveways and fix any cracks.  Remove anything that might make you trip as you walk through a door, such as a raised step or threshold.  Trim any bushes or trees on the path to your home.  Use bright outdoor lighting.  Clear any walking paths of anything that might make someone trip, such as rocks or tools.  Regularly check to see if handrails are loose or broken. Make sure that both sides of any steps have handrails.  Any raised decks and porches should have guardrails on the edges.  Have any leaves, snow, or ice cleared regularly.  Use sand or salt on walking paths during winter.  Clean up any spills in your garage right away. This includes oil or grease spills. What can I do in the bathroom?  Use night lights.  Install grab bars by the toilet and in the tub and shower. Do not use towel bars as grab bars.  Use non-skid mats or decals in the tub or shower.  If you need to sit down in the shower, use a plastic, non-slip stool.  Keep the floor dry. Clean up any water that spills on the floor as soon as it happens.  Remove soap buildup in the tub or shower regularly.  Attach bath  mats securely with double-sided non-slip rug tape.  Do not have throw rugs and other things on the floor that can make you trip. What can I do in the bedroom?  Use night lights.  Make sure that you have a light by your bed that is easy to reach.  Do not use any sheets or blankets that are too big for your bed. They should not hang down onto the floor.  Have a firm chair that has side arms. You can use this for support while you get dressed.  Do not have throw rugs and other things on the floor that can make you trip. What can I do in the kitchen?  Clean up any spills right away.  Avoid walking on wet floors.  Keep items that you use a lot in easy-to-reach places.  If you need to reach something above you, use a strong step stool that has a grab bar.  Keep electrical cords out of the way.  Do not use floor polish or wax that makes floors slippery. If you must use wax, use non-skid floor wax.  Do not have throw rugs and other things on the floor that  can make you trip. What can I do with my stairs?  Do not leave any items on the stairs.  Make sure that there are handrails on both sides of the stairs and use them. Fix handrails that are broken or loose. Make sure that handrails are as long as the stairways.  Check any carpeting to make sure that it is firmly attached to the stairs. Fix any carpet that is loose or worn.  Avoid having throw rugs at the top or bottom of the stairs. If you do have throw rugs, attach them to the floor with carpet tape.  Make sure that you have a light switch at the top of the stairs and the bottom of the stairs. If you do not have them, ask someone to add them for you. What else can I do to help prevent falls?  Wear shoes that:  Do not have high heels.  Have rubber bottoms.  Are comfortable and fit you well.  Are closed at the toe. Do not wear sandals.  If you use a stepladder:  Make sure that it is fully opened. Do not climb a closed  stepladder.  Make sure that both sides of the stepladder are locked into place.  Ask someone to hold it for you, if possible.  Clearly mark and make sure that you can see:  Any grab bars or handrails.  First and last steps.  Where the edge of each step is.  Use tools that help you move around (mobility aids) if they are needed. These include:  Canes.  Walkers.  Scooters.  Crutches.  Turn on the lights when you go into a dark area. Replace any light bulbs as soon as they burn out.  Set up your furniture so you have a clear path. Avoid moving your furniture around.  If any of your floors are uneven, fix them.  If there are any pets around you, be aware of where they are.  Review your medicines with your doctor. Some medicines can make you feel dizzy. This can increase your chance of falling. Ask your doctor what other things that you can do to help prevent falls. This information is not intended to replace advice given to you by your health care provider. Make sure you discuss any questions you have with your health care provider. Document Released: 10/23/2008 Document Revised: 06/04/2015 Document Reviewed: 01/31/2014 Elsevier Interactive Patient Education  2017 [employer].

## 2017-12-19 NOTE — Assessment & Plan Note (Signed)
Chronic problem, multiple joints including back pain, seems MSK muscular related currently  Increase Tyleol to 500mg  x 2 per dose TID for inc dosing Rx re order Baclofen trial again on muscle relaxant, had problem swallowing before Follow-up as planned - may consider PT or other intervention

## 2017-12-19 NOTE — Progress Notes (Signed)
Subjective:   [name] is a 84 y.o. male who presents for Medicare Annual/Subsequent preventive examination.  Review of Systems:  N/A  Cardiac Risk Factors include: advanced age (>55men, >92 women);dyslipidemia;hypertension;male gender     Objective:    Vitals: BP 134/72 (BP Location: Right Arm)   Pulse 68   Temp 98.6 F (37 C) (Oral)   Ht 6\' 1"  (1.854 m)   Wt 160 lb 6.4 oz (72.8 kg)   BMI 21.16 kg/m   Body mass index is 21.16 kg/m.  Advanced Directives 12/19/2017 10/24/2017 12/06/2016 11/24/2015 05/28/2015 11/28/2014 05/17/2012  Does Patient Have a Medical Advance Directive? Yes No No No No No -  Type of Paramedic of [place];Living will - - - - - -  Copy of [place] in Chart? No - copy requested - - - - - -  Would patient like information on creating a medical advance directive? - - No - Patient declined Yes - Educational materials given No - patient declined information Yes Higher education careers adviser given -  Pre-existing out of facility DNR order (yellow form or pink MOST form) - - - - - - No    Tobacco Social History   Tobacco Use  Smoking Status Former Smoker  . Types: Cigars  . Last attempt to quit: 10/04/1988  . Years since quitting: 29.2  Smokeless Tobacco Former Systems developer  . Types: Chew  . Quit date: 09/04/1991     Counseling given: Not Answered   Clinical Intake:  Pre-visit preparation completed: Yes  Pain : 0-10 Pain Score: 4  Pain Type: Chronic pain Pain Location: Back Pain Orientation: Right, Mid Pain Descriptors / Indicators: Aching, Sore Pain Frequency: Constant     Nutritional Status: BMI of 19-24  Normal Nutritional Risks: None Diabetes: No  How often do you need to have someone help you when you read instructions, pamphlets, or other written materials from your doctor or pharmacy?: 1 - Never  Interpreter Needed?: No  Information entered by :: [facility], LPN  Past Medical History:    Diagnosis Date  . Arthritis   . Coronary artery disease    a. 1993 s/p PCI/BMS LAD;  b. 2003 PCI RCA;  c. 09/2006 ISR LAD->PCI, PCI RCA;  d. 02/2007 MV: no ischemia;  d. 01/2012 MV: EF 74%, no ischemia.  [name] History of prostate cancer   . Hyperlipidemia   . Hyperthyroidism   . Low testosterone   . PAF (paroxysmal atrial fibrillation) ([place])    a. previously on amio ->stopped in 2013;  b. recurrent PAF 01/2012->Pradaxa and amio initiated, subsequently came off of pradaxa 2/2 cost;  c. 02/2012 Echo: EF 55-60%, Gr 1 DD.  [name] Prostate cancer ([place])   . S/P CABG x 4    Past Surgical History:  Procedure Laterality Date  . APPENDECTOMY     Dr. Bary Castilla  . CARDIAC CATHETERIZATION  2008  . CLIPPING OF ATRIAL APPENDAGE Left 05/18/2012   Procedure: CLIPPING OF ATRIAL APPENDAGE;  Surgeon: [name], MD;  Location: [place];  Service: Open Heart Surgery;  Laterality: Left;  . COLONOSCOPY  2013  . COLONOSCOPY WITH PROPOFOL N/A 07/12/2016   Procedure: COLONOSCOPY WITH PROPOFOL;  Surgeon: [name], MD;  [facility]COPY;  Service: Endoscopy;  Laterality: N/A;  . CORONARY ARTERY BYPASS GRAFT N/A 05/18/2012   Procedure: CORONARY ARTERY BYPASS GRAFTING (CABG);  Surgeon: [name], MD;  Location: [place];  Service: Open Heart Surgery;  Laterality:  N/A;  Times 4 using left internal mammary artery and endoscopically harvested right saphenous vein  . ESOPHAGOGASTRODUODENOSCOPY (EGD) WITH PROPOFOL N/A 10/24/2017   Procedure: ESOPHAGOGASTRODUODENOSCOPY (EGD) WITH PROPOFOL;  Surgeon: [name], MD;  [facility] END[facility]COPY;  Service: Endoscopy;  Laterality: N/A;  . HERNIA REPAIR     s/p mesh bilaterally, Dr. Bary Castilla  . INTRAOPERATIVE TRANSESOPHAGEAL ECHOCARDIOGRAM N/A 05/18/2012   Procedure: INTRAOPERATIVE TRANSESOPHAGEAL ECHOCARDIOGRAM;  Surgeon: [name], MD;  Location: [place];  Service: Open Heart Surgery;  Laterality: N/A;  . IR ESOPHAGUS DILITATION RETRO FLUORO    . LEFT HEART CATHETERIZATION  WITH CORONARY ANGIOGRAM Bilateral 05/16/2012   Procedure: LEFT HEART CATHETERIZATION WITH CORONARY ANGIOGRAM;  Surgeon: Burnell Blanks, MD;  Location: [facility] CATH LAB;  Service: Cardiovascular;  Laterality: Bilateral;  . MAZE N/A 05/18/2012   Procedure: MAZE;  Surgeon: [name], MD;  Location: [place];  Service: Open Heart Surgery;  Laterality: N/A;  . MOHS SURGERY    . PROSTATECTOMY    . TOTAL HIP ARTHROPLASTY Right    Dr. [name]   Family History  Problem Relation Age of Onset  . Coronary artery disease Mother   . Heart disease Mother   . Coronary artery disease Sister   . Diabetes Sister   . Heart disease Sister 6       deceased  . Diabetes Father   . Heart disease Brother    Social History   Socioeconomic History  . Marital status: Married    Spouse name: [name] Hucks  . Number of children: 1  . Years of education: JD  . Highest education level: Bachelor's degree (e.g., BA, AB, BS)  Occupational History  . 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 Occupation: Former Press photographer  . Financial resource strain: Not hard at all  . Food insecurity:    Worry: Never true    Inability: Never true  . Transportation needs:    Medical: No    Non-medical: No  Tobacco Use  . Smoking status: Former Smoker    Types: Cigars    Last attempt to quit: 10/04/1988    Years since quitting: 29.2  . Smokeless tobacco: Former Systems developer    Types: Ages date: 09/04/1991  Substance and Sexual Activity  . Alcohol use: Yes    Alcohol/week: 7.0 standard drinks    Types: 7 Glasses of wine per week  . Drug use: No  . Sexual activity: Not Currently  Lifestyle  . Physical activity:    Days per week: 3 days    Minutes per session: 20 min  . Stress: Not at all  Relationships  . Social connections:    Talks on phone: Once a week    Gets together: Once a week    Attends religious service: More than 4 times per year     Active member of club or organization: No    Attends meetings of clubs or organizations: Never    Relationship status: Married  Other Topics Concern  . Not on file  Social History Narrative   Lives in [place] with wife. He was born in [place] and went to [employer] in [place]. Daughter in [place], 2 step-daughters. Retired from [employer].    Outpatient Encounter Medications as of 12/19/2017  Medication Sig  . Acetaminophen (TYLENOL ARTHRITIS PAIN PO) Take by mouth as needed.  [name] aspirin EC 81 MG tablet  Take 81 mg by mouth every evening.  . bisacodyl (DULCOLAX) 10 MG suppository Place 10 mg rectally daily as needed for moderate constipation.  . Cholecalciferol (VITAMIN D3) 1.25 MG (50000 UT) CAPS Take by mouth daily.  [name] ezetimibe (ZETIA) 10 MG tablet TAKE ONE TABLET BY MOUTH DAILY  . ipratropium (ATROVENT) 0.06 % nasal spray Place 1 spray into both nostrils as needed.   . pantoprazole (PROTONIX) 40 MG tablet Take 1 tablet (40 mg total) by mouth daily.  . polyethylene glycol (MIRALAX / GLYCOLAX) packet Take 17 g by mouth daily as needed.  . testosterone cypionate (DEPOTESTOSTERONE CYPIONATE) 200 MG/ML injection Inject 0.3 mLs (60 mg total) into the muscle every 14 (fourteen) days.  [name] timolol (TIMOPTIC) 0.5 % ophthalmic solution Place 1 drop into both eyes daily.   [name] latanoprost (XALATAN) 0.005 % ophthalmic solution   . simvastatin (ZOCOR) 40 MG tablet TAKE 1 TABLET (40 MG TOTAL) BY MOUTH AT BEDTIME. (Patient not taking: Reported on 12/19/2017)   No facility-administered encounter medications on file as of 12/19/2017.     Activities of Daily Living In your present state of health, do you have any difficulty performing the following activities: 12/19/2017  Hearing? Y  Comment Due to tinnitus.  Vision? 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   Difficulty concentrating or making decisions? Y  Walking or climbing stairs? N  Dressing or bathing? N  Doing errands, shopping? N  Preparing  Food and eating ? N  Using the Toilet? N  In the past six months, have you accidently leaked urine? Y  Comment Occasionally, wears protection daily.  Do you have problems with loss of bowel control? N  Managing your Medications? N  Managing your Finances? N  Housekeeping or managing your Housekeeping? N  Some recent data might be hidden    Patient Care Team: [name], DO as PCP - General (Family Medicine) Minna Merritts, MD as Consulting Physician (Cardiology) Estill Cotta, MD (Ophthalmology)   Assessment:   This is a routine wellness examination for Gwyndolyn Saxon.  Exercise Activities and Dietary recommendations Current Exercise Habits: Home exercise routine, Type of exercise: strength training/weights;stretching, Time (Minutes): 20, Frequency (Times/Week): 3, Weekly Exercise (Minutes/Week): 60, Intensity: Mild, Exercise limited by: orthopedic condition(s)  Goals    . DIET - INCREASE WATER INTAKE     Recommend drinking at least 6-8 glasses of water a day    . Increase water intake     STAY HYDRATED AND DRINK PLENTY OF WATER.       Fall Risk Fall Risk  12/19/2017 06/07/2017 12/06/2016 12/06/2016 11/24/2015  Falls in the past year? 0 No No No No  Number falls in past yr: - - - - -  Injury with Fall? - - - - -  Follow up - - - - -   [place]:  Any stairs in or around the home WITH handrails? No  Home free of loose throw rugs in walkways, pet beds, electrical cords, etc? Yes  Adequate lighting in your home to reduce risk of falls? Yes   ASSISTIVE DEVICES UTILIZED TO PREVENT FALLS:  Life alert? No  Use of a cane, walker or w/c? No  Grab bars in the bathroom? No  Shower chair or bench in shower? No  Elevated toilet seat or a handicapped toilet? No    TIMED UP AND GO:  Was the test performed? No .     Depression Screen [facility] 2/9  Scores 12/19/2017 06/07/2017 12/06/2016 12/06/2016  PHQ - 2 Score 0 1 0 0     Cognitive Function MMSE - Mini Mental State Exam 06/07/2017 11/24/2015 11/28/2014  Orientation to time 5 5 5   Orientation to Place 5 5 5   Registration 3 3 3   Attention/ Calculation 5 5 5   Recall 3 3 3   Language- name 2 objects 2 2 2   Language- repeat 1 1 1   Language- follow 3 step command 3 3 3   Language- read & follow direction 1 1 1   Write a sentence 1 1 1   Copy design 1 1 1   Total score 30 30 30      6CIT Screen 12/06/2016  What Year? 0 points  What month? 0 points  What time? 0 points  Count back from 20 0 points  Months in reverse 0 points  Repeat phrase 0 points  Total Score 0    Immunization History  Administered Date(s) Administered  . Influenza Split 10/05/2010, 10/27/2011, 10/10/2013  . Influenza, High Dose Seasonal PF 09/23/2015, 09/19/2016, 09/17/2017  . Influenza,inj,Quad PF,6+ Mos 09/18/2012, 11/04/2014  . Pneumococcal Conjugate-13 02/19/2013  . Pneumococcal Polysaccharide-23 11/15/2007    Qualifies for Shingles Vaccine? Yes . Due for Shingrix. Education has been provided regarding the importance of this vaccine. Pt has been advised to call insurance company to determine out of pocket expense. Advised may also receive vaccine at local pharmacy or Health Dept. Verbalized acceptance and understanding.  Tdap: Although this vaccine is not a covered service during a Wellness Exam, does the patient still wish to receive this vaccine today?  No .  Education has been provided regarding the importance of this vaccine. Advised may receive this vaccine at local pharmacy or Health Dept. Aware to provide a copy of the vaccination record if obtained from local pharmacy or Health Dept. Verbalized acceptance and understanding.  Flu Vaccine: Up to date  Pneumococcal Vaccine: Up to date   Screening Tests Health Maintenance  Topic Date Due  . TETANUS/TDAP  12/01/1951  . INFLUENZA VACCINE  Completed  . PNA vac Low Risk Adult  Completed   Cancer  Screenings:  Colorectal Screening: No longer required.   Lung Cancer Screening: (Low Dose CT Chest recommended if Age 56-80 years, 30 pack-year currently smoking OR have quit w/in 15years.) does not qualify.    Additional Screening:  Vision Screening: Recommended annual ophthalmology exams for early detection of glaucoma and other disorders of the eye.  Dental Screening: Recommended annual dental exams for proper oral hygiene  Community Resource Referral:  CRR required this visit?  No        Plan:  I have personally reviewed and addressed the Medicare Annual Wellness questionnaire and have noted the following in the patient's chart:  A. Medical and social history B. Use of alcohol, tobacco or illicit drugs  C. Current medications and supplements D. Functional ability and status E.  Nutritional status F.  Physical activity G. Advance directives H. List of other physicians I.  Hospitalizations, surgeries, and ER visits in previous 12 months J.  [place] such as hearing and vision if needed, cognitive and depression L. Referrals and appointments - none  In addition, I have reviewed and discussed with patient certain preventive protocols, quality metrics, and best practice recommendations. A written personalized care plan for preventive services as well as general preventive health recommendations were provided to patient.  See attached scanned questionnaire for additional information.   Signed,  Fabio Neighbors, LPN Nurse Health Advisor  Nurse Recommendations: Pt declined the tetanus vaccine today.

## 2017-12-19 NOTE — Patient Instructions (Addendum)
Thank you for coming to the office today.  For Back Pain  Recommend to start taking Tylenol Extra Strength 500mg  tabs - take 1 to 2 tabs per dose (max 1000mg ) every 6-8 hours for pain (take regularly, don't skip a dose for next 7 days), max 24 hour daily dose is 6 tablets or 3000mg . In the future you can repeat the same everyday Tylenol course for 1-2 weeks at a time.   Start taking Baclofen (Lioresal) 10mg  (muscle relaxant) - start with half (cut) to one whole pill at night as needed for next 1-3 nights (may make you drowsy, caution with driving) see how it affects you, then if tolerated increase to one pill 2 to 3 times a day or (every 8 hours as needed)  ---------------------------------------------  For throat / esophagus - refilled Pantoprazole 40mg  daily for now - we can continue this longer term, if interested in further evaluation or testing from Dr Allen Norris [facility] let them know - may need another procedure or alternative test  DUE for FASTING BLOOD WORK (no food or drink after midnight before the lab appointment, only water or coffee without cream/sugar on the morning of)  SCHEDULE "Lab Only" visit in the morning at the clinic for lab draw in 6 MONTHS   - Make sure Lab Only appointment is at about 1 week before your next appointment, so that results will be available  For Lab Results, once available within 2-3 days of blood draw, you can can log in to MyChart online to view your results and a brief explanation. Also, we can discuss results at next follow-up visit.   Please schedule a Follow-up Appointment to: Return in about 6 months (around 06/20/2018) for Yearly Medicare Checkup.  If you have any other questions or concerns, please feel free to call the office or send a message through [place]. You may also schedule an earlier appointment if necessary.  Additionally, you may be receiving a survey about your experience at our office within a few days to 1 week by e-mail or mail. We value  your feedback.  [name], DO [place]

## 2017-12-19 NOTE — Assessment & Plan Note (Signed)
Clinically with improved dysphagia, partially after EGD and dilatation Followed by [facility] Dr Allen Norris, EGD 10/2017, had stricture distally on barium imaging Improved on PPI pantoprazole 40mg  daily - will refill Follow-up with GI when ready, if interested in repeat EGD and procedure

## 2017-12-19 NOTE — Assessment & Plan Note (Signed)
Stable well-controlled Pre-DM with A1c 5.6 to 5.6 Concern with HTN, HLD, CAD  Plan:  1. Not on any therapy currently - remain off 2. Encourage improved lifestyle - low carb, low sugar diet, reduce portion size, continue improving regular exercise 3. Follow-up 6 months labs

## 2018-01-19 ENCOUNTER — Other Ambulatory Visit: Payer: Self-pay | Admitting: Family Medicine

## 2018-01-19 DIAGNOSIS — I10 Essential (primary) hypertension: Secondary | ICD-10-CM

## 2018-01-19 DIAGNOSIS — M150 Primary generalized (osteo)arthritis: Secondary | ICD-10-CM

## 2018-01-19 DIAGNOSIS — M159 Polyosteoarthritis, unspecified: Secondary | ICD-10-CM

## 2018-01-19 DIAGNOSIS — E559 Vitamin D deficiency, unspecified: Secondary | ICD-10-CM

## 2018-01-19 DIAGNOSIS — R7303 Prediabetes: Secondary | ICD-10-CM

## 2018-01-19 DIAGNOSIS — R351 Nocturia: Secondary | ICD-10-CM | POA: Diagnosis not present

## 2018-01-19 DIAGNOSIS — I714 Abdominal aortic aneurysm, without rupture, unspecified: Secondary | ICD-10-CM

## 2018-01-19 DIAGNOSIS — E782 Mixed hyperlipidemia: Secondary | ICD-10-CM | POA: Diagnosis not present

## 2018-01-19 DIAGNOSIS — Z0000 Encounter for general adult medical examination without abnormal findings: Secondary | ICD-10-CM

## 2018-01-19 DIAGNOSIS — Z8679 Personal history of other diseases of the circulatory system: Secondary | ICD-10-CM

## 2018-01-19 DIAGNOSIS — I2510 Atherosclerotic heart disease of native coronary artery without angina pectoris: Secondary | ICD-10-CM

## 2018-01-20 LAB — CBC WITH DIFFERENTIAL/PLATELET
Absolute Monocytes: 479 cells/uL (ref 200–950)
Basophils Absolute: 39 cells/uL (ref 0–200)
Basophils Relative: 0.7 %
EOS ABS: 121 {cells}/uL (ref 15–500)
Eosinophils Relative: 2.2 %
HCT: 41.2 % (ref 38.5–50.0)
Hemoglobin: 13.7 g/dL (ref 13.2–17.1)
Lymphs Abs: 1051 cells/uL (ref 850–3900)
MCH: 27.9 pg (ref 27.0–33.0)
MCHC: 33.3 g/dL (ref 32.0–36.0)
MCV: 83.9 fL (ref 80.0–100.0)
MPV: 11.8 fL (ref 7.5–12.5)
Monocytes Relative: 8.7 %
Neutro Abs: 3812 cells/uL (ref 1500–7800)
Neutrophils Relative %: 69.3 %
Platelets: 233 10*3/uL (ref 140–400)
RBC: 4.91 10*6/uL (ref 4.20–5.80)
RDW: 12.5 % (ref 11.0–15.0)
Total Lymphocyte: 19.1 %
WBC: 5.5 10*3/uL (ref 3.8–10.8)

## 2018-01-20 LAB — LIPID PANEL
CHOL/HDL RATIO: 3.9 (calc) (ref <5.0)
CHOLESTEROL: 174 mg/dL (ref <200)
HDL: 45 mg/dL (ref >40)
LDL CHOLESTEROL (CALC): 110 mg/dL — AB
Non-HDL Cholesterol (Calc): 129 mg/dL (calc) (ref <130)
TRIGLYCERIDES: 93 mg/dL (ref <150)

## 2018-01-20 LAB — COMPLETE METABOLIC PANEL WITH GFR
AG RATIO: 1.7 (calc) (ref 1.0–2.5)
ALKALINE PHOSPHATASE (APISO): 81 U/L (ref 40–115)
ALT: 10 U/L (ref 9–46)
AST: 16 U/L (ref 10–35)
Albumin: 4.5 g/dL (ref 3.6–5.1)
BILIRUBIN TOTAL: 1 mg/dL (ref 0.2–1.2)
BUN: 17 mg/dL (ref 7–25)
CHLORIDE: 101 mmol/L (ref 98–110)
CO2: 30 mmol/L (ref 20–32)
Calcium: 10 mg/dL (ref 8.6–10.3)
Creat: 1.08 mg/dL (ref 0.70–1.11)
GFR, EST NON AFRICAN AMERICAN: 63 mL/min/{1.73_m2} (ref >=60)
GFR, Est African American: 73 mL/min/{1.73_m2} (ref >=60)
Globulin: 2.7 g/dL (calc) (ref 1.9–3.7)
Glucose, Bld: 114 mg/dL — ABNORMAL HIGH (ref 65–99)
POTASSIUM: 4.5 mmol/L (ref 3.5–5.3)
SODIUM: 139 mmol/L (ref 135–146)
Total Protein: 7.2 g/dL (ref 6.1–8.1)

## 2018-01-20 LAB — VITAMIN D 25 HYDROXY (VIT D DEFICIENCY, FRACTURES): Vit D, 25-Hydroxy: 42 ng/mL (ref 30–100)

## 2018-01-20 LAB — HEMOGLOBIN A1C
Hgb A1c MFr Bld: 5.6 % of total Hgb (ref <5.7)
Mean Plasma Glucose: 114 (calc)
eAG (mmol/L): 6.3 (calc)

## 2018-01-20 LAB — PSA: PSA: <0.1 ng/mL (ref <=4.0)

## 2018-01-20 LAB — TSH: TSH: 2.15 mIU/L (ref 0.40–4.50)

## 2018-01-25 DIAGNOSIS — H401121 Primary open-angle glaucoma, left eye, mild stage: Secondary | ICD-10-CM | POA: Diagnosis not present

## 2018-02-06 ENCOUNTER — Telehealth: Payer: Self-pay | Admitting: Urology

## 2018-02-06 NOTE — Telephone Encounter (Signed)
Pt stopped by office and wanted to know why his testosterone RX was not for 1 year like last time.  He states he has 2 vials left but will be out soon.  I told pt to contact pharmacy before he ran out and have them contact our office.

## 2018-02-07 NOTE — Telephone Encounter (Signed)
Please advise on refills of testosterone for this patient.

## 2018-02-08 MED ORDER — TESTOSTERONE CYPIONATE 200 MG/ML IM SOLN: 60.0000 mg | INTRAMUSCULAR | 0 refills | Status: DC

## 2018-02-08 NOTE — Telephone Encounter (Signed)
His last prescription was written for 10 mL which is what I have always written.  I do not have an answer as to why he was told he was out of refills.  Rx was sent

## 2018-05-03 DIAGNOSIS — L219 Seborrheic dermatitis, unspecified: Secondary | ICD-10-CM | POA: Diagnosis not present

## 2018-05-03 DIAGNOSIS — L570 Actinic keratosis: Secondary | ICD-10-CM | POA: Diagnosis not present

## 2018-05-03 DIAGNOSIS — L821 Other seborrheic keratosis: Secondary | ICD-10-CM | POA: Diagnosis not present

## 2018-05-03 DIAGNOSIS — L814 Other melanin hyperpigmentation: Secondary | ICD-10-CM | POA: Diagnosis not present

## 2018-07-03 ENCOUNTER — Other Ambulatory Visit: Payer: Self-pay | Admitting: Gastroenterology

## 2018-07-03 DIAGNOSIS — R1319 Other dysphagia: Secondary | ICD-10-CM

## 2018-07-03 DIAGNOSIS — K219 Gastro-esophageal reflux disease without esophagitis: Secondary | ICD-10-CM

## 2018-07-03 DIAGNOSIS — R1310 Dysphagia, unspecified: Secondary | ICD-10-CM

## 2018-07-07 DIAGNOSIS — R1310 Dysphagia, unspecified: Secondary | ICD-10-CM

## 2018-07-07 DIAGNOSIS — K219 Gastro-esophageal reflux disease without esophagitis: Secondary | ICD-10-CM

## 2018-07-07 DIAGNOSIS — R1319 Other dysphagia: Secondary | ICD-10-CM

## 2018-07-09 ENCOUNTER — Other Ambulatory Visit: Payer: Self-pay

## 2018-07-09 MED ORDER — PANTOPRAZOLE SODIUM 40 MG PO TBEC: 40.0000 mg | DELAYED_RELEASE_TABLET | Freq: Every day | ORAL | 1 refills | Status: DC

## 2018-07-17 DIAGNOSIS — S7002XA Contusion of left hip, initial encounter: Secondary | ICD-10-CM | POA: Diagnosis not present

## 2018-07-17 DIAGNOSIS — S2232XA Fracture of one rib, left side, initial encounter for closed fracture: Secondary | ICD-10-CM | POA: Diagnosis not present

## 2018-07-24 DIAGNOSIS — H401121 Primary open-angle glaucoma, left eye, mild stage: Secondary | ICD-10-CM | POA: Diagnosis not present

## 2018-07-26 DIAGNOSIS — S7002XA Contusion of left hip, initial encounter: Secondary | ICD-10-CM | POA: Diagnosis not present

## 2018-07-30 DIAGNOSIS — M25552 Pain in left hip: Secondary | ICD-10-CM | POA: Diagnosis not present

## 2018-07-30 DIAGNOSIS — M25652 Stiffness of left hip, not elsewhere classified: Secondary | ICD-10-CM | POA: Diagnosis not present

## 2018-08-03 DIAGNOSIS — M25552 Pain in left hip: Secondary | ICD-10-CM | POA: Diagnosis not present

## 2018-08-03 DIAGNOSIS — M25652 Stiffness of left hip, not elsewhere classified: Secondary | ICD-10-CM | POA: Diagnosis not present

## 2018-08-06 DIAGNOSIS — M25652 Stiffness of left hip, not elsewhere classified: Secondary | ICD-10-CM | POA: Diagnosis not present

## 2018-08-06 DIAGNOSIS — M25552 Pain in left hip: Secondary | ICD-10-CM | POA: Diagnosis not present

## 2018-08-10 DIAGNOSIS — M25652 Stiffness of left hip, not elsewhere classified: Secondary | ICD-10-CM | POA: Diagnosis not present

## 2018-08-10 DIAGNOSIS — M25552 Pain in left hip: Secondary | ICD-10-CM | POA: Diagnosis not present

## 2018-08-13 DIAGNOSIS — M25652 Stiffness of left hip, not elsewhere classified: Secondary | ICD-10-CM | POA: Diagnosis not present

## 2018-08-13 DIAGNOSIS — M25552 Pain in left hip: Secondary | ICD-10-CM | POA: Diagnosis not present

## 2018-08-17 DIAGNOSIS — M25652 Stiffness of left hip, not elsewhere classified: Secondary | ICD-10-CM | POA: Diagnosis not present

## 2018-08-17 DIAGNOSIS — M25552 Pain in left hip: Secondary | ICD-10-CM | POA: Diagnosis not present

## 2018-08-20 DIAGNOSIS — M25652 Stiffness of left hip, not elsewhere classified: Secondary | ICD-10-CM | POA: Diagnosis not present

## 2018-08-20 DIAGNOSIS — M25552 Pain in left hip: Secondary | ICD-10-CM | POA: Diagnosis not present

## 2018-08-24 DIAGNOSIS — M25552 Pain in left hip: Secondary | ICD-10-CM | POA: Diagnosis not present

## 2018-08-24 DIAGNOSIS — M25652 Stiffness of left hip, not elsewhere classified: Secondary | ICD-10-CM | POA: Diagnosis not present

## 2018-09-20 ENCOUNTER — Telehealth: Payer: Self-pay | Admitting: Urology

## 2018-09-20 MED ORDER — TESTOSTERONE CYPIONATE 200 MG/ML IM SOLN: 60.0000 mg | INTRAMUSCULAR | 0 refills | Status: DC

## 2018-09-20 NOTE — Telephone Encounter (Signed)
Pt. Came to office to day and wants someone to call him ASAP about his testosterone refill. The pharmacy told him he has no refills.

## 2018-09-20 NOTE — Telephone Encounter (Signed)
Rx was refilled

## 2018-09-24 ENCOUNTER — Other Ambulatory Visit: Payer: Self-pay | Admitting: Urology

## 2018-09-24 DIAGNOSIS — E291 Testicular hypofunction: Secondary | ICD-10-CM

## 2018-09-24 NOTE — Telephone Encounter (Signed)
Pt needs refill for Testosterone, and he has made a follow up appt with Dr Bernardo Heater for 10/17/2018, he states that he only has 1 weeks worth of medicine left.

## 2018-09-27 NOTE — Telephone Encounter (Signed)
Called pt informed him RX was sent, he gave verbal understanding and states that he has picked up RX.

## 2018-10-17 ENCOUNTER — Other Ambulatory Visit: Payer: Self-pay

## 2018-10-17 ENCOUNTER — Ambulatory Visit: Payer: PPO | Admitting: Urology

## 2018-10-17 ENCOUNTER — Encounter: Payer: Self-pay | Admitting: Urology

## 2018-10-17 VITALS — BP 154/89 | HR 79 | Ht 73.0 in | Wt 161.4 lb

## 2018-10-17 DIAGNOSIS — E291 Testicular hypofunction: Secondary | ICD-10-CM

## 2018-10-17 DIAGNOSIS — Z8546 Personal history of malignant neoplasm of prostate: Secondary | ICD-10-CM

## 2018-10-17 DIAGNOSIS — R300 Dysuria: Secondary | ICD-10-CM | POA: Diagnosis not present

## 2018-10-17 DIAGNOSIS — R399 Unspecified symptoms and signs involving the genitourinary system: Secondary | ICD-10-CM | POA: Diagnosis not present

## 2018-10-17 LAB — URINALYSIS, COMPLETE
Bilirubin, UA: NEGATIVE
Glucose, UA: NEGATIVE
Ketones, UA: NEGATIVE
Leukocytes,UA: NEGATIVE
Nitrite, UA: NEGATIVE
Protein,UA: NEGATIVE
RBC, UA: NEGATIVE
Specific Gravity, UA: >1.03 — ABNORMAL HIGH (ref 1.005–1.030)
Urobilinogen, Ur: 2 mg/dL — ABNORMAL HIGH (ref 0.2–1.0)
pH, UA: 6 (ref 5.0–7.5)

## 2018-10-17 LAB — MICROSCOPIC EXAMINATION
Bacteria, UA: NONE SEEN
Epithelial Cells (non renal): NONE SEEN /hpf (ref 0–10)
RBC, Urine: NONE SEEN /hpf (ref 0–2)
WBC, UA: NONE SEEN /hpf (ref 0–5)

## 2018-10-17 NOTE — Progress Notes (Signed)
10/17/2018 4:00 PM   [name] 1932/11/22 [id number]  Referring provider: [name], DO [address] [place],  [place] [address]  Chief Complaint  Patient presents with  . Hypogonadism    Urologic history:  1.  History prostate cancer low risk -Radical prostatectomy 2005; PSAs remain undetectable  2.  Symptomatic hypogonadism -TRT started 2013   HPI: 85 y.o. male presents for follow-up of hypogonadism. He was started on TRT 2013 for symptoms of excessive sweating and flushing.  The symptoms resolved on TRT.  He is injecting a low dose of 60 mg testosterone cypionate every 2 weeks.  His PSA has remained undetectable.  His last injection was 2 days ago.  He presents today with a family member who states he has recently had increased weakness, weight loss and slight decrease in cognitive abilities.  She was concerned he has a UTI although he does not have dysuria.  He does have nocturia x2 and stress incontinence.  Denies fever or chills.   PMH: Past Medical History:  Diagnosis Date  . Arthritis   . Coronary artery disease    a. 1993 s/p PCI/BMS LAD;  b. 2003 PCI RCA;  c. 09/2006 ISR LAD->PCI, PCI RCA;  d. 02/2007 MV: no ischemia;  d. 01/2012 MV: EF 74%, no ischemia.  [name] History of prostate cancer   . Hyperlipidemia   . Hyperthyroidism   . Low testosterone   . PAF (paroxysmal atrial fibrillation) ([place])    a. previously on amio ->stopped in 2013;  b. recurrent PAF 01/2012->Pradaxa and amio initiated, subsequently came off of pradaxa 2/2 cost;  c. 02/2012 Echo: EF 55-60%, Gr 1 DD.  [name] Prostate cancer ([place])   . S/P CABG x 4     Surgical History: Past Surgical History:  Procedure Laterality Date  . APPENDECTOMY     Dr. Bary Castilla  . CARDIAC CATHETERIZATION  2008  . CLIPPING OF ATRIAL APPENDAGE Left 05/18/2012   Procedure: CLIPPING OF ATRIAL APPENDAGE;  Surgeon: [name], MD;  Location: [facility];  Service: Open Heart Surgery;  Laterality: Left;  . COLONOSCOPY   2013  . COLONOSCOPY WITH PROPOFOL N/A 07/12/2016   Procedure: COLONOSCOPY WITH PROPOFOL;  Surgeon: [name], MD;  Location: [facility] ENDOSCOPY;  Service: Endoscopy;  Laterality: N/A;  . CORONARY ARTERY BYPASS GRAFT N/A 05/18/2012   Procedure: CORONARY ARTERY BYPASS GRAFTING (CABG);  Surgeon: [name], MD;  Location: [facility];  Service: Open Heart Surgery;  Laterality: N/A;  Times 4 using left internal mammary artery and endoscopically harvested right saphenous vein  . ESOPHAGOGASTRODUODENOSCOPY (EGD) WITH PROPOFOL N/A 10/24/2017   Procedure: ESOPHAGOGASTRODUODENOSCOPY (EGD) WITH PROPOFOL;  Surgeon: [name], MD;  Location: [facility] ENDOSCOPY;  Service: Endoscopy;  Laterality: N/A;  . HERNIA REPAIR     s/p mesh bilaterally, Dr. Bary Castilla  . INTRAOPERATIVE TRANSESOPHAGEAL ECHOCARDIOGRAM N/A 05/18/2012   Procedure: INTRAOPERATIVE TRANSESOPHAGEAL ECHOCARDIOGRAM;  Surgeon: [name], MD;  Location: [facility];  Service: Open Heart Surgery;  Laterality: N/A;  . IR ESOPHAGUS DILITATION RETRO FLUORO    . LEFT HEART CATHETERIZATION WITH CORONARY ANGIOGRAM Bilateral 05/16/2012   Procedure: LEFT HEART CATHETERIZATION WITH CORONARY ANGIOGRAM;  Surgeon: Burnell Blanks, MD;  Location[facility] CATH LAB;  Service: Cardiovascular;  Laterality: Bilateral;  . MAZE N/A 05/18/2012   Procedure: MAZE;  Surgeon: [name], MD;  Location: [facility];  Service: Open Heart Surgery;  Laterality: N/A;  . MOHS SURGERY    . PROSTATECTOMY    .  TOTAL HIP ARTHROPLASTY Right    Dr. [name]    Home Medications:  Allergies as of 10/17/2018      Reactions   Ambien [zolpidem Tartrate] Other (See Comments)   Super sensitive.    Pravastatin Sodium    REACTION: muscle aches...crestor   Rosuvastatin Calcium    REACTION: muscle aches...crestor   Statins    REACTION: muscle aches...crestor   Zolpidem Tartrate       Medication List       Accurate as of October 17, 2018  4:00 PM. If you have any questions, ask your nurse or doctor.         STOP taking these medications   simvastatin 40 MG tablet Commonly known as: ZOCOR Stopped by: Abbie Sons, MD     TAKE these medications   aspirin EC 81 MG tablet Take 81 mg by mouth every evening.   baclofen 10 MG tablet Commonly known as: LIORESAL Take 0.5-1 tablets (5-10 mg total) by mouth 3 (three) times daily as needed for muscle spasms.   bisacodyl 10 MG suppository Commonly known as: DULCOLAX Place 10 mg rectally daily as needed for moderate constipation.   ezetimibe 10 MG tablet Commonly known as: ZETIA TAKE ONE TABLET BY MOUTH DAILY   hydrocortisone 2.5 % cream   ipratropium 0.06 % nasal spray Commonly known as: ATROVENT Place 1 spray into both nostrils as needed.   latanoprost 0.005 % ophthalmic solution Commonly known as: XALATAN   pantoprazole 40 MG tablet Commonly known as: PROTONIX Take 1 tablet (40 mg total) by mouth daily before breakfast.   polyethylene glycol 17 g packet Commonly known as: MIRALAX / GLYCOLAX Take 17 g by mouth daily as needed.   testosterone cypionate 200 MG/ML injection Commonly known as: DEPOTESTOSTERONE CYPIONATE Inject 0.3 mLs (60 mg total) into the muscle every 14 (fourteen) days.   timolol 0.5 % ophthalmic solution Commonly known as: TIMOPTIC Place 1 drop into both eyes daily.   TYLENOL ARTHRITIS PAIN PO Take by mouth as needed.   Vitamin D3 1.25 MG (50000 UT) Caps Take by mouth daily.       Allergies:  Allergies  Allergen Reactions  . Ambien [Zolpidem Tartrate] Other (See Comments)    Super sensitive.   . Pravastatin Sodium     REACTION: muscle aches...crestor  . Rosuvastatin Calcium     REACTION: muscle aches...crestor  . Statins     REACTION: muscle aches...crestor  . Zolpidem Tartrate     Family History: Family History  Problem Relation Age of Onset  . Coronary artery disease Mother   . Heart disease Mother   . Coronary artery disease Sister   . Diabetes Sister   . Heart disease  Sister 75       deceased  . Diabetes Father   . Heart disease Brother     Social History:  reports that he quit smoking about 30 years ago. His smoking use included cigars. He quit smokeless tobacco use about 27 years ago.  His smokeless tobacco use included chew. He reports current alcohol use of about 7.0 standard drinks of alcohol per week. He reports that he does not use drugs.  ROS: UROLOGY Frequent Urination?: No Hard to postpone urination?: No Burning/pain with urination?: No Get up at night to urinate?: Yes Leakage of urine?: Yes Urine stream starts and stops?: No Trouble starting stream?: No Do you have to strain to urinate?: No Blood in urine?: No Urinary tract infection?: No Sexually transmitted disease?:  No Injury to kidneys or bladder?: No Painful intercourse?: No Weak stream?: No Erection problems?: No Penile pain?: No  Gastrointestinal Nausea?: No Vomiting?: No Indigestion/heartburn?: No Diarrhea?: No Constipation?: Yes  Constitutional Fever: No Night sweats?: No Weight loss?: Yes Fatigue?: Yes  Skin Skin rash/lesions?: No Itching?: No  Eyes Blurred vision?: Yes Double vision?: No  Ears/Nose/Throat Sore throat?: No Sinus problems?: No  Hematologic/Lymphatic Swollen glands?: No Easy bruising?: Yes  Cardiovascular Leg swelling?: No Chest pain?: No  Respiratory Cough?: No Shortness of breath?: No  Endocrine Excessive thirst?: No  Musculoskeletal Back pain?: Yes Joint pain?: Yes  Neurological Headaches?: No Dizziness?: No  Psychologic Depression?: No Anxiety?: No  Physical Exam: BP (!) 154/89 (BP Location: Left Arm, Patient Position: Sitting, Cuff Size: Normal)   Pulse 79   Ht 6\' 1"  (1.854 m)   Wt 161 lb 6.4 oz (73.2 kg)   BMI 21.29 kg/m   Constitutional:  Alert and oriented, No acute distress. HEENT: [place] AT, moist mucus membranes.  Trachea midline, no masses. Cardiovascular: No clubbing, cyanosis, or edema.  Respiratory: Normal respiratory effort, no increased work of breathing. Skin: No rashes, bruises or suspicious lesions. Neurologic: Grossly intact, no focal deficits, moving all 4 extremities. Psychiatric: Normal mood and affect.   Assessment & Plan:    - Hypogonadism in male PSA, testosterone and hematocrit were drawn.  If stable follow-up in 6 months.  If no significant abnormalities would recommend he see his PCP regarding his symptoms of weakness and weight loss.  - Lower urinary tract symptoms Urinalysis was ordered.  - History prostate cancer PSA ordered   Return in about 6 months (around 04/17/2019) for Recheck.  Abbie Sons, [place] [address] [place], [place] [address] [phone]

## 2018-10-18 ENCOUNTER — Telehealth: Payer: Self-pay

## 2018-10-18 ENCOUNTER — Encounter: Payer: Self-pay | Admitting: Urology

## 2018-10-18 LAB — TESTOSTERONE: Testosterone: 559 ng/dL (ref 264–916)

## 2018-10-18 LAB — PSA: Prostate Specific Ag, Serum: <0.1 ng/mL (ref 0.0–4.0)

## 2018-10-18 LAB — HEMATOCRIT: Hematocrit: 44.1 % (ref 37.5–51.0)

## 2018-10-18 NOTE — Telephone Encounter (Signed)
Called pt informed him of the information below. Pt gave verbal understanding.  

## 2018-10-18 NOTE — Telephone Encounter (Signed)
-----   Message from Abbie Sons, MD sent at 10/18/2018  7:17 AM EDT ----- Please let patient know his urinalysis was normal.

## 2018-11-01 DIAGNOSIS — D1800 Hemangioma unspecified site: Secondary | ICD-10-CM | POA: Diagnosis not present

## 2018-11-01 DIAGNOSIS — L821 Other seborrheic keratosis: Secondary | ICD-10-CM | POA: Diagnosis not present

## 2018-11-01 DIAGNOSIS — D225 Melanocytic nevi of trunk: Secondary | ICD-10-CM | POA: Diagnosis not present

## 2018-11-01 DIAGNOSIS — L570 Actinic keratosis: Secondary | ICD-10-CM | POA: Diagnosis not present

## 2018-11-06 ENCOUNTER — Telehealth: Payer: Self-pay | Admitting: Family Medicine

## 2018-11-06 NOTE — Chronic Care Management (AMB) (Deleted)
°  Chronic Care Management   Outreach Note  11/06/2018 Name: [name] MRN: [id number] DOB: 02-06-1932  Referred by: [name], DO Reason for referral : Chronic Care Management (Initial CCM outreach was unsuccessful. )   An unsuccessful telephone outreach was attempted today. The patient was referred to the case management team by for assistance with care management and care coordination.   Follow Up Plan: A HIPPA compliant phone message was left for the patient providing contact information and requesting a return call.  The care management team will reach out to the patient again over the next 7 days.  If patient returns call to provider office, please advise to call [facility] [place]  [email or web address]   ??[phone]

## 2018-11-06 NOTE — Chronic Care Management (AMB) (Signed)
°  Chronic Care Management   Outreach Note  11/06/2018 Name: [name] MRN: [id number] DOB: Jan 14, 1932  Referred by: [name], DO Reason for referral : Chronic Care Management (Initial CCM outreach.)   An unsuccessful telephone outreach was attempted today. The patient was referred to the case management team by for assistance with care management and care coordination.   Follow Up Plan: A HIPPA compliant phone message was left for the patient providing contact information and requesting a return call.  The care management team will reach out to the patient again over the next 7 days.  If patient returns call to provider office, please advise to call [facility]  [email or web address]   ??[phone]

## 2018-11-16 ENCOUNTER — Encounter: Payer: Self-pay | Admitting: Family Medicine

## 2018-11-16 ENCOUNTER — Ambulatory Visit (INDEPENDENT_AMBULATORY_CARE_PROVIDER_SITE_OTHER): Payer: PPO | Admitting: Family Medicine

## 2018-11-16 ENCOUNTER — Other Ambulatory Visit: Payer: Self-pay

## 2018-11-16 VITALS — BP 127/68 | HR 71 | Temp 98.1°F | Resp 16 | Ht 73.0 in | Wt 160.8 lb

## 2018-11-16 DIAGNOSIS — M545 Low back pain, unspecified: Secondary | ICD-10-CM | POA: Insufficient documentation

## 2018-11-16 DIAGNOSIS — R1310 Dysphagia, unspecified: Secondary | ICD-10-CM

## 2018-11-16 DIAGNOSIS — Z0000 Encounter for general adult medical examination without abnormal findings: Secondary | ICD-10-CM

## 2018-11-16 DIAGNOSIS — K219 Gastro-esophageal reflux disease without esophagitis: Secondary | ICD-10-CM | POA: Diagnosis not present

## 2018-11-16 DIAGNOSIS — R1319 Other dysphagia: Secondary | ICD-10-CM

## 2018-11-16 DIAGNOSIS — G8929 Other chronic pain: Secondary | ICD-10-CM | POA: Insufficient documentation

## 2018-11-16 MED ORDER — PANTOPRAZOLE SODIUM 40 MG PO TBEC: 40.0000 mg | DELAYED_RELEASE_TABLET | Freq: Every day | ORAL | 3 refills | Status: DC

## 2018-11-16 MED ORDER — BACLOFEN 10 MG PO TABS: 5.0000 mg | ORAL_TABLET | Freq: Three times a day (TID) | ORAL | 3 refills | Status: DC | PRN

## 2018-11-16 NOTE — Patient Instructions (Addendum)
Thank you for coming to the office today.  Keep up the great work overall.  REfilled Baclofen and Pantoprazole (stomach acid) to help prevent future problem.   DUE for FASTING BLOOD WORK (no food or drink after midnight before the lab appointment, only water or coffee without cream/sugar on the morning of)  SCHEDULE "Lab Only" visit in the morning at the clinic for lab draw in 6 MONTHS   - Make sure Lab Only appointment is at about 1 week before your next appointment, so that results will be available  For Lab Results, once available within 2-3 days of blood draw, you can can log in to MyChart online to view your results and a brief explanation. Also, we can discuss results at next follow-up visit.   Please schedule a Follow-up Appointment to: Return in about 6 months (around 05/16/2019) for 6 month follow-up lab results.  If you have any other questions or concerns, please feel free to call the office or send a message through [facility]. You may also schedule an earlier appointment if necessary.  Additionally, you may be receiving a survey about your experience at our office within a few days to 1 week by e-mail or mail. We value your feedback.  [name], DO [facility]

## 2018-11-16 NOTE — Progress Notes (Signed)
Subjective:    Patient ID: [name], male    DOB: 15-Feb-1932, 85 y.o.   MRN: [id number]  [name] is a 85 y.o. male presenting on 11/16/2018 for Annual Exam   HPI   Here for Annual Physical and Lab Review.   Pre-Diabetes Previously controlled CBGs: not checking Meds: none Currently not on ACEi / ARB Lifestyle: - Diet (limited sugars, improving diet) - Exercise (walking) Denies hypoglycemia, polyuria, visual changes, numbness or tingling.  GERD / Dysphagia w/ esophageal stricture - He had recent history of esophageal stricture on barium swallow done in 09/2017. He had EGD and esophageal stretching in 10/2017 by Dr Allen Norris with improvement initially then seemed less improved. He was rx Pantoprazole 40mg  daily with again some improve at first, now only marginal improvement. Still has some choking or dysphagia, but has not had regurgitation, they want to wait before repeat procedure or other intervention has not returned in follow-up. - Request refill on med.  Chronic Osteoarthritis, Back Pain Follow-up on chronic issue, he has tried some Tylenol Arthritis str 1-2 times a day with mild relief, not taking higher doses, he stopped baclofen previously due to swallowing issues. Interested to try again. Denies any injury, fall, numbness tingling weakness  Health Maintenance: UTD Flu vaccine already 08/2018  Depression screen [facility] 2/9 11/16/2018 12/19/2017 06/07/2017  Decreased Interest 0 0 1  Down, Depressed, Hopeless 0 0 0  PHQ - 2 Score 0 0 1  Difficult doing work/chores - - Somewhat difficult    Past Medical History:  Diagnosis Date  . Arthritis   . Coronary artery disease    a. 1993 s/p PCI/BMS LAD;  b. 2003 PCI RCA;  c. 09/2006 ISR LAD->PCI, PCI RCA;  d. 02/2007 MV: no ischemia;  d. 01/2012 MV: EF 74%, no ischemia.  [name] History of prostate cancer   . Hyperlipidemia   . Hyperthyroidism   . Low testosterone   . PAF (paroxysmal atrial fibrillation) ([place])    a.  previously on amio ->stopped in 2013;  b. recurrent PAF 01/2012->Pradaxa and amio initiated, subsequently came off of pradaxa 2/2 cost;  c. 02/2012 Echo: EF 55-60%, Gr 1 DD.  [name] Prostate cancer ([place])   . S/P CABG x 4    Past Surgical History:  Procedure Laterality Date  . APPENDECTOMY     Dr. Bary Castilla  . CARDIAC CATHETERIZATION  2008  . CLIPPING OF ATRIAL APPENDAGE Left 05/18/2012   Procedure: CLIPPING OF ATRIAL APPENDAGE;  Surgeon: [name], MD;  Location: [facility];  Service: Open Heart Surgery;  Laterality: Left;  . COLONOSCOPY  2013  . COLONOSCOPY WITH PROPOFOL N/A 07/12/2016   Procedure: COLONOSCOPY WITH PROPOFOL;  Surgeon: [name], MD;  Location: [facility] ENDOSCOPY;  Service: Endoscopy;  Laterality: N/A;  . CORONARY ARTERY BYPASS GRAFT N/A 05/18/2012   Procedure: CORONARY ARTERY BYPASS GRAFTING (CABG);  Surgeon: [name], MD;  Location: [facility];  Service: Open Heart Surgery;  Laterality: N/A;  Times 4 using left internal mammary artery and endoscopically harvested right saphenous vein  . ESOPHAGOGASTRODUODENOSCOPY (EGD) WITH PROPOFOL N/A 10/24/2017   Procedure: ESOPHAGOGASTRODUODENOSCOPY (EGD) WITH PROPOFOL;  Surgeon: [name], MD;  Location: [facility] ENDOSCOPY;  Service: Endoscopy;  Laterality: N/A;  . HERNIA REPAIR     s/p mesh bilaterally, Dr. Bary Castilla  . INTRAOPERATIVE TRANSESOPHAGEAL ECHOCARDIOGRAM N/A 05/18/2012   Procedure: INTRAOPERATIVE TRANSESOPHAGEAL ECHOCARDIOGRAM;  Surgeon: [name], MD;  Location: [facility];  Service: Open Heart Surgery;  Laterality: N/A;  .  IR ESOPHAGUS DILITATION RETRO FLUORO    . LEFT HEART CATHETERIZATION WITH CORONARY ANGIOGRAM Bilateral 05/16/2012   Procedure: LEFT HEART CATHETERIZATION WITH CORONARY ANGIOGRAM;  Surgeon: Burnell Blanks, MD;  Location: [facility] CATH LAB;  Service: Cardiovascular;  Laterality: Bilateral;  . MAZE N/A 05/18/2012   Procedure: MAZE;  Surgeon: [name], MD;  Location: [facility];  Service: Open Heart Surgery;  Laterality:  N/A;  . MOHS SURGERY    . PROSTATECTOMY    . TOTAL HIP ARTHROPLASTY Right    Dr. [name]   Social History   Socioeconomic History  . Marital status: Married    Spouse name: [name] Hucks  . Number of children: 1  . Years of education: JD  . Highest education level: Bachelor's degree (e.g., BA, AB, BS)  Occupational History  . 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 Occupation: Former Press photographer  . Financial resource strain: Not hard at all  . Food insecurity    Worry: Never true    Inability: Never true  . Transportation needs    Medical: No    Non-medical: No  Tobacco Use  . Smoking status: Former Smoker    Types: Cigars    Quit date: 10/04/1988    Years since quitting: 30.1  . Smokeless tobacco: Former Systems developer    Types: [place] date: 09/04/1991  Substance and Sexual Activity  . Alcohol use: Yes    Alcohol/week: 7.0 standard drinks    Types: 7 Glasses of wine per week  . Drug use: No  . Sexual activity: Not Currently  Lifestyle  . Physical activity    Days per week: 3 days    Minutes per session: 20 min  . Stress: Not at all  Relationships  . Social Herbalist on phone: Once a week    Gets together: Once a week    Attends religious service: More than 4 times per year    Active member of club or organization: No    Attends meetings of clubs or organizations: Never    Relationship status: Married  . Intimate partner violence    Fear of current or ex partner: No    Emotionally abused: No    Physically abused: No    Forced sexual activity: No  Other Topics Concern  . Not on file  Social History Narrative   Lives in [place] with wife. He was born in [place] and went to [place] in [place]. Daughter in [place], 2 step-daughters. Retired from [employer].   Family History  Problem Relation Age of Onset  . Coronary artery disease Mother   . Heart disease Mother   . Coronary artery  disease Sister   . Diabetes Sister   . Heart disease Sister 63       deceased  . Diabetes Father   . Heart disease Brother    Current Outpatient Medications on File Prior to Visit  Medication Sig  . Acetaminophen (TYLENOL ARTHRITIS PAIN PO) Take by mouth as needed.  [name] aspirin EC 81 MG tablet Take 81 mg by mouth every evening.  . bisacodyl (DULCOLAX) 10 MG suppository Place 10 mg rectally daily as needed for moderate constipation.  . Cholecalciferol (VITAMIN D3) 1.25 MG (50000 UT) CAPS Take by mouth daily.  [name] ezetimibe (ZETIA) 10 MG tablet TAKE ONE TABLET BY MOUTH DAILY  . hydrocortisone 2.5 % cream   .  latanoprost (XALATAN) 0.005 % ophthalmic solution   . polyethylene glycol (MIRALAX / GLYCOLAX) packet Take 17 g by mouth daily as needed.  . testosterone cypionate (DEPOTESTOSTERONE CYPIONATE) 200 MG/ML injection Inject 0.3 mLs (60 mg total) into the muscle every 14 (fourteen) days.  [name] timolol (TIMOPTIC) 0.5 % ophthalmic solution Place 1 drop into both eyes daily.    No current facility-administered medications on file prior to visit.     Review of Systems  Constitutional: Negative for activity change, appetite change, chills, diaphoresis, fatigue and fever.  HENT: Negative for congestion and hearing loss.   Eyes: Negative for visual disturbance.  Respiratory: Negative for cough, chest tightness, shortness of breath and wheezing.   Cardiovascular: Negative for chest pain, palpitations and leg swelling.  Gastrointestinal: Negative for abdominal pain, constipation, diarrhea, nausea and vomiting.  Genitourinary: Negative for dysuria, frequency and hematuria.  Musculoskeletal: Negative for arthralgias and neck pain.  Skin: Negative for rash.  Neurological: Negative for dizziness, weakness, light-headedness, numbness and headaches.  Hematological: Negative for adenopathy.  Psychiatric/Behavioral: Negative for behavioral problems, dysphoric mood and sleep disturbance.   Per HPI unless  specifically indicated above      Objective:    BP 127/68   Pulse 71   Temp 98.1 F (36.7 C) (Oral)   Resp 16   Ht 6\' 1"  (1.854 m)   Wt 160 lb 12.8 oz (72.9 kg)   BMI 21.22 kg/m   Wt Readings from Last 3 Encounters:  11/16/18 160 lb 12.8 oz (72.9 kg)  10/17/18 161 lb 6.4 oz (73.2 kg)  12/19/17 160 lb 6.4 oz (72.8 kg)    Physical Exam Vitals signs and nursing note reviewed.  Constitutional:      General: He is not in acute distress.    Appearance: He is well-developed. He is not diaphoretic.     Comments: Well-appearing, comfortable, cooperative  HENT:     Head: Normocephalic and atraumatic.  Eyes:     General:        Right eye: No discharge.        Left eye: No discharge.     Conjunctiva/sclera: Conjunctivae normal.     Pupils: Pupils are equal, round, and reactive to light.  Neck:     Musculoskeletal: Normal range of motion and neck supple.     Thyroid: No thyromegaly.  Cardiovascular:     Rate and Rhythm: Normal rate and regular rhythm.     Heart sounds: Normal heart sounds. No murmur.  Pulmonary:     [place]: Pulmonary effort is normal. No respiratory distress.     Breath sounds: Normal breath sounds. No wheezing or rales.  Abdominal:     General: Bowel sounds are normal. There is no distension.     Palpations: Abdomen is soft. There is no mass.     Tenderness: There is no abdominal tenderness.  Musculoskeletal: Normal range of motion.        General: No tenderness.     Comments: Upper / Lower Extremities: - Normal muscle tone, strength bilateral upper extremities 5/5, lower extremities 5/5  Lymphadenopathy:     Cervical: No cervical adenopathy.  Skin:    General: Skin is warm and dry.     Findings: No erythema or rash.  Neurological:     Mental Status: He is alert and oriented to person, place, and time.     Comments: Distal sensation intact to light touch all extremities  Psychiatric:        Behavior: Behavior normal.  Comments: Well groomed, good  eye contact, normal speech and thoughts    Results for orders placed or performed in visit on 10/17/18  Microscopic Examination   URINE  Result Value Ref Range   WBC, UA None seen 0 - 5 /hpf   RBC None seen 0 - 2 /hpf   Epithelial Cells (non renal) None seen 0 - 10 /hpf   Bacteria, UA None seen None seen/Few  PSA  Result Value Ref Range   Prostate Specific Ag, Serum <0.1 0.0 - 4.0 ng/mL  Testosterone  Result Value Ref Range   Testosterone 559 264 - 916 ng/dL  Hematocrit  Result Value Ref Range   Hematocrit 44.1 37.5 - 51.0 %  Urinalysis, Complete  Result Value Ref Range   Specific Gravity, UA >1.030 (H) 1.005 - 1.030   pH, UA 6.0 5.0 - 7.5   Color, UA Yellow Yellow   Appearance Ur Clear Clear   Leukocytes,UA Negative Negative   Protein,UA Negative Negative/Trace   Glucose, UA Negative Negative   Ketones, UA Negative Negative   RBC, UA Negative Negative   Bilirubin, UA Negative Negative   Urobilinogen, Ur 2.0 (H) 0.2 - 1.0 mg/dL   Nitrite, UA Negative Negative   Microscopic Examination See below:       Assessment & Plan:   Problem List Items Addressed This Visit    Gastroesophageal reflux disease without esophagitis   Relevant Medications   pantoprazole (PROTONIX) 40 MG tablet   Esophageal dysphagia   Relevant Medications   pantoprazole (PROTONIX) 40 MG tablet   Chronic right-sided low back pain without sciatica   Relevant Medications   baclofen (LIORESAL) 10 MG tablet    Other Visit Diagnoses    Annual physical exam    -  Primary      Updated Health Maintenance information Reviewed recent lab results with patient Encouraged improvement to lifestyle with diet and exercise - Goal of weight loss   Meds ordered this encounter  Medications  . baclofen (LIORESAL) 10 MG tablet    Sig: Take 0.5-1 tablets (5-10 mg total) by mouth 3 (three) times daily as needed for muscle spasms.    Dispense:  30 each    Refill:  3  . pantoprazole (PROTONIX) 40 MG tablet     Sig: Take 1 tablet (40 mg total) by mouth daily before breakfast.    Dispense:  90 tablet    Refill:  3     Follow up plan: Return in about 6 months (around 05/16/2019) for 6 month follow-up lab results.  Follow-up 6 months - CMET CBC Lipid  [name], DO [facility] 11/16/2018, 2:50 PM

## 2018-11-18 ENCOUNTER — Other Ambulatory Visit: Payer: Self-pay | Admitting: Family Medicine

## 2018-11-18 DIAGNOSIS — I10 Essential (primary) hypertension: Secondary | ICD-10-CM

## 2018-11-18 DIAGNOSIS — E782 Mixed hyperlipidemia: Secondary | ICD-10-CM

## 2018-11-18 DIAGNOSIS — R7303 Prediabetes: Secondary | ICD-10-CM

## 2018-12-05 ENCOUNTER — Other Ambulatory Visit: Payer: Self-pay

## 2018-12-05 ENCOUNTER — Telehealth: Payer: Self-pay | Admitting: Family Medicine

## 2018-12-05 DIAGNOSIS — E041 Nontoxic single thyroid nodule: Secondary | ICD-10-CM

## 2018-12-05 NOTE — Telephone Encounter (Signed)
Notified to the patient's spouse.

## 2018-12-05 NOTE — Telephone Encounter (Signed)
Referral to Dr Belinda Fisher, previously established for same problem thyroid nodule.  [name], DO [facility] 12/05/2018, 2:12 PM

## 2018-12-05 NOTE — Telephone Encounter (Signed)
Patient's spouse was asking to get referral for endocrinologist thinks his sxs like weight loss and raspy voice is coming back how he had in past with thyroid nodule, he had seen Dr. Boykin Peek in past requesting same one, if you have any question you can call the patient and let me know if I have to schedule appointment his appointment. As per wife they don't need referral for insurance purpose but think if PCP send referral then he can be seen sooner. Please suggest ,he don't have appointment till next year in Spring.

## 2018-12-13 DIAGNOSIS — L820 Inflamed seborrheic keratosis: Secondary | ICD-10-CM | POA: Diagnosis not present

## 2018-12-13 DIAGNOSIS — L219 Seborrheic dermatitis, unspecified: Secondary | ICD-10-CM | POA: Diagnosis not present

## 2018-12-13 DIAGNOSIS — L570 Actinic keratosis: Secondary | ICD-10-CM | POA: Diagnosis not present

## 2018-12-16 ENCOUNTER — Other Ambulatory Visit: Payer: Self-pay | Admitting: Cardiovascular Disease

## 2018-12-17 NOTE — Telephone Encounter (Signed)
Please schedule F/U appointment with Dr. Gollan. Thank you! 

## 2018-12-18 ENCOUNTER — Telehealth: Payer: Self-pay | Admitting: Cardiovascular Disease

## 2018-12-18 NOTE — Telephone Encounter (Signed)
Scheduled

## 2018-12-18 NOTE — Telephone Encounter (Signed)
Left voicemail message requesting to have patient call back to schedule.

## 2018-12-18 NOTE — Telephone Encounter (Signed)
Virtual Visit Pre-Appointment Phone Call  "(Name), I am calling you today to discuss your upcoming appointment. We are currently trying to limit exposure to the virus that causes COVID-19 by seeing patients at home rather than in the office."  1. "What is the BEST phone number to call the day of the visit?" - include this in appointment notes  2. Do you have or have access to (through a family member/friend) a smartphone with video capability that we can use for your visit?" a. If yes - list this number in appt notes as cell (if different from BEST phone #) and list the appointment type as a VIDEO visit in appointment notes b. If no - list the appointment type as a PHONE visit in appointment notes  3. Confirm consent - "In the setting of the current Covid19 crisis, you are scheduled for a (phone or video) visit with your provider on (date) at (time).  Just as we do with many in-office visits, in order for you to participate in this visit, we must obtain consent.  If you'd like, I can send this to your mychart (if signed up) or email for you to review.  Otherwise, I can obtain your verbal consent now.  All virtual visits are billed to your insurance company just like a normal visit would be.  By agreeing to a virtual visit, we'd like you to understand that the technology does not allow for your provider to perform an examination, and thus may limit your provider's ability to fully assess your condition. If your provider identifies any concerns that need to be evaluated in person, we will make arrangements to do so.  Finally, though the technology is pretty good, we cannot assure that it will always work on either your or our end, and in the setting of a video visit, we may have to convert it to a phone-only visit.  In either situation, we cannot ensure that we have a secure connection.  Are you willing to proceed?" STAFF: Did the patient verbally acknowledge consent to telehealth visit? Document  YES/NO here: YES  4. Advise patient to be prepared - "Two hours prior to your appointment, go ahead and check your blood pressure, pulse, oxygen saturation, and your weight (if you have the equipment to check those) and write them all down. When your visit starts, your provider will ask you for this information. If you have an Apple Watch or Kardia device, please plan to have heart rate information ready on the day of your appointment. Please have a pen and paper handy nearby the day of the visit as well."  5. Give patient instructions for MyChart download to smartphone OR Doximity/Doxy.me as below if video visit (depending on what platform provider is using)  6. Inform patient they will receive a phone call 15 minutes prior to their appointment time (may be from unknown caller ID) so they should be prepared to answer    TELEPHONE CALL NOTE  [name] has been deemed a candidate for a follow-up tele-health visit to limit community exposure during the Covid-19 pandemic. I spoke with the patient via phone to ensure availability of phone/video source, confirm preferred email & phone number, and discuss instructions and expectations.  I reminded [name] to be prepared with any vital sign and/or heart rhythm information that could potentially be obtained via home monitoring, at the time of his visit. I reminded [name] to expect a phone call prior to  his visit.  [name] 12/18/2018 2:01 PM   INSTRUCTIONS FOR DOWNLOADING THE MYCHART APP TO SMARTPHONE  - The patient must first make sure to have activated MyChart and know their login information - If Apple, go to [employer] and type in MyChart in the search bar and download the app. If Android, ask patient to go to [employer] and type in [place] in the search bar and download the app. The app is free but as with any other app downloads, their phone may require them to verify saved payment information or Apple/Android  password.  - The patient will need to then log into the app with their MyChart username and password, and select [facility] as their healthcare provider to link the account. When it is time for your visit, go to the MyChart app, find appointments, and click Begin Video Visit. Be sure to Select Allow for your device to access the Microphone and Camera for your visit. You will then be connected, and your provider will be with you shortly.  **If they have any issues connecting, or need assistance please contact MyChart service desk [phone])**  **If using a computer, in order to ensure the best quality for their visit they will need to use either of the following Internet Browsers: [employer], or Google Chrome**  IF USING DOXIMITY or DOXY.ME - The patient will receive a link just prior to their visit by text.     FULL LENGTH CONSENT FOR TELE-HEALTH VISIT   I hereby voluntarily request, consent and authorize [facility] and its employed or contracted physicians, physician assistants, nurse practitioners or other licensed health care professionals (the Practitioner), to provide me with telemedicine health care services (the Services") as deemed necessary by the treating Practitioner. I acknowledge and consent to receive the Services by the Practitioner via telemedicine. I understand that the telemedicine visit will involve communicating with the Practitioner through live audiovisual communication technology and the disclosure of certain medical information by electronic transmission. I acknowledge that I have been given the opportunity to request an in-person assessment or other available alternative prior to the telemedicine visit and am voluntarily participating in the telemedicine visit.  I understand that I have the right to withhold or withdraw my consent to the use of telemedicine in the course of my care at any time, without affecting my right to future care or treatment,  and that the Practitioner or I may terminate the telemedicine visit at any time. I understand that I have the right to inspect all information obtained and/or recorded in the course of the telemedicine visit and may receive copies of available information for a reasonable fee.  I understand that some of the potential risks of receiving the Services via telemedicine include:   Delay or interruption in medical evaluation due to technological equipment failure or disruption;  Information transmitted may not be sufficient (e.g. poor resolution of images) to allow for appropriate medical decision making by the Practitioner; and/or   In rare instances, security protocols could fail, causing a breach of personal health information.  Furthermore, I acknowledge that it is my responsibility to provide information about my medical history, conditions and care that is complete and accurate to the best of my ability. I acknowledge that Practitioner's advice, recommendations, and/or decision may be based on factors not within their control, such as incomplete or inaccurate data provided by me or distortions of diagnostic images or specimens that may result from electronic transmissions. I  understand that the practice of medicine is not an exact science and that Practitioner makes no warranties or guarantees regarding treatment outcomes. I acknowledge that I will receive a copy of this consent concurrently upon execution via email to the email address I last provided but may also request a printed copy by calling the office of [facility].    I understand that my insurance will be billed for this visit.   I have read or had this consent read to me.  I understand the contents of this consent, which adequately explains the benefits and risks of the Services being provided via telemedicine.   I have been provided ample opportunity to ask questions regarding this consent and the Services and have had my questions  answered to my satisfaction.  I give my informed consent for the services to be provided through the use of telemedicine in my medical care  By participating in this telemedicine visit I agree to the above.

## 2018-12-19 ENCOUNTER — Other Ambulatory Visit: Payer: Self-pay

## 2018-12-19 ENCOUNTER — Encounter: Payer: Self-pay | Admitting: Cardiovascular Disease

## 2018-12-19 ENCOUNTER — Telehealth (INDEPENDENT_AMBULATORY_CARE_PROVIDER_SITE_OTHER): Payer: PPO | Admitting: Cardiovascular Disease

## 2018-12-19 VITALS — HR 80 | Ht 73.0 in | Wt 160.0 lb

## 2018-12-19 DIAGNOSIS — I10 Essential (primary) hypertension: Secondary | ICD-10-CM | POA: Diagnosis not present

## 2018-12-19 DIAGNOSIS — I480 Paroxysmal atrial fibrillation: Secondary | ICD-10-CM | POA: Diagnosis not present

## 2018-12-19 DIAGNOSIS — I25118 Atherosclerotic heart disease of native coronary artery with other forms of angina pectoris: Secondary | ICD-10-CM

## 2018-12-19 DIAGNOSIS — I714 Abdominal aortic aneurysm, without rupture, unspecified: Secondary | ICD-10-CM

## 2018-12-19 DIAGNOSIS — E782 Mixed hyperlipidemia: Secondary | ICD-10-CM

## 2018-12-19 DIAGNOSIS — I700 Atherosclerosis of aorta: Secondary | ICD-10-CM | POA: Diagnosis not present

## 2018-12-19 MED ORDER — EZETIMIBE 10 MG PO TABS: 10.0000 mg | ORAL_TABLET | Freq: Every day | ORAL | 3 refills | Status: DC

## 2018-12-19 MED ORDER — SIMVASTATIN 20 MG PO TABS: 20.0000 mg | ORAL_TABLET | Freq: Every day | ORAL | 3 refills | Status: DC

## 2018-12-19 NOTE — Progress Notes (Signed)
Virtual Visit via Video Note   This visit type was conducted due to national recommendations for restrictions regarding the COVID-19 Pandemic (e.g. social distancing) in an effort to limit this patient's exposure and mitigate transmission in our community.  Due to his co-morbid illnesses, this patient is at least at moderate risk for complications without adequate follow up.  This format is felt to be most appropriate for this patient at this time.  All issues noted in this document were discussed and addressed.  A limited physical exam was performed with this format.  Please refer to the patient's chart for his consent to telehealth for [facility].   I connected with  [name] on 12/19/18 by a video enabled telemedicine application and verified that I am speaking with the correct person using two identifiers. I discussed the limitations of evaluation and management by telemedicine. The patient expressed understanding and agreed to proceed.   Evaluation Performed:  Follow-up visit  Date:  12/19/2018   ID:  [name], DOB Dec 31, 1932, MRN [id number]  Patient Location:  [address] [place] [address]   Provider location:   [facility], [place] office  PCP:  [name], DO  Cardiologist:  Patsy Baltimore   Chief Complaint:  Leg weakness   History of Present Illness:    [name] is a 85 y.o. male who presents via audio/video conferencing for a telehealth visit today.   The patient does not symptoms concerning for COVID-19 infection (fever, chills, cough, or new SHORTNESS OF BREATH).   Patient has a past medical history of coronary artery disease, bypass surgery may 2014  atrial fibrillation, previously on amiodarone, s/p maze stent placed to his LAD in 1993, stent placed to his mid RCA in 2003,  in-stent restenosis with repeat stent placed to his LAD in September 08,  stent placed to his RCA at the same time,  stress test in  February 2009 and Jan 2014 which showed no ischemia,  low testosterone  Constipation Back pain He presents for routine followup of his coronary artery disease  Ran out of simvastatin, did not realize it Cholesterol jumped 174 from 113 On zetia  Recent fall over summer, hurt hip Very active but muscle loss in legs  Denies any symptoms of chest pain concerning for angina  Previous Lab work  Total chol 174, LDL 110 , ran out of simvastatin Hemoglobin A1c 5.6   Other past medical history Previous episode of dizziness in 2015 dizziness. He was unable to get out of bed without assistance. Dizziness occurred while supine as well as sitting and standing. He went to the emergency room, was told that he could be dehydrated and was given IV fluids x2 bags. This seemed to improve his symptoms and was discharged home. Other workup in the hospital was negative including negative cardiac enzymes, normal basic metabolic panel, normal LFTs, normal EKG  Previously had chest pain in may 2014. Presented to [facility], cardiac catheterization showing subtotal occlusion of his LAD stent, in-stent restenosis, severe OM1 and OM 2 disease also with RCA disease, On 05/18/2012. He underwent CABG x 4 utilizing LIMA to LAD, SVG to OM1, SVG to OM2, and SVG to PDA. He also underwent Complete MAZE procedure with Clipping of Left Atrial Appendage.   He did not tolerate beta blockers or ACE inhibitor he had malaise, bradycardia. He took himself off these medications.   In January 2014 he had an episode of  atrial fibrillation and presented to the emergency room. Converted back to normal sinus rhythm. Discharged on pradaxa. Recurrent episodes of atrial fibrillation shortly after that.   Prior CV studies:   The following studies were reviewed today:    Past Medical History:  Diagnosis Date  . Arthritis   . Coronary artery disease    a. 1993 s/p PCI/BMS LAD;  b. 2003 PCI RCA;  c. 09/2006 ISR LAD->PCI, PCI  RCA;  d. 02/2007 MV: no ischemia;  d. 01/2012 MV: EF 74%, no ischemia.  [name] History of prostate cancer   . Hyperlipidemia   . Hyperthyroidism   . Low testosterone   . PAF (paroxysmal atrial fibrillation) ([place])    a. previously on amio ->stopped in 2013;  b. recurrent PAF 01/2012->Pradaxa and amio initiated, subsequently came off of pradaxa 2/2 cost;  c. 02/2012 Echo: EF 55-60%, Gr 1 DD.  [name] Prostate cancer ([place])   . S/P CABG x 4    Past Surgical History:  Procedure Laterality Date  . APPENDECTOMY     Dr. Bary Castilla  . CARDIAC CATHETERIZATION  2008  . CLIPPING OF ATRIAL APPENDAGE Left 05/18/2012   Procedure: CLIPPING OF ATRIAL APPENDAGE;  Surgeon: [name], MD;  Location: [facility];  Service: Open Heart Surgery;  Laterality: Left;  . COLONOSCOPY  2013  . COLONOSCOPY WITH PROPOFOL N/A 07/12/2016   Procedure: COLONOSCOPY WITH PROPOFOL;  Surgeon: [name], MD;  Location: [facility] ENDOSCOPY;  Service: Endoscopy;  Laterality: N/A;  . CORONARY ARTERY BYPASS GRAFT N/A 05/18/2012   Procedure: CORONARY ARTERY BYPASS GRAFTING (CABG);  Surgeon: [name], MD;  Location: [facility];  Service: Open Heart Surgery;  Laterality: N/A;  Times 4 using left internal mammary artery and endoscopically harvested right saphenous vein  . ESOPHAGOGASTRODUODENOSCOPY (EGD) WITH PROPOFOL N/A 10/24/2017   Procedure: ESOPHAGOGASTRODUODENOSCOPY (EGD) WITH PROPOFOL;  Surgeon: [name], MD;  Location: [facility] ENDOSCOPY;  Service: Endoscopy;  Laterality: N/A;  . HERNIA REPAIR     s/p mesh bilaterally, Dr. Bary Castilla  . INTRAOPERATIVE TRANSESOPHAGEAL ECHOCARDIOGRAM N/A 05/18/2012   Procedure: INTRAOPERATIVE TRANSESOPHAGEAL ECHOCARDIOGRAM;  Surgeon: [name], MD;  Location: [facility];  Service: Open Heart Surgery;  Laterality: N/A;  . IR ESOPHAGUS DILITATION RETRO FLUORO    . LEFT HEART CATHETERIZATION WITH CORONARY ANGIOGRAM Bilateral 05/16/2012   Procedure: LEFT HEART CATHETERIZATION WITH CORONARY ANGIOGRAM;  Surgeon: Burnell Blanks, MD;  Location: [facility] CATH LAB;  Service: Cardiovascular;  Laterality: Bilateral;  . MAZE N/A 05/18/2012   Procedure: MAZE;  Surgeon: [name], MD;  Location: [facility];  Service: Open Heart Surgery;  Laterality: N/A;  . MOHS SURGERY    . PROSTATECTOMY    . TOTAL HIP ARTHROPLASTY Right    Dr. [name]     Allergies:   Ambien [zolpidem tartrate], Pravastatin sodium, Rosuvastatin calcium, Statins, and Zolpidem tartrate   Social History   Tobacco Use  . Smoking status: Former Smoker    Types: Cigars    Quit date: 10/04/1988    Years since quitting: 30.2  . Smokeless tobacco: Former Systems developer    Types: Chew    Quit date: 09/04/1991  Substance Use Topics  . Alcohol use: Yes    Alcohol/week: 7.0 standard drinks    Types: 7 Glasses of wine per week  . Drug use: No     Current Outpatient Medications on File Prior to Visit  Medication Sig Dispense Refill  . Acetaminophen (TYLENOL ARTHRITIS PAIN PO) Take by mouth as needed.    [name]  aspirin EC 81 MG tablet Take 81 mg by mouth every evening.    . baclofen (LIORESAL) 10 MG tablet Take 0.5-1 tablets (5-10 mg total) by mouth 3 (three) times daily as needed for muscle spasms. 30 each 3  . bisacodyl (DULCOLAX) 10 MG suppository Place 10 mg rectally daily as needed for moderate constipation.    . Cholecalciferol (VITAMIN D3) 1.25 MG (50000 UT) CAPS Take by mouth daily.    [name] ezetimibe (ZETIA) 10 MG tablet Take 1 tablet (10 mg total) by mouth daily. Please call [phone] to schedule appointment for further refills. Thank you! (Patient taking differently: Take 10 mg by mouth daily. ) 30 tablet 0  . hydrocortisone 2.5 % cream     . latanoprost (XALATAN) 0.005 % ophthalmic solution     . pantoprazole (PROTONIX) 40 MG tablet Take 1 tablet (40 mg total) by mouth daily before breakfast. 90 tablet 3  . polyethylene glycol (MIRALAX / GLYCOLAX) packet Take 17 g by mouth daily as needed.    . testosterone cypionate (DEPOTESTOSTERONE CYPIONATE) 200 MG/ML  injection Inject 0.3 mLs (60 mg total) into the muscle every 14 (fourteen) days. 4 mL 0  . timolol (TIMOPTIC) 0.5 % ophthalmic solution Place 1 drop into both eyes daily.      No current facility-administered medications on file prior to visit.      Family Hx: The patient's family history includes Coronary artery disease in his mother and sister; Diabetes in his father and sister; Heart disease in his brother and mother; Heart disease (age of onset: 20) in his sister.  ROS:   Please see the history of present illness.    Review of Systems  Constitutional: Negative.   HENT: Negative.   Respiratory: Negative.   Cardiovascular: Negative.   Gastrointestinal: Negative.   Musculoskeletal: Negative.   Neurological: Negative.   Psychiatric/Behavioral: Negative.   All other systems reviewed and are negative.    Labs/Other Tests and Data Reviewed:    Recent Labs: 01/19/2018: ALT 10; BUN 17; Creat 1.08; Hemoglobin 13.7; Platelets 233; Potassium 4.5; Sodium 139; TSH 2.15   Recent Lipid Panel Lab Results  Component Value Date/Time   CHOL 174 01/19/2018 09:06 AM   CHOL 138 05/23/2016 10:01 AM   CHOL 123 05/14/2012 12:28 AM   TRIG 93 01/19/2018 09:06 AM   TRIG 53 05/14/2012 12:28 AM   HDL 45 01/19/2018 09:06 AM   HDL 38 (L) 05/23/2016 10:01 AM   HDL 44 05/14/2012 12:28 AM   CHOLHDL 3.9 01/19/2018 09:06 AM   LDLCALC 110 (H) 01/19/2018 09:06 AM   LDLCALC 68 05/14/2012 12:28 AM    Wt Readings from Last 3 Encounters:  12/19/18 160 lb (72.6 kg)  11/16/18 160 lb 12.8 oz (72.9 kg)  10/17/18 161 lb 6.4 oz (73.2 kg)     Exam:    Vital Signs: Vital signs may also be detailed in the HPI Pulse 80   Ht 6\' 1"  (1.854 m)   Wt 160 lb (72.6 kg)   BMI 21.11 kg/m   Wt Readings from Last 3 Encounters:  12/19/18 160 lb (72.6 kg)  11/16/18 160 lb 12.8 oz (72.9 kg)  10/17/18 161 lb 6.4 oz (73.2 kg)   Temp Readings from Last 3 Encounters:  11/16/18 98.1 F (36.7 C) (Oral)  12/19/17 98.6 F  (37 C) (Oral)  12/19/17 98.6 F (37 C) (Oral)   BP Readings from Last 3 Encounters:  11/16/18 127/68  10/17/18 (!) 154/89  12/19/17 134/72  Pulse Readings from Last 3 Encounters:  12/19/18 80  11/16/18 71  10/17/18 79     Well nourished, well developed male in no acute distress. Constitutional:  oriented to person, place, and time. No distress.    ASSESSMENT & PLAN:    Problem List Items Addressed This Visit      Cardiology Problems   Hyperlipidemia   AAA (abdominal aortic aneurysm) without rupture (HCC)    Other Visit Diagnoses    Atherosclerosis of native coronary artery of native heart with stable angina pectoris (HCC)    -  Primary   Paroxysmal atrial fibrillation (HCC)       Aortic atherosclerosis (HCC)       HYPERTENSION, BENIGN         CAD with stable angina No chest pain, Restart simvastatin 10 to 20 mg daily, watch for muscle ache/weakness Stay on zetia  Atrial fibrillation Fall risk, Long history maintaining normal sinus rhythm Currently not on anticoagulation  Hypertension In general has been well controlled, Needs new batteries for his blood pressure cuff at home No changes made to medications   COVID-19 Education: The signs and symptoms of COVID-19 were discussed with the patient and how to seek care for testing (follow up with PCP or arrange E-visit).  The importance of social distancing was discussed today.  Patient Risk:   After full review of this patients clinical status, I feel that they are at least moderate risk at this time.  Time:   Today, I have spent 25 minutes with the patient with telehealth technology discussing the cardiac and medical problems/diagnoses detailed above   Additional 10 min spent reviewing the chart prior to patient visit today   Medication Adjustments/Labs and Tests Ordered: Current medicines are reviewed at length with the patient today.  Concerns regarding medicines are outlined above.   Tests Ordered: No  tests ordered   Medication Changes: No changes made   Disposition: Follow-up in 12 months   Signed, Ida Rogue, MD  [facility] [address] [place] [address], [place], [place] [address]

## 2018-12-19 NOTE — Patient Instructions (Addendum)
Medication Instructions:  Restart simvastatin 10 mg up to 20 mg as tolerated Watch for muscle weakness or cramping Stay on Zetia 10  If you need a refill on your cardiac medications before your next appointment, please call your pharmacy.    Lab work: No new labs needed   If you have labs (blood work) drawn today and your tests are completely normal, you will receive your results only by: [name] MyChart Message (if you have MyChart) OR . A paper copy in the mail If you have any lab test that is abnormal or we need to change your treatment, we will call you to review the results.   Testing/Procedures: No new testing needed   Follow-Up: At [facility], you and your health needs are our priority.  As part of our continuing mission to provide you with exceptional heart care, we have created designated Provider Care Teams.  These Care Teams include your primary Cardiologist (physician) and Advanced Practice Providers (APPs -  Physician Assistants and Nurse Practitioners) who all work together to provide you with the care you need, when you need it.  . You will need a follow up appointment in 6 months .   Please call our office 2 months in advance to schedule this appointment.    . Providers on your designated Care Team:   . Murray Hodgkins, NP . Christell Faith, PA-C . Marrianne Mood, PA-C  Any Other Special Instructions Will Be Listed Below (If Applicable).  For educational health videos Log in to : [email or web address] Or : [email or web address], password : [email or web address]

## 2018-12-25 ENCOUNTER — Ambulatory Visit (INDEPENDENT_AMBULATORY_CARE_PROVIDER_SITE_OTHER): Payer: PPO

## 2018-12-25 DIAGNOSIS — Z0000 Encounter for general adult medical examination without abnormal findings: Secondary | ICD-10-CM

## 2018-12-25 NOTE — Patient Instructions (Signed)
[name] , Thank you for taking time to come for your Medicare Wellness Visit. I appreciate your ongoing commitment to your health goals. Please review the following plan we discussed and let me know if I can assist you in the future.   Screening recommendations/referrals: Colonoscopy: no longer required Recommended yearly ophthalmology/optometry visit for glaucoma screening and checkup Recommended yearly dental visit for hygiene and checkup  Vaccinations: Influenza vaccine: up to date Pneumococcal vaccine: up to date Tdap vaccine: due now Shingles vaccine: shingrix  eligible  Advanced directives: Please bring a copy of your health care power of attorney and living will to the office at your convenience.  Conditions/risks identified: none  Next appointment: follow up in one year for your annual wellness visit   Preventive Care 65 Years and Older, Male Preventive care refers to lifestyle choices and visits with your health care provider that can promote health and wellness. What does preventive care include?  A yearly physical exam. This is also called an annual well check.  Dental exams once or twice a year.  Routine eye exams. Ask your health care provider how often you should have your eyes checked.  Personal lifestyle choices, including:  Daily care of your teeth and gums.  Regular physical activity.  Eating a healthy diet.  Avoiding tobacco and drug use.  Limiting alcohol use.  Practicing safe sex.  Taking low doses of aspirin every day.  Taking vitamin and mineral supplements as recommended by your health care provider. What happens during an annual well check? The services and screenings done by your health care provider during your annual well check will depend on your age, overall health, lifestyle risk factors, and family history of disease. Counseling  Your health care provider may ask you questions about your:  Alcohol use.  Tobacco use.  Drug  use.  Emotional well-being.  Home and relationship well-being.  Sexual activity.  Eating habits.  History of falls.  Memory and ability to understand (cognition).  Work and work Statistician. Screening  You may have the following tests or measurements:  Height, weight, and BMI.  Blood pressure.  Lipid and cholesterol levels. These may be checked every 5 years, or more frequently if you are over 57 years old.  Skin check.  Lung cancer screening. You may have this screening every year starting at age 25 if you have a 30-pack-year history of smoking and currently smoke or have quit within the past 15 years.  Fecal occult blood test (FOBT) of the stool. You may have this test every year starting at age 5.  Flexible sigmoidoscopy or colonoscopy. You may have a sigmoidoscopy every 5 years or a colonoscopy every 10 years starting at age 28.  Prostate cancer screening. Recommendations will vary depending on your family history and other risks.  Hepatitis C blood test.  Hepatitis B blood test.  Sexually transmitted disease (STD) testing.  Diabetes screening. This is done by checking your blood sugar (glucose) after you have not eaten for a while (fasting). You may have this done every 1-3 years.  Abdominal aortic aneurysm (AAA) screening. You may need this if you are a current or former smoker.  Osteoporosis. You may be screened starting at age 53 if you are at high risk. Talk with your health care provider about your test results, treatment options, and if necessary, the need for more tests. Vaccines  Your health care provider may recommend certain vaccines, such as:  Influenza vaccine. This is recommended every year.  Tetanus, diphtheria, and acellular pertussis (Tdap, Td) vaccine. You may need a Td booster every 10 years.  Zoster vaccine. You may need this after age 11.  Pneumococcal 13-valent conjugate (PCV13) vaccine. One dose is recommended after age  6.  Pneumococcal polysaccharide (PPSV23) vaccine. One dose is recommended after age 69. Talk to your health care provider about which screenings and vaccines you need and how often you need them. This information is not intended to replace advice given to you by your health care provider. Make sure you discuss any questions you have with your health care provider. Document Released: 01/23/2015 Document Revised: 09/16/2015 Document Reviewed: 10/28/2014 Elsevier Interactive Patient Education  2017 [place] Prevention in the Home Falls can cause injuries. They can happen to people of all ages. There are many things you can do to make your home safe and to help prevent falls. What can I do on the outside of my home?  Regularly fix the edges of walkways and driveways and fix any cracks.  Remove anything that might make you trip as you walk through a door, such as a raised step or threshold.  Trim any bushes or trees on the path to your home.  Use bright outdoor lighting.  Clear any walking paths of anything that might make someone trip, such as rocks or tools.  Regularly check to see if handrails are loose or broken. Make sure that both sides of any steps have handrails.  Any raised decks and porches should have guardrails on the edges.  Have any leaves, snow, or ice cleared regularly.  Use sand or salt on walking paths during winter.  Clean up any spills in your garage right away. This includes oil or grease spills. What can I do in the bathroom?  Use night lights.  Install grab bars by the toilet and in the tub and shower. Do not use towel bars as grab bars.  Use non-skid mats or decals in the tub or shower.  If you need to sit down in the shower, use a plastic, non-slip stool.  Keep the floor dry. Clean up any water that spills on the floor as soon as it happens.  Remove soap buildup in the tub or shower regularly.  Attach bath mats securely with double-sided  non-slip rug tape.  Do not have throw rugs and other things on the floor that can make you trip. What can I do in the bedroom?  Use night lights.  Make sure that you have a light by your bed that is easy to reach.  Do not use any sheets or blankets that are too big for your bed. They should not hang down onto the floor.  Have a firm chair that has side arms. You can use this for support while you get dressed.  Do not have throw rugs and other things on the floor that can make you trip. What can I do in the kitchen?  Clean up any spills right away.  Avoid walking on wet floors.  Keep items that you use a lot in easy-to-reach places.  If you need to reach something above you, use a strong step stool that has a grab bar.  Keep electrical cords out of the way.  Do not use floor polish or wax that makes floors slippery. If you must use wax, use non-skid floor wax.  Do not have throw rugs and other things on the floor that can make you trip. What can I do  with my stairs?  Do not leave any items on the stairs.  Make sure that there are handrails on both sides of the stairs and use them. Fix handrails that are broken or loose. Make sure that handrails are as long as the stairways.  Check any carpeting to make sure that it is firmly attached to the stairs. Fix any carpet that is loose or worn.  Avoid having throw rugs at the top or bottom of the stairs. If you do have throw rugs, attach them to the floor with carpet tape.  Make sure that you have a light switch at the top of the stairs and the bottom of the stairs. If you do not have them, ask someone to add them for you. What else can I do to help prevent falls?  Wear shoes that:  Do not have high heels.  Have rubber bottoms.  Are comfortable and fit you well.  Are closed at the toe. Do not wear sandals.  If you use a stepladder:  Make sure that it is fully opened. Do not climb a closed stepladder.  Make sure that both  sides of the stepladder are locked into place.  Ask someone to hold it for you, if possible.  Clearly mark and make sure that you can see:  Any grab bars or handrails.  First and last steps.  Where the edge of each step is.  Use tools that help you move around (mobility aids) if they are needed. These include:  Canes.  Walkers.  Scooters.  Crutches.  Turn on the lights when you go into a dark area. Replace any light bulbs as soon as they burn out.  Set up your furniture so you have a clear path. Avoid moving your furniture around.  If any of your floors are uneven, fix them.  If there are any pets around you, be aware of where they are.  Review your medicines with your doctor. Some medicines can make you feel dizzy. This can increase your chance of falling. Ask your doctor what other things that you can do to help prevent falls. This information is not intended to replace advice given to you by your health care provider. Make sure you discuss any questions you have with your health care provider. Document Released: 10/23/2008 Document Revised: 06/04/2015 Document Reviewed: 01/31/2014 Elsevier Interactive Patient Education  2017 [employer].

## 2018-12-25 NOTE — Progress Notes (Signed)
Subjective:   [name] is a 85 y.o. male who presents for Medicare Annual/Subsequent preventive examination.  This visit is being conducted via phone call  - after an attmept to do on video chat - due to the COVID-19 pandemic. This patient has given me verbal consent via phone to conduct this visit, patient states they are participating from their home address. Some vital signs may be absent or patient reported.   Patient identification: identified by name, DOB, and current address.     Review of Systems:   Cardiac Risk Factors include: advanced age (>53men, >37 women);male gender;dyslipidemia;hypertension     Objective:    Vitals: There were no vitals taken for this visit.  There is no height or weight on file to calculate BMI.  Advanced Directives 12/25/2018 12/19/2017 10/24/2017 12/06/2016 11/24/2015 05/28/2015 11/28/2014  Does Patient Have a Medical Advance Directive? Yes Yes No No No No No  Type of Advance Directive Living will;Healthcare Power of [place];Living will - - - - -  Copy of [place] in Chart? - No - copy requested - - - - -  Would patient like information on creating a medical advance directive? - - - No - Patient declined Yes - Educational materials given No - patient declined information Yes Higher education careers adviser given  Pre-existing out of facility DNR order (yellow form or pink MOST form) - - - - - - -    Tobacco Social History   Tobacco Use  Smoking Status Former Smoker  . Types: Cigars  . Quit date: 10/04/1988  . Years since quitting: 30.2  Smokeless Tobacco Former Systems developer  . Types: Chew  . Quit date: 09/04/1991     Counseling given: Not Answered   Clinical Intake:                       Past Medical History:  Diagnosis Date  . Arthritis   . Coronary artery disease    a. 1993 s/p PCI/BMS LAD;  b. 2003 PCI RCA;  c. 09/2006 ISR LAD->PCI, PCI RCA;  d. 02/2007 MV: no ischemia;  d. 01/2012  MV: EF 74%, no ischemia.  [name] History of prostate cancer   . Hyperlipidemia   . Hyperthyroidism   . Low testosterone   . PAF (paroxysmal atrial fibrillation) ([place])    a. previously on amio ->stopped in 2013;  b. recurrent PAF 01/2012->Pradaxa and amio initiated, subsequently came off of pradaxa 2/2 cost;  c. 02/2012 Echo: EF 55-60%, Gr 1 DD.  [name] Prostate cancer ([place])   . S/P CABG x 4    Past Surgical History:  Procedure Laterality Date  . APPENDECTOMY     Dr. Bary Castilla  . CARDIAC CATHETERIZATION  2008  . CLIPPING OF ATRIAL APPENDAGE Left 05/18/2012   Procedure: CLIPPING OF ATRIAL APPENDAGE;  Surgeon: [name], MD;  Location: [place];  Service: Open Heart Surgery;  Laterality: Left;  . COLONOSCOPY  2013  . COLONOSCOPY WITH PROPOFOL N/A 07/12/2016   Procedure: COLONOSCOPY WITH PROPOFOL;  Surgeon: [name], MD;  Location[facility] ENDOSCOPY;  Service: Endoscopy;  Laterality: N/A;  . CORONARY ARTERY BYPASS GRAFT N/A 05/18/2012   Procedure: CORONARY ARTERY BYPASS GRAFTING (CABG);  Surgeon: [name], MD;  Location: [place];  Service: Open Heart Surgery;  Laterality: N/A;  Times 4 using left internal mammary artery and endoscopically harvested right saphenous vein  . ESOPHAGOGASTRODUODENOSCOPY (EGD) WITH PROPOFOL N/A 10/24/2017  Procedure: ESOPHAGOGASTRODUODENOSCOPY (EGD) WITH PROPOFOL;  Surgeon: [name], MD;  Location: [facility] ENDOSCOPY;  Service: Endoscopy;  Laterality: N/A;  . HERNIA REPAIR     s/p mesh bilaterally, Dr. Bary Castilla  . INTRAOPERATIVE TRANSESOPHAGEAL ECHOCARDIOGRAM N/A 05/18/2012   Procedure: INTRAOPERATIVE TRANSESOPHAGEAL ECHOCARDIOGRAM;  Surgeon: [name], MD;  Location: [place];  Service: Open Heart Surgery;  Laterality: N/A;  . IR ESOPHAGUS DILITATION RETRO FLUORO    . LEFT HEART CATHETERIZATION WITH CORONARY ANGIOGRAM Bilateral 05/16/2012   Procedure: LEFT HEART CATHETERIZATION WITH CORONARY ANGIOGRAM;  Surgeon: Burnell Blanks, MD;  Location: [facility] CATH LAB;  Service:  Cardiovascular;  Laterality: Bilateral;  . MAZE N/A 05/18/2012   Procedure: MAZE;  Surgeon: [name], MD;  Location: [place];  Service: Open Heart Surgery;  Laterality: N/A;  . MOHS SURGERY    . PROSTATECTOMY    . TOTAL HIP ARTHROPLASTY Right    Dr. [name]   Family History  Problem Relation Age of Onset  . Coronary artery disease Mother   . Heart disease Mother   . Coronary artery disease Sister   . Diabetes Sister   . Heart disease Sister 82       deceased  . Diabetes Father   . Heart disease Brother    Social History   Socioeconomic History  . Marital status: Married    Spouse name: [name] Hucks  . Number of children: 1  . Years of education: JD  . Highest education level: Bachelor's degree (e.g., BA, AB, BS)  Occupational History  . 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 Occupation: Former Geographical information systems officer  Tobacco Use  . Smoking status: Former Smoker    Types: Cigars    Quit date: 10/04/1988    Years since quitting: 30.2  . Smokeless tobacco: Former Systems developer    Types: [place] date: 09/04/1991  Substance and Sexual Activity  . Alcohol use: Yes    Alcohol/week: 7.0 standard drinks    Types: 7 Glasses of wine per week    Comment: 1/3 cup a day of red wine   . Drug use: No  . Sexual activity: Not Currently  Other Topics Concern  . Not on file  Social History Narrative   Lives in [place] with wife. He was born in [place] and went to [place] in [place]. Daughter in [place], 2 step-daughters. Retired from [employer].   Social Determinants of Health   Financial Resource Strain:   . Difficulty of Paying Living Expenses: Not on file  Food Insecurity:   . Worried About Charity fundraiser in the Last Year: Not on file  . Ran Out of Food in the Last Year: Not on file  Transportation Needs:   . Lack of Transportation (Medical): Not on file  . Lack of Transportation (Non-Medical): Not on file  Physical Activity:    . Days of Exercise per Week: Not on file  . Minutes of Exercise per Session: Not on file  Stress:   . Feeling of Stress : Not on file  Social Connections:   . Frequency of Communication with Friends and Family: Not on file  . Frequency of Social Gatherings with Friends and Family: Not on file  . Attends Religious Services: Not on file  . Active Member of Clubs or Organizations: Not on file  . Attends Archivist Meetings: Not on file  . Marital Status: Not on file  Outpatient Encounter Medications as of 12/25/2018  Medication Sig  . Acetaminophen (TYLENOL ARTHRITIS PAIN PO) Take by mouth as needed.  [name] aspirin EC 81 MG tablet Take 81 mg by mouth every evening.  . baclofen (LIORESAL) 10 MG tablet Take 0.5-1 tablets (5-10 mg total) by mouth 3 (three) times daily as needed for muscle spasms.  . bisacodyl (DULCOLAX) 10 MG suppository Place 10 mg rectally daily as needed for moderate constipation.  . Cholecalciferol (VITAMIN D3) 1.25 MG (50000 UT) CAPS Take by mouth daily.  [name] ezetimibe (ZETIA) 10 MG tablet Take 1 tablet (10 mg total) by mouth daily.  . pantoprazole (PROTONIX) 40 MG tablet Take 1 tablet (40 mg total) by mouth daily before breakfast.  . polyethylene glycol (MIRALAX / GLYCOLAX) packet Take 17 g by mouth daily as needed.  . simvastatin (ZOCOR) 20 MG tablet Take 1 tablet (20 mg total) by mouth at bedtime.  [name] testosterone cypionate (DEPOTESTOSTERONE CYPIONATE) 200 MG/ML injection Inject 0.3 mLs (60 mg total) into the muscle every 14 (fourteen) days.  [name] timolol (TIMOPTIC) 0.5 % ophthalmic solution Place 1 drop into both eyes daily.   . hydrocortisone 2.5 % cream   . [DISCONTINUED] latanoprost (XALATAN) 0.005 % ophthalmic solution    No facility-administered encounter medications on file as of 12/25/2018.    Activities of Daily Living In your present state of health, do you have any difficulty performing the following activities: 12/25/2018  Hearing? N  Comment no  hearing aids  Vision? N  Comment eyeglasses, goes to [facility]  Difficulty concentrating or making decisions? N  Walking or climbing stairs? N  Dressing or bathing? N  Doing errands, shopping? N  Preparing Food and eating ? N  Using the Toilet? N  In the past six months, have you accidently leaked urine? N  Do you have problems with loss of bowel control? N  Managing your Medications? N  Managing your Finances? N  Housekeeping or managing your Housekeeping? N  Some recent data might be hidden    Patient Care Team: [name], DO as PCP - General (Family Medicine) Minna Merritts, MD as Consulting Physician (Cardiology) Estill Cotta, MD (Ophthalmology)   Assessment:   This is a routine wellness examination for [name].  Exercise Activities and Dietary recommendations Current Exercise Habits: The patient does not participate in regular exercise at present, Exercise limited by: None identified  Goals    . DIET - INCREASE WATER INTAKE     Recommend drinking at least 6-8 glasses of water a day    . Increase water intake     STAY HYDRATED AND DRINK PLENTY OF WATER.       Fall Risk: Fall Risk  12/25/2018 11/16/2018 12/19/2017 06/07/2017 12/06/2016  Falls in the past year? 0 0 0 No No  Number falls in past yr: 0 0 - - -  Injury with Fall? 0 - - - -  Follow up - Falls evaluation completed - - -    FALL RISK PREVENTION PERTAINING TO THE HOME:  Any stairs in or around the home? Yes  If so, are there any without handrails? No   Home free of loose throw rugs in walkways, pet beds, electrical cords, etc? Yes  Adequate lighting in your home to reduce risk of falls? Yes   ASSISTIVE DEVICES UTILIZED TO PREVENT FALLS:  Life alert? No  Use of a cane, walker or w/c? Yes  cane as needed  Grab bars in the bathroom?  No  Shower chair or bench in shower? Yes  has if needed  Elevated toilet seat or a handicapped toilet? No   TIMED UP AND GO:  Unable  to perform   Depression Screen PHQ 2/9 Scores 12/25/2018 11/16/2018 12/19/2017 06/07/2017  PHQ - 2 Score 0 0 0 1    Cognitive Function MMSE - Mini Mental State Exam 06/07/2017 11/24/2015 11/28/2014  Orientation to time 5 5 5   Orientation to Place 5 5 5   Registration 3 3 3   Attention/ Calculation 5 5 5   Recall 3 3 3   Language- name 2 objects 2 2 2   Language- repeat 1 1 1   Language- follow 3 step command 3 3 3   Language- read & follow direction 1 1 1   Write a sentence 1 1 1   Copy design 1 1 1   Total score 30 30 30      6CIT Screen 12/06/2016  What Year? 0 points  What month? 0 points  What time? 0 points  Count back from 20 0 points  Months in reverse 0 points  Repeat phrase 0 points  Total Score 0    Immunization History  Administered Date(s) Administered  . Influenza Split 10/05/2010, 10/27/2011, 10/10/2013  . Influenza, High Dose Seasonal PF 09/23/2015, 09/19/2016, 09/17/2017, 08/27/2018  . Influenza,inj,Quad PF,6+ Mos 09/18/2012, 11/04/2014  . Pneumococcal Conjugate-13 02/19/2013  . Pneumococcal Polysaccharide-23 11/15/2007    Qualifies for Shingles Vaccine? Yes  Zostavax completed n/a. Due for Shingrix. Education has been provided regarding the importance of this vaccine. Pt has been advised to call insurance company to determine out of pocket expense. Advised may also receive vaccine at local pharmacy or Health Dept. Verbalized acceptance and understanding.  Tdap: Discussed need for TD/TDAP vaccine, patient verbalized understanding that this is not covered as a preventative with there insurance and to call the office if he develops any new skin injuries, ie: cuts, scrapes, bug bites, or open wounds..  Flu Vaccine: up to date   Pneumococcal Vaccine: up to date   Screening Tests Health Maintenance  Topic Date Due  . TETANUS/TDAP  12/25/2019 (Originally 12/01/1951)  . INFLUENZA VACCINE  Completed  . PNA vac Low Risk Adult  Completed   Cancer Screenings:   Colorectal Screening: no longer required   Lung Cancer Screening: (Low Dose CT Chest recommended if Age 46-80 years, 30 pack-year currently smoking OR have quit w/in 15years.) does not qualify.     Additional Screening:  Hepatitis C Screening: does not qualify  Vision Screening: Recommended annual ophthalmology exams for early detection of glaucoma and other disorders of the eye. Is the patient up to date with their annual eye exam?  Yes  Who is the provider or what is the name of the office in which the pt attends annual eye exams? [facility]    Dental Screening: Recommended annual dental exams for proper oral hygiene  Community Resource Referral:  CRR required this visit?  No        Plan:  I have personally reviewed and addressed the Medicare Annual Wellness questionnaire and have noted the following in the patient's chart:  A. Medical and social history B. Use of alcohol, tobacco or illicit drugs  C. Current medications and supplements D. Functional ability and status E.  Nutritional status F.  Physical activity G. Advance directives H. List of other physicians I.  Hospitalizations, surgeries, and ER visits in previous 12 months J.  [place] such as hearing and vision if needed, cognitive and  depression L. Referrals and appointments   In addition, I have reviewed and discussed with patient certain preventive protocols, quality metrics, and best practice recommendations. A written personalized care plan for preventive services as well as general preventive health recommendations were provided to patient.   Signed,   Bevelyn Ngo, LPN  [id number] Nurse Health Advisor   Nurse Notes: requesting Flonase nasal spray

## 2018-12-26 ENCOUNTER — Other Ambulatory Visit: Payer: Self-pay | Admitting: Family Medicine

## 2018-12-26 DIAGNOSIS — J3089 Other allergic rhinitis: Secondary | ICD-10-CM

## 2018-12-26 MED ORDER — FLUTICASONE PROPIONATE 50 MCG/ACT NA SUSP: 2.0000 | Freq: Every day | NASAL | 3 refills | Status: DC

## 2018-12-30 ENCOUNTER — Emergency Department
Admission: EM | Admit: 2018-12-30 | Discharge: 2018-12-30 | Disposition: A | Discharge status: Home / Self Care | Payer: PPO | Attending: Emergency Medicine | Admitting: Emergency Medicine

## 2018-12-30 ENCOUNTER — Emergency Department: Payer: PPO

## 2018-12-30 ENCOUNTER — Other Ambulatory Visit: Payer: Self-pay

## 2018-12-30 DIAGNOSIS — Z87891 Personal history of nicotine dependence: Secondary | ICD-10-CM | POA: Diagnosis not present

## 2018-12-30 DIAGNOSIS — S79912A Unspecified injury of left hip, initial encounter: Secondary | ICD-10-CM | POA: Diagnosis not present

## 2018-12-30 DIAGNOSIS — I10 Essential (primary) hypertension: Secondary | ICD-10-CM | POA: Insufficient documentation

## 2018-12-30 DIAGNOSIS — R52 Pain, unspecified: Secondary | ICD-10-CM | POA: Diagnosis not present

## 2018-12-30 DIAGNOSIS — M25552 Pain in left hip: Secondary | ICD-10-CM | POA: Diagnosis not present

## 2018-12-30 DIAGNOSIS — R2681 Unsteadiness on feet: Secondary | ICD-10-CM | POA: Diagnosis not present

## 2018-12-30 DIAGNOSIS — Z951 Presence of aortocoronary bypass graft: Secondary | ICD-10-CM | POA: Diagnosis not present

## 2018-12-30 DIAGNOSIS — Z7982 Long term (current) use of aspirin: Secondary | ICD-10-CM | POA: Diagnosis not present

## 2018-12-30 DIAGNOSIS — Z79899 Other long term (current) drug therapy: Secondary | ICD-10-CM | POA: Insufficient documentation

## 2018-12-30 DIAGNOSIS — W19XXXA Unspecified fall, initial encounter: Secondary | ICD-10-CM | POA: Insufficient documentation

## 2018-12-30 DIAGNOSIS — Z96641 Presence of right artificial hip joint: Secondary | ICD-10-CM | POA: Insufficient documentation

## 2018-12-30 DIAGNOSIS — D122 Benign neoplasm of ascending colon: Secondary | ICD-10-CM | POA: Insufficient documentation

## 2018-12-30 DIAGNOSIS — I2510 Atherosclerotic heart disease of native coronary artery without angina pectoris: Secondary | ICD-10-CM | POA: Diagnosis not present

## 2018-12-30 DIAGNOSIS — R296 Repeated falls: Secondary | ICD-10-CM | POA: Diagnosis not present

## 2018-12-30 DIAGNOSIS — Z8546 Personal history of malignant neoplasm of prostate: Secondary | ICD-10-CM | POA: Diagnosis not present

## 2018-12-30 DIAGNOSIS — S0990XA Unspecified injury of head, initial encounter: Secondary | ICD-10-CM | POA: Diagnosis not present

## 2018-12-30 LAB — URINALYSIS, COMPLETE (UACMP) WITH MICROSCOPIC
Bacteria, UA: NONE SEEN
Bilirubin Urine: NEGATIVE
Glucose, UA: NEGATIVE mg/dL
Hgb urine dipstick: NEGATIVE
Ketones, ur: NEGATIVE mg/dL
Leukocytes,Ua: NEGATIVE
Nitrite: NEGATIVE
Protein, ur: NEGATIVE mg/dL
Specific Gravity, Urine: 1.011 (ref 1.005–1.030)
Squamous Epithelial / HPF: NONE SEEN (ref 0–5)
WBC, UA: NONE SEEN WBC/hpf (ref 0–5)
pH: 7 (ref 5.0–8.0)

## 2018-12-30 MED ORDER — LIDOCAINE 5 % EX PTCH: 1.0000 | MEDICATED_PATCH | CUTANEOUS | 0 refills | Status: DC

## 2018-12-30 MED ORDER — LIDOCAINE 5 % EX PTCH
1.0000 | MEDICATED_PATCH | CUTANEOUS | Status: DC
  Administered 2018-12-30: 15:00:00 1 via TRANSDERMAL
  Filled 2018-12-30: qty 1

## 2018-12-30 NOTE — Discharge Instructions (Signed)
There is no fracture in your hip.  Your CT scan does show some arthritis.  You can begin using Lidoderm patches.  Begin heat therapy.  Continue using walker.  Please call orthopedics tomorrow morning for a follow-up appointment.  Also call primary care for a follow-up appointment.

## 2018-12-30 NOTE — ED Notes (Signed)
Pt ambulatory to the bathroom with assistance of family.

## 2018-12-30 NOTE — ED Notes (Signed)
Pt able to stand and pivot to the bed with one minimal assistance. Pt reports feeling unsteady but denies sharp pain while standing.

## 2018-12-30 NOTE — ED Notes (Signed)
Pt given walker and verbalized discharge instructions and has no questions at this time.

## 2018-12-30 NOTE — ED Provider Notes (Signed)
[facility] Emergency Department Provider Note  ____________________________________________  Time seen: Approximately 3:06 PM  I have reviewed the triage vital signs and the nursing notes.   HISTORY  Chief Complaint Hip Pain    HPI [name] is a 85 y.o. male that presents to emergency department for evaluation of left hip pain after a fall today.  Patient states that his hip gave out on him twice today.  He felt a spasm in his hip and his hip "gave out."  Pain is localized to the side of his left hip.  It does not radiate.  Pain is sharp in character.  Pain is worse with movement.  He has had previous issues with his hip before.  He had a fall in July, injuring his left hip.  One PA told him that he had a hairline fracture and another MD said that he had a bone contusion.  He went through physical therapy and things seem to improve.  He had a right hip replacement with Dr. Harlow Mares.  Patient takes a baby aspirin daily.  He did not hit his head or lose consciousness.  No headache, neck pain, shortness of breath, chest pain, abdominal pain.   Past Medical History:  Diagnosis Date  . Arthritis   . Coronary artery disease    a. 1993 s/p PCI/BMS LAD;  b. 2003 PCI RCA;  c. 09/2006 ISR LAD->PCI, PCI RCA;  d. 02/2007 MV: no ischemia;  d. 01/2012 MV: EF 74%, no ischemia.  [name] History of prostate cancer   . Hyperlipidemia   . Hyperthyroidism   . Low testosterone   . PAF (paroxysmal atrial fibrillation) ([place])    a. previously on amio ->stopped in 2013;  b. recurrent PAF 01/2012->Pradaxa and amio initiated, subsequently came off of pradaxa 2/2 cost;  c. 02/2012 Echo: EF 55-60%, Gr 1 DD.  [name] Prostate cancer ([place])   . S/P CABG x 4     Patient Active Problem List   Diagnosis Date Noted  . Chronic right-sided low back pain without sciatica 11/16/2018  . Gastroesophageal reflux disease without esophagitis 12/19/2017  . Esophageal dysphagia   . Stricture and stenosis of  esophagus   . Forgetfulness 06/07/2017  . Primary osteoarthritis involving multiple joints 09/06/2016  . Benign neoplasm of ascending colon   . AAA (abdominal aortic aneurysm) without rupture ([place]) 04/17/2015  . Constipation 09/23/2014  . Hardening of the aorta (main artery of the heart) ([place]) 04/02/2014  . Chronic back pain 04/02/2014  . ED (erectile dysfunction) of organic origin 02/19/2013  . Pre-diabetes 02/19/2013  . History of coronary artery bypass surgery 05/23/2012  . S/P Maze operation for atrial fibrillation 05/23/2012  . Postprocedural state 05/23/2012  . Personal history of prostate cancer 09/06/2011  . Male urinary stress incontinence 09/06/2011  . Testicular hypofunction 06/22/2011  . Insomnia 10/05/2010  . Hyperlipidemia 06/17/2009  . Benign essential hypertension 06/17/2009  . Coronary arteriosclerosis in native artery 06/17/2009  . History of atrial fibrillation 06/17/2009    Past Surgical History:  Procedure Laterality Date  . APPENDECTOMY     Dr. Bary Castilla  . CARDIAC CATHETERIZATION  2008  . CLIPPING OF ATRIAL APPENDAGE Left 05/18/2012   Procedure: CLIPPING OF ATRIAL APPENDAGE;  Surgeon: [name], MD;  Location: [place];  Service: Open Heart Surgery;  Laterality: Left;  . COLONOSCOPY  2013  . COLONOSCOPY WITH PROPOFOL N/A 07/12/2016   Procedure: COLONOSCOPY WITH PROPOFOL;  Surgeon: [name], MD;  Location: [facility] ENDOSCOPY;  Service: Endoscopy;  Laterality: N/A;  . CORONARY ARTERY BYPASS GRAFT N/A 05/18/2012   Procedure: CORONARY ARTERY BYPASS GRAFTING (CABG);  Surgeon: [name], MD;  Location: [place];  Service: Open Heart Surgery;  Laterality: N/A;  Times 4 using left internal mammary artery and endoscopically harvested right saphenous vein  . ESOPHAGOGASTRODUODENOSCOPY (EGD) WITH PROPOFOL N/A 10/24/2017   Procedure: ESOPHAGOGASTRODUODENOSCOPY (EGD) WITH PROPOFOL;  Surgeon: [name], MD;  Location: [facility] ENDOSCOPY;  Service: Endoscopy;  Laterality:  N/A;  . HERNIA REPAIR     s/p mesh bilaterally, Dr. Bary Castilla  . INTRAOPERATIVE TRANSESOPHAGEAL ECHOCARDIOGRAM N/A 05/18/2012   Procedure: INTRAOPERATIVE TRANSESOPHAGEAL ECHOCARDIOGRAM;  Surgeon: [name], MD;  Location: [place];  Service: Open Heart Surgery;  Laterality: N/A;  . IR ESOPHAGUS DILITATION RETRO FLUORO    . LEFT HEART CATHETERIZATION WITH CORONARY ANGIOGRAM Bilateral 05/16/2012   Procedure: LEFT HEART CATHETERIZATION WITH CORONARY ANGIOGRAM;  Surgeon: Burnell Blanks, MD;  Location: [facility] CATH LAB;  Service: Cardiovascular;  Laterality: Bilateral;  . MAZE N/A 05/18/2012   Procedure: MAZE;  Surgeon: [name], MD;  Location: [place];  Service: Open Heart Surgery;  Laterality: N/A;  . MOHS SURGERY    . PROSTATECTOMY    . TOTAL HIP ARTHROPLASTY Right    Dr. [name]    Prior to Admission medications   Medication Sig Start Date End Date Taking? Authorizing Provider  Acetaminophen (TYLENOL ARTHRITIS PAIN PO) Take by mouth as needed.    [provider]  aspirin EC 81 MG tablet Take 81 mg by mouth every evening.    [provider]  baclofen (LIORESAL) 10 MG tablet Take 0.5-1 tablets (5-10 mg total) by mouth 3 (three) times daily as needed for muscle spasms. 11/16/18   Karamalegos, Devonne Doughty, DO  bisacodyl (DULCOLAX) 10 MG suppository Place 10 mg rectally daily as needed for moderate constipation.    [provider]  Cholecalciferol (VITAMIN D3) 1.25 MG (50000 UT) CAPS Take by mouth daily.    [provider]  ezetimibe (ZETIA) 10 MG tablet Take 1 tablet (10 mg total) by mouth daily. 12/19/18   Minna Merritts, MD  fluticasone (FLONASE) 50 MCG/ACT nasal spray Place 2 sprays into both nostrils daily. Use for 4-6 weeks then stop and use seasonally or as needed. 12/26/18   Karamalegos, Devonne Doughty, DO  hydrocortisone 2.5 % cream  05/03/18   [provider]  lidocaine (LIDODERM) 5 % Place 1 patch onto the skin daily. Remove & Discard patch  within 12 hours or as directed by MD 12/30/18   Laban Emperor, PA-C  pantoprazole (PROTONIX) 40 MG tablet Take 1 tablet (40 mg total) by mouth daily before breakfast. 11/16/18   Karamalegos, Devonne Doughty, DO  polyethylene glycol (MIRALAX / GLYCOLAX) packet Take 17 g by mouth daily as needed.    [provider]  simvastatin (ZOCOR) 20 MG tablet Take 1 tablet (20 mg total) by mouth at bedtime. 12/19/18   Minna Merritts, MD  testosterone cypionate (DEPOTESTOSTERONE CYPIONATE) 200 MG/ML injection Inject 0.3 mLs (60 mg total) into the muscle every 14 (fourteen) days. 09/20/18   Stoioff, Ronda Fairly, MD  timolol (TIMOPTIC) 0.5 % ophthalmic solution Place 1 drop into both eyes daily.  05/04/17   [provider]    Allergies Ambien [zolpidem tartrate], Pravastatin sodium, Rosuvastatin calcium, Statins, and Zolpidem tartrate  Family History  Problem Relation Age of Onset  . Coronary artery disease Mother   . Heart disease Mother   .  Coronary artery disease Sister   . Diabetes Sister   . Heart disease Sister 34       deceased  . Diabetes Father   . Heart disease Brother     Social History Social History   Tobacco Use  . Smoking status: Former Smoker    Types: Cigars    Quit date: 10/04/1988    Years since quitting: 30.2  . Smokeless tobacco: Former Systems developer    Types: Chew    Quit date: 09/04/1991  Substance Use Topics  . Alcohol use: Yes    Alcohol/week: 7.0 standard drinks    Types: 7 Glasses of wine per week    Comment: 1/3 cup a day of red wine   . Drug use: No     Review of Systems  Constitutional: No fever/chills Cardiovascular: No chest pain. Respiratory: No SOB. Gastrointestinal: No abdominal pain.  No nausea, no vomiting.  Genitourinary: Negative for dysuria. Musculoskeletal: Positive for hip pain. Skin: Negative for rash, abrasions, lacerations, ecchymosis. Neurological: Negative for headaches, numbness or  tingling   ____________________________________________   PHYSICAL EXAM:  VITAL SIGNS: ED Triage Vitals  Enc Vitals Group     BP 12/30/18 1120 (!) 156/87     Pulse Rate 12/30/18 1120 72     Resp 12/30/18 1120 18     Temp 12/30/18 1120 98.5 F (36.9 C)     Temp Source 12/30/18 1120 Oral     SpO2 12/30/18 1120 100 %     Weight --      Height --      Head Circumference --      Peak Flow --      Pain Score 12/30/18 1121 0     Pain Loc --      Pain Edu? --      Excl. in [place]? --      Constitutional: Alert and oriented. Well appearing and in no acute distress. Eyes: Conjunctivae are normal. PERRL. EOMI. Head: Atraumatic. ENT:      Ears:      Nose: No congestion/rhinnorhea.      Mouth/Throat: Mucous membranes are moist.  Neck: No stridor.   Cardiovascular: Normal rate, regular rhythm.  Good peripheral circulation. Respiratory: Normal respiratory effort without tachypnea or retractions. Lungs CTAB. Good air entry to the bases with no decreased or absent breath sounds. Gastrointestinal: Bowel sounds 4 quadrants. Soft and nontender to palpation. No guarding or rigidity. No palpable masses. No distention.  Musculoskeletal: Full range of motion to all extremities. No gross deformities appreciated.  Tenderness to palpation to left lateral hip.  Full range of motion of left hip.  Weightbearing without assistance.  Ambulatory with a walker. Neurologic:  Normal speech and language. No gross focal neurologic deficits are appreciated.  Skin:  Skin is warm, dry and intact. No rash noted. Psychiatric: Mood and affect are normal. Speech and behavior are normal. Patient exhibits appropriate insight and judgement.   ____________________________________________   LABS (all labs ordered are listed, but only abnormal results are displayed)  Labs Reviewed  URINALYSIS, COMPLETE (UACMP) WITH MICROSCOPIC - Abnormal; Notable for the following components:      Result Value   Color, Urine YELLOW  (*)    APPearance CLEAR (*)    All other components within normal limits   ____________________________________________  EKG   ____________________________________________  RADIOLOGY Robinette Haines, personally viewed and evaluated these images (plain radiographs) as part of my medical decision making, as well as reviewing the written report by  the radiologist.  CT Head Wo Contrast  Result Date: 12/30/2018 CLINICAL DATA:  85 year old male status post fall EXAM: CT HEAD WITHOUT CONTRAST TECHNIQUE: Contiguous axial images were obtained from the base of the skull through the vertex without intravenous contrast. COMPARISON:  Remote prior head CT 02/18/2005 FINDINGS: Brain: No evidence of acute infarction, hemorrhage, hydrocephalus, extra-axial collection or mass lesion/mass effect. Central and cortical atrophy with resultant ex vacuo dilation of the lateral ventricles. Vascular: No hyperdense vessel or unexpected calcification. Skull: Normal. Negative for fracture or focal lesion. Sinuses/Orbits: No acute finding. Other: None. IMPRESSION: 1. No acute intracranial abnormality. 2. Cortical and central atrophy. Electronically Signed   By: Jacqulynn Cadet M.D.   On: 12/30/2018 14:02   CT Hip Left Wo Contrast  Result Date: 12/30/2018 CLINICAL DATA:  Increasing left hip spasms and falling over the last couple days. EXAM: CT OF THE LEFT HIP WITHOUT CONTRAST TECHNIQUE: Multidetector CT imaging of the left hip was performed according to the standard protocol. Multiplanar CT image reconstructions were also generated. COMPARISON:  Radiographs same date. Pelvic CT 04/15/2015. FINDINGS: Bones/Joint/Cartilage The bones are adequately mineralized. There is no evidence of acute fracture or dislocation. There is no evidence of femoral head avascular necrosis. There are mild left hip degenerative changes with associated labral chondrocalcinosis. No significant hip joint effusion. Ligaments Suboptimally assessed  by CT. Muscles and Tendons The visualized left hip and proximal thigh muscles and tendons appear normal. Soft tissues No periarticular hematoma or foreign body. Mild left iliofemoral atherosclerosis. The visualized internal pelvic contents appear unremarkable. IMPRESSION: 1. No acute findings or explanation for the patient's symptoms. 2. Mild left hip degenerative changes with associated labral chondrocalcinosis. Electronically Signed   By: Richardean Sale M.D.   On: 12/30/2018 14:06   DG Hip Unilat W or Wo Pelvis 2-3 Views Left  Result Date: 12/30/2018 CLINICAL DATA:  Spasming of left hip. Two falls. EXAM: DG HIP (WITH OR WITHOUT PELVIS) 2-3V LEFT COMPARISON:  None. FINDINGS: The patient is status post right hip replacement. The visualized hardware is in good position although the femoral component is not completely visualized. Degenerative changes are seen in the lower lumbar spine. Mild degenerative changes in the left hip. No fractures in the left hip. IMPRESSION: No left hip fracture.  Mild degenerative changes in the left hip. Electronically Signed   By: Dorise Bullion III M.D   On: 12/30/2018 13:08    ____________________________________________    PROCEDURES  Procedure(s) performed:    Procedures    Medications  lidocaine (LIDODERM) 5 % 1 patch (1 patch Transdermal Patch Applied 12/30/18 1527)     ____________________________________________   INITIAL IMPRESSION / ASSESSMENT AND PLAN / ED COURSE  Pertinent labs & imaging results that were available during my care of the patient were reviewed by me and considered in my medical decision making (see chart for details).  Review of the [place] CSRS was performed in accordance of the [place] prior to dispensing any controlled drugs.   Patient presented to emergency department for evaluation of left hip pain today.  Vital signs and exam are reassuring. Urinalysis negative for infection.  Hip x-ray consistent with degenerative changes.   CT was ordered to further evaluate since patient was unsure whether he had a hairline fracture a couple of months ago.  CT consistent with degenerative type changes without acute fracture.  CT head negative for acute abnormalities.  Patient is ambulating with a walker and without additional assistance.  Patient will follow up with  Dr. Harlow Mares.  He would like to begin physical therapy again.  Social work consult was placed, as patient is unsure whether he would like to discuss options for additional help at home.  Patient will be discharged home with prescriptions for Lidoderm. He will begin heat therapy. Patient is to follow up with primary care and ortho as directed. Patient is given ED precautions to return to the ED for any worsening or new symptoms.  Casen Housman Karas was evaluated in Emergency Department on 12/30/2018 for the symptoms described in the history of present illness. He was evaluated in the context of the global COVID-19 pandemic, which necessitated consideration that the patient might be at risk for infection with the SARS-CoV-2 virus that causes COVID-19. Institutional protocols and algorithms that pertain to the evaluation of patients at risk for COVID-19 are in a state of rapid change based on information released by regulatory bodies including the CDC and federal and state organizations. These policies and algorithms were followed during the patient's care in the ED.   ____________________________________________  FINAL CLINICAL IMPRESSION(S) / ED DIAGNOSES  Final diagnoses:  Left hip pain      NEW MEDICATIONS STARTED DURING THIS VISIT:  ED Discharge Orders         Ordered    lidocaine (LIDODERM) 5 %  Every 24 hours     12/30/18 1517              This chart was dictated using voice recognition software/Dragon. Despite best efforts to proofread, errors can occur which can change the meaning. Any change was purely unintentional.    Laban Emperor, PA-C 12/30/18  1556    Vanessa [place], MD 01/01/19 [phone]

## 2018-12-30 NOTE — ED Notes (Signed)
RN called care manager for ED to discuss discharge plan of care.

## 2018-12-30 NOTE — Social Work (Signed)
Treatment of Care social worker call patient regarding [place] needs/DME needs.  No answer.  TOC SW will continue to reach out to pt.   Berenice Bouton, MSW, LCSW  [phone] 8am-6pm (weekends) or CSW ED # [phone]

## 2018-12-30 NOTE — ED Notes (Signed)
Care manager updated that family is requesting a call to discuss options for home health assistance but not complete care. Number for care manager to call provided in order.

## 2018-12-30 NOTE — ED Triage Notes (Signed)
Pt reports for the past couple days he has been having increasing difficulty with his left hip, pt states that it is spasming and has caused him to fall twice

## 2018-12-30 NOTE — ED Triage Notes (Signed)
Pt in via EMS from home with c/o left hip pain that has caused him to fall several times. Pt has hx of fx as well. BP 207/98, 97% RA, HR 65

## 2019-01-01 DIAGNOSIS — I10 Essential (primary) hypertension: Secondary | ICD-10-CM | POA: Diagnosis not present

## 2019-01-01 DIAGNOSIS — E785 Hyperlipidemia, unspecified: Secondary | ICD-10-CM | POA: Diagnosis not present

## 2019-01-01 DIAGNOSIS — K219 Gastro-esophageal reflux disease without esophagitis: Secondary | ICD-10-CM | POA: Diagnosis not present

## 2019-01-01 DIAGNOSIS — I4891 Unspecified atrial fibrillation: Secondary | ICD-10-CM | POA: Diagnosis not present

## 2019-01-01 DIAGNOSIS — E0501 Thyrotoxicosis with diffuse goiter with thyrotoxic crisis or storm: Secondary | ICD-10-CM | POA: Diagnosis not present

## 2019-01-01 DIAGNOSIS — E063 Autoimmune thyroiditis: Secondary | ICD-10-CM | POA: Diagnosis not present

## 2019-01-01 DIAGNOSIS — E291 Testicular hypofunction: Secondary | ICD-10-CM | POA: Diagnosis not present

## 2019-01-01 DIAGNOSIS — I517 Cardiomegaly: Secondary | ICD-10-CM | POA: Diagnosis not present

## 2019-01-02 DIAGNOSIS — I10 Essential (primary) hypertension: Secondary | ICD-10-CM | POA: Diagnosis not present

## 2019-01-02 DIAGNOSIS — I517 Cardiomegaly: Secondary | ICD-10-CM | POA: Diagnosis not present

## 2019-01-02 DIAGNOSIS — K219 Gastro-esophageal reflux disease without esophagitis: Secondary | ICD-10-CM | POA: Diagnosis not present

## 2019-01-02 DIAGNOSIS — E063 Autoimmune thyroiditis: Secondary | ICD-10-CM | POA: Diagnosis not present

## 2019-01-02 DIAGNOSIS — E0501 Thyrotoxicosis with diffuse goiter with thyrotoxic crisis or storm: Secondary | ICD-10-CM | POA: Diagnosis not present

## 2019-01-02 DIAGNOSIS — E785 Hyperlipidemia, unspecified: Secondary | ICD-10-CM | POA: Diagnosis not present

## 2019-01-02 DIAGNOSIS — I4891 Unspecified atrial fibrillation: Secondary | ICD-10-CM | POA: Diagnosis not present

## 2019-01-03 DIAGNOSIS — M25552 Pain in left hip: Secondary | ICD-10-CM | POA: Diagnosis not present

## 2019-01-16 DIAGNOSIS — M25552 Pain in left hip: Secondary | ICD-10-CM | POA: Diagnosis not present

## 2019-01-16 DIAGNOSIS — R269 Unspecified abnormalities of gait and mobility: Secondary | ICD-10-CM | POA: Diagnosis not present

## 2019-01-17 DIAGNOSIS — E063 Autoimmune thyroiditis: Secondary | ICD-10-CM | POA: Diagnosis not present

## 2019-01-17 DIAGNOSIS — E0501 Thyrotoxicosis with diffuse goiter with thyrotoxic crisis or storm: Secondary | ICD-10-CM | POA: Diagnosis not present

## 2019-01-17 DIAGNOSIS — K219 Gastro-esophageal reflux disease without esophagitis: Secondary | ICD-10-CM | POA: Diagnosis not present

## 2019-01-17 DIAGNOSIS — E785 Hyperlipidemia, unspecified: Secondary | ICD-10-CM | POA: Diagnosis not present

## 2019-01-17 DIAGNOSIS — I517 Cardiomegaly: Secondary | ICD-10-CM | POA: Diagnosis not present

## 2019-01-17 DIAGNOSIS — E291 Testicular hypofunction: Secondary | ICD-10-CM | POA: Diagnosis not present

## 2019-01-17 DIAGNOSIS — I10 Essential (primary) hypertension: Secondary | ICD-10-CM | POA: Diagnosis not present

## 2019-01-17 DIAGNOSIS — I4891 Unspecified atrial fibrillation: Secondary | ICD-10-CM | POA: Diagnosis not present

## 2019-01-18 DIAGNOSIS — R269 Unspecified abnormalities of gait and mobility: Secondary | ICD-10-CM | POA: Diagnosis not present

## 2019-01-18 DIAGNOSIS — M25552 Pain in left hip: Secondary | ICD-10-CM | POA: Diagnosis not present

## 2019-01-21 DIAGNOSIS — H401121 Primary open-angle glaucoma, left eye, mild stage: Secondary | ICD-10-CM | POA: Diagnosis not present

## 2019-01-22 DIAGNOSIS — M25552 Pain in left hip: Secondary | ICD-10-CM | POA: Diagnosis not present

## 2019-01-22 DIAGNOSIS — R269 Unspecified abnormalities of gait and mobility: Secondary | ICD-10-CM | POA: Diagnosis not present

## 2019-01-24 DIAGNOSIS — R269 Unspecified abnormalities of gait and mobility: Secondary | ICD-10-CM | POA: Diagnosis not present

## 2019-01-24 DIAGNOSIS — M25552 Pain in left hip: Secondary | ICD-10-CM | POA: Diagnosis not present

## 2019-01-29 DIAGNOSIS — M25552 Pain in left hip: Secondary | ICD-10-CM | POA: Diagnosis not present

## 2019-01-29 DIAGNOSIS — R269 Unspecified abnormalities of gait and mobility: Secondary | ICD-10-CM | POA: Diagnosis not present

## 2019-01-31 DIAGNOSIS — R269 Unspecified abnormalities of gait and mobility: Secondary | ICD-10-CM | POA: Diagnosis not present

## 2019-01-31 DIAGNOSIS — M25552 Pain in left hip: Secondary | ICD-10-CM | POA: Diagnosis not present

## 2019-02-05 DIAGNOSIS — R269 Unspecified abnormalities of gait and mobility: Secondary | ICD-10-CM | POA: Diagnosis not present

## 2019-02-05 DIAGNOSIS — M25552 Pain in left hip: Secondary | ICD-10-CM | POA: Diagnosis not present

## 2019-02-06 ENCOUNTER — Other Ambulatory Visit: Payer: Self-pay

## 2019-02-06 ENCOUNTER — Ambulatory Visit
Admission: EM | Admit: 2019-02-06 | Discharge: 2019-02-06 | Disposition: A | Discharge status: Home / Self Care | Payer: PPO | Attending: Family Medicine | Admitting: Family Medicine

## 2019-02-06 ENCOUNTER — Emergency Department: Payer: PPO

## 2019-02-06 ENCOUNTER — Emergency Department
Admission: EM | Admit: 2019-02-06 | Discharge: 2019-02-06 | Disposition: A | Discharge status: Home / Self Care | Payer: PPO | Attending: Emergency Medicine | Admitting: Emergency Medicine

## 2019-02-06 ENCOUNTER — Encounter: Payer: Self-pay | Admitting: *Deleted

## 2019-02-06 DIAGNOSIS — I10 Essential (primary) hypertension: Secondary | ICD-10-CM | POA: Diagnosis not present

## 2019-02-06 DIAGNOSIS — Z951 Presence of aortocoronary bypass graft: Secondary | ICD-10-CM | POA: Insufficient documentation

## 2019-02-06 DIAGNOSIS — I2510 Atherosclerotic heart disease of native coronary artery without angina pectoris: Secondary | ICD-10-CM | POA: Diagnosis not present

## 2019-02-06 DIAGNOSIS — Z7982 Long term (current) use of aspirin: Secondary | ICD-10-CM | POA: Insufficient documentation

## 2019-02-06 DIAGNOSIS — M546 Pain in thoracic spine: Secondary | ICD-10-CM

## 2019-02-06 DIAGNOSIS — R002 Palpitations: Secondary | ICD-10-CM

## 2019-02-06 DIAGNOSIS — Z79899 Other long term (current) drug therapy: Secondary | ICD-10-CM | POA: Insufficient documentation

## 2019-02-06 DIAGNOSIS — R9431 Abnormal electrocardiogram [ECG] [EKG]: Secondary | ICD-10-CM | POA: Diagnosis not present

## 2019-02-06 DIAGNOSIS — Z87891 Personal history of nicotine dependence: Secondary | ICD-10-CM | POA: Insufficient documentation

## 2019-02-06 DIAGNOSIS — G8929 Other chronic pain: Secondary | ICD-10-CM

## 2019-02-06 DIAGNOSIS — M549 Dorsalgia, unspecified: Secondary | ICD-10-CM | POA: Diagnosis not present

## 2019-02-06 LAB — CBC
HCT: 44.2 % (ref 39.0–52.0)
Hemoglobin: 14.5 g/dL (ref 13.0–17.0)
MCH: 27.6 pg (ref 26.0–34.0)
MCHC: 32.8 g/dL (ref 30.0–36.0)
MCV: 84.2 fL (ref 80.0–100.0)
Platelets: 226 10*3/uL (ref 150–400)
RBC: 5.25 MIL/uL (ref 4.22–5.81)
RDW: 12.5 % (ref 11.5–15.5)
WBC: 8.1 10*3/uL (ref 4.0–10.5)
nRBC: 0 % (ref 0.0–0.2)

## 2019-02-06 LAB — BASIC METABOLIC PANEL
Anion gap: 7 (ref 5–15)
BUN: 21 mg/dL (ref 8–23)
CO2: 29 mmol/L (ref 22–32)
Calcium: 9.6 mg/dL (ref 8.9–10.3)
Chloride: 102 mmol/L (ref 98–111)
Creatinine, Ser: 1.34 mg/dL — ABNORMAL HIGH (ref 0.61–1.24)
GFR calc Af Amer: 56 mL/min — ABNORMAL LOW (ref >60)
GFR calc non Af Amer: 48 mL/min — ABNORMAL LOW (ref >60)
Glucose, Bld: 103 mg/dL — ABNORMAL HIGH (ref 70–99)
Potassium: 4.8 mmol/L (ref 3.5–5.1)
Sodium: 138 mmol/L (ref 135–145)

## 2019-02-06 LAB — TROPONIN I (HIGH SENSITIVITY)
Troponin I (High Sensitivity): <2 ng/L (ref <18)
Troponin I (High Sensitivity): <2 ng/L (ref <18)

## 2019-02-06 MED ORDER — LABETALOL HCL 5 MG/ML IV SOLN
10.0000 mg | Freq: Once | INTRAVENOUS | Status: AC
  Administered 2019-02-06: 10 mg via INTRAVENOUS
  Filled 2019-02-06: qty 4

## 2019-02-06 MED ORDER — IOHEXOL 350 MG/ML SOLN
100.0000 mL | Freq: Once | INTRAVENOUS | Status: AC | PRN
  Administered 2019-02-06: 100 mL via INTRAVENOUS

## 2019-02-06 MED ORDER — SODIUM CHLORIDE 0.9% FLUSH: 3.0000 mL | Freq: Once | INTRAVENOUS | Status: DC

## 2019-02-06 MED ORDER — AMLODIPINE BESYLATE 5 MG PO TABS
5.0000 mg | ORAL_TABLET | Freq: Once | ORAL | Status: AC
  Administered 2019-02-06: 5 mg via ORAL
  Filled 2019-02-06: qty 1

## 2019-02-06 MED ORDER — AMLODIPINE BESYLATE 5 MG PO TABS: 5.0000 mg | ORAL_TABLET | Freq: Every day | ORAL | 1 refills | Status: DC

## 2019-02-06 NOTE — Discharge Instructions (Signed)
Your EKG was abnormal.  You are to go directly to the Emergency Room for further evaluation.

## 2019-02-06 NOTE — ED Provider Notes (Signed)
[facility] Emergency Department Provider Note  ____________________________________________  Time seen: Approximately 7:21 PM  I have reviewed the triage vital signs and the nursing notes.   HISTORY  Chief Complaint Back Pain    HPI ESTEPHAN [name] is a 86 y.o. male with a history of CAD, paroxysmal atrial fibrillation, CABG, hyperlipidemia who comes the ED complaining of mid back pain that started after lunchtime today.  He has a history of chronic low back pain due to arthritis, but this feels different.  Is nonradiating.  No aggravating or alleviating factors.  Was constant for a period of several hours, but is now resolved.  He denies any chest pain or shortness of breath, no exertional symptoms.  No lower extremity weakness or change in sensation.  Review of electronic medical record shows he has a history of a 3.2 cm AAA seen in April 2017 on CT scan.   Patient was initially seen in urgent care today, sent to the ED for evaluation of the back pain due to anterior ST depression.  On reviewing the EKGs, it is not significantly changed from previous, J-point depression is less than 1 mm.   Past Medical History:  Diagnosis Date  . Arthritis   . Coronary artery disease    a. 1993 s/p PCI/BMS LAD;  b. 2003 PCI RCA;  c. 09/2006 ISR LAD->PCI, PCI RCA;  d. 02/2007 MV: no ischemia;  d. 01/2012 MV: EF 74%, no ischemia.  [name] History of prostate cancer   . Hyperlipidemia   . Hyperthyroidism   . Low testosterone   . PAF (paroxysmal atrial fibrillation) ([place])    a. previously on amio ->stopped in 2013;  b. recurrent PAF 01/2012->Pradaxa and amio initiated, subsequently came off of pradaxa 2/2 cost;  c. 02/2012 Echo: EF 55-60%, Gr 1 DD.  [name] Prostate cancer ([place])   . S/P CABG x 4      Patient Active Problem List   Diagnosis Date Noted  . Chronic right-sided low back pain without sciatica 11/16/2018  . Gastroesophageal reflux disease without esophagitis 12/19/2017  .  Esophageal dysphagia   . Stricture and stenosis of esophagus   . Forgetfulness 06/07/2017  . Primary osteoarthritis involving multiple joints 09/06/2016  . Benign neoplasm of ascending colon   . AAA (abdominal aortic aneurysm) without rupture ([place]) 04/17/2015  . Constipation 09/23/2014  . Hardening of the aorta (main artery of the heart) ([place]) 04/02/2014  . Chronic back pain 04/02/2014  . ED (erectile dysfunction) of organic origin 02/19/2013  . Pre-diabetes 02/19/2013  . History of coronary artery bypass surgery 05/23/2012  . S/P Maze operation for atrial fibrillation 05/23/2012  . Postprocedural state 05/23/2012  . Personal history of prostate cancer 09/06/2011  . Male urinary stress incontinence 09/06/2011  . Testicular hypofunction 06/22/2011  . Insomnia 10/05/2010  . Hyperlipidemia 06/17/2009  . Benign essential hypertension 06/17/2009  . Coronary arteriosclerosis in native artery 06/17/2009  . History of atrial fibrillation 06/17/2009     Past Surgical History:  Procedure Laterality Date  . APPENDECTOMY     Dr. Bary Castilla  . CARDIAC CATHETERIZATION  2008  . CLIPPING OF ATRIAL APPENDAGE Left 05/18/2012   Procedure: CLIPPING OF ATRIAL APPENDAGE;  Surgeon: [name], MD;  Location: [place];  Service: Open Heart Surgery;  Laterality: Left;  . COLONOSCOPY  2013  . COLONOSCOPY WITH PROPOFOL N/A 07/12/2016   Procedure: COLONOSCOPY WITH PROPOFOL;  Surgeon: [name], MD;  Location: [facility] ENDOSCOPY;  Service: Endoscopy;  Laterality: N/A;  .  CORONARY ARTERY BYPASS GRAFT N/A 05/18/2012   Procedure: CORONARY ARTERY BYPASS GRAFTING (CABG);  Surgeon: [name], MD;  Location: [place];  Service: Open Heart Surgery;  Laterality: N/A;  Times 4 using left internal mammary artery and endoscopically harvested right saphenous vein  . ESOPHAGOGASTRODUODENOSCOPY (EGD) WITH PROPOFOL N/A 10/24/2017   Procedure: ESOPHAGOGASTRODUODENOSCOPY (EGD) WITH PROPOFOL;  Surgeon: [name], MD;  Location:  [facility] ENDOSCOPY;  Service: Endoscopy;  Laterality: N/A;  . HERNIA REPAIR     s/p mesh bilaterally, Dr. Bary Castilla  . INTRAOPERATIVE TRANSESOPHAGEAL ECHOCARDIOGRAM N/A 05/18/2012   Procedure: INTRAOPERATIVE TRANSESOPHAGEAL ECHOCARDIOGRAM;  Surgeon: [name], MD;  Location: [place];  Service: Open Heart Surgery;  Laterality: N/A;  . IR ESOPHAGUS DILITATION RETRO FLUORO    . LEFT HEART CATHETERIZATION WITH CORONARY ANGIOGRAM Bilateral 05/16/2012   Procedure: LEFT HEART CATHETERIZATION WITH CORONARY ANGIOGRAM;  Surgeon: Burnell Blanks, MD;  Location: [facility] CATH LAB;  Service: Cardiovascular;  Laterality: Bilateral;  . MAZE N/A 05/18/2012   Procedure: MAZE;  Surgeon: [name], MD;  Location: [place];  Service: Open Heart Surgery;  Laterality: N/A;  . MOHS SURGERY    . PROSTATECTOMY    . TOTAL HIP ARTHROPLASTY Right    Dr. [name]     Prior to Admission medications   Medication Sig Start Date End Date Taking? Authorizing Provider  Acetaminophen (TYLENOL ARTHRITIS PAIN PO) Take by mouth as needed.    [provider]  amLODipine (NORVASC) 5 MG tablet Take 1 tablet (5 mg total) by mouth daily. 02/06/19   Carrie Mew, MD  aspirin EC 81 MG tablet Take 81 mg by mouth every evening.    [provider]  baclofen (LIORESAL) 10 MG tablet Take 0.5-1 tablets (5-10 mg total) by mouth 3 (three) times daily as needed for muscle spasms. 11/16/18   Karamalegos, Devonne Doughty, DO  bisacodyl (DULCOLAX) 10 MG suppository Place 10 mg rectally daily as needed for moderate constipation.    [provider]  Cholecalciferol (VITAMIN D3) 1.25 MG (50000 UT) CAPS Take by mouth daily.    [provider]  ezetimibe (ZETIA) 10 MG tablet Take 1 tablet (10 mg total) by mouth daily. 12/19/18   Minna Merritts, MD  fluticasone (FLONASE) 50 MCG/ACT nasal spray Place 2 sprays into both nostrils daily. Use for 4-6 weeks then stop and use seasonally or as needed. 12/26/18   Karamalegos,  Devonne Doughty, DO  hydrocortisone 2.5 % cream  05/03/18   [provider]  lidocaine (LIDODERM) 5 % Place 1 patch onto the skin daily. Remove & Discard patch within 12 hours or as directed by MD 12/30/18   Laban Emperor, PA-C  pantoprazole (PROTONIX) 40 MG tablet Take 1 tablet (40 mg total) by mouth daily before breakfast. 11/16/18   Karamalegos, Devonne Doughty, DO  polyethylene glycol (MIRALAX / GLYCOLAX) packet Take 17 g by mouth daily as needed.    [provider]  simvastatin (ZOCOR) 20 MG tablet Take 1 tablet (20 mg total) by mouth at bedtime. 12/19/18   Minna Merritts, MD  testosterone cypionate (DEPOTESTOSTERONE CYPIONATE) 200 MG/ML injection Inject 0.3 mLs (60 mg total) into the muscle every 14 (fourteen) days. 09/20/18   Stoioff, Ronda Fairly, MD  timolol (TIMOPTIC) 0.5 % ophthalmic solution Place 1 drop into both eyes daily.  05/04/17   [provider]     Allergies Ambien [zolpidem tartrate], Pravastatin sodium, Rosuvastatin calcium, Statins, and Zolpidem tartrate   Family History  Problem Relation  Age of Onset  . Coronary artery disease Mother   . Heart disease Mother   . Coronary artery disease Sister   . Diabetes Sister   . Heart disease Sister 58       deceased  . Diabetes Father   . Heart disease Brother     Social History Social History   Tobacco Use  . Smoking status: Former Smoker    Types: Cigars    Quit date: 10/04/1988    Years since quitting: 30.3  . Smokeless tobacco: Former Systems developer    Types: Chew    Quit date: 09/04/1991  Substance Use Topics  . Alcohol use: Yes    Alcohol/week: 7.0 standard drinks    Types: 7 Glasses of wine per week    Comment: 1/3 cup a day of red wine   . Drug use: No    Review of Systems  Constitutional:   No fever or chills.  ENT:   No sore throat. No rhinorrhea. Cardiovascular:   No chest pain or syncope. Respiratory:   No dyspnea or cough. Gastrointestinal:   Negative for abdominal pain, vomiting and  diarrhea.  Musculoskeletal:   Positive back pain as above All other systems reviewed and are negative except as documented above in ROS and HPI.  ____________________________________________   PHYSICAL EXAM:  VITAL SIGNS: ED Triage Vitals [02/06/19 1739]  Enc Vitals Group     BP (!) 205/95     Pulse Rate 81     Resp 20     Temp 98.6 F (37 C)     Temp Source Oral     SpO2 95 %     Weight 160 lb (72.6 kg)     Height 6\' 1"  (1.854 m)     Head Circumference      Peak Flow      Pain Score 0     Pain Loc      Pain Edu?      Excl. in [place]?     Vital signs reviewed, nursing assessments reviewed.   Constitutional:   Alert and oriented. Non-toxic appearance. Eyes:   Conjunctivae are normal. EOMI. PERRL. ENT      Head:   Normocephalic and atraumatic.      Nose:   Wearing a mask.      Mouth/Throat:   Wearing a mask.      Neck:   No meningismus. Full ROM. Hematological/Lymphatic/Immunilogical:   No cervical lymphadenopathy. Cardiovascular:   RRR. Symmetric bilateral radial and DP pulses.  No murmurs. Cap refill less than 2 seconds.  Abdominal aortic pulsations are prominent without bruit Respiratory:   Normal respiratory effort without tachypnea/retractions. Breath sounds are clear and equal bilaterally. No wheezes/rales/rhonchi. Gastrointestinal: Normal active bowel sounds.  soft and nontender. Non distended. There is no CVA tenderness.  No rebound, rigidity, or guarding. Musculoskeletal:   Normal range of motion in all extremities. No joint effusions.  No lower extremity tenderness.  No edema.  No back tenderness, no midline tenderness. Neurologic:   Normal speech and language.  Motor grossly intact. No acute focal neurologic deficits are appreciated.  Skin:    Skin is warm, dry and intact. No rash noted.  No petechiae, purpura, or bullae.  ____________________________________________    LABS (pertinent positives/negatives) (all labs ordered are listed, but only abnormal  results are displayed) Labs Reviewed  BASIC METABOLIC PANEL - Abnormal; Notable for the following components:      Result Value   Glucose, Bld 103 (*)  Creatinine, Ser 1.34 (*)    GFR calc non Af Amer 48 (*)    GFR calc Af Amer 56 (*)    All other components within normal limits  CBC  TROPONIN I (HIGH SENSITIVITY)  TROPONIN I (HIGH SENSITIVITY)   ____________________________________________   EKG  Interpreted by me Sinus rhythm rate of 79, normal axis.  First-degree AV block.  Normal QRS ST segments and T waves.  ST depressions  ____________________________________________    RADIOLOGY  DG Chest 2 View  Result Date: 02/06/2019 CLINICAL DATA:  Back pain, mid back pain. EXAM: CHEST - 2 VIEW COMPARISON:  Chest CT of 12/15/2009 FINDINGS: Cardiomediastinal contours are normal. There are changes of median sternotomy for CABG and for left atrial clipping. There is signs of prior coronary stenting. Hilar structures are normal. Lungs are clear. No acute bone finding.  Spinal degenerative changes. IMPRESSION: No acute cardiopulmonary disease. Electronically Signed   By: Zetta Bills M.D.   On: 02/06/2019 18:33    ____________________________________________   PROCEDURES .Critical Care Performed by: Carrie Mew, MD Authorized by: Carrie Mew, MD   Critical care provider statement:    Critical care time (minutes):  33   Critical care time was exclusive of:  Separately billable procedures and treating other patients   Critical care was necessary to treat or prevent imminent or life-threatening deterioration of the following conditions:  Circulatory failure   Critical care was time spent personally by me on the following activities:  Development of treatment plan with patient or surrogate, discussions with consultants, evaluation of patient's response to treatment, examination of patient, obtaining history from patient or surrogate, ordering and performing treatments and  interventions, ordering and review of laboratory studies, ordering and review of radiographic studies, pulse oximetry, re-evaluation of patient's condition and review of old charts    ____________________________________________  DIFFERENTIAL DIAGNOSIS   Aortic dissection, enlarging AAA, musculoskeletal back pain, kidney stone  CLINICAL IMPRESSION / ASSESSMENT AND PLAN / ED COURSE  Medications ordered in the ED: Medications  sodium chloride flush (NS) 0.9 % injection 3 mL (has no administration in time range)  labetalol (NORMODYNE) injection 10 mg (has no administration in time range)  amLODipine (NORVASC) tablet 5 mg (has no administration in time range)    Pertinent labs & imaging results that were available during my care of the patient were reviewed by me and considered in my medical decision making (see chart for details).  Canelo Gutman Calles was evaluated in Emergency Department on 02/06/2019 for the symptoms described in the history of present illness. He was evaluated in the context of the global COVID-19 pandemic, which necessitated consideration that the patient might be at risk for infection with the SARS-CoV-2 virus that causes COVID-19. Institutional protocols and algorithms that pertain to the evaluation of patients at risk for COVID-19 are in a state of rapid change based on information released by regulatory bodies including the CDC and federal and state organizations. These policies and algorithms were followed during the patient's care in the ED.   Elderly man presents with pronounced hypertension, thoracic back pain, history of AAA.  He reports that his pain is now resolved and he wishes to go home.  Part of the concern urgent care was potential ST depressions on EKG, but he is not having any anginal symptoms, no chest pain or shortness of breath, and EKG here looks unremarkable.  I do not think further ischemic work-up is warranted.  Initial troponin was negative.  Recommended  CT  angiogram to evaluate for aortic pathology, which patient agrees.  I will also give IV labetalol for acute blood pressure management due to his current blood pressure about 220/120.  Also give a dose of oral amlodipine.  If there are no significant findings on CT, will plan to continue the patient on oral amlodipine for blood pressure management until he can see his primary care doctor.      ____________________________________________   FINAL CLINICAL IMPRESSION(S) / ED DIAGNOSES    Final diagnoses:  Acute midline thoracic back pain  Hypertension, unspecified type     ED Discharge Orders         Ordered    amLODipine (NORVASC) 5 MG tablet  Daily     02/06/19 1921          Portions of this note were generated with dragon dictation software. Dictation errors may occur despite best attempts at proofreading.   Carrie Mew, MD 02/06/19 1929

## 2019-02-06 NOTE — ED Notes (Signed)
Pt transported to CT scan.

## 2019-02-06 NOTE — ED Triage Notes (Addendum)
Pt presents with complaints of back pain and palpitations that started 45 minutes prior to arrival. Pt has history of abdominal aortic aneurysm. EKG completed during intake.

## 2019-02-06 NOTE — ED Triage Notes (Signed)
FIRST NURSE NOTE- pt sent from urgent care for back pain. Hx AAA.  EKG changes per urgent care showing ST depression.  Pulled next for triage

## 2019-02-06 NOTE — ED Provider Notes (Signed)
[name]    CSN: [id number] Arrival date & time: 02/06/19  1650      History   Chief Complaint No chief complaint on file.   HPI [name] is a 86 y.o. male.   Reports that he has been feeling a faster heart rate for the last 45 mins, took 3 baby aspirin in the car on the way to this office. Reports that he has had CABG and has a cardiologist. Cannot recall when he had EKG last. Reports palpitations and "fast heartbeat." Denies chest pain, pressure, diaphoresis. Reports mid back pain that has been there for "years" off and on. Reports that he took a half of a Baclofen with the Asprin and that his back is feeling much better. Denies pain radiation, denies injury, denies limited ROM.   The history is provided by the patient.    Past Medical History:  Diagnosis Date  . Arthritis   . Coronary artery disease    a. 1993 s/p PCI/BMS LAD;  b. 2003 PCI RCA;  c. 09/2006 ISR LAD->PCI, PCI RCA;  d. 02/2007 MV: no ischemia;  d. 01/2012 MV: EF 74%, no ischemia.  [name] History of prostate cancer   . Hyperlipidemia   . Hyperthyroidism   . Low testosterone   . PAF (paroxysmal atrial fibrillation) ([place])    a. previously on amio ->stopped in 2013;  b. recurrent PAF 01/2012->Pradaxa and amio initiated, subsequently came off of pradaxa 2/2 cost;  c. 02/2012 Echo: EF 55-60%, Gr 1 DD.  [name] Prostate cancer ([place])   . S/P CABG x 4     Patient Active Problem List   Diagnosis Date Noted  . Chronic right-sided low back pain without sciatica 11/16/2018  . Gastroesophageal reflux disease without esophagitis 12/19/2017  . Esophageal dysphagia   . Stricture and stenosis of esophagus   . Forgetfulness 06/07/2017  . Primary osteoarthritis involving multiple joints 09/06/2016  . Benign neoplasm of ascending colon   . AAA (abdominal aortic aneurysm) without rupture ([place]) 04/17/2015  . Constipation 09/23/2014  . Hardening of the aorta (main artery of the heart) ([place]) 04/02/2014  . Chronic back pain  04/02/2014  . ED (erectile dysfunction) of organic origin 02/19/2013  . Pre-diabetes 02/19/2013  . History of coronary artery bypass surgery 05/23/2012  . S/P Maze operation for atrial fibrillation 05/23/2012  . Postprocedural state 05/23/2012  . Personal history of prostate cancer 09/06/2011  . Male urinary stress incontinence 09/06/2011  . Testicular hypofunction 06/22/2011  . Insomnia 10/05/2010  . Hyperlipidemia 06/17/2009  . Benign essential hypertension 06/17/2009  . Coronary arteriosclerosis in native artery 06/17/2009  . History of atrial fibrillation 06/17/2009    Past Surgical History:  Procedure Laterality Date  . APPENDECTOMY     Dr. Bary Castilla  . CARDIAC CATHETERIZATION  2008  . CLIPPING OF ATRIAL APPENDAGE Left 05/18/2012   Procedure: CLIPPING OF ATRIAL APPENDAGE;  Surgeon: [name], MD;  Location: [place];  Service: Open Heart Surgery;  Laterality: Left;  . COLONOSCOPY  2013  . COLONOSCOPY WITH PROPOFOL N/A 07/12/2016   Procedure: COLONOSCOPY WITH PROPOFOL;  Surgeon: [name], MD;  Location: [facility] ENDOSCOPY;  Service: Endoscopy;  Laterality: N/A;  . CORONARY ARTERY BYPASS GRAFT N/A 05/18/2012   Procedure: CORONARY ARTERY BYPASS GRAFTING (CABG);  Surgeon: [name], MD;  Location: [place];  Service: Open Heart Surgery;  Laterality: N/A;  Times 4 using left internal mammary artery and endoscopically harvested right saphenous vein  . ESOPHAGOGASTRODUODENOSCOPY (EGD) WITH  PROPOFOL N/A 10/24/2017   Procedure: ESOPHAGOGASTRODUODENOSCOPY (EGD) WITH PROPOFOL;  Surgeon: [name], MD;  Location: [facility] ENDOSCOPY;  Service: Endoscopy;  Laterality: N/A;  . HERNIA REPAIR     s/p mesh bilaterally, Dr. Bary Castilla  . INTRAOPERATIVE TRANSESOPHAGEAL ECHOCARDIOGRAM N/A 05/18/2012   Procedure: INTRAOPERATIVE TRANSESOPHAGEAL ECHOCARDIOGRAM;  Surgeon: [name], MD;  Location: [place];  Service: Open Heart Surgery;  Laterality: N/A;  . IR ESOPHAGUS DILITATION RETRO FLUORO    . LEFT  HEART CATHETERIZATION WITH CORONARY ANGIOGRAM Bilateral 05/16/2012   Procedure: LEFT HEART CATHETERIZATION WITH CORONARY ANGIOGRAM;  Surgeon: Burnell Blanks, MD;  Location: [facility] CATH LAB;  Service: Cardiovascular;  Laterality: Bilateral;  . MAZE N/A 05/18/2012   Procedure: MAZE;  Surgeon: [name], MD;  Location: [place];  Service: Open Heart Surgery;  Laterality: N/A;  . MOHS SURGERY    . PROSTATECTOMY    . TOTAL HIP ARTHROPLASTY Right    Dr. [name]       Home Medications    Prior to Admission medications   Medication Sig Start Date End Date Taking? Authorizing Provider  Acetaminophen (TYLENOL ARTHRITIS PAIN PO) Take by mouth as needed.    [provider]  aspirin EC 81 MG tablet Take 81 mg by mouth every evening.    [provider]  baclofen (LIORESAL) 10 MG tablet Take 0.5-1 tablets (5-10 mg total) by mouth 3 (three) times daily as needed for muscle spasms. 11/16/18   Karamalegos, Devonne Doughty, DO  bisacodyl (DULCOLAX) 10 MG suppository Place 10 mg rectally daily as needed for moderate constipation.    [provider]  Cholecalciferol (VITAMIN D3) 1.25 MG (50000 UT) CAPS Take by mouth daily.    [provider]  ezetimibe (ZETIA) 10 MG tablet Take 1 tablet (10 mg total) by mouth daily. 12/19/18   Minna Merritts, MD  fluticasone (FLONASE) 50 MCG/ACT nasal spray Place 2 sprays into both nostrils daily. Use for 4-6 weeks then stop and use seasonally or as needed. 12/26/18   Karamalegos, Devonne Doughty, DO  hydrocortisone 2.5 % cream  05/03/18   [provider]  lidocaine (LIDODERM) 5 % Place 1 patch onto the skin daily. Remove & Discard patch within 12 hours or as directed by MD 12/30/18   Laban Emperor, PA-C  pantoprazole (PROTONIX) 40 MG tablet Take 1 tablet (40 mg total) by mouth daily before breakfast. 11/16/18   Karamalegos, Devonne Doughty, DO  polyethylene glycol (MIRALAX / GLYCOLAX) packet Take 17 g by mouth daily as needed.    [provider]  simvastatin (ZOCOR) 20 MG tablet Take 1 tablet (20 mg total) by mouth at bedtime. 12/19/18   Minna Merritts, MD  testosterone cypionate (DEPOTESTOSTERONE CYPIONATE) 200 MG/ML injection Inject 0.3 mLs (60 mg total) into the muscle every 14 (fourteen) days. 09/20/18   Stoioff, Ronda Fairly, MD  timolol (TIMOPTIC) 0.5 % ophthalmic solution Place 1 drop into both eyes daily.  05/04/17   [provider]    Family History Family History  Problem Relation Age of Onset  . Coronary artery disease Mother   . Heart disease Mother   . Coronary artery disease Sister   . Diabetes Sister   . Heart disease Sister 28       deceased  . Diabetes Father   . Heart disease Brother     Social History Social History   Tobacco Use  . Smoking status: Former Smoker    Types: Cigars    Quit  date: 10/04/1988    Years since quitting: 30.3  . Smokeless tobacco: Former Systems developer    Types: Chew    Quit date: 09/04/1991  Substance Use Topics  . Alcohol use: Yes    Alcohol/week: 7.0 standard drinks    Types: 7 Glasses of wine per week    Comment: 1/3 cup a day of red wine   . Drug use: No     Allergies   Ambien [zolpidem tartrate], Pravastatin sodium, Rosuvastatin calcium, Statins, and Zolpidem tartrate   Review of Systems Review of Systems  Constitutional: Negative for chills, diaphoresis, fatigue and fever.  HENT: Negative for ear pain and sore throat.   Eyes: Negative for pain and visual disturbance.  Respiratory: Negative for cough and shortness of breath.   Cardiovascular: Positive for palpitations. Negative for chest pain.  Gastrointestinal: Negative for abdominal pain, diarrhea, nausea and vomiting.  Genitourinary: Negative for dysuria and hematuria.  Musculoskeletal: Negative for arthralgias and back pain.  Skin: Negative for color change and rash.  Neurological: Negative for dizziness, seizures, syncope, weakness, light-headedness, numbness and headaches.  All other systems  reviewed and are negative.    Physical Exam Triage Vital Signs ED Triage Vitals  Enc Vitals Group     BP      Pulse      Resp      Temp      Temp src      SpO2      Weight      Height      Head Circumference      Peak Flow      Pain Score      Pain Loc      Pain Edu?      Excl. in [place]?    No data found.  Updated Vital Signs BP (!) 168/97 (BP Location: Left Arm)   Pulse 81   Temp 99.1 F (37.3 C) (Oral)   Resp 18   SpO2 98%   Visual Acuity Right Eye Distance:   Left Eye Distance:   Bilateral Distance:    Right Eye Near:   Left Eye Near:    Bilateral Near:     Physical Exam Vitals and nursing note reviewed.  Constitutional:      General: He is not in acute distress.    Appearance: He is well-developed.  HENT:     Head: Normocephalic and atraumatic.     Nose: Nose normal.     Mouth/Throat:     Mouth: Mucous membranes are moist.  Eyes:     Conjunctiva/sclera: Conjunctivae normal.     Pupils: Pupils are equal, round, and reactive to light.  Neck:     Vascular: No carotid bruit.  Cardiovascular:     Rate and Rhythm: Normal rate and regular rhythm.     Pulses: Normal pulses.     Heart sounds: Normal heart sounds. No murmur.  Pulmonary:     Effort: Pulmonary effort is normal. No respiratory distress.     Breath sounds: Normal breath sounds. No stridor. No wheezing, rhonchi or rales.  Abdominal:     General: Abdomen is flat. There is no distension.     Palpations: Abdomen is soft.     Tenderness: There is no abdominal tenderness.  Musculoskeletal:        General: No swelling, tenderness or signs of injury. Normal range of motion.     Cervical back: Normal range of motion and neck supple. No rigidity or tenderness.  Lymphadenopathy:  Cervical: No cervical adenopathy.  Skin:    General: Skin is warm and dry.  Neurological:     General: No focal deficit present.     Mental Status: He is alert and oriented to person, place, and time.     Motor: No  weakness.  Psychiatric:        Mood and Affect: Mood normal.        Behavior: Behavior normal.      UC Treatments / Results  Labs (all labs ordered are listed, but only abnormal results are displayed) Labs Reviewed - No data to display  EKG   Radiology No results found.  Procedures Procedures (including critical care time)  Medications Ordered in UC Medications - No data to display  Initial Impression / Assessment and Plan / UC Course  I have reviewed the triage vital signs and the nursing notes.  Pertinent labs & imaging results that were available during my care of the patient were reviewed by me and considered in my medical decision making (see chart for details).     Presents with palpitations and mid back pain. Significant cardiac history includes A fib, AAA without rupture, CAD, CABG. EKG today shows rate 78, 1st degree AV block (same as previous EKG), also shows 67mm ST depression in leads V1, V3, V4, V5 that were not on previous EKG. Compared to last EKG on 06/27/17. Instructed to go to the Emergency Department for further evaluation. Sent message to Dr Jodell Cipro about patient coming over to the ER. Final Clinical Impressions(s) / UC Diagnoses   Final diagnoses:  Nonspecific abnormal electrocardiogram (ECG) (EKG)  Palpitations  Chronic midline thoracic back pain     Discharge Instructions     Your EKG was abnormal.  You are to go directly to the Emergency Room for further evaluation.    ED Prescriptions    None     PDMP not reviewed this encounter.   Faustino Congress, NP 02/06/19 1734

## 2019-02-06 NOTE — ED Provider Notes (Signed)
-----------------------------------------   10:17 PM on 02/06/2019 -----------------------------------------  I took over care of this patient from Dr. Joni Fears.  The patient was pending CT chest and a repeat troponin, both of which are negative for acute findings.  The patient feels comfortable and wants to go home.  He is stable for discharge at this time.  I counseled him on the results of these tests and the plan of care.  Return precautions given, he expresses understanding.   Arta Silence, MD 02/06/19 2218

## 2019-02-06 NOTE — ED Triage Notes (Signed)
Pt ambulatory to triage.  Pt sent from [facility] office for eval of back pain.  No chest pain or sob.  Hx cabg at [facility].  No n/v/d  No diaphoresis.  Pt alert  Speech clear.

## 2019-02-06 NOTE — ED Notes (Addendum)
Patient is being discharged from the [facility] and sent to the Emergency Department via private vehicle. Per Faustino Congress NP, patient is in need of higher level of care due to extensive history and complaints of back pain and palpitations. Patient is aware and verbalizes understanding of plan of care. Pt and family refused EMS transport and states will go by private vehicle. Report called to [facility] ED. Vitals:   02/06/19 1705  BP: (!) 168/97  Pulse: 81  Resp: 18  Temp: 99.1 F (37.3 C)  SpO2: 98%

## 2019-02-07 DIAGNOSIS — M25552 Pain in left hip: Secondary | ICD-10-CM | POA: Diagnosis not present

## 2019-02-07 DIAGNOSIS — R269 Unspecified abnormalities of gait and mobility: Secondary | ICD-10-CM | POA: Diagnosis not present

## 2019-02-12 DIAGNOSIS — M25552 Pain in left hip: Secondary | ICD-10-CM | POA: Diagnosis not present

## 2019-02-12 DIAGNOSIS — M1612 Unilateral primary osteoarthritis, left hip: Secondary | ICD-10-CM | POA: Diagnosis not present

## 2019-02-20 DIAGNOSIS — H401121 Primary open-angle glaucoma, left eye, mild stage: Secondary | ICD-10-CM | POA: Diagnosis not present

## 2019-02-21 DIAGNOSIS — L02512 Cutaneous abscess of left hand: Secondary | ICD-10-CM | POA: Diagnosis not present

## 2019-02-21 DIAGNOSIS — L03012 Cellulitis of left finger: Secondary | ICD-10-CM | POA: Diagnosis not present

## 2019-02-22 DIAGNOSIS — L0291 Cutaneous abscess, unspecified: Secondary | ICD-10-CM | POA: Diagnosis not present

## 2019-02-27 ENCOUNTER — Other Ambulatory Visit: Payer: Self-pay | Admitting: Urology

## 2019-03-11 DIAGNOSIS — G8929 Other chronic pain: Secondary | ICD-10-CM

## 2019-03-11 DIAGNOSIS — I10 Essential (primary) hypertension: Secondary | ICD-10-CM

## 2019-03-12 MED ORDER — MELOXICAM 15 MG PO TABS: 15.0000 mg | ORAL_TABLET | Freq: Every day | ORAL | 1 refills | Status: DC | PRN

## 2019-03-12 MED ORDER — MELOXICAM 15 MG PO TABS: 15.0000 mg | ORAL_TABLET | Freq: Every day | ORAL | 2 refills | Status: DC | PRN

## 2019-03-12 MED ORDER — AMLODIPINE BESYLATE 5 MG PO TABS: 5.0000 mg | ORAL_TABLET | Freq: Every day | ORAL | 2 refills | Status: DC

## 2019-03-12 MED ORDER — AMLODIPINE BESYLATE 5 MG PO TABS: 5.0000 mg | ORAL_TABLET | Freq: Every day | ORAL | 1 refills | Status: DC

## 2019-03-12 NOTE — Addendum Note (Signed)
Addended by: [name] on: 03/12/2019 05:48 PM   Modules accepted: Orders

## 2019-03-20 DIAGNOSIS — H401121 Primary open-angle glaucoma, left eye, mild stage: Secondary | ICD-10-CM | POA: Diagnosis not present

## 2019-04-24 ENCOUNTER — Ambulatory Visit: Payer: PPO | Admitting: Urology

## 2019-04-28 NOTE — Progress Notes (Signed)
04/29/19 6:57 PM   [name] 07/18/1932 [id number]  Referring provider: [name], DO [address] [place],  [place] [address]  Chief Complaint  Patient presents with  . Hypogonadism   Urologic history: 1.History prostate cancer low risk -Radical prostatectomy 2005; PSAs remain undetectable  2.Symptomatic hypogonadism -TRT started 2013  HPI: [name] is a 86 y.o. M who returns today for a 6 month f/u for symptom recheck.   -started on TRT 2013 for symptoms of excessive sweating and flushing. -symptoms resolved on TRT. -60 mg testosterone cypionate every 2 weeks -reports reoccurence of stress incontinence recently  -denies any bothersome urinary symptoms  -Blood work 10/17/18, PSA remains undetectable, testosterone 559, hematocrit 44.1  PMH: Past Medical History:  Diagnosis Date  . Arthritis   . Coronary artery disease    a. 1993 s/p PCI/BMS LAD;  b. 2003 PCI RCA;  c. 09/2006 ISR LAD->PCI, PCI RCA;  d. 02/2007 MV: no ischemia;  d. 01/2012 MV: EF 74%, no ischemia.  [name] History of prostate cancer   . Hyperlipidemia   . Hyperthyroidism   . Low testosterone   . PAF (paroxysmal atrial fibrillation) ([place])    a. previously on amio ->stopped in 2013;  b. recurrent PAF 01/2012->Pradaxa and amio initiated, subsequently came off of pradaxa 2/2 cost;  c. 02/2012 Echo: EF 55-60%, Gr 1 DD.  [name] Prostate cancer ([place])   . S/P CABG x 4     Surgical History: Past Surgical History:  Procedure Laterality Date  . APPENDECTOMY     Dr. Bary Castilla  . CARDIAC CATHETERIZATION  2008  . CLIPPING OF ATRIAL APPENDAGE Left 05/18/2012   Procedure: CLIPPING OF ATRIAL APPENDAGE;  Surgeon: [name], MD;  Location: [place];  Service: Open Heart Surgery;  Laterality: Left;  . COLONOSCOPY  2013  . COLONOSCOPY WITH PROPOFOL N/A 07/12/2016   Procedure: COLONOSCOPY WITH PROPOFOL;  Surgeon: [name], MD;  Location: [facility] ENDOSCOPY;  Service: Endoscopy;  Laterality: N/A;  . CORONARY  ARTERY BYPASS GRAFT N/A 05/18/2012   Procedure: CORONARY ARTERY BYPASS GRAFTING (CABG);  Surgeon: [name], MD;  Location: [place];  Service: Open Heart Surgery;  Laterality: N/A;  Times 4 using left internal mammary artery and endoscopically harvested right saphenous vein  . ESOPHAGOGASTRODUODENOSCOPY (EGD) WITH PROPOFOL N/A 10/24/2017   Procedure: ESOPHAGOGASTRODUODENOSCOPY (EGD) WITH PROPOFOL;  Surgeon: [name], MD;  Location: [facility] ENDOSCOPY;  Service: Endoscopy;  Laterality: N/A;  . HERNIA REPAIR     s/p mesh bilaterally, Dr. Bary Castilla  . INTRAOPERATIVE TRANSESOPHAGEAL ECHOCARDIOGRAM N/A 05/18/2012   Procedure: INTRAOPERATIVE TRANSESOPHAGEAL ECHOCARDIOGRAM;  Surgeon: [name], MD;  Location: [place];  Service: Open Heart Surgery;  Laterality: N/A;  . IR ESOPHAGUS DILITATION RETRO FLUORO    . LEFT HEART CATHETERIZATION WITH CORONARY ANGIOGRAM Bilateral 05/16/2012   Procedure: LEFT HEART CATHETERIZATION WITH CORONARY ANGIOGRAM;  Surgeon: Burnell Blanks, MD;  Location: [facility] CATH LAB;  Service: Cardiovascular;  Laterality: Bilateral;  . MAZE N/A 05/18/2012   Procedure: MAZE;  Surgeon: [name], MD;  Location: [place];  Service: Open Heart Surgery;  Laterality: N/A;  . MOHS SURGERY    . PROSTATECTOMY    . TOTAL HIP ARTHROPLASTY Right    Dr. [name]    Home Medications:  Allergies as of 04/29/2019      Reactions   Ambien [zolpidem Tartrate] Other (See Comments)   Super sensitive.    Pravastatin Sodium    REACTION: muscle aches...crestor  Rosuvastatin Calcium    REACTION: muscle aches...crestor   Statins    REACTION: muscle aches...crestor   Zolpidem Tartrate       Medication List       Accurate as of April 29, 2019 11:59 PM. If you have any questions, ask your nurse or doctor.        amLODipine 5 MG tablet Commonly known as: NORVASC Take 1 tablet (5 mg total) by mouth daily.   aspirin EC 81 MG tablet Take 81 mg by mouth every evening.   baclofen 10 MG  tablet Commonly known as: LIORESAL Take 0.5-1 tablets (5-10 mg total) by mouth 3 (three) times daily as needed for muscle spasms.   bisacodyl 10 MG suppository Commonly known as: DULCOLAX Place 10 mg rectally daily as needed for moderate constipation.   ezetimibe 10 MG tablet Commonly known as: ZETIA Take 1 tablet (10 mg total) by mouth daily.   fluticasone 50 MCG/ACT nasal spray Commonly known as: FLONASE Place 2 sprays into both nostrils daily. Use for 4-6 weeks then stop and use seasonally or as needed.   hydrocortisone 2.5 % cream   lidocaine 5 % Commonly known as: Lidoderm Place 1 patch onto the skin daily. Remove & Discard patch within 12 hours or as directed by MD   meloxicam 15 MG tablet Commonly known as: MOBIC Take 1 tablet (15 mg total) by mouth daily as needed for pain.   pantoprazole 40 MG tablet Commonly known as: PROTONIX Take 1 tablet (40 mg total) by mouth daily before breakfast.   polyethylene glycol 17 g packet Commonly known as: MIRALAX / GLYCOLAX Take 17 g by mouth daily as needed.   simvastatin 20 MG tablet Commonly known as: ZOCOR Take 1 tablet (20 mg total) by mouth at bedtime.   testosterone cypionate 200 MG/ML injection Commonly known as: DEPOTESTOSTERONE CYPIONATE INJECT 0.3 MLS (60 MG TOTAL) INTO THE MUSCLE EVERY 14 DAYS   timolol 0.5 % ophthalmic solution Commonly known as: TIMOPTIC Place 1 drop into both eyes daily.   TYLENOL ARTHRITIS PAIN PO Take by mouth as needed.   Vitamin D3 1.25 MG (50000 UT) Caps Take by mouth daily.       Allergies:  Allergies  Allergen Reactions  . Ambien [Zolpidem Tartrate] Other (See Comments)    Super sensitive.   . Pravastatin Sodium     REACTION: muscle aches...crestor  . Rosuvastatin Calcium     REACTION: muscle aches...crestor  . Statins     REACTION: muscle aches...crestor  . Zolpidem Tartrate     Family History: Family History  Problem Relation Age of Onset  . Coronary artery  disease Mother   . Heart disease Mother   . Coronary artery disease Sister   . Diabetes Sister   . Heart disease Sister 13       deceased  . Diabetes Father   . Heart disease Brother     Social History:  reports that he quit smoking about 30 years ago. His smoking use included cigars. He quit smokeless tobacco use about 27 years ago.  His smokeless tobacco use included chew. He reports current alcohol use of about 7.0 standard drinks of alcohol per week. He reports that he does not use drugs.   Physical Exam: BP 127/77   Pulse 81   Ht 6' (1.829 m)   Wt 160 lb (72.6 kg)   BMI 21.70 kg/m   Constitutional:  Alert and oriented, No acute distress. HEENT: [place] AT, moist mucus  membranes.  Trachea midline, no masses. Cardiovascular: No clubbing, cyanosis, or edema. Respiratory: Normal respiratory effort, no increased work of breathing. Skin: No rashes, bruises or suspicious lesions. Neurologic: Grossly intact, no focal deficits, moving all 4 extremities. Psychiatric: Normal mood and affect.  Laboratory Data:  Lab Results  Component Value Date   CREATININE 1.34 (H) 02/06/2019    Lab Results  Component Value Date   TESTOSTERONE 693 04/29/2019    Assessment & Plan:    1. Hypogonadism PSA, testosterone, and hematocrit stable  Currently on testosterone cypionate q 2 weeks  Return in 6 months for blood work (PSA, testosterone, hematocrit) and in 1 year for MD visit   2. Lower urinary tract symptoms Stress urinary incontinence not bothersome enough that he desires treatment  3. Hx of prostate cancer  PSA remains undetectable   [facility] [address], [place], [place] [address] [phone]  I, Lucas Mallow, am acting as a scribe for Dr. Nicki Reaper C. Woodley Petzold,  I have reviewed the above documentation for accuracy and completeness, and I agree with the above.    Abbie Sons, MD

## 2019-04-29 ENCOUNTER — Other Ambulatory Visit: Payer: Self-pay

## 2019-04-29 ENCOUNTER — Encounter: Payer: Self-pay | Admitting: Urology

## 2019-04-29 ENCOUNTER — Ambulatory Visit: Payer: PPO | Admitting: Urology

## 2019-04-29 VITALS — BP 127/77 | HR 81 | Ht 72.0 in | Wt 160.0 lb

## 2019-04-29 DIAGNOSIS — Z8546 Personal history of malignant neoplasm of prostate: Secondary | ICD-10-CM

## 2019-04-29 DIAGNOSIS — E291 Testicular hypofunction: Secondary | ICD-10-CM | POA: Diagnosis not present

## 2019-04-30 ENCOUNTER — Telehealth: Payer: Self-pay | Admitting: *Deleted

## 2019-04-30 LAB — HEMATOCRIT: Hematocrit: 40.1 % (ref 37.5–51.0)

## 2019-04-30 LAB — PSA: Prostate Specific Ag, Serum: <0.1 ng/mL (ref 0.0–4.0)

## 2019-04-30 LAB — TESTOSTERONE: Testosterone: 693 ng/dL (ref 264–916)

## 2019-04-30 NOTE — Telephone Encounter (Signed)
Notified patient as instructed, patient pleased. Discussed follow-up appointments, patient agrees  

## 2019-04-30 NOTE — Telephone Encounter (Signed)
-----   Message from Abbie Sons, MD sent at 04/30/2019  1:54 PM EDT ----- Testosterone level looks good at 693.  Hematocrit was normal and PSA remains undetectable at <0.1

## 2019-05-01 ENCOUNTER — Encounter: Payer: Self-pay | Admitting: Urology

## 2019-05-13 ENCOUNTER — Other Ambulatory Visit: Payer: Self-pay

## 2019-05-13 ENCOUNTER — Other Ambulatory Visit: Payer: PPO

## 2019-05-13 DIAGNOSIS — E782 Mixed hyperlipidemia: Secondary | ICD-10-CM

## 2019-05-13 DIAGNOSIS — I10 Essential (primary) hypertension: Secondary | ICD-10-CM | POA: Diagnosis not present

## 2019-05-13 DIAGNOSIS — R7303 Prediabetes: Secondary | ICD-10-CM

## 2019-05-14 LAB — CBC WITH DIFFERENTIAL/PLATELET
Absolute Monocytes: 552 cells/uL (ref 200–950)
Basophils Absolute: 18 cells/uL (ref 0–200)
Basophils Relative: 0.3 %
Eosinophils Absolute: 120 cells/uL (ref 15–500)
Eosinophils Relative: 2 %
HCT: 40.3 % (ref 38.5–50.0)
Hemoglobin: 13.2 g/dL (ref 13.2–17.1)
Lymphs Abs: 1038 cells/uL (ref 850–3900)
MCH: 28.3 pg (ref 27.0–33.0)
MCHC: 32.8 g/dL (ref 32.0–36.0)
MCV: 86.5 fL (ref 80.0–100.0)
MPV: 11.3 fL (ref 7.5–12.5)
Monocytes Relative: 9.2 %
Neutro Abs: 4272 cells/uL (ref 1500–7800)
Neutrophils Relative %: 71.2 %
Platelets: 235 10*3/uL (ref 140–400)
RBC: 4.66 10*6/uL (ref 4.20–5.80)
RDW: 12.8 % (ref 11.0–15.0)
Total Lymphocyte: 17.3 %
WBC: 6 10*3/uL (ref 3.8–10.8)

## 2019-05-14 LAB — COMPLETE METABOLIC PANEL WITH GFR
AG Ratio: 1.7 (calc) (ref 1.0–2.5)
ALT: 10 U/L (ref 9–46)
AST: 13 U/L (ref 10–35)
Albumin: 4.3 g/dL (ref 3.6–5.1)
Alkaline phosphatase (APISO): 76 U/L (ref 35–144)
BUN/Creatinine Ratio: 17 (calc) (ref 6–22)
BUN: 22 mg/dL (ref 7–25)
CO2: 27 mmol/L (ref 20–32)
Calcium: 9.6 mg/dL (ref 8.6–10.3)
Chloride: 105 mmol/L (ref 98–110)
Creat: 1.31 mg/dL — ABNORMAL HIGH (ref 0.70–1.11)
GFR, Est African American: 58 mL/min/{1.73_m2} — ABNORMAL LOW (ref >=60)
GFR, Est Non African American: 50 mL/min/{1.73_m2} — ABNORMAL LOW (ref >=60)
Globulin: 2.5 g/dL (calc) (ref 1.9–3.7)
Glucose, Bld: 125 mg/dL — ABNORMAL HIGH (ref 65–99)
Potassium: 4.3 mmol/L (ref 3.5–5.3)
Sodium: 140 mmol/L (ref 135–146)
Total Bilirubin: 1 mg/dL (ref 0.2–1.2)
Total Protein: 6.8 g/dL (ref 6.1–8.1)

## 2019-05-14 LAB — LIPID PANEL
Cholesterol: 128 mg/dL (ref <200)
HDL: 41 mg/dL (ref >=40)
LDL Cholesterol (Calc): 72 mg/dL (calc)
Non-HDL Cholesterol (Calc): 87 mg/dL (calc) (ref <130)
Total CHOL/HDL Ratio: 3.1 (calc) (ref <5.0)
Triglycerides: 66 mg/dL (ref <150)

## 2019-05-14 LAB — HEMOGLOBIN A1C
Hgb A1c MFr Bld: 5.7 % of total Hgb — ABNORMAL HIGH (ref <5.7)
Mean Plasma Glucose: 117 (calc)
eAG (mmol/L): 6.5 (calc)

## 2019-05-17 ENCOUNTER — Encounter: Payer: Self-pay | Admitting: Family Medicine

## 2019-05-17 ENCOUNTER — Other Ambulatory Visit: Payer: Self-pay

## 2019-05-17 ENCOUNTER — Other Ambulatory Visit: Payer: Self-pay | Admitting: Family Medicine

## 2019-05-17 ENCOUNTER — Ambulatory Visit (INDEPENDENT_AMBULATORY_CARE_PROVIDER_SITE_OTHER): Payer: PPO | Admitting: Family Medicine

## 2019-05-17 VITALS — BP 138/74 | HR 69 | Temp 97.3°F | Ht 72.0 in | Wt 160.4 lb

## 2019-05-17 DIAGNOSIS — I2510 Atherosclerotic heart disease of native coronary artery without angina pectoris: Secondary | ICD-10-CM

## 2019-05-17 DIAGNOSIS — N183 Chronic kidney disease, stage 3 unspecified: Secondary | ICD-10-CM | POA: Diagnosis not present

## 2019-05-17 DIAGNOSIS — F03A0 Unspecified dementia, mild, without behavioral disturbance, psychotic disturbance, mood disturbance, and anxiety: Secondary | ICD-10-CM

## 2019-05-17 DIAGNOSIS — R7303 Prediabetes: Secondary | ICD-10-CM

## 2019-05-17 DIAGNOSIS — I700 Atherosclerosis of aorta: Secondary | ICD-10-CM

## 2019-05-17 DIAGNOSIS — I129 Hypertensive chronic kidney disease with stage 1 through stage 4 chronic kidney disease, or unspecified chronic kidney disease: Secondary | ICD-10-CM

## 2019-05-17 DIAGNOSIS — I480 Paroxysmal atrial fibrillation: Secondary | ICD-10-CM | POA: Diagnosis not present

## 2019-05-17 DIAGNOSIS — R351 Nocturia: Secondary | ICD-10-CM

## 2019-05-17 DIAGNOSIS — M8949 Other hypertrophic osteoarthropathy, multiple sites: Secondary | ICD-10-CM | POA: Diagnosis not present

## 2019-05-17 DIAGNOSIS — Z0000 Encounter for general adult medical examination without abnormal findings: Secondary | ICD-10-CM

## 2019-05-17 DIAGNOSIS — H401133 Primary open-angle glaucoma, bilateral, severe stage: Secondary | ICD-10-CM | POA: Diagnosis not present

## 2019-05-17 DIAGNOSIS — F0390 Unspecified dementia without behavioral disturbance: Secondary | ICD-10-CM

## 2019-05-17 DIAGNOSIS — E782 Mixed hyperlipidemia: Secondary | ICD-10-CM

## 2019-05-17 DIAGNOSIS — M159 Polyosteoarthritis, unspecified: Secondary | ICD-10-CM

## 2019-05-17 NOTE — Progress Notes (Signed)
Subjective:    Patient ID: [name], male    DOB: 02-Jun-1932, 86 y.o.   MRN: [id number]  [name] is a 86 y.o. male presenting on 05/17/2019 for PreDM (6 month follow up after lab work)   HPI   CHRONIC HTN w/ CKD-III Review of recent lab trend Cr increased in past 3 months up to Cr 1.3, GFR < 60, concerning for trend to new diagnosis CKD, however prior data to 2008 had similar Cr 1.2 Current Meds - Amlodipine 5mg  daily Reports good compliance, took meds today. Tolerating well, w/o complaints. Denies CP, dyspnea, HA, edema, dizziness / lightheadedness  Lower Extremity Weakness Chronic low Back Pain / Arthritis Reports gradual non focal weakness of lower extremity, he does some exercises at hospital gym at times, but he does low weight and a lot of reps, he doesn't think statin is affecting his leg muscles, is on rosuvastatin and zetia, see below. He says not cramping or pain. Just feels weaker, not caused him to fall. Taking Baclofen half of 10mg  pill PRN for back pain at times, PRN with good results Has rx Meloxicam at pharmacy never picked it up, but willing to try if needed  Mild Dementia Has some chronic memory loss issues. No new acute concerns or behavioral changes.  Paroxysmal Atrial Fibrillation Followed by Cardiology Dr Rockey Situ.  Glaucoma Eye Doctor following him, on eye drops [place] 05/17/19  Hypogonadism, low T Does testosterone injections, had issue with last injection but overall doing well. Followed by Dr Bernardo Heater  Pre-Diabetes Previously controlled at range A1c 5.6 to 5.8 Today recent labs reviewed at A1c 5.7 CBGs: not checking Meds:none Currentlynot onACEi / ARB Lifestyle: - Diet (limited sugars, improving diet) - Exercise (walking) Denies hypoglycemia, polyuria, visual changes, numbness or tingling.  HYPERLIPIDEMIA: - Reports no concerns. Last lipid panel 05/2019, controlled  - Currently taking Zetia 10mg  and Rosuvastatin 20mg  nightly,  tolerating well without side effects or myalgias  Health Maintenance: UTD COVID19 vaccine  Depression screen [facility] 2/9 05/17/2019 12/25/2018 11/16/2018  Decreased Interest 0 0 0  Down, Depressed, Hopeless 0 0 0  PHQ - 2 Score 0 0 0  Difficult doing work/chores - - -    Social History   Tobacco Use  . Smoking status: Former Smoker    Types: Cigars    Quit date: 10/04/1988    Years since quitting: 30.6  . Smokeless tobacco: Former Systems developer    Types: Chew    Quit date: 09/04/1991  Substance Use Topics  . Alcohol use: Yes    Alcohol/week: 7.0 standard drinks    Types: 7 Glasses of wine per week    Comment: 1/3 cup a day of red wine   . Drug use: No    Review of Systems Per HPI unless specifically indicated above     Objective:    BP 138/74   Pulse 69   Temp (!) 97.3 F (36.3 C) (Temporal)   Ht 6' (1.829 m)   Wt 160 lb 6.4 oz (72.8 kg)   SpO2 100%   BMI 21.75 kg/m   Wt Readings from Last 3 Encounters:  05/17/19 160 lb 6.4 oz (72.8 kg)  04/29/19 160 lb (72.6 kg)  02/06/19 160 lb (72.6 kg)    Physical Exam Vitals and nursing note reviewed.  Constitutional:      General: He is not in acute distress.    Appearance: He is well-developed. He is not diaphoretic.     Comments: Well-appearing, comfortable,  cooperative  HENT:     Head: Normocephalic and atraumatic.  Eyes:     General:        Right eye: No discharge.        Left eye: No discharge.     Conjunctiva/sclera: Conjunctivae normal.  Neck:     Thyroid: No thyromegaly.  Cardiovascular:     Rate and Rhythm: Normal rate and regular rhythm.     Heart sounds: Normal heart sounds. No murmur.  Pulmonary:     Effort: Pulmonary effort is normal. No respiratory distress.     Breath sounds: Normal breath sounds. No wheezing or rales.  Musculoskeletal:        General: Normal range of motion.     Cervical back: Normal range of motion and neck supple.     Comments: Lower extremity muscles appear mild atrophy muscle bulk  loss. Full strength intact ROM intact  Lymphadenopathy:     Cervical: No cervical adenopathy.  Skin:    General: Skin is warm and dry.     Findings: No erythema or rash.  Neurological:     Mental Status: He is alert and oriented to person, place, and time.  Psychiatric:        Behavior: Behavior normal.     Comments: Well groomed, good eye contact, normal speech and thoughts     Recent Labs    05/13/19 0852  HGBA1C 5.7*     Results for orders placed or performed in visit on 05/13/19  [facility] - CBC with Differential/Platelet physical  Result Value Ref Range   WBC 6.0 3.8 - 10.8 Thousand/uL   RBC 4.66 4.20 - 5.80 Million/uL   Hemoglobin 13.2 13.2 - 17.1 g/dL   HCT 40.3 38.5 - 50.0 %   MCV 86.5 80.0 - 100.0 fL   MCH 28.3 27.0 - 33.0 pg   MCHC 32.8 32.0 - 36.0 g/dL   RDW 12.8 11.0 - 15.0 %   Platelets 235 140 - 400 Thousand/uL   MPV 11.3 7.5 - 12.5 fL   Neutro Abs 4,272 1,500 - 7,800 cells/uL   Lymphs Abs 1,038 850 - 3,900 cells/uL   Absolute Monocytes 552 200 - 950 cells/uL   Eosinophils Absolute 120 15 - 500 cells/uL   Basophils Absolute 18 0 - 200 cells/uL   Neutrophils Relative % 71.2 %   Total Lymphocyte 17.3 %   Monocytes Relative 9.2 %   Eosinophils Relative 2.0 %   Basophils Relative 0.3 %  [facility] - Lipid panel physical  Result Value Ref Range   Cholesterol 128 <200 mg/dL   HDL 41 > OR = 40 mg/dL   Triglycerides 66 <150 mg/dL   LDL Cholesterol (Calc) 72 mg/dL (calc)   Total CHOL/HDL Ratio 3.1 <5.0 (calc)   Non-HDL Cholesterol (Calc) 87 <130 mg/dL (calc)  [facility] - CMET w/ GFR CMP Complete Metabolic Panel physical  Result Value Ref Range   Glucose, Bld 125 (H) 65 - 99 mg/dL   BUN 22 7 - 25 mg/dL   Creat 1.31 (H) 0.70 - 1.11 mg/dL   GFR, Est Non African American 50 (L) > OR = 60 mL/min/1.37m2   GFR, Est African American 58 (L) > OR = 60 mL/min/1.68m2   BUN/Creatinine Ratio 17 6 - 22 (calc)   Sodium 140 135 - 146 mmol/L   Potassium 4.3 3.5 - 5.3 mmol/L   Chloride  105 98 - 110 mmol/L   CO2 27 20 - 32 mmol/L   Calcium 9.6 8.6 -  10.3 mg/dL   Total Protein 6.8 6.1 - 8.1 g/dL   Albumin 4.3 3.6 - 5.1 g/dL   Globulin 2.5 1.9 - 3.7 g/dL (calc)   AG Ratio 1.7 1.0 - 2.5 (calc)   Total Bilirubin 1.0 0.2 - 1.2 mg/dL   Alkaline phosphatase (APISO) 76 35 - 144 U/L   AST 13 10 - 35 U/L   ALT 10 9 - 46 U/L  [facility] - A1c LAB Hemoglobin A1C physical  Result Value Ref Range   Hgb A1c MFr Bld 5.7 (H) <5.7 % of total Hgb   Mean Plasma Glucose 117 (calc)   eAG (mmol/L) 6.5 (calc)      Assessment & Plan:   Problem List Items Addressed This Visit    Primary osteoarthritis involving multiple joints   Pre-diabetes - Primary    Stable well-controlled Pre-DM with A1c 5.7 Concern with HTN, HLD, CAD  Plan:  1. Not on any therapy currently - remain off 2. Encourage improved lifestyle - low carb, low sugar diet, reduce portion size, continue improving regular exercise 3. Follow-up 6 months labs      Paroxysmal atrial fibrillation (HCC)    Followed by Dr Rockey Situ [facility] Cardiology      Mild dementia [facility])    Stable chronic problem with mild memory loss No significant behavioral concerns Not on medication Reassurance Follow-up, if worsening consider repeat screening and evaluation      Benign hypertension with CKD (chronic kidney disease) stage III    Well-controlled HTN Complication with CKD III stable compared to results 2008    Plan:  1. Continue current BP regimen  - Amlodipine 5mg  daily 2. Encourage improved lifestyle - ow sodium diet, regular exercise 3. Continue monitor BP outside office, bring readings to next visit, if persistently >140/90 or new symptoms notify office sooner       Other Visit Diagnoses    Aortic atherosclerosis (HCC)   (Chronic)        Arthritis / Back Pain Leg weakness Unlikely statin induced based on history seems to be doing well on current statin Encourage Tylenol, baclofen low dose half pill, caution sedation May fill  meloxicam if able use rarely PRN arthritis back pain flare up Encourage using more tension higher weight training for leg exercises fewer reps to build muscle Appropriate protein nutrition Future consider PT  No orders of the defined types were placed in this encounter.   Follow up plan: Return in about 6 months (around 11/17/2019) for Annual Physical.  Future labs ordered for 12/2019  [name], [facility] 05/17/2019, 1:33 PM

## 2019-05-17 NOTE — Assessment & Plan Note (Signed)
Stable chronic problem with mild memory loss No significant behavioral concerns Not on medication Reassurance Follow-up, if worsening consider repeat screening and evaluation

## 2019-05-17 NOTE — Assessment & Plan Note (Signed)
Followed by Dr Rockey Situ [facility] Cardiology

## 2019-05-17 NOTE — Patient Instructions (Addendum)
Thank you for coming to the office today.  Send a Therapist, music with dates of [place] vaccine, need 1st and 2nd date and we can update chart.  Take half tab Baclofen as needed for back pain. Can try the meloxicam if needed in future.  Increase tension and decrease reps, for leg strengthening machine exercise  Kidney function is stable overall, similar to 2008. Stay well hydrated.  Keep track of BP  Recent Labs    05/13/19 0852  HGBA1C 5.7*     Please schedule a Follow-up Appointment to: Return in about 6 months (around 11/17/2019) for Annual Physical.  If you have any other questions or concerns, please feel free to call the office or send a message through [place]. You may also schedule an earlier appointment if necessary.  Additionally, you may be receiving a survey about your experience at our office within a few days to 1 week by e-mail or mail. We value your feedback.  [name], DO [place]

## 2019-05-17 NOTE — Assessment & Plan Note (Signed)
Stable well-controlled Pre-DM with A1c 5.7 Concern with HTN, HLD, CAD  Plan:  1. Not on any therapy currently - remain off 2. Encourage improved lifestyle - low carb, low sugar diet, reduce portion size, continue improving regular exercise 3. Follow-up 6 months labs

## 2019-05-17 NOTE — Assessment & Plan Note (Addendum)
Well-controlled HTN Complication with CKD III stable compared to results 2008    Plan:  1. Continue current BP regimen  - Amlodipine 5mg  daily 2. Encourage improved lifestyle - ow sodium diet, regular exercise 3. Continue monitor BP outside office, bring readings to next visit, if persistently >140/90 or new symptoms notify office sooner

## 2019-05-28 DIAGNOSIS — M47816 Spondylosis without myelopathy or radiculopathy, lumbar region: Secondary | ICD-10-CM

## 2019-05-30 DIAGNOSIS — M47816 Spondylosis without myelopathy or radiculopathy, lumbar region: Secondary | ICD-10-CM | POA: Insufficient documentation

## 2019-06-18 NOTE — Progress Notes (Signed)
Date:  06/19/2019   ID:  [name], DOB 09/10/1932, MRN [id number]  Patient Location:  [address] [place] [address]   Provider location:   [facility], [place] office  PCP:  [name], DO  Cardiologist:  Arvid Right [facility]   Chief Complaint  Patient presents with  . Other    6 monht follow up. Patient can hear his pulse in RIGHT ear. Meds reviewed verbally with patient.     History of Present Illness:    [name] is a 86 y.o. male past medical history of coronary artery disease, bypass surgery may 2014  atrial fibrillation, previously on amiodarone, s/p maze stent placed to his LAD in 1993, stent placed to his mid RCA in 2003,  in-stent restenosis with repeat stent placed to his LAD in September 08,  stent placed to his RCA at the same time,  stress test in February 2009 and Jan 2014 which showed no ischemia,  low testosterone  Constipation Back pain He presents for routine followup of his coronary artery disease  Ran out of simvastatin, did not realize it Cholesterol jumped 174 from 113 On zetia Back on the medications, recheck of his numbers as below Total chol 128, LDL 72 HBA1C 5.7 CR 1.3  Recent emergency room January 2021, had back pain Scan chest abdomen pelvis relatively benign apart from moderate stool burden, diverticulosis  Weakness in the legs, moves slowly No regular exercise program  Biggest issue is chronic constipation which is making him feel miserable Wife getting on him to drink more water, creatinine up to 1.3 He has tried numerous over-the-counter agents including MiraLAX and other suppositories etc. with no relief It has started to dominate his day, makes him miserable  Denies any symptoms of chest pain concerning for angina  EKG personally reviewed by myself on todays visit Shows normal sinus rhythm rate 81 bpm no significant ST-T wave changes   Other past medical history Previous episode  of dizziness in 2015 dizziness. He was unable to get out of bed without assistance. Dizziness occurred while supine as well as sitting and standing. He went to the emergency room, was told that he could be dehydrated and was given IV fluids x2 bags. This seemed to improve his symptoms and was discharged home. Other workup in the hospital was negative including negative cardiac enzymes, normal basic metabolic panel, normal LFTs, normal EKG  Previously had chest pain in may 2014. Presented to [facility], cardiac catheterization showing subtotal occlusion of his LAD stent, in-stent restenosis, severe OM1 and OM 2 disease also with RCA disease, On 05/18/2012. He underwent CABG x 4 utilizing LIMA to LAD, SVG to OM1, SVG to OM2, and SVG to PDA. He also underwent Complete MAZE procedure with Clipping of Left Atrial Appendage.   He did not tolerate beta blockers or ACE inhibitor he had malaise, bradycardia. He took himself off these medications.   In January 2014 he had an episode of atrial fibrillation and presented to the emergency room. Converted back to normal sinus rhythm. Discharged on pradaxa. Recurrent episodes of atrial fibrillation shortly after that.   Prior CV studies:   The following studies were reviewed today:    Past Medical History:  Diagnosis Date  . Arthritis   . Coronary artery disease    a. 1993 s/p PCI/BMS LAD;  b. 2003 PCI RCA;  c. 09/2006 ISR LAD->PCI, PCI RCA;  d. 02/2007 MV: no ischemia;  d. 01/2012  MV: EF 74%, no ischemia.  [name] History of prostate cancer   . Hyperlipidemia   . Hyperthyroidism   . Low testosterone   . PAF (paroxysmal atrial fibrillation) ([name])    a. previously on amio ->stopped in 2013;  b. recurrent PAF 01/2012->Pradaxa and amio initiated, subsequently came off of pradaxa 2/2 cost;  c. 02/2012 Echo: EF 55-60%, Gr 1 DD.  [name] Prostate cancer ([place])   . S/P CABG x 4    Past Surgical History:  Procedure Laterality Date  . APPENDECTOMY     Dr. Bary Castilla  .  CARDIAC CATHETERIZATION  2008  . CLIPPING OF ATRIAL APPENDAGE Left 05/18/2012   Procedure: CLIPPING OF ATRIAL APPENDAGE;  Surgeon: [name], MD;  Location: [place];  Service: Open Heart Surgery;  Laterality: Left;  . COLONOSCOPY  2013  . COLONOSCOPY WITH PROPOFOL N/A 07/12/2016   Procedure: COLONOSCOPY WITH PROPOFOL;  Surgeon: [name], MD;  [facility] ENDOSCOPY;  Service: Endoscopy;  Laterality: N/A;  . CORONARY ARTERY BYPASS GRAFT N/A 05/18/2012   Procedure: CORONARY ARTERY BYPASS GRAFTING (CABG);  Surgeon: [name], MD;  Location: [place];  Service: Open Heart Surgery;  Laterality: N/A;  Times 4 using left internal mammary artery and endoscopically harvested right saphenous vein  . ESOPHAGOGASTRODUODENOSCOPY (EGD) WITH PROPOFOL N/A 10/24/2017   Procedure: ESOPHAGOGASTRODUODENOSCOPY (EGD) WITH PROPOFOL;  Surgeon: [name], MD;  Location: [facility] ENDOSCOPY;  Service: Endoscopy;  Laterality: N/A;  . HERNIA REPAIR     s/p mesh bilaterally, Dr. Bary Castilla  . INTRAOPERATIVE TRANSESOPHAGEAL ECHOCARDIOGRAM N/A 05/18/2012   Procedure: INTRAOPERATIVE TRANSESOPHAGEAL ECHOCARDIOGRAM;  Surgeon: [name], MD;  Location: [place];  Service: Open Heart Surgery;  Laterality: N/A;  . IR ESOPHAGUS DILITATION RETRO FLUORO    . LEFT HEART CATHETERIZATION WITH CORONARY ANGIOGRAM Bilateral 05/16/2012   Procedure: LEFT HEART CATHETERIZATION WITH CORONARY ANGIOGRAM;  Surgeon: Burnell Blanks, MD;  Location: [facility] CATH LAB;  Service: Cardiovascular;  Laterality: Bilateral;  . MAZE N/A 05/18/2012   Procedure: MAZE;  Surgeon: [name], MD;  Location: [place];  Service: Open Heart Surgery;  Laterality: N/A;  . MOHS SURGERY    . PROSTATECTOMY    . TOTAL HIP ARTHROPLASTY Right    Dr. [name]     Allergies:   Ambien [zolpidem tartrate], Pravastatin sodium, Rosuvastatin calcium, Statins, and Zolpidem tartrate   Social History   Tobacco Use  . Smoking status: Former Smoker    Types: Cigars    Quit  date: 10/04/1988    Years since quitting: 30.7  . Smokeless tobacco: Former Systems developer    Types: Chew    Quit date: 09/04/1991  Substance Use Topics  . Alcohol use: Yes    Alcohol/week: 7.0 standard drinks    Types: 7 Glasses of wine per week    Comment: 1/3 cup a day of red wine   . Drug use: No     Current Outpatient Medications on File Prior to Visit  Medication Sig Dispense Refill  . Acetaminophen (TYLENOL ARTHRITIS PAIN PO) Take by mouth as needed.    [name] amLODipine (NORVASC) 5 MG tablet Take 1 tablet (5 mg total) by mouth daily. 90 tablet 1  . aspirin EC 81 MG tablet Take 81 mg by mouth every evening.    . baclofen (LIORESAL) 10 MG tablet Take 0.5-1 tablets (5-10 mg total) by mouth 3 (three) times daily as needed for muscle spasms. 30 each 3  . bisacodyl (DULCOLAX) 10 MG suppository Place 10 mg  rectally daily as needed for moderate constipation.    . Cholecalciferol (VITAMIN D3) 1.25 MG (50000 UT) CAPS Take by mouth daily.    [name] ezetimibe (ZETIA) 10 MG tablet Take 1 tablet (10 mg total) by mouth daily. 90 tablet 3  . fluticasone (FLONASE) 50 MCG/ACT nasal spray Place 2 sprays into both nostrils daily. Use for 4-6 weeks then stop and use seasonally or as needed. 16 g 3  . hydrocortisone 2.5 % cream     . latanoprost (XALATAN) 0.005 % ophthalmic solution 0.005 drops daily.    [name] lidocaine (LIDODERM) 5 % Place 1 patch onto the skin daily. Remove & Discard patch within 12 hours or as directed by MD 30 patch 0  . meloxicam (MOBIC) 15 MG tablet Take 1 tablet (15 mg total) by mouth daily as needed for pain. 90 tablet 1  . pantoprazole (PROTONIX) 40 MG tablet Take 1 tablet (40 mg total) by mouth daily before breakfast. 90 tablet 3  . polyethylene glycol (MIRALAX / GLYCOLAX) packet Take 17 g by mouth daily as needed.    . simvastatin (ZOCOR) 20 MG tablet Take 1 tablet (20 mg total) by mouth at bedtime. 90 tablet 3  . testosterone cypionate (DEPOTESTOSTERONE CYPIONATE) 200 MG/ML injection INJECT 0.3  MLS (60 MG TOTAL) INTO THE MUSCLE EVERY 14 DAYS 10 mL 0  . timolol (TIMOPTIC) 0.5 % ophthalmic solution Place 1 drop into both eyes daily.      No current facility-administered medications on file prior to visit.     Family Hx: The patient's family history includes Coronary artery disease in his mother and sister; Diabetes in his father and sister; Heart disease in his brother and mother; Heart disease (age of onset: 68) in his sister.  ROS:   Please see the history of present illness.    Review of Systems  Constitutional: Negative.   HENT: Negative.   Respiratory: Negative.   Cardiovascular: Negative.   Gastrointestinal: Negative.   Musculoskeletal: Negative.   Neurological: Negative.   Psychiatric/Behavioral: Negative.   All other systems reviewed and are negative.    Labs/Other Tests and Data Reviewed:    Recent Labs: 05/13/2019: ALT 10; BUN 22; Creat 1.31; Hemoglobin 13.2; Platelets 235; Potassium 4.3; Sodium 140   Recent Lipid Panel Lab Results  Component Value Date/Time   CHOL 128 05/13/2019 08:52 AM   CHOL 138 05/23/2016 10:01 AM   CHOL 123 05/14/2012 12:28 AM   TRIG 66 05/13/2019 08:52 AM   TRIG 53 05/14/2012 12:28 AM   HDL 41 05/13/2019 08:52 AM   HDL 38 (L) 05/23/2016 10:01 AM   HDL 44 05/14/2012 12:28 AM   CHOLHDL 3.1 05/13/2019 08:52 AM   LDLCALC 72 05/13/2019 08:52 AM   LDLCALC 68 05/14/2012 12:28 AM    Wt Readings from Last 3 Encounters:  06/19/19 159 lb (72.1 kg)  05/17/19 160 lb 6.4 oz (72.8 kg)  04/29/19 160 lb (72.6 kg)     Exam:    Vital Signs: Vital signs may also be detailed in the HPI BP 136/70 (BP Location: Left Arm, Patient Position: Sitting, Cuff Size: Normal)   Pulse 81   Ht 6\' 1"  (1.854 m)   Wt 159 lb (72.1 kg)   SpO2 98%   BMI 20.98 kg/m   Constitutional:  oriented to person, place, and time. No distress.  HENT:  Head: Grossly normal Eyes:  no discharge. No scleral icterus.  Neck: No JVD, no carotid bruits  Cardiovascular:  Regular rate and  rhythm, no murmurs appreciated Pulmonary/Chest: Clear to auscultation bilaterally, no wheezes or rails Abdominal: Soft.  no distension.  no tenderness.  Musculoskeletal: Normal range of motion Neurological:  normal muscle tone. Coordination normal. No atrophy Skin: Skin warm and dry Psychiatric: normal affect, pleasant   ASSESSMENT & PLAN:    Problem List Items Addressed This Visit      Cardiology Problems   Paroxysmal atrial fibrillation ([place])   Relevant Orders   EKG 12-Lead   Hyperlipidemia   AAA (abdominal aortic aneurysm) without rupture ([place])    Other Visit Diagnoses    Atherosclerosis of native coronary artery of native heart with stable angina pectoris ([place])    -  Primary   Relevant Orders   EKG 12-Lead   Aortic atherosclerosis (HCC)       HYPERTENSION, BENIGN       S/P CABG x 4         CAD with stable angina No chest pain, Cholesterol back at goal range, No further testing ordered at this time  Atrial fibrillation S/p maze Long history maintaining normal sinus rhythm Currently not on anticoagulation Again on today's visit normal sinus rhythm  Hypertension I had high blood pressure in the emergency room January 2021 in the setting of abdominal pain started on amlodipine at discharge Does not check blood pressure at home I am inclined to keep the amlodipine in place given blood pressure is well controlled  Constipation, chronic Having severe symptoms, recommend he talk with primary care, may be candidate for Linzess or other agents Would certainly increase his fluid intake which might help his symptoms to some degree   Disposition: Follow-up in 6 months   Signed, Ida Rogue, MD  [facility] [address], [place], [place] [address]

## 2019-06-19 ENCOUNTER — Encounter: Payer: Self-pay | Admitting: Cardiovascular Disease

## 2019-06-19 ENCOUNTER — Ambulatory Visit: Payer: PPO | Admitting: Cardiovascular Disease

## 2019-06-19 ENCOUNTER — Other Ambulatory Visit: Payer: Self-pay

## 2019-06-19 VITALS — BP 136/70 | HR 81 | Ht 73.0 in | Wt 159.0 lb

## 2019-06-19 DIAGNOSIS — I700 Atherosclerosis of aorta: Secondary | ICD-10-CM

## 2019-06-19 DIAGNOSIS — I480 Paroxysmal atrial fibrillation: Secondary | ICD-10-CM

## 2019-06-19 DIAGNOSIS — Z951 Presence of aortocoronary bypass graft: Secondary | ICD-10-CM | POA: Diagnosis not present

## 2019-06-19 DIAGNOSIS — I25118 Atherosclerotic heart disease of native coronary artery with other forms of angina pectoris: Secondary | ICD-10-CM | POA: Diagnosis not present

## 2019-06-19 DIAGNOSIS — E782 Mixed hyperlipidemia: Secondary | ICD-10-CM | POA: Diagnosis not present

## 2019-06-19 DIAGNOSIS — I714 Abdominal aortic aneurysm, without rupture, unspecified: Secondary | ICD-10-CM

## 2019-06-19 DIAGNOSIS — I10 Essential (primary) hypertension: Secondary | ICD-10-CM

## 2019-06-19 NOTE — Patient Instructions (Addendum)
Medication Instructions:  No changes  I will ask Dr. Raliegh Ip about Linzess  If you need a refill on your cardiac medications before your next appointment, please call your pharmacy.    Lab work: No new labs needed   If you have labs (blood work) drawn today and your tests are completely normal, you will receive your results only by: [name] MyChart Message (if you have MyChart) OR . A paper copy in the mail If you have any lab test that is abnormal or we need to change your treatment, we will call you to review the results.   Testing/Procedures: No new testing needed   Follow-Up: At [facility], you and your health needs are our priority.  As part of our continuing mission to provide you with exceptional heart care, we have created designated Provider Care Teams.  These Care Teams include your primary Cardiologist (physician) and Advanced Practice Providers (APPs -  Physician Assistants and Nurse Practitioners) who all work together to provide you with the care you need, when you need it.  . You will need a follow up appointment in 6 months   . Providers on your designated Care Team:   . Murray Hodgkins, NP . Christell Faith, PA-C . Marrianne Mood, PA-C  Any Other Special Instructions Will Be Listed Below (If Applicable).  For educational health videos Log in to : [email or web address] Or : [email or web address], password : [email or web address]

## 2019-06-20 ENCOUNTER — Ambulatory Visit (INDEPENDENT_AMBULATORY_CARE_PROVIDER_SITE_OTHER): Payer: PPO | Admitting: Family Medicine

## 2019-06-20 ENCOUNTER — Other Ambulatory Visit: Payer: Self-pay

## 2019-06-20 ENCOUNTER — Encounter: Payer: Self-pay | Admitting: Family Medicine

## 2019-06-20 DIAGNOSIS — K5900 Constipation, unspecified: Secondary | ICD-10-CM

## 2019-06-20 MED ORDER — SORBITOL 70 % SOLN: 30.0000 mL | Freq: Every day | 1 refills | Status: DC | PRN

## 2019-06-20 MED ORDER — LINACLOTIDE 145 MCG PO CAPS: 145.0000 ug | ORAL_CAPSULE | Freq: Every day | ORAL | 1 refills | Status: DC

## 2019-06-20 NOTE — Patient Instructions (Signed)
As we discussed, can take the Sorbitol 30-10mL daily as needed for constipation.  Will begin you on Linzess 180mcg daily, to be taken before breakfast, to help with chronic constipation.  Be sure to drink enough water daily to help with bowel movements and to prevent dehydration.  If you begin to have diarrhea, be sure to eat a BRAT diet to help with fluid retention and ensuring you do not become dehydrated.  We will plan to see you back in 3-4 weeks for constipation follow up  You will receive a survey after today's visit either digitally by e-mail or paper by [place] mail. Your experiences and feedback matter to [place].  Please respond so we know how we are doing as we provide care for you.  Call us with any questions/concerns/needs.  It is my goal to be available to you for your health concerns.  Thanks for choosing me to be a partner in your healthcare needs!  Harlin Rain, FNP-C Family Nurse Practitioner [facility] Phone: [phone]

## 2019-06-20 NOTE — Assessment & Plan Note (Signed)
Current constipation poorly controlled with dulcolax 2x per week and mirilax prn.  Requesting medication for a bowel movement now and then a daily medication to help with having bowel movements.  Discussed options such as mirilax clean out vs. Sorbitol for immediate relief and linzess vs daily mirilax.  Patient interested in sorbitol now for immediate relief and linzess for daily treatment.  Educated on diarrhea and to begin a [place] diet if the medications are too strong on his stomach, to prevent dehydration.  Patient verbalized understanding and denied any additional questions/concerns.  Plan: 1. Sorbitol 30-60mg  daily as needed for constipation 2. Linzess 195mcg capsule daily before breakfast 3. Increase water intake 4. Follow up with Dr. Parks Ranger in 3-4 weeks

## 2019-06-20 NOTE — Progress Notes (Signed)
Virtual Visit via Telephone  The purpose of this virtual visit is to provide medical care while limiting exposure to the novel coronavirus (COVID19) for both patient and office staff.  Consent was obtained for phone visit:  Yes.   Answered questions that patient had about telehealth interaction:  Yes.   I discussed the limitations, risks, security and privacy concerns of performing an evaluation and management service by telephone. I also discussed with the patient that there may be a patient responsible charge related to this service. The patient expressed understanding and agreed to proceed.  Patient is at home and is accessed via telephone Services are provided by Harlin Rain, FNP-C from [facility])  ---------------------------------------------------------------------- Chief Complaint  Patient presents with  . Constipation    constipation, irregular bowel movement. He said his currently miralex, dulcolax as needed, but still recurrent constipation. The pt contributes his constipation to long term medication use    S: Reviewed CMA documentation. I have called patient and gathered additional HPI as follows:  Mr. Fennell presents for telemedicine visit for chronic idiopathic constipation.  Reports that he has had a long history of constipation, meeting with [facility] in 2018 and 2019, last colonoscopy in 2018, and has been taking dulcolax and mirilax as needed for bowel movements.  States that he is looking for something that he can take that will make his bowel movements regular and help with clearing up his current constipation.  Has not taken any daily medication for this in the past.  Denies any abdominal pain, n/v/d, blood on stool or toilet tissue, dizziness, or lightheadedness.  Patient is currently home Denies any high risk travel to areas of current concern for COVID19. Denies any known or suspected exposure to person with or possibly  with COVID19.  Denies any fevers, chills, sweats, body ache, cough, shortness of breath, sinus pain or pressure, headache, abdominal pain, diarrhea  Past Medical History:  Diagnosis Date  . Arthritis   . Coronary artery disease    a. 1993 s/p PCI/BMS LAD;  b. 2003 PCI RCA;  c. 09/2006 ISR LAD->PCI, PCI RCA;  d. 02/2007 MV: no ischemia;  d. 01/2012 MV: EF 74%, no ischemia.  [name] History of prostate cancer   . Hyperlipidemia   . Hyperthyroidism   . Low testosterone   . PAF (paroxysmal atrial fibrillation) ([place])    a. previously on amio ->stopped in 2013;  b. recurrent PAF 01/2012->Pradaxa and amio initiated, subsequently came off of pradaxa 2/2 cost;  c. 02/2012 Echo: EF 55-60%, Gr 1 DD.  [name] Prostate cancer ([place])   . S/P CABG x 4    Social History   Tobacco Use  . Smoking status: Former Smoker    Types: Cigars    Quit date: 10/04/1988    Years since quitting: 30.7  . Smokeless tobacco: Former Systems developer    Types: [place] date: 09/04/1991  Vaping Use  . Vaping Use: Never used  Substance Use Topics  . Alcohol use: Yes    Alcohol/week: 7.0 standard drinks    Types: 7 Glasses of wine per week    Comment: 1/3 cup a day of red wine   . Drug use: No    Current Outpatient Medications:  .  Acetaminophen (TYLENOL ARTHRITIS PAIN PO), Take by mouth as needed., Disp: , Rfl:  .  amLODipine (NORVASC) 5 MG tablet, Take 1 tablet (5 mg total) by mouth daily., Disp: 90 tablet, Rfl: 1 .  aspirin EC 81 MG tablet, Take 81 mg by mouth every evening., Disp: , Rfl:  .  baclofen (LIORESAL) 10 MG tablet, Take 0.5-1 tablets (5-10 mg total) by mouth 3 (three) times daily as needed for muscle spasms., Disp: 30 each, Rfl: 3 .  bisacodyl (DULCOLAX) 10 MG suppository, Place 10 mg rectally daily as needed for moderate constipation., Disp: , Rfl:  .  Cholecalciferol (VITAMIN D3) 1.25 MG (50000 UT) CAPS, Take by mouth daily., Disp: , Rfl:  .  ezetimibe (ZETIA) 10 MG tablet, Take 1 tablet (10 mg total) by mouth daily.,  Disp: 90 tablet, Rfl: 3 .  fluticasone (FLONASE) 50 MCG/ACT nasal spray, Place 2 sprays into both nostrils daily. Use for 4-6 weeks then stop and use seasonally or as needed., Disp: 16 g, Rfl: 3 .  latanoprost (XALATAN) 0.005 % ophthalmic solution, 0.005 drops daily., Disp: , Rfl:  .  pantoprazole (PROTONIX) 40 MG tablet, Take 1 tablet (40 mg total) by mouth daily before breakfast., Disp: 90 tablet, Rfl: 3 .  polyethylene glycol (MIRALAX / GLYCOLAX) packet, Take 17 g by mouth daily as needed., Disp: , Rfl:  .  simvastatin (ZOCOR) 20 MG tablet, Take 1 tablet (20 mg total) by mouth at bedtime., Disp: 90 tablet, Rfl: 3 .  testosterone cypionate (DEPOTESTOSTERONE CYPIONATE) 200 MG/ML injection, INJECT 0.3 MLS (60 MG TOTAL) INTO THE MUSCLE EVERY 14 DAYS, Disp: 10 mL, Rfl: 0 .  timolol (TIMOPTIC) 0.5 % ophthalmic solution, Place 1 drop into both eyes daily. , Disp: , Rfl:  .  hydrocortisone 2.5 % cream, , Disp: , Rfl:  .  lidocaine (LIDODERM) 5 %, Place 1 patch onto the skin daily. Remove & Discard patch within 12 hours or as directed by MD (Patient not taking: Reported on 06/20/2019), Disp: 30 patch, Rfl: 0 .  linaclotide (LINZESS) 145 MCG CAPS capsule, Take 1 capsule (145 mcg total) by mouth daily before breakfast., Disp: 90 capsule, Rfl: 1 .  sorbitol 70 % SOLN, Take 30-60 mLs by mouth daily as needed for moderate constipation., Disp: 473 mL, Rfl: 1  Depression screen [facility] 2/9 05/17/2019 12/25/2018 11/16/2018  Decreased Interest 0 0 0  Down, Depressed, Hopeless 0 0 0  PHQ - 2 Score 0 0 0  Difficult doing work/chores - - -    No flowsheet data found.  -------------------------------------------------------------------------- O: No physical exam performed due to remote telephone encounter.  Physical Exam: Patient remotely monitored without video.  Verbal communication appropriate.  Cognition normal.  Recent Results (from the past 2160 hour(s))  Testosterone     Status: None   Collection Time:  04/29/19  2:47 PM  Result Value Ref Range   Testosterone 693 264 - 916 ng/dL    Comment: Adult male reference interval is based on a population of healthy nonobese males (BMI <30) between 68 and 48 years old. [place], [place] [phone]. PMID: [id number].      **Effective May 20, 2019 Testosterone, Serum**        reference interval will be changing to:              Age                Male          Male           0 - 30 days          0 - 650        4 - 190  1 -  5 months        0 - 650        0 -  42                6 months        0 -  36        0 -  42           47m-  1 year          0 -  36        1 -  26           2 -  5 years         0 -  36        3 -  33           6 -  8 years         0 -  36        3 -  25           9 - 10 years         0 -  21        1 -  33               11 years         1 - 161        5 -  58               12 years         2 - 521        5 -  19          13 - 15 years        37 - 656       12 -  71          16 - 17 years       150 - 785       12 -  71          18 - 19 years       150 - 785       13 -  71           20 - 30 years       264 - 916       13 -  71          31 - 40 years       264 - 916        8 -  60          41 - 60 years       264 - 916        4 -  50          61 - 80 years       264 - 916        3 -  67              >80 years       264 - 916        2 -  45   PSA     Status: None   Collection Time: 04/29/19  2:47 PM  Result Value Ref Range   Prostate Specific Ag, Serum <0.1 0.0 - 4.0 ng/mL    Comment: Roche ECLIA methodology. According to the American  Urological Association, Serum PSA should decrease and remain at undetectable levels after radical prostatectomy. The AUA defines biochemical recurrence as an initial PSA value 0.2 ng/mL or greater followed by a subsequent confirmatory PSA value 0.2 ng/mL or greater. Values obtained with different assay methods or kits cannot be used interchangeably. Results cannot be  interpreted as absolute evidence of the presence or absence of malignant disease.   Hematocrit     Status: None   Collection Time: 04/29/19  2:47 PM  Result Value Ref Range   Hematocrit 40.1 37.5 - 51.0 %  [facility] - CBC with Differential/Platelet physical     Status: None   Collection Time: 05/13/19  8:52 AM  Result Value Ref Range   WBC 6.0 3.8 - 10.8 Thousand/uL   RBC 4.66 4.20 - 5.80 Million/uL   Hemoglobin 13.2 13.2 - 17.1 g/dL   HCT 40.3 38 - 50 %   MCV 86.5 80.0 - 100.0 fL   MCH 28.3 27.0 - 33.0 pg   MCHC 32.8 32.0 - 36.0 g/dL   RDW 12.8 11.0 - 15.0 %   Platelets 235 140 - 400 Thousand/uL   MPV 11.3 7.5 - 12.5 fL   Neutro Abs 4,272 1,500 - 7,800 cells/uL   Lymphs Abs 1,038 850 - 3,900 cells/uL   Absolute Monocytes 552 200 - 950 cells/uL   Eosinophils Absolute 120 15 - 500 cells/uL   Basophils Absolute 18 0 - 200 cells/uL   Neutrophils Relative % 71.2 %   Total Lymphocyte 17.3 %   Monocytes Relative 9.2 %   Eosinophils Relative 2.0 %   Basophils Relative 0.3 %  [facility] - Lipid panel physical     Status: None   Collection Time: 05/13/19  8:52 AM  Result Value Ref Range   Cholesterol 128 <200 mg/dL   HDL 41 > OR = 40 mg/dL   Triglycerides 66 <150 mg/dL   LDL Cholesterol (Calc) 72 mg/dL (calc)    Comment: Reference range: <100 . Desirable range <100 mg/dL for primary prevention;   <70 mg/dL for patients with CHD or diabetic patients  with > or = 2 CHD risk factors. [name] LDL-C is now calculated using the Martin-Hopkins  calculation, which is a validated novel method providing  better accuracy than the Friedewald equation in the  estimation of LDL-C.  Cresenciano Genre et al. Annamaria Helling. 2952;841(32): 2061-2068  (http://education.[employer].com/faq/FAQ164)    Total CHOL/HDL Ratio 3.1 <5.0 (calc)   Non-HDL Cholesterol (Calc) 87 <130 mg/dL (calc)    Comment: For patients with diabetes plus 1 major ASCVD risk  factor, treating to a non-HDL-C goal of <100 mg/dL  (LDL-C of <70 mg/dL)  is considered a therapeutic  option.   [facility] - CMET w/ GFR CMP Complete Metabolic Panel physical     Status: Abnormal   Collection Time: 05/13/19  8:52 AM  Result Value Ref Range   Glucose, Bld 125 (H) 65 - 99 mg/dL    Comment: .            Fasting reference interval . For someone without known diabetes, a glucose value between 100 and 125 mg/dL is consistent with prediabetes and should be confirmed with a follow-up test. .    BUN 22 7 - 25 mg/dL   Creat 1.31 (H) 0.70 - 1.11 mg/dL    Comment: For patients >87 years of age, the reference limit for Creatinine is approximately 13% higher for people identified as African-American. .    GFR, Est Non African American 50 (  L) > OR = 60 mL/min/1.72m2   GFR, Est African American 58 (L) > OR = 60 mL/min/1.71m2   BUN/Creatinine Ratio 17 6 - 22 (calc)   Sodium 140 135 - 146 mmol/L   Potassium 4.3 3.5 - 5.3 mmol/L   Chloride 105 98 - 110 mmol/L   CO2 27 20 - 32 mmol/L   Calcium 9.6 8.6 - 10.3 mg/dL   Total Protein 6.8 6.1 - 8.1 g/dL   Albumin 4.3 3.6 - 5.1 g/dL   Globulin 2.5 1.9 - 3.7 g/dL (calc)   AG Ratio 1.7 1.0 - 2.5 (calc)   Total Bilirubin 1.0 0.2 - 1.2 mg/dL   Alkaline phosphatase (APISO) 76 35 - 144 U/L   AST 13 10 - 35 U/L   ALT 10 9 - 46 U/L  [facility] - A1c LAB Hemoglobin A1C physical     Status: Abnormal   Collection Time: 05/13/19  8:52 AM  Result Value Ref Range   Hgb A1c MFr Bld 5.7 (H) <5.7 % of total Hgb    Comment: For someone without known diabetes, a hemoglobin  A1c value between 5.7% and 6.4% is consistent with prediabetes and should be confirmed with a  follow-up test. . For someone with known diabetes, a value <7% indicates that their diabetes is well controlled. A1c targets should be individualized based on duration of diabetes, age, comorbid conditions, and other considerations. . This assay result is consistent with an increased risk of diabetes. . Currently, no consensus exists regarding use  of hemoglobin A1c for diagnosis of diabetes for children. .    Mean Plasma Glucose 117 (calc)   eAG (mmol/L) 6.5 (calc)    -------------------------------------------------------------------------- A&P:  Problem List Items Addressed This Visit      Other   Constipation - Primary    Current constipation poorly controlled with dulcolax 2x per week and mirilax prn.  Requesting medication for a bowel movement now and then a daily medication to help with having bowel movements.  Discussed options such as mirilax clean out vs. Sorbitol for immediate relief and linzess vs daily mirilax.  Patient interested in sorbitol now for immediate relief and linzess for daily treatment.  Educated on diarrhea and to begin a [place] diet if the medications are too strong on his stomach, to prevent dehydration.  Patient verbalized understanding and denied any additional questions/concerns.  Plan: 1. Sorbitol 30-60mg  daily as needed for constipation 2. Linzess 15mcg capsule daily before breakfast 3. Increase water intake 4. Follow up with Dr. Parks Ranger in 3-4 weeks      Relevant Medications   sorbitol 70 % SOLN   linaclotide (LINZESS) 145 MCG CAPS capsule      Meds ordered this encounter  Medications  . sorbitol 70 % SOLN    Sig: Take 30-60 mLs by mouth daily as needed for moderate constipation.    Dispense:  473 mL    Refill:  1  . linaclotide (LINZESS) 145 MCG CAPS capsule    Sig: Take 1 capsule (145 mcg total) by mouth daily before breakfast.    Dispense:  90 capsule    Refill:  1    Follow-up: - Return in 3-4 weeks for follow up with Dr. Parks Ranger  Patient verbalizes understanding with the above medical recommendations including the limitation of remote medical advice.  Specific follow-up and call-back criteria were given for patient to follow-up or seek medical care more urgently if needed.  - Time spent in direct consultation with patient on phone: 9  minutes  Harlin Rain,  FNP-C [facility] 06/20/2019, 2:24 PM

## 2019-06-21 ENCOUNTER — Telehealth: Payer: Self-pay | Admitting: Family Medicine

## 2019-06-21 ENCOUNTER — Other Ambulatory Visit: Payer: Self-pay | Admitting: Family Medicine

## 2019-06-21 NOTE — Telephone Encounter (Signed)
Which medication was too expensive? (Sorbitol or Linzess)?   Patient can try a magnesium citrate if the sorbitol was too expensive or not in stock.  They are over the counter and he could drink 1/2 to a full bottle of this.    Should begin to have a bowel movement anywhere from 30 minutes to 6 hours from taking the medication.  Plan to stay home after he has taken this medication.    It can cause an increase in abdominal pain as it is stimulating the GI tract to move and produce a bowel movement.  If he has no relief with this medication, he may need a fleets enema (which is over the counter as well).

## 2019-06-21 NOTE — Telephone Encounter (Unsigned)
Copied from [place] [phone]. Topic: General - Other >> Jun 20, 2019  4:28 PM Celene Kras wrote: Reason for CRM: Pts wife called stating that the pharmacy was out of the constipation medication for pt. She is requesting to have coupons for the new medication that was sent in for pt as she states that it is too expensive. Please advise.

## 2019-06-21 NOTE — Telephone Encounter (Signed)
I called and spoke with the patient wife to clarify what medication was too expensive. She said she was not sure, because the pharmacy just left a message on the voicemail. She requested a coupon for the linzess. She said she know it would be expensive. I informed her about the recommendation to take the magnesium citrate. The patient wife declined the magnesium citrate at this time. She said he had a bm this morning and is doing okay right now. She is familiar with the magnesium citrate and state that he currently have a bottle of that along with almost every other OTC constipation medication. I told the wife I will give the pharmacy a call to clarify what prescription was out of stock. She verbalize understanding.   I called the patient wife back and informed her that her pharmacy currently have both medications in stock. I also informed her that the Linzess is $45.00. I informed her of the patient assistance Program that Linzess offer. She said that he will try the medication to see how it works and if it works well they will reach out to the patient assistance program.

## 2019-07-15 ENCOUNTER — Other Ambulatory Visit: Payer: Self-pay | Admitting: Dermatology

## 2019-07-18 ENCOUNTER — Ambulatory Visit (INDEPENDENT_AMBULATORY_CARE_PROVIDER_SITE_OTHER): Payer: PPO | Admitting: Family Medicine

## 2019-07-18 ENCOUNTER — Other Ambulatory Visit: Payer: Self-pay

## 2019-07-18 ENCOUNTER — Encounter: Payer: Self-pay | Admitting: Family Medicine

## 2019-07-18 VITALS — BP 113/58 | HR 61 | Temp 97.1°F | Resp 16 | Ht 73.0 in | Wt 157.6 lb

## 2019-07-18 DIAGNOSIS — K5900 Constipation, unspecified: Secondary | ICD-10-CM | POA: Diagnosis not present

## 2019-07-18 MED ORDER — POLYETHYLENE GLYCOL 3350 17 GM/SCOOP PO POWD: 17.0000 g | Freq: Every day | ORAL | 5 refills | Status: AC

## 2019-07-18 NOTE — Progress Notes (Signed)
Subjective:    Patient ID: [name], male    DOB: [birth year], 86 y.o.   MRN: [id number]  [name] is a 86 y.o. male presenting on 07/18/2019 for Constipation (was seen recently but still not improved)  Accompanied by wife, [name]  HPI   Chronic Constipation, follow-up - Last visit with our office [facility] 06/20/19, for initial visit for same problem with chronic constipation, treated with Linzess and Sorbitol, see prior notes for background information. - Interval update with he has stopped new meds after 1-2 weeks, only partial relief and then no relief, resumed his dulcolax and miralax, using 1 capful miralax daily some relief but still not resolving can go several days without BM no other pain or nausea or other GI problems, denies any blood in stool or dark stools, abdominal pain - In past used polyethylene glycol bottle, says the OTC miralax is not as effective for him - reduced PO intake overall with reduce appetite   Depression screen [facility] 2/9 05/17/2019 12/25/2018 11/16/2018  Decreased Interest 0 0 0  Down, Depressed, Hopeless 0 0 0  PHQ - 2 Score 0 0 0  Difficult doing work/chores - - -    Social History   Tobacco Use  . Smoking status: Former Smoker    Types: Cigars    Quit date: 10/04/1988    Years since quitting: 30.8  . Smokeless tobacco: Former Systems developer    Types: [place] date: 09/04/1991  Vaping Use  . Vaping Use: Never used  Substance Use Topics  . Alcohol use: Yes    Alcohol/week: 7.0 standard drinks    Types: 7 Glasses of wine per week    Comment: 1/3 cup a day of red wine   . Drug use: No    Review of Systems Per HPI unless specifically indicated above     Objective:    BP (!) 113/58   Pulse 61   Temp (!) 97.1 F (36.2 C) (Temporal)   Resp 16   Ht 6\' 1"  (1.854 m)   Wt 157 lb 9.6 oz (71.5 kg)   SpO2 100%   BMI 20.79 kg/m   Wt Readings from Last 3 Encounters:  07/18/19 157 lb 9.6 oz (71.5 kg)  06/19/19 159 lb (72.1 kg)  05/17/19 160 lb  6.4 oz (72.8 kg)    Physical Exam Vitals and nursing note reviewed.  Constitutional:      General: He is not in acute distress.    Appearance: He is well-developed. He is not diaphoretic.     Comments: Well-appearing, comfortable, cooperative  HENT:     Head: Normocephalic and atraumatic.  Eyes:     General:        Right eye: No discharge.        Left eye: No discharge.     Conjunctiva/sclera: Conjunctivae normal.  Cardiovascular:     Rate and Rhythm: Normal rate.  Pulmonary:     Effort: Pulmonary effort is normal.  Skin:    General: Skin is warm and dry.     Findings: No erythema or rash.  Neurological:     Mental Status: He is alert and oriented to person, place, and time.  Psychiatric:        Behavior: Behavior normal.     Comments: Well groomed, good eye contact, normal speech and thoughts    Results for orders placed or performed in visit on 05/13/19  [facility] - CBC with Differential/Platelet physical  Result  Value Ref Range   WBC 6.0 3.8 - 10.8 Thousand/uL   RBC 4.66 4.20 - 5.80 Million/uL   Hemoglobin 13.2 13.2 - 17.1 g/dL   HCT 40.3 38 - 50 %   MCV 86.5 80.0 - 100.0 fL   MCH 28.3 27.0 - 33.0 pg   MCHC 32.8 32.0 - 36.0 g/dL   RDW 12.8 11.0 - 15.0 %   Platelets 235 140 - 400 Thousand/uL   MPV 11.3 7.5 - 12.5 fL   Neutro Abs 4,272 1,500 - 7,800 cells/uL   Lymphs Abs 1,038 850 - 3,900 cells/uL   Absolute Monocytes 552 200 - 950 cells/uL   Eosinophils Absolute 120 15 - 500 cells/uL   Basophils Absolute 18 0 - 200 cells/uL   Neutrophils Relative % 71.2 %   Total Lymphocyte 17.3 %   Monocytes Relative 9.2 %   Eosinophils Relative 2.0 %   Basophils Relative 0.3 %  [facility] - Lipid panel physical  Result Value Ref Range   Cholesterol 128 <200 mg/dL   HDL 41 > OR = 40 mg/dL   Triglycerides 66 <150 mg/dL   LDL Cholesterol (Calc) 72 mg/dL (calc)   Total CHOL/HDL Ratio 3.1 <5.0 (calc)   Non-HDL Cholesterol (Calc) 87 <130 mg/dL (calc)  [facility] - CMET w/ GFR CMP Complete  Metabolic Panel physical  Result Value Ref Range   Glucose, Bld 125 (H) 65 - 99 mg/dL   BUN 22 7 - 25 mg/dL   Creat 1.31 (H) 0.70 - 1.11 mg/dL   GFR, Est Non African American 50 (L) > OR = 60 mL/min/1.36m2   GFR, Est African American 58 (L) > OR = 60 mL/min/1.81m2   BUN/Creatinine Ratio 17 6 - 22 (calc)   Sodium 140 135 - 146 mmol/L   Potassium 4.3 3.5 - 5.3 mmol/L   Chloride 105 98 - 110 mmol/L   CO2 27 20 - 32 mmol/L   Calcium 9.6 8.6 - 10.3 mg/dL   Total Protein 6.8 6.1 - 8.1 g/dL   Albumin 4.3 3.6 - 5.1 g/dL   Globulin 2.5 1.9 - 3.7 g/dL (calc)   AG Ratio 1.7 1.0 - 2.5 (calc)   Total Bilirubin 1.0 0.2 - 1.2 mg/dL   Alkaline phosphatase (APISO) 76 35 - 144 U/L   AST 13 10 - 35 U/L   ALT 10 9 - 46 U/L  [facility] - A1c LAB Hemoglobin A1C physical  Result Value Ref Range   Hgb A1c MFr Bld 5.7 (H) <5.7 % of total Hgb   Mean Plasma Glucose 117 (calc)   eAG (mmol/L) 6.5 (calc)      Assessment & Plan:   Problem List Items Addressed This Visit    Constipation - Primary   Relevant Medications   polyethylene glycol powder (GLYCOLAX/MIRALAX) 17 GM/SCOOP powder      Chronic functional constipation Reduced PO contributing Hydration/fiber factors as well Off Linzess, Sorbitol  Plan Stop Dulcolax START new rx generic polyethylene glycol powder 17-34g, use 1.5 cap preferred for daily dose maintenance, every day, can double or triple dose if acute constipation in future - Sent to [facility] - Follow-up if not improving, we can consider GI consultation in future if indicated  Meds ordered this encounter  Medications  . polyethylene glycol powder (GLYCOLAX/MIRALAX) 17 GM/SCOOP powder    Sig: Take 17-34 g by mouth daily. For constipation    Dispense:  765 g    Refill:  5      Follow up plan: Return if  symptoms worsen or fail to improve, for constipation.    [name], [facility] 07/18/2019, 10:39 AM

## 2019-07-18 NOTE — Patient Instructions (Addendum)
Thank you for coming to the office today.  Try the generic miralax - Polyethylene Glycol formula - 17 to 34 g per scoop, once a day, prefer 1.5 scoops daily for a maintenance dose. It is safe to double this from time to time if needed.  STOP Dulocolax at this time, as this is a duplicate.  Let me know if any other changes.   Please schedule a Follow-up Appointment to: Return if symptoms worsen or fail to improve, for constipation.  If you have any other questions or concerns, please feel free to call the office or send a message through [place]. You may also schedule an earlier appointment if necessary.  Additionally, you may be receiving a survey about your experience at our office within a few days to 1 week by e-mail or mail. We value your feedback.  [name], DO [place]

## 2019-07-19 DIAGNOSIS — H401133 Primary open-angle glaucoma, bilateral, severe stage: Secondary | ICD-10-CM | POA: Diagnosis not present

## 2019-08-06 ENCOUNTER — Telehealth: Payer: Self-pay | Admitting: Urology

## 2019-08-06 NOTE — Telephone Encounter (Signed)
Patient was just making sure if he used a bigger needle if that was okay. He states the pharmacy run out of the size he was using.

## 2019-08-06 NOTE — Telephone Encounter (Signed)
Patient would like for clinical to call him to discuss his testosterone injection that he gives himself. Pt. Not sure if he administered the shot correctly.

## 2019-08-30 ENCOUNTER — Other Ambulatory Visit: Payer: Self-pay | Admitting: Family Medicine

## 2019-08-30 DIAGNOSIS — I10 Essential (primary) hypertension: Secondary | ICD-10-CM

## 2019-10-01 ENCOUNTER — Other Ambulatory Visit: Payer: Self-pay | Admitting: Urology

## 2019-10-24 DIAGNOSIS — G8929 Other chronic pain: Secondary | ICD-10-CM

## 2019-10-25 NOTE — Addendum Note (Signed)
Addended by: [name] on: 10/25/2019 08:47 AM   Modules accepted: Orders

## 2019-10-29 ENCOUNTER — Other Ambulatory Visit: Payer: Self-pay

## 2019-10-29 DIAGNOSIS — E291 Testicular hypofunction: Secondary | ICD-10-CM

## 2019-11-04 ENCOUNTER — Other Ambulatory Visit: Payer: Self-pay

## 2019-11-04 ENCOUNTER — Other Ambulatory Visit: Payer: PPO

## 2019-11-04 DIAGNOSIS — E291 Testicular hypofunction: Secondary | ICD-10-CM | POA: Diagnosis not present

## 2019-11-05 ENCOUNTER — Encounter: Payer: Self-pay | Admitting: Urology

## 2019-11-05 LAB — TESTOSTERONE: Testosterone: 370 ng/dL (ref 264–916)

## 2019-11-05 LAB — PSA: Prostate Specific Ag, Serum: <0.1 ng/mL (ref 0.0–4.0)

## 2019-11-05 LAB — HEMATOCRIT: Hematocrit: 40.9 % (ref 37.5–51.0)

## 2019-11-11 DIAGNOSIS — M76899 Other specified enthesopathies of unspecified lower limb, excluding foot: Secondary | ICD-10-CM | POA: Diagnosis not present

## 2019-11-11 DIAGNOSIS — M25551 Pain in right hip: Secondary | ICD-10-CM | POA: Diagnosis not present

## 2019-11-11 DIAGNOSIS — M25552 Pain in left hip: Secondary | ICD-10-CM | POA: Diagnosis not present

## 2019-11-22 DIAGNOSIS — H401133 Primary open-angle glaucoma, bilateral, severe stage: Secondary | ICD-10-CM | POA: Diagnosis not present

## 2019-11-28 ENCOUNTER — Other Ambulatory Visit: Payer: Self-pay | Admitting: Cardiovascular Disease

## 2019-11-28 ENCOUNTER — Other Ambulatory Visit: Payer: Self-pay | Admitting: Family Medicine

## 2019-11-28 DIAGNOSIS — I10 Essential (primary) hypertension: Secondary | ICD-10-CM

## 2019-11-30 ENCOUNTER — Other Ambulatory Visit: Payer: Self-pay | Admitting: Family Medicine

## 2019-11-30 DIAGNOSIS — R1319 Other dysphagia: Secondary | ICD-10-CM

## 2019-11-30 DIAGNOSIS — K219 Gastro-esophageal reflux disease without esophagitis: Secondary | ICD-10-CM

## 2019-12-10 ENCOUNTER — Other Ambulatory Visit: Payer: Self-pay | Admitting: Urology

## 2019-12-18 NOTE — Progress Notes (Signed)
Date:  12/23/2019   ID:  [name], DOB 08-Nov-1932, MRN [id number]  Patient Location:  [address] [place] [address]   Provider location:   [facility], [place] office  PCP:  [name], DO  Cardiologist:  Arvid Right [facility]   Chief Complaint  Patient presents with  . Follow-up    6 month F/U-No new cardiac concerns; Meds verbally reviewed with patient.    History of Present Illness:    [name] is a 86 y.o. male past medical history of coronary artery disease, bypass surgery may 2014  atrial fibrillation, previously on amiodarone, s/p maze stent placed to his LAD in 1993, stent placed to his mid RCA in 2003,  in-stent restenosis with repeat stent placed to his LAD in September 08,  stent placed to his RCA at the same time,  stress test in February 2009 and Jan 2014 which showed no ischemia,  low testosterone  Constipation, chronic Back pain He presents for routine followup of his coronary artery disease, paroxysmal atrial fibrillation  Wife presents with him on todays visit Reports that he has stopped his statin Thinks it caused constipation Continues to take Zetia  Hip pain, followed by ortho, Dr. [name] Doing PT [facility] frequently  Prior lab work on statin and Zetia Total chol 128, LDL 72 HBA1C 5.7  Scheduled to have repeat lab work tomorrow Reports blood pressure stable at home Denies any chest pain concerning for angina Denies any tachycardia palpitations concerning for atrial fibrillation  EKG personally reviewed by myself on todays visit Shows normal sinus rhythm rate 76 bpm no significant ST-T wave changes  Other past medical history reviewed  Previous episode of dizziness in 2015 dizziness. He was unable to get out of bed without assistance. Dizziness occurred while supine as well as sitting and standing. He went to the emergency room, was told that he could be dehydrated and was given IV fluids x2 bags. This  seemed to improve his symptoms and was discharged home. Other workup in the hospital was negative including negative cardiac enzymes, normal basic metabolic panel, normal LFTs, normal EKG  Previously had chest pain in may 2014. Presented to [facility], cardiac catheterization showing subtotal occlusion of his LAD stent, in-stent restenosis, severe OM1 and OM 2 disease also with RCA disease, On 05/18/2012. He underwent CABG x 4 utilizing LIMA to LAD, SVG to OM1, SVG to OM2, and SVG to PDA. He also underwent Complete MAZE procedure with Clipping of Left Atrial Appendage.   He did not tolerate beta blockers or ACE inhibitor he had malaise, bradycardia. He took himself off these medications.   In January 2014 he had an episode of atrial fibrillation and presented to the emergency room. Converted back to normal sinus rhythm. Discharged on pradaxa. Recurrent episodes of atrial fibrillation shortly after that.   Prior CV studies:   The following studies were reviewed today:    Past Medical History:  Diagnosis Date  . Arthritis   . Coronary artery disease    a. 1993 s/p PCI/BMS LAD;  b. 2003 PCI RCA;  c. 09/2006 ISR LAD->PCI, PCI RCA;  d. 02/2007 MV: no ischemia;  d. 01/2012 MV: EF 74%, no ischemia.  [name] History of prostate cancer   . Hyperlipidemia   . Hyperthyroidism   . Low testosterone   . PAF (paroxysmal atrial fibrillation) ([place])    a. previously on amio ->stopped in 2013;  b. recurrent PAF 01/2012->Pradaxa and amio  initiated, subsequently came off of pradaxa 2/2 cost;  c. 02/2012 Echo: EF 55-60%, Gr 1 DD.  [name] Prostate cancer ([place])   . S/P CABG x 4    Past Surgical History:  Procedure Laterality Date  . APPENDECTOMY     Dr. Bary Castilla  . CARDIAC CATHETERIZATION  2008  . CLIPPING OF ATRIAL APPENDAGE Left 05/18/2012   Procedure: CLIPPING OF ATRIAL APPENDAGE;  Surgeon: [name], MD;  Location: [facility];  Service: Open Heart Surgery;  Laterality: Left;  . COLONOSCOPY  2013  . COLONOSCOPY WITH  PROPOFOL N/A 07/12/2016   Procedure: COLONOSCOPY WITH PROPOFOL;  Surgeon: [name], MD;  Location: [facility] ENDOSCOPY;  Service: Endoscopy;  Laterality: N/A;  . CORONARY ARTERY BYPASS GRAFT N/A 05/18/2012   Procedure: CORONARY ARTERY BYPASS GRAFTING (CABG);  Surgeon: [name], MD;  Location: [facility];  Service: Open Heart Surgery;  Laterality: N/A;  Times 4 using left internal mammary artery and endoscopically harvested right saphenous vein  . ESOPHAGOGASTRODUODENOSCOPY (EGD) WITH PROPOFOL N/A 10/24/2017   Procedure: ESOPHAGOGASTRODUODENOSCOPY (EGD) WITH PROPOFOL;  Surgeon: [name], MD;  Location: [facility] ENDOSCOPY;  Service: Endoscopy;  Laterality: N/A;  . HERNIA REPAIR     s/p mesh bilaterally, Dr. Bary Castilla  . INTRAOPERATIVE TRANSESOPHAGEAL ECHOCARDIOGRAM N/A 05/18/2012   Procedure: INTRAOPERATIVE TRANSESOPHAGEAL ECHOCARDIOGRAM;  Surgeon: [name], MD;  Location: [facility];  Service: Open Heart Surgery;  Laterality: N/A;  . IR ESOPHAGUS DILITATION RETRO FLUORO    . LEFT HEART CATHETERIZATION WITH CORONARY ANGIOGRAM Bilateral 05/16/2012   Procedure: LEFT HEART CATHETERIZATION WITH CORONARY ANGIOGRAM;  Surgeon: Burnell Blanks, MD;  Location: [facility] CATH LAB;  Service: Cardiovascular;  Laterality: Bilateral;  . MAZE N/A 05/18/2012   Procedure: MAZE;  Surgeon: [name], MD;  Location: [facility];  Service: Open Heart Surgery;  Laterality: N/A;  . MOHS SURGERY    . PROSTATECTOMY    . TOTAL HIP ARTHROPLASTY Right    Dr. [name]     Allergies:   Ambien [zolpidem tartrate], Pravastatin sodium, Rosuvastatin calcium, Statins, and Zolpidem tartrate   Social History   Tobacco Use  . Smoking status: Former Smoker    Types: Cigars    Quit date: 10/04/1988    Years since quitting: 31.2  . Smokeless tobacco: Former Systems developer    Types: [place] date: 09/04/1991  Vaping Use  . Vaping Use: Never used  Substance Use Topics  . Alcohol use: Yes    Comment: 1/3 cup of red wine occassionally  . Drug  use: No     Current Outpatient Medications on File Prior to Visit  Medication Sig Dispense Refill  . Acetaminophen (TYLENOL ARTHRITIS PAIN PO) Take by mouth as needed.    [name] amLODipine (NORVASC) 5 MG tablet TAKE ONE TABLET BY MOUTH DAILY 71 tablet 0  . aspirin EC 81 MG tablet Take 81 mg by mouth every evening.    . baclofen (LIORESAL) 10 MG tablet Take 0.5-1 tablets (5-10 mg total) by mouth 3 (three) times daily as needed for muscle spasms. 30 each 3  . bisacodyl (DULCOLAX) 10 MG suppository Place 10 mg rectally daily as needed for moderate constipation.    . Cholecalciferol (VITAMIN D3) 1.25 MG (50000 UT) CAPS Take by mouth daily.    . dorzolamide-timolol (COSOPT) 22.3-6.8 MG/ML ophthalmic solution 1 drop 2 (two) times daily.    [name] ezetimibe (ZETIA) 10 MG tablet Take 1 tablet (10 mg total) by mouth daily. 90 tablet 3  .  fluticasone (FLONASE) 50 MCG/ACT nasal spray Place 2 sprays into both nostrils daily. Use for 4-6 weeks then stop and use seasonally or as needed. 16 g 3  . hydrocortisone 2.5 % cream APPLY A THIN LAYER TO FACE TWICE A DAY AS NEEDED FOR 1 TO 2 WEEKS 30 g 1  . latanoprost (XALATAN) 0.005 % ophthalmic solution 0.005 drops daily.    . polyethylene glycol powder (GLYCOLAX/MIRALAX) 17 GM/SCOOP powder Take 17-34 g by mouth daily. For constipation 765 g 5  . testosterone cypionate (DEPOTESTOSTERONE CYPIONATE) 200 MG/ML injection INJECT 0.3ML INTO THE MUSCLE EVERY 14 DAYS 2 mL 0  . timolol (TIMOPTIC) 0.5 % ophthalmic solution Place 1 drop into both eyes daily.      No current facility-administered medications on file prior to visit.     Family Hx: The patient's family history includes Coronary artery disease in his mother and sister; Diabetes in his father and sister; Heart disease in his brother and mother; Heart disease (age of onset: 87) in his sister.  ROS:   Please see the history of present illness.    Review of Systems  Constitutional: Negative.   HENT: Negative.    Respiratory: Negative.   Cardiovascular: Negative.   Gastrointestinal: Negative.   Musculoskeletal: Negative.   Neurological: Negative.   Psychiatric/Behavioral: Negative.   All other systems reviewed and are negative.    Labs/Other Tests and Data Reviewed:    Recent Labs: 05/13/2019: ALT 10; BUN 22; Creat 1.31; Hemoglobin 13.2; Platelets 235; Potassium 4.3; Sodium 140   Recent Lipid Panel Lab Results  Component Value Date/Time   CHOL 128 05/13/2019 08:52 AM   CHOL 138 05/23/2016 10:01 AM   CHOL 123 05/14/2012 12:28 AM   TRIG 66 05/13/2019 08:52 AM   TRIG 53 05/14/2012 12:28 AM   HDL 41 05/13/2019 08:52 AM   HDL 38 (L) 05/23/2016 10:01 AM   HDL 44 05/14/2012 12:28 AM   CHOLHDL 3.1 05/13/2019 08:52 AM   LDLCALC 72 05/13/2019 08:52 AM   LDLCALC 68 05/14/2012 12:28 AM    Wt Readings from Last 3 Encounters:  12/23/19 159 lb (72.1 kg)  07/18/19 157 lb 9.6 oz (71.5 kg)  06/19/19 159 lb (72.1 kg)     Exam:    Vital Signs: Vital signs may also be detailed in the HPI BP 130/86 (BP Location: Left Arm, Patient Position: Sitting, Cuff Size: Normal)   Pulse 76   Ht 6' (1.829 m)   Wt 159 lb (72.1 kg)   SpO2 98%   BMI 21.56 kg/m   Constitutional:  oriented to person, place, and time. No distress.  HENT:  Head: Grossly normal Eyes:  no discharge. No scleral icterus.  Neck: No JVD, no carotid bruits  Cardiovascular: Regular rate and rhythm, no murmurs appreciated Pulmonary/Chest: Clear to auscultation bilaterally, no wheezes or rails Abdominal: Soft.  no distension.  no tenderness.  Musculoskeletal: Normal range of motion Neurological:  normal muscle tone. Coordination normal. No atrophy Skin: Skin warm and dry Psychiatric: normal affect, pleasant   ASSESSMENT & PLAN:    Problem List Items Addressed This Visit      Cardiology Problems   Paroxysmal atrial fibrillation (HCC)   Hyperlipidemia   AAA (abdominal aortic aneurysm) without rupture ([place])    Other Visit  Diagnoses    Atherosclerosis of native coronary artery of native heart with stable angina pectoris ([place])    -  Primary   Aortic atherosclerosis (HCC)       HYPERTENSION, BENIGN  S/P CABG x 4         CAD with stable angina Currently with no symptoms of angina. No further workup at this time. Continue current medication regimen.  Atrial fibrillation S/p maze maintaining normal sinus rhythm Currently not on anticoagulation Normal sinus rhythm on today's visit  Hypertension Blood pressure is well controlled on today's visit. No changes made to the medications.  Constipation, chronic Less likely secondary to statin Regular laxatives needed  Hyperlipidemia On Zetia Stopped statin on his own secondary to fear constipation Discussed options with patient and patient's wife Could try alternate statin, they prefer to wait until new lab work is back We did look at numbers where cholesterol did jump without the statin up to 40-50 points Ideally goal LDL less than 70   Signed, Ida Rogue, [place] [facility] [address], [place], [place] [address]

## 2019-12-23 ENCOUNTER — Other Ambulatory Visit: Payer: Self-pay

## 2019-12-23 ENCOUNTER — Encounter: Payer: Self-pay | Admitting: Cardiovascular Disease

## 2019-12-23 ENCOUNTER — Ambulatory Visit: Payer: PPO | Admitting: Cardiovascular Disease

## 2019-12-23 ENCOUNTER — Other Ambulatory Visit: Payer: Self-pay | Admitting: *Deleted

## 2019-12-23 VITALS — BP 130/86 | HR 76 | Ht 72.0 in | Wt 159.0 lb

## 2019-12-23 DIAGNOSIS — R7303 Prediabetes: Secondary | ICD-10-CM

## 2019-12-23 DIAGNOSIS — I25118 Atherosclerotic heart disease of native coronary artery with other forms of angina pectoris: Secondary | ICD-10-CM

## 2019-12-23 DIAGNOSIS — I700 Atherosclerosis of aorta: Secondary | ICD-10-CM | POA: Diagnosis not present

## 2019-12-23 DIAGNOSIS — I10 Essential (primary) hypertension: Secondary | ICD-10-CM

## 2019-12-23 DIAGNOSIS — Z951 Presence of aortocoronary bypass graft: Secondary | ICD-10-CM | POA: Diagnosis not present

## 2019-12-23 DIAGNOSIS — I480 Paroxysmal atrial fibrillation: Secondary | ICD-10-CM | POA: Diagnosis not present

## 2019-12-23 DIAGNOSIS — N183 Chronic kidney disease, stage 3 unspecified: Secondary | ICD-10-CM

## 2019-12-23 DIAGNOSIS — I714 Abdominal aortic aneurysm, without rupture, unspecified: Secondary | ICD-10-CM

## 2019-12-23 DIAGNOSIS — E782 Mixed hyperlipidemia: Secondary | ICD-10-CM

## 2019-12-23 DIAGNOSIS — R351 Nocturia: Secondary | ICD-10-CM

## 2019-12-23 DIAGNOSIS — I2510 Atherosclerotic heart disease of native coronary artery without angina pectoris: Secondary | ICD-10-CM

## 2019-12-23 DIAGNOSIS — F03A0 Unspecified dementia, mild, without behavioral disturbance, psychotic disturbance, mood disturbance, and anxiety: Secondary | ICD-10-CM

## 2019-12-23 DIAGNOSIS — Z0000 Encounter for general adult medical examination without abnormal findings: Secondary | ICD-10-CM

## 2019-12-23 NOTE — Patient Instructions (Addendum)
Medication Instructions:  No changes  If you need a refill on your cardiac medications before your next appointment, please call your pharmacy.    Lab work: No new labs needed   If you have labs (blood work) drawn today and your tests are completely normal, you will receive your results only by: . MyChart Message (if you have MyChart) OR . A paper copy in the mail If you have any lab test that is abnormal or we need to change your treatment, we will call you to review the results.   Testing/Procedures: No new testing needed   Follow-Up: At [facility], you and your health needs are our priority.  As part of our continuing mission to provide you with exceptional heart care, we have created designated Provider Care Teams.  These Care Teams include your primary Cardiologist (physician) and Advanced Practice Providers (APPs -  Physician Assistants and Nurse Practitioners) who all work together to provide you with the care you need, when you need it.  . You will need a follow up appointment in 6 months  . Providers on your designated Care Team:   . [name] Berge, NP . Ryan Dunn, PA-C . Jacquelyn Visser, PA-C  Any Other Special Instructions Will Be Listed Below (If Applicable).  COVID-19 Vaccine Information can be found at: [email or web address] For questions related to vaccine distribution or appointments, please email [email or web address] or call [phone].     

## 2019-12-24 ENCOUNTER — Other Ambulatory Visit: Payer: PPO

## 2019-12-24 DIAGNOSIS — Z0000 Encounter for general adult medical examination without abnormal findings: Secondary | ICD-10-CM | POA: Diagnosis not present

## 2019-12-24 DIAGNOSIS — F0390 Unspecified dementia without behavioral disturbance: Secondary | ICD-10-CM | POA: Diagnosis not present

## 2019-12-24 DIAGNOSIS — R351 Nocturia: Secondary | ICD-10-CM | POA: Diagnosis not present

## 2019-12-24 DIAGNOSIS — E782 Mixed hyperlipidemia: Secondary | ICD-10-CM | POA: Diagnosis not present

## 2019-12-24 DIAGNOSIS — N183 Chronic kidney disease, stage 3 unspecified: Secondary | ICD-10-CM | POA: Diagnosis not present

## 2019-12-24 DIAGNOSIS — I129 Hypertensive chronic kidney disease with stage 1 through stage 4 chronic kidney disease, or unspecified chronic kidney disease: Secondary | ICD-10-CM | POA: Diagnosis not present

## 2019-12-24 DIAGNOSIS — I2510 Atherosclerotic heart disease of native coronary artery without angina pectoris: Secondary | ICD-10-CM | POA: Diagnosis not present

## 2019-12-24 DIAGNOSIS — R7303 Prediabetes: Secondary | ICD-10-CM | POA: Diagnosis not present

## 2019-12-25 LAB — LIPID PANEL
Cholesterol: 166 mg/dL (ref <200)
HDL: 34 mg/dL — ABNORMAL LOW (ref >=40)
LDL Cholesterol (Calc): 110 mg/dL (calc) — ABNORMAL HIGH
Non-HDL Cholesterol (Calc): 132 mg/dL (calc) — ABNORMAL HIGH (ref <130)
Total CHOL/HDL Ratio: 4.9 (calc) (ref <5.0)
Triglycerides: 117 mg/dL (ref <150)

## 2019-12-25 LAB — CBC WITH DIFFERENTIAL/PLATELET
Absolute Monocytes: 563 cells/uL (ref 200–950)
Basophils Absolute: 17 cells/uL (ref 0–200)
Basophils Relative: 0.3 %
Eosinophils Absolute: 122 cells/uL (ref 15–500)
Eosinophils Relative: 2.1 %
HCT: 40.5 % (ref 38.5–50.0)
Hemoglobin: 13.3 g/dL (ref 13.2–17.1)
Lymphs Abs: 1282 cells/uL (ref 850–3900)
MCH: 27.3 pg (ref 27.0–33.0)
MCHC: 32.8 g/dL (ref 32.0–36.0)
MCV: 83.2 fL (ref 80.0–100.0)
MPV: 11.4 fL (ref 7.5–12.5)
Monocytes Relative: 9.7 %
Neutro Abs: 3816 cells/uL (ref 1500–7800)
Neutrophils Relative %: 65.8 %
Platelets: 233 10*3/uL (ref 140–400)
RBC: 4.87 10*6/uL (ref 4.20–5.80)
RDW: 12.9 % (ref 11.0–15.0)
Total Lymphocyte: 22.1 %
WBC: 5.8 10*3/uL (ref 3.8–10.8)

## 2019-12-25 LAB — COMPLETE METABOLIC PANEL WITH GFR
AG Ratio: 1.6 (calc) (ref 1.0–2.5)
ALT: 9 U/L (ref 9–46)
AST: 13 U/L (ref 10–35)
Albumin: 4.2 g/dL (ref 3.6–5.1)
Alkaline phosphatase (APISO): 84 U/L (ref 35–144)
BUN: 15 mg/dL (ref 7–25)
CO2: 27 mmol/L (ref 20–32)
Calcium: 9.4 mg/dL (ref 8.6–10.3)
Chloride: 104 mmol/L (ref 98–110)
Creat: 1.06 mg/dL (ref 0.70–1.11)
GFR, Est African American: 74 mL/min/{1.73_m2} (ref >=60)
GFR, Est Non African American: 64 mL/min/{1.73_m2} (ref >=60)
Globulin: 2.7 g/dL (calc) (ref 1.9–3.7)
Glucose, Bld: 112 mg/dL — ABNORMAL HIGH (ref 65–99)
Potassium: 4.2 mmol/L (ref 3.5–5.3)
Sodium: 139 mmol/L (ref 135–146)
Total Bilirubin: 0.8 mg/dL (ref 0.2–1.2)
Total Protein: 6.9 g/dL (ref 6.1–8.1)

## 2019-12-25 LAB — HEMOGLOBIN A1C
Hgb A1c MFr Bld: 6.2 % of total Hgb — ABNORMAL HIGH (ref <5.7)
Mean Plasma Glucose: 131 mg/dL
eAG (mmol/L): 7.3 mmol/L

## 2019-12-25 LAB — PSA: PSA: <0.04 ng/mL (ref <=4.0)

## 2019-12-25 LAB — TSH: TSH: 2.18 mIU/L (ref 0.40–4.50)

## 2019-12-26 ENCOUNTER — Telehealth: Payer: Self-pay | Admitting: Family Medicine

## 2019-12-26 NOTE — Telephone Encounter (Signed)
Copied from [place] [phone]. Topic: Medicare AWV >> Dec 26, 2019 12:19 PM Weston Anna wrote: Reason for CRM:   Tried contacting patient to let him know AWVS scheduled for Dec 31, 2019 will be completed by phone not in the office.  Patient's phone would answer then hang up - no voicemail.

## 2019-12-28 ENCOUNTER — Other Ambulatory Visit: Payer: Self-pay | Admitting: Cardiovascular Disease

## 2019-12-31 ENCOUNTER — Telehealth: Payer: Self-pay

## 2019-12-31 ENCOUNTER — Ambulatory Visit: Payer: PPO

## 2019-12-31 ENCOUNTER — Ambulatory Visit (INDEPENDENT_AMBULATORY_CARE_PROVIDER_SITE_OTHER): Payer: PPO | Admitting: Family Medicine

## 2019-12-31 ENCOUNTER — Other Ambulatory Visit: Payer: Self-pay

## 2019-12-31 ENCOUNTER — Encounter: Payer: Self-pay | Admitting: Family Medicine

## 2019-12-31 VITALS — BP 138/80 | HR 69 | Temp 97.3°F | Resp 16 | Ht 73.0 in | Wt 158.0 lb

## 2019-12-31 DIAGNOSIS — R7303 Prediabetes: Secondary | ICD-10-CM | POA: Diagnosis not present

## 2019-12-31 DIAGNOSIS — I129 Hypertensive chronic kidney disease with stage 1 through stage 4 chronic kidney disease, or unspecified chronic kidney disease: Secondary | ICD-10-CM

## 2019-12-31 DIAGNOSIS — I480 Paroxysmal atrial fibrillation: Secondary | ICD-10-CM | POA: Diagnosis not present

## 2019-12-31 DIAGNOSIS — F0390 Unspecified dementia without behavioral disturbance: Secondary | ICD-10-CM | POA: Diagnosis not present

## 2019-12-31 DIAGNOSIS — I10 Essential (primary) hypertension: Secondary | ICD-10-CM | POA: Diagnosis not present

## 2019-12-31 DIAGNOSIS — Z0000 Encounter for general adult medical examination without abnormal findings: Secondary | ICD-10-CM

## 2019-12-31 DIAGNOSIS — E782 Mixed hyperlipidemia: Secondary | ICD-10-CM | POA: Diagnosis not present

## 2019-12-31 DIAGNOSIS — N183 Chronic kidney disease, stage 3 unspecified: Secondary | ICD-10-CM

## 2019-12-31 DIAGNOSIS — F03A0 Unspecified dementia, mild, without behavioral disturbance, psychotic disturbance, mood disturbance, and anxiety: Secondary | ICD-10-CM

## 2019-12-31 MED ORDER — AMLODIPINE BESYLATE 5 MG PO TABS
5.0000 mg | ORAL_TABLET | Freq: Every day | ORAL | 3 refills | Status: DC
Start: 2019-12-31 — End: 2020-06-22

## 2019-12-31 NOTE — Progress Notes (Signed)
Subjective:    Patient ID: [name], male    DOB: March 21, 1932, 86 y.o.   MRN: [id number]  [name] is a 86 y.o. male presenting on 12/31/2019 for Annual Exam  Here with wife, [name].  Nurse health advisor attempted to do the AMW virtually today but patient was here in office and did not realize it was telephone visit.  HPI   Here for Annual Physical and Lab Review.  Pre-Diabetes Previously controlled A1c 5.6 to 5.8 Now last A1c 6.2 CBGs: not checking Meds:none Currentlynot onACEi / ARB Lifestyle: - Diet (limited sugars, improving diet) - Exercise (walking) Upcoming laser glaucoma surgery 01/17/20 Denies hypoglycemia, polyuria, visual changes, numbness or tingling.  GERD / Dysphagia w/ esophageal stricture - He had recent history of esophageal stricture on barium swallow done in 09/2017. He had EGD and esophageal stretching in 10/2017 by Dr Allen Norris with improvement initially then seemed less improved. He was rx Pantoprazole 40mg  daily with again some improve at first, now only marginal improvement. Still has some choking or dysphagia, but has not had regurgitation, they want to wait before repeat procedure or other intervention has not returned in follow-up. - Request refill on med.  Chronic Osteoarthritis, Back Pain Follow-upon chronic issue, he has tried some Tylenol Arthritis str 1-2 times a day with mild relief, Taking Meloxicam PRN. Using infrequently Followed by Orthopedic specialist. Admits issues with hips Denies any injury, fall, numbness tingling weakness  Hyperlipidemia Followed by Dr Rockey Situ, recently had lipids. Was advised that he may keep statin and zetia, but ultimately patient opted to continue monotherapy Zetia.  Atrial Fibrillation s/p maze ablation Last visit w/ Cardiology 12/2019 he has maintained sinus rhythm He is not taking anticoagulation  Health Maintenance: UTD Flu vaccine already 10/2019   Depression screen [facility] 2/9 12/31/2019  05/17/2019 12/25/2018  Decreased Interest 0 0 0  Down, Depressed, Hopeless 0 0 0  PHQ - 2 Score 0 0 0  Difficult doing work/chores - - -  Some recent data might be hidden    Past Medical History:  Diagnosis Date  . Arthritis   . Coronary artery disease    a. 1993 s/p PCI/BMS LAD;  b. 2003 PCI RCA;  c. 09/2006 ISR LAD->PCI, PCI RCA;  d. 02/2007 MV: no ischemia;  d. 01/2012 MV: EF 74%, no ischemia.  [name] History of prostate cancer   . Hyperlipidemia   . Hyperthyroidism   . Low testosterone   . PAF (paroxysmal atrial fibrillation) ([place])    a. previously on amio ->stopped in 2013;  b. recurrent PAF 01/2012->Pradaxa and amio initiated, subsequently came off of pradaxa 2/2 cost;  c. 02/2012 Echo: EF 55-60%, Gr 1 DD.  [name] Prostate cancer ([place])   . S/P CABG x 4    Past Surgical History:  Procedure Laterality Date  . APPENDECTOMY     Dr. Bary Castilla  . CARDIAC CATHETERIZATION  2008  . CLIPPING OF ATRIAL APPENDAGE Left 05/18/2012   Procedure: CLIPPING OF ATRIAL APPENDAGE;  Surgeon: [name], MD;  Location: [place];  Service: Open Heart Surgery;  Laterality: Left;  . COLONOSCOPY  2013  . COLONOSCOPY WITH PROPOFOL N/A 07/12/2016   Procedure: COLONOSCOPY WITH PROPOFOL;  Surgeon: [name], MD;  [facility] END[facility]COPY;  Service: Endoscopy;  Laterality: N/A;  . CORONARY ARTERY BYPASS GRAFT N/A 05/18/2012   Procedure: CORONARY ARTERY BYPASS GRAFTING (CABG);  Surgeon: [name], MD;  Location: [place];  Service: Open Heart Surgery;  Laterality: N/A;  Times 4 using left internal mammary artery and endoscopically harvested right saphenous vein  . ESOPHAGOGASTRODUODENOSCOPY (EGD) WITH PROPOFOL N/A 10/24/2017   Procedure: ESOPHAGOGASTRODUODENOSCOPY (EGD) WITH PROPOFOL;  Surgeon: [name], MD;  Location: [facility] ENDOSCOPY;  Service: Endoscopy;  Laterality: N/A;  . HERNIA REPAIR     s/p mesh bilaterally, Dr. Bary Castilla  . INTRAOPERATIVE TRANSESOPHAGEAL ECHOCARDIOGRAM N/A 05/18/2012   Procedure: INTRAOPERATIVE  TRANSESOPHAGEAL ECHOCARDIOGRAM;  Surgeon: [name], MD;  Location: [place];  Service: Open Heart Surgery;  Laterality: N/A;  . IR ESOPHAGUS DILITATION RETRO FLUORO    . LEFT HEART CATHETERIZATION WITH CORONARY ANGIOGRAM Bilateral 05/16/2012   Procedure: LEFT HEART CATHETERIZATION WITH CORONARY ANGIOGRAM;  Surgeon: Burnell Blanks, MD;  Location: [facility] CATH LAB;  Service: Cardiovascular;  Laterality: Bilateral;  . MAZE N/A 05/18/2012   Procedure: MAZE;  Surgeon: [name], MD;  Location: [place];  Service: Open Heart Surgery;  Laterality: N/A;  . MOHS SURGERY    . PROSTATECTOMY    . TOTAL HIP ARTHROPLASTY Right    Dr. [name]   Social History   Socioeconomic History  . Marital status: Married    Spouse name: [name] Hucks  . Number of children: 1  . Years of education: JD  . Highest education level: Bachelor's degree (e.g., BA, AB, BS)  Occupational History  . 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 Occupation: Former Geographical information systems officer  Tobacco Use  . Smoking status: Former Smoker    Types: Cigars    Quit date: 10/04/1988    Years since quitting: 31.2  . Smokeless tobacco: Former Systems developer    Types: [place] date: 09/04/1991  Vaping Use  . Vaping Use: Never used  Substance and Sexual Activity  . Alcohol use: Yes    Comment: 1/3 cup of red wine occassionally  . Drug use: No  . Sexual activity: Not Currently  Other Topics Concern  . Not on file  Social History Narrative   Lives in [place] with wife. He was born in [place] and went to [employer] in [place]. Daughter in [place], 2 step-daughters. Retired from [employer].   Social Determinants of Health   Financial Resource Strain: Not on file  Food Insecurity: Not on file  Transportation Needs: Not on file  Physical Activity: Not on file  Stress: Not on file  Social Connections: Not on file  Intimate Partner Violence: Not on file   Family History  Problem Relation Age  of Onset  . Coronary artery disease Mother   . Heart disease Mother   . Coronary artery disease Sister   . Diabetes Sister   . Heart disease Sister 24       deceased  . Diabetes Father   . Heart disease Brother    Current Outpatient Medications on File Prior to Visit  Medication Sig  . Acetaminophen (TYLENOL ARTHRITIS PAIN PO) Take by mouth as needed.  [name] aspirin EC 81 MG tablet Take 81 mg by mouth every evening.  . baclofen (LIORESAL) 10 MG tablet Take 0.5-1 tablets (5-10 mg total) by mouth 3 (three) times daily as needed for muscle spasms.  . bisacodyl (DULCOLAX) 10 MG suppository Place 10 mg rectally daily as needed for moderate constipation.  . Cholecalciferol (VITAMIN D3) 1.25 MG (50000 UT) CAPS Take by mouth daily.  . dorzolamide-timolol (COSOPT) 22.3-6.8 MG/ML ophthalmic solution 1 drop 2 (two) times daily.  [name] ezetimibe (ZETIA) 10 MG  tablet TAKE ONE TABLET BY MOUTH DAILY . PLEASE CALL [phone] TO SCHEDULE APPOINTMENT FOR FURTHER REFILLS  . fluticasone (FLONASE) 50 MCG/ACT nasal spray Place 2 sprays into both nostrils daily. Use for 4-6 weeks then stop and use seasonally or as needed.  . hydrocortisone 2.5 % cream APPLY A THIN LAYER TO FACE TWICE A DAY AS NEEDED FOR 1 TO 2 WEEKS  . latanoprost (XALATAN) 0.005 % ophthalmic solution 0.005 drops daily.  . meloxicam (MOBIC) 15 MG tablet Take 15 mg by mouth daily.  . polyethylene glycol powder (GLYCOLAX/MIRALAX) 17 GM/SCOOP powder Take 17-34 g by mouth daily. For constipation  . testosterone cypionate (DEPOTESTOSTERONE CYPIONATE) 200 MG/ML injection INJECT 0.3ML INTO THE MUSCLE EVERY 14 DAYS  . timolol (TIMOPTIC) 0.5 % ophthalmic solution Place 1 drop into both eyes daily.    No current facility-administered medications on file prior to visit.    Review of Systems  Constitutional: Negative for activity change, appetite change, chills, diaphoresis, fatigue and fever.  HENT: Negative for congestion and hearing loss.   Eyes:  Negative for visual disturbance.  Respiratory: Negative for cough, chest tightness, shortness of breath and wheezing.   Cardiovascular: Negative for chest pain, palpitations and leg swelling.  Gastrointestinal: Negative for abdominal pain, constipation, diarrhea, nausea and vomiting.  Endocrine: Negative for cold intolerance.  Genitourinary: Negative for dysuria, frequency and hematuria.  Musculoskeletal: Negative for arthralgias and neck pain.  Skin: Negative for rash.  Allergic/Immunologic: Negative for environmental allergies.  Neurological: Negative for dizziness, weakness, light-headedness, numbness and headaches.  Hematological: Negative for adenopathy.  Psychiatric/Behavioral: Negative for behavioral problems, dysphoric mood and sleep disturbance.   Per HPI unless specifically indicated above    Objective:    BP 138/80 (BP Location: Left Arm, Cuff Size: Normal)   Pulse 69   Temp (!) 97.3 F (36.3 C) (Temporal)   Resp 16   Ht 6\' 1"  (1.854 m)   Wt 158 lb (71.7 kg)   SpO2 100%   BMI 20.85 kg/m   Wt Readings from Last 3 Encounters:  12/31/19 158 lb (71.7 kg)  12/23/19 159 lb (72.1 kg)  07/18/19 157 lb 9.6 oz (71.5 kg)    Physical Exam Vitals and nursing note reviewed.  Constitutional:      General: He is not in acute distress.    Appearance: He is well-developed and well-nourished. He is not diaphoretic.     Comments: Well-appearing, comfortable, cooperative  HENT:     Head: Normocephalic and atraumatic.     Mouth/Throat:     Mouth: Oropharynx is clear and moist.  Eyes:     General:        Right eye: No discharge.        Left eye: No discharge.     Extraocular Movements: EOM normal.     Conjunctiva/sclera: Conjunctivae normal.     Pupils: Pupils are equal, round, and reactive to light.  Neck:     Thyroid: No thyromegaly.  Cardiovascular:     Rate and Rhythm: Normal rate and regular rhythm.     Pulses: Intact distal pulses.     Heart sounds: Normal heart  sounds. No murmur heard.   Pulmonary:     Effort: Pulmonary effort is normal. No respiratory distress.     Breath sounds: Normal breath sounds. No wheezing or rales.  Abdominal:     General: Bowel sounds are normal. There is no distension.     Palpations: Abdomen is soft. There is no mass.  Tenderness: There is no abdominal tenderness.  Musculoskeletal:        General: No tenderness or edema. Normal range of motion.     Cervical back: Normal range of motion and neck supple.     Right lower leg: No edema.     Left lower leg: No edema.     Comments: Upper / Lower Extremities: - Normal muscle tone, strength bilateral upper extremities 5/5, lower extremities 5/5  Lymphadenopathy:     Cervical: No cervical adenopathy.  Skin:    General: Skin is warm and dry.     Findings: No erythema or rash.  Neurological:     Mental Status: He is alert and oriented to person, place, and time.     Comments: Distal sensation intact to light touch all extremities  Psychiatric:        Mood and Affect: Mood and affect normal.        Behavior: Behavior normal.     Comments: Well groomed, good eye contact, normal speech and thoughts       Results for orders placed or performed in visit on 12/23/19  TSH  Result Value Ref Range   TSH 2.18 0.40 - 4.50 mIU/L  PSA  Result Value Ref Range   PSA <0.04 < OR = 4.0 ng/mL  Lipid panel  Result Value Ref Range   Cholesterol 166 <200 mg/dL   HDL 34 (L) > OR = 40 mg/dL   Triglycerides 354 <562 mg/dL   LDL Cholesterol (Calc) 110 (H) mg/dL (calc)   Total CHOL/HDL Ratio 4.9 <5.0 (calc)   Non-HDL Cholesterol (Calc) 132 (H) <130 mg/dL (calc)  COMPLETE METABOLIC PANEL WITH GFR  Result Value Ref Range   Glucose, Bld 112 (H) 65 - 99 mg/dL   BUN 15 7 - 25 mg/dL   Creat 5.63 8.93 - 7.34 mg/dL   GFR, Est Non African American 64 > OR = 60 mL/min/1.53m2   GFR, Est African American 74 > OR = 60 mL/min/1.37m2   BUN/Creatinine Ratio NOT APPLICABLE 6 - 22 (calc)    Sodium 139 135 - 146 mmol/L   Potassium 4.2 3.5 - 5.3 mmol/L   Chloride 104 98 - 110 mmol/L   CO2 27 20 - 32 mmol/L   Calcium 9.4 8.6 - 10.3 mg/dL   Total Protein 6.9 6.1 - 8.1 g/dL   Albumin 4.2 3.6 - 5.1 g/dL   Globulin 2.7 1.9 - 3.7 g/dL (calc)   AG Ratio 1.6 1.0 - 2.5 (calc)   Total Bilirubin 0.8 0.2 - 1.2 mg/dL   Alkaline phosphatase (APISO) 84 35 - 144 U/L   AST 13 10 - 35 U/L   ALT 9 9 - 46 U/L  CBC with Differential/Platelet  Result Value Ref Range   WBC 5.8 3.8 - 10.8 Thousand/uL   RBC 4.87 4.20 - 5.80 Million/uL   Hemoglobin 13.3 13.2 - 17.1 g/dL   HCT 28.7 68.1 - 15.7 %   MCV 83.2 80.0 - 100.0 fL   MCH 27.3 27.0 - 33.0 pg   MCHC 32.8 32.0 - 36.0 g/dL   RDW 26.2 03.5 - 59.7 %   Platelets 233 140 - 400 Thousand/uL   MPV 11.4 7.5 - 12.5 fL   Neutro Abs 3,816 1,500 - 7,800 cells/uL   Lymphs Abs 1,282 850 - 3,900 cells/uL   Absolute Monocytes 563 200 - 950 cells/uL   Eosinophils Absolute 122 15 - 500 cells/uL   Basophils Absolute 17 0 - 200 cells/uL  Neutrophils Relative % 65.8 %   Total Lymphocyte 22.1 %   Monocytes Relative 9.7 %   Eosinophils Relative 2.1 %   Basophils Relative 0.3 %  Hemoglobin A1c  Result Value Ref Range   Hgb A1c MFr Bld 6.2 (H) <5.7 % of total Hgb   Mean Plasma Glucose 131 mg/dL   eAG (mmol/L) 7.3 mmol/L      Assessment & Plan:   Problem List Items Addressed This Visit    Pre-diabetes    Elevated A1c to 6.2 Concern with HTN, HLD, CAD  Plan:  1. Not on any therapy currently 2. Encourage improved lifestyle - low carb, low sugar diet, reduce portion size, continue improving regular exercise  3 month repeat A1c      Paroxysmal atrial fibrillation (HCC)    Followed by Dr Rockey Situ [facility] Cardiology Last visit 12/2019, remains in sinus rhythm S/p Maze Not on anticoagulation      Relevant Medications   amLODipine (NORVASC) 5 MG tablet   Mild dementia (HCC)    Stable chronic problem with mild memory loss No significant behavioral  concerns Not on medication Reassurance Follow-up, if worsening consider repeat screening and evaluation      Hyperlipidemia    Mostly Controlled cholesterol Last lipid 12/2019 ASCVD known CAD s/p CABG  Plan: 1. Continue Zetia monotherapy. Has discontinued Simvastatin 2. Continue ASA 81mg  for secondary ASCVD risk reduction 3. Encourage improved lifestyle - low carb/cholesterol, reduce portion size, continue improving regular exercise      Relevant Medications   amLODipine (NORVASC) 5 MG tablet   Benign hypertension with CKD (chronic kidney disease) stage III (HCC)    Well-controlled HTN Complication with CKD III now improved on lab Cr    Plan:  1. Continue current BP regimen  - Amlodipine 5mg  daily - re order 2. Encourage improved lifestyle - ow sodium diet, regular exercise 3. Continue monitor BP outside office, bring readings to next visit, if persistently >140/90 or new symptoms notify office sooner      Relevant Medications   amLODipine (NORVASC) 5 MG tablet    Other Visit Diagnoses    Annual physical exam    -  Primary   Benign essential hypertension       Relevant Medications   amLODipine (NORVASC) 5 MG tablet      Updated Health Maintenance information - UTD Flu Shot - UTD COVID vaccine and booster Reviewed recent lab results with patient Encouraged improvement to lifestyle with diet and exercise - Goal of weight loss   Meds ordered this encounter  Medications  . amLODipine (NORVASC) 5 MG tablet    Sig: Take 1 tablet (5 mg total) by mouth daily.    Dispense:  90 tablet    Refill:  3    Patient just filled today, please keep on file and add extra refills      Follow up plan: Return in about 3 months (around 03/30/2020) for 3 month PreDM A1c, HTN, Arthritis.  [name], DO [facility] 12/31/2019, 2:36 PM

## 2019-12-31 NOTE — Telephone Encounter (Signed)
This nurse attempted to call patient in order to perform telephonic AWV. Called three times. Call would answer and hang up. No voicemail.

## 2019-12-31 NOTE — Patient Instructions (Addendum)
Thank you for coming to the office today.  Her number is [phone]. [name] is her name she is available 8:30-4:30   Work on sugar, and next time fingerstick sugar, anytime  Diet Recommendations for Pre-Diabetes   LIMIT Starchy (carb) foods include: Bread, rice, pasta, potatoes, corn, crackers, bagels, muffins, all baked goods.   High Protein foods include: Meat, fish, poultry, eggs, dairy foods, and beans such as pinto and kidney beans (beans also provide carbohydrate).   1. Eat at least 3 meals and 1-2 snacks per day. Never go more than 4-5 hours while awake without eating.   2. Limit starchy foods to TWO per meal and ONE per snack. ONE portion of a starchy  food is equal to the following:   - ONE slice of bread (or its equivalent, such as half of a hamburger bun).   - 1/2 cup of a "scoopable" starchy food such as potatoes or rice.   - 1 OUNCE (28 grams) of starchy snacks (crackers or pretzels, look on label).   - 15 grams of carbohydrate as shown on food label.   3. Both lunch and dinner should include a protein food, a carb food, and vegetables.   - Obtain twice as many veg's as protein or carbohydrate foods for both lunch and dinner.   - Try to keep frozen veg's on hand for a quick vegetable serving.     - Fresh or frozen veg's are best.   4. Breakfast should always include protein.     Please schedule a Follow-up Appointment to: Return in about 3 months (around 03/30/2020) for 3 month PreDM A1c, HTN, Arthritis.  If you have any other questions or concerns, please feel free to call the office or send a message through [facility]. You may also schedule an earlier appointment if necessary.  Additionally, you may be receiving a survey about your experience at our office within a few days to 1 week by e-mail or mail. We value your feedback.  [name], DO [place]

## 2020-01-01 NOTE — Assessment & Plan Note (Addendum)
Well-controlled HTN Complication with CKD III now improved on lab Cr    Plan:  1. Continue current BP regimen  - Amlodipine 5mg  daily - re order 2. Encourage improved lifestyle - ow sodium diet, regular exercise 3. Continue monitor BP outside office, bring readings to next visit, if persistently >140/90 or new symptoms notify office sooner

## 2020-01-01 NOTE — Assessment & Plan Note (Signed)
Followed by Dr Rockey Situ [facility] Cardiology Last visit 12/2019, remains in sinus rhythm S/p Maze Not on anticoagulation

## 2020-01-01 NOTE — Assessment & Plan Note (Signed)
Elevated A1c to 6.2 Concern with HTN, HLD, CAD  Plan:  1. Not on any therapy currently 2. Encourage improved lifestyle - low carb, low sugar diet, reduce portion size, continue improving regular exercise  3 month repeat A1c

## 2020-01-01 NOTE — Assessment & Plan Note (Signed)
Mostly Controlled cholesterol Last lipid 12/2019 ASCVD known CAD s/p CABG  Plan: 1. Continue Zetia monotherapy. Has discontinued Simvastatin 2. Continue ASA 81mg  for secondary ASCVD risk reduction 3. Encourage improved lifestyle - low carb/cholesterol, reduce portion size, continue improving regular exercise

## 2020-01-01 NOTE — Assessment & Plan Note (Signed)
Stable chronic problem with mild memory loss No significant behavioral concerns Not on medication Reassurance Follow-up, if worsening consider repeat screening and evaluation 

## 2020-01-14 ENCOUNTER — Ambulatory Visit (INDEPENDENT_AMBULATORY_CARE_PROVIDER_SITE_OTHER): Payer: PPO

## 2020-01-14 VITALS — Ht 72.0 in | Wt 160.0 lb

## 2020-01-14 DIAGNOSIS — Z0000 Encounter for general adult medical examination without abnormal findings: Secondary | ICD-10-CM

## 2020-01-14 NOTE — Progress Notes (Signed)
I connected with [name] today by telephone and verified that I am speaking with the correct person using two identifiers. Location patient: home Location provider: work Persons participating in the virtual visit: [name], spouse Adella Hare, Glenna Durand LPN.   I discussed the limitations, risks, security and privacy concerns of performing an evaluation and management service by telephone and the availability of in person appointments. I also discussed with the patient that there may be a patient responsible charge related to this service. The patient expressed understanding and verbally consented to this telephonic visit.    Interactive audio and video telecommunications were attempted between this provider and patient, however failed, due to patient having technical difficulties OR patient did not have access to video capability.  We continued and completed visit with audio only.     Vital signs may be patient reported or missing.  Subjective:   [name] is a 87 y.o. male who presents for Medicare Annual/Subsequent preventive examination.  Review of Systems     Cardiac Risk Factors include: advanced age (>92men, >8 women);dyslipidemia;hypertension;male gender     Objective:    Today's Vitals   01/14/20 1127  Weight: 160 lb (72.6 kg)  Height: 6' (1.829 m)   Body mass index is 21.7 kg/m.  Advanced Directives 01/14/2020 02/06/2019 12/30/2018 12/25/2018 12/19/2017 10/24/2017 12/06/2016  Does Patient Have a Medical Advance Directive? Yes Yes Yes Yes Yes No No  Type of Paramedic of [place];Living will Living will Living will Living will;Healthcare Power of [place];Living will - -  Copy of [place] in Chart? No - copy requested - - - No - copy requested - -  Would patient like information on creating a medical advance directive? - - - - - - No - Patient declined  Pre-existing out of facility  DNR order (yellow form or pink MOST form) - - - - - - -    Current Medications (verified) Outpatient Encounter Medications as of 01/14/2020  Medication Sig  . Acetaminophen (TYLENOL ARTHRITIS PAIN PO) Take by mouth as needed.  [name] amLODipine (NORVASC) 5 MG tablet Take 1 tablet (5 mg total) by mouth daily.  [name] aspirin EC 81 MG tablet Take 81 mg by mouth every evening.  . baclofen (LIORESAL) 10 MG tablet Take 0.5-1 tablets (5-10 mg total) by mouth 3 (three) times daily as needed for muscle spasms.  . bisacodyl (DULCOLAX) 10 MG suppository Place 10 mg rectally daily as needed for moderate constipation.  . Cholecalciferol (VITAMIN D3) 1.25 MG (50000 UT) CAPS Take by mouth daily.  . dorzolamide-timolol (COSOPT) 22.3-6.8 MG/ML ophthalmic solution 1 drop 2 (two) times daily.  [name] ezetimibe (ZETIA) 10 MG tablet TAKE ONE TABLET BY MOUTH DAILY . PLEASE CALL [phone] TO SCHEDULE APPOINTMENT FOR FURTHER REFILLS  . fluticasone (FLONASE) 50 MCG/ACT nasal spray Place 2 sprays into both nostrils daily. Use for 4-6 weeks then stop and use seasonally or as needed.  . hydrocortisone 2.5 % cream APPLY A THIN LAYER TO FACE TWICE A DAY AS NEEDED FOR 1 TO 2 WEEKS  . latanoprost (XALATAN) 0.005 % ophthalmic solution 0.005 drops daily.  . meloxicam (MOBIC) 15 MG tablet Take 15 mg by mouth daily.  . polyethylene glycol powder (GLYCOLAX/MIRALAX) 17 GM/SCOOP powder Take 17-34 g by mouth daily. For constipation  . testosterone cypionate (DEPOTESTOSTERONE CYPIONATE) 200 MG/ML injection INJECT 0.3ML INTO THE MUSCLE EVERY 14 DAYS  . timolol (TIMOPTIC) 0.5 % ophthalmic solution Place  1 drop into both eyes daily.    No facility-administered encounter medications on file as of 01/14/2020.    Allergies (verified) Ambien [zolpidem tartrate], Pravastatin sodium, Rosuvastatin calcium, Statins, and Zolpidem tartrate   History: Past Medical History:  Diagnosis Date  . Arthritis   . Coronary artery disease    a. 1993 s/p PCI/BMS  LAD;  b. 2003 PCI RCA;  c. 09/2006 ISR LAD->PCI, PCI RCA;  d. 02/2007 MV: no ischemia;  d. 01/2012 MV: EF 74%, no ischemia.  [name] History of prostate cancer   . Hyperlipidemia   . Hyperthyroidism   . Low testosterone   . PAF (paroxysmal atrial fibrillation) (HCC)    a. previously on amio ->stopped in 2013;  b. recurrent PAF 01/2012->Pradaxa and amio initiated, subsequently came off of pradaxa 2/2 cost;  c. 02/2012 Echo: EF 55-60%, Gr 1 DD.  [name] Prostate cancer (HCC)   . S/P CABG x 4    Past Surgical History:  Procedure Laterality Date  . APPENDECTOMY     Dr. Lemar Livings  . CARDIAC CATHETERIZATION  2008  . CLIPPING OF ATRIAL APPENDAGE Left 05/18/2012   Procedure: CLIPPING OF ATRIAL APPENDAGE;  Surgeon: Kerin Perna, MD;  Location: [facility] OR;  Service: Open Heart Surgery;  Laterality: Left;  . COLONOSCOPY  2013  . COLONOSCOPY WITH PROPOFOL N/A 07/12/2016   Procedure: COLONOSCOPY WITH PROPOFOL;  Surgeon: Midge Minium, MD;  Location: [facility] ENDOSCOPY;  Service: Endoscopy;  Laterality: N/A;  . CORONARY ARTERY BYPASS GRAFT N/A 05/18/2012   Procedure: CORONARY ARTERY BYPASS GRAFTING (CABG);  Surgeon: Kerin Perna, MD;  Location[facility] OR;  Service: Open Heart Surgery;  Laterality: N/A;  Times 4 using left internal mammary artery and endoscopically harvested right saphenous vein  . ESOPHAGOGASTRODUODENOSCOPY (EGD) WITH PROPOFOL N/A 10/24/2017   Procedure: ESOPHAGOGASTRODUODENOSCOPY (EGD) WITH PROPOFOL;  Surgeon: Midge Minium, MD;  Location: [facility] ENDOSCOPY;  Service: Endoscopy;  Laterality: N/A;  . HERNIA REPAIR     s/p mesh bilaterally, Dr. Lemar Livings  . INTRAOPERATIVE TRANSESOPHAGEAL ECHOCARDIOGRAM N/A 05/18/2012   Procedure: INTRAOPERATIVE TRANSESOPHAGEAL ECHOCARDIOGRAM;  Surgeon: Kerin Perna, MD;  Location: [facility] OR;  Service: Open Heart Surgery;  Laterality: N/A;  . IR ESOPHAGUS DILITATION RETRO FLUORO    . LEFT HEART CATHETERIZATION WITH CORONARY ANGIOGRAM Bilateral 05/16/2012   Procedure: LEFT HEART CATHETERIZATION  WITH CORONARY ANGIOGRAM;  Surgeon: Kathleene Hazel, MD;  [facility];  Service: Cardiovascular;  Laterality: Bilateral;  . MAZE N/A 05/18/2012   Procedure: MAZE;  Surgeon: Kerin Perna, MD;  Location[facility] OR;  Service: Open Heart Surgery;  Laterality: N/A;  . MOHS SURGERY    . PROSTATECTOMY    . TOTAL HIP ARTHROPLASTY Right    Dr. Rosita Kea   Family History  Problem Relation Age of Onset  . Coronary artery disease Mother   . Heart disease Mother   . Coronary artery disease Sister   . Diabetes Sister   . Heart disease Sister 29       deceased  . Diabetes Father   . Heart disease Brother    Social History   Socioeconomic History  . Marital status: Married    Spouse name: [name] Hucks  . Number of children: 1  . Years of education: JD  . Highest education level: Bachelor's degree (e.g., BA, AB, BS)  Occupational History  . 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 Occupation: Former Games developer  Tobacco Use  .  Smoking status: Former Smoker    Types: Cigars    Quit date: 10/04/1988    Years since quitting: 31.2  . Smokeless tobacco: Former Systems developer    Types: [place] date: 09/04/1991  Vaping Use  . Vaping Use: Never used  Substance and Sexual Activity  . Alcohol use: Yes    Comment: 1/3 cup of red wine occassionally  . Drug use: No  . Sexual activity: Not Currently  Other Topics Concern  . Not on file  Social History Narrative   Lives in [place] with wife. He was born in [place] and went to [place] in [place]. Daughter in [place], 2 step-daughters. Retired from [employer].   Social Determinants of Health   Financial Resource Strain: Low Risk   . Difficulty of Paying Living Expenses: Not hard at all  Food Insecurity: No Food Insecurity  . Worried About Charity fundraiser in the Last Year: Never true  . Ran Out of Food in the Last Year: Never true  Transportation Needs: No Transportation Needs  .  Lack of Transportation (Medical): No  . Lack of Transportation (Non-Medical): No  Physical Activity: Insufficiently Active  . Days of Exercise per Week: 7 days  . Minutes of Exercise per Session: 20 min  Stress: No Stress Concern Present  . Feeling of Stress : Not at all  Social Connections: Not on file    Tobacco Counseling Counseling given: Not Answered   Clinical Intake:  Pre-visit preparation completed: Yes  Pain : No/denies pain     Nutritional Status: BMI of 19-24  Normal Nutritional Risks: None Diabetes: No  How often do you need to have someone help you when you read instructions, pamphlets, or other written materials from your doctor or pharmacy?: 1 - Never What is the last grade level you completed in school?: 20yrs law school  Diabetic? no  Interpreter Needed?: No  Information entered by :: NAllen LPN   Activities of Daily Living In your present state of health, do you have any difficulty performing the following activities: 01/14/2020 12/31/2019  Hearing? Tempie Donning  Vision? Y Y  Comment glaucoma -  Difficulty concentrating or making decisions? Y Y  Comment dementia -  Walking or climbing stairs? N Y  Dressing or bathing? N N  Doing errands, shopping? Tempie Donning  Comment spouse drives -  Conservation officer, nature and eating ? N -  Using the Toilet? N -  In the past six months, have you accidently leaked urine? Y -  Do you have problems with loss of bowel control? N -  Managing your Medications? Y -  Comment spouse sets up -  Managing your Finances? Y -  Housekeeping or managing your Housekeeping? Y -  Some recent data might be hidden    Patient Care Team: [name], DO as PCP - General (Family Medicine) Minna Merritts, MD as Consulting Physician (Cardiology) Dingeldein, Remo Lipps, MD (Ophthalmology)  Indicate any recent Medical Services you may have received from other than [facility] providers in the past year (date may be approximate).     Assessment:    This is a routine wellness examination for Jerel.  Hearing/Vision screen No exam data present  Dietary issues and exercise activities discussed: Current Exercise Habits: Home exercise routine, Type of exercise: stretching;calisthenics, Time (Minutes): 20, Frequency (Times/Week): 7, Weekly Exercise (Minutes/Week): 140  Goals    . DIET - INCREASE WATER INTAKE     Recommend drinking at least 6-8 glasses  of water a day    . Increase water intake     STAY HYDRATED AND DRINK PLENTY OF WATER.    . Patient Stated     01/14/2020, keep moving      Depression Screen PHQ 2/9 Scores 01/14/2020 12/31/2019 05/17/2019 12/25/2018 11/16/2018 12/19/2017 06/07/2017  PHQ - 2 Score 0 0 0 0 0 0 1    Fall Risk Fall Risk  01/14/2020 12/31/2019 05/17/2019 12/25/2018 12/05/2018  Falls in the past year? 0 0 0 0 0  Comment - - - - Emmi Telephone Survey: data to providers prior to load  Number falls in past yr: - 0 0 0 -  Injury with Fall? - 0 0 0 -  Risk for fall due to : Medication side effect;Impaired balance/gait;Impaired mobility - - - -  Follow up Falls evaluation completed;Education provided;Falls prevention discussed Falls evaluation completed Falls evaluation completed - -    FALL RISK PREVENTION PERTAINING TO THE HOME:  Any stairs in or around the home? Yes  If so, are there any without handrails? No  Home free of loose throw rugs in walkways, pet beds, electrical cords, etc? Yes  Adequate lighting in your home to reduce risk of falls? Yes   ASSISTIVE DEVICES UTILIZED TO PREVENT FALLS:  Life alert? No  Use of a cane, walker or w/c? No  Grab bars in the bathroom? Yes  Shower chair or bench in shower? Yes  Elevated toilet seat or a handicapped toilet? Yes   TIMED UP AND GO:  Was the test performed? No .    Cognitive Function: MMSE - Mini Mental State Exam 06/07/2017 11/24/2015 11/28/2014  Orientation to time 5 5 5   Orientation to Place 5 5 5   Registration 3 3 3   Attention/ Calculation 5 5 5    Recall 3 3 3   Language- name 2 objects 2 2 2   Language- repeat 1 1 1   Language- follow 3 step command 3 3 3   Language- read & follow direction 1 1 1   Write a sentence 1 1 1   Copy design 1 1 1   Total score 30 30 30      6CIT Screen 01/14/2020 12/06/2016  What Year? 0 points 0 points  What month? 0 points 0 points  What time? 0 points 0 points  Count back from 20 0 points 0 points  Months in reverse 4 points 0 points  Repeat phrase 10 points 0 points  Total Score 14 0    Immunizations Immunization History  Administered Date(s) Administered  . Influenza Split 10/05/2010, 10/27/2011, 10/10/2013  . Influenza, High Dose Seasonal PF 09/23/2015, 09/19/2016, 09/17/2017, 08/27/2018, 10/20/2019  . Influenza,inj,Quad PF,6+ Mos 09/18/2012, 11/04/2014  . PFIZER SARS-COV-2 Vaccination 02/11/2019, 03/04/2019, 10/21/2019  . Pneumococcal Conjugate-13 02/19/2013  . Pneumococcal Polysaccharide-23 11/15/2007    TDAP status: Due, Education has been provided regarding the importance of this vaccine. Advised may receive this vaccine at local pharmacy or Health Dept. Aware to provide a copy of the vaccination record if obtained from local pharmacy or Health Dept. Verbalized acceptance and understanding.  Flu Vaccine status: Up to date  Pneumococcal vaccine status: Up to date  Covid-19 vaccine status: Completed vaccines  Qualifies for Shingles Vaccine? Yes   Zostavax completed No   Shingrix Completed?: No.    Education has been provided regarding the importance of this vaccine. Patient has been advised to call insurance company to determine out of pocket expense if they have not yet received this vaccine. Advised may also receive  vaccine at local pharmacy or Health Dept. Verbalized acceptance and understanding.  Screening Tests Health Maintenance  Topic Date Due  . TETANUS/TDAP  Never done  . INFLUENZA VACCINE  Completed  . COVID-19 Vaccine  Completed  . PNA vac Low Risk Adult  Completed     Health Maintenance  Health Maintenance Due  Topic Date Due  . TETANUS/TDAP  Never done    Colorectal cancer screening: No longer required.   Lung Cancer Screening: (Low Dose CT Chest recommended if Age 23-80 years, 30 pack-year currently smoking OR have quit w/in 15years.) does not qualify.   Lung Cancer Screening Referral: no  Additional Screening:  Hepatitis C Screening: does not qualify;   Vision Screening: Recommended annual ophthalmology exams for early detection of glaucoma and other disorders of the eye. Is the patient up to date with their annual eye exam?  Yes  Who is the provider or what is the name of the office in which the patient attends annual eye exams? [facility] If pt is not established with a provider, would they like to be referred to a provider to establish care? No .   Dental Screening: Recommended annual dental exams for proper oral hygiene  Community Resource Referral / Chronic Care Management: CRR required this visit?  No   CCM required this visit?  No      Plan:     I have personally reviewed and noted the following in the patient's chart:   . Medical and social history . Use of alcohol, tobacco or illicit drugs  . Current medications and supplements . Functional ability and status . Nutritional status . Physical activity . Advanced directives . List of other physicians . Hospitalizations, surgeries, and ER visits in previous 12 months . Vitals . Screenings to include cognitive, depression, and falls . Referrals and appointments  In addition, I have reviewed and discussed with patient certain preventive protocols, quality metrics, and best practice recommendations. A written personalized care plan for preventive services as well as general preventive health recommendations were provided to patient.     Kellie Simmering, LPN   [id number]   Nurse Notes:

## 2020-01-14 NOTE — Patient Instructions (Signed)
[name] , Thank you for taking time to come for your Medicare Wellness Visit. I appreciate your ongoing commitment to your health goals. Please review the following plan we discussed and let me know if I can assist you in the future.   Screening recommendations/referrals: Colonoscopy: not required Recommended yearly ophthalmology/optometry visit for glaucoma screening and checkup Recommended yearly dental visit for hygiene and checkup  Vaccinations: Influenza vaccine: completed 10/20/2019, due 08/10/2020 Pneumococcal vaccine: completed 02/19/2013 Tdap vaccine: due Shingles vaccine: discussed   Covid-19:  10/21/2019, 03/04/2019, 02/11/2019  Advanced directives: Please bring a copy of your POA (Power of Attorney) and/or Living Will to your next appointment.   Conditions/risks identified: none  Next appointment: Follow up in one year for your annual wellness visit.   Preventive Care 85 Years and Older, Male Preventive care refers to lifestyle choices and visits with your health care provider that can promote health and wellness. What does preventive care include?  A yearly physical exam. This is also called an annual well check.  Dental exams once or twice a year.  Routine eye exams. Ask your health care provider how often you should have your eyes checked.  Personal lifestyle choices, including:  Daily care of your teeth and gums.  Regular physical activity.  Eating a healthy diet.  Avoiding tobacco and drug use.  Limiting alcohol use.  Practicing safe sex.  Taking low doses of aspirin every day.  Taking vitamin and mineral supplements as recommended by your health care provider. What happens during an annual well check? The services and screenings done by your health care provider during your annual well check will depend on your age, overall health, lifestyle risk factors, and family history of disease. Counseling  Your health care provider may ask you questions about  your:  Alcohol use.  Tobacco use.  Drug use.  Emotional well-being.  Home and relationship well-being.  Sexual activity.  Eating habits.  History of falls.  Memory and ability to understand (cognition).  Work and work Astronomer. Screening  You may have the following tests or measurements:  Height, weight, and BMI.  Blood pressure.  Lipid and cholesterol levels. These may be checked every 5 years, or more frequently if you are over 85 years old.  Skin check.  Lung cancer screening. You may have this screening every year starting at age 13 if you have a 30-pack-year history of smoking and currently smoke or have quit within the past 15 years.  Fecal occult blood test (FOBT) of the stool. You may have this test every year starting at age 85.  Flexible sigmoidoscopy or colonoscopy. You may have a sigmoidoscopy every 5 years or a colonoscopy every 10 years starting at age 85.  Prostate cancer screening. Recommendations will vary depending on your family history and other risks.  Hepatitis C blood test.  Hepatitis B blood test.  Sexually transmitted disease (STD) testing.  Diabetes screening. This is done by checking your blood sugar (glucose) after you have not eaten for a while (fasting). You may have this done every 1-3 years.  Abdominal aortic aneurysm (AAA) screening. You may need this if you are a current or former smoker.  Osteoporosis. You may be screened starting at age 85 if you are at high risk. Talk with your health care provider about your test results, treatment options, and if necessary, the need for more tests. Vaccines  Your health care provider may recommend certain vaccines, such as:  Influenza vaccine. This is recommended every  year.  Tetanus, diphtheria, and acellular pertussis (Tdap, Td) vaccine. You may need a Td booster every 10 years.  Zoster vaccine. You may need this after age 5.  Pneumococcal 13-valent conjugate (PCV13) vaccine.  One dose is recommended after age 85.  Pneumococcal polysaccharide (PPSV23) vaccine. One dose is recommended after age 85. Talk to your health care provider about which screenings and vaccines you need and how often you need them. This information is not intended to replace advice given to you by your health care provider. Make sure you discuss any questions you have with your health care provider. Document Released: 01/23/2015 Document Revised: 09/16/2015 Document Reviewed: 10/28/2014 Elsevier Interactive Patient Education  2017 [place] Prevention in the Home Falls can cause injuries. They can happen to people of all ages. There are many things you can do to make your home safe and to help prevent falls. What can I do on the outside of my home?  Regularly fix the edges of walkways and driveways and fix any cracks.  Remove anything that might make you trip as you walk through a door, such as a raised step or threshold.  Trim any bushes or trees on the path to your home.  Use bright outdoor lighting.  Clear any walking paths of anything that might make someone trip, such as rocks or tools.  Regularly check to see if handrails are loose or broken. Make sure that both sides of any steps have handrails.  Any raised decks and porches should have guardrails on the edges.  Have any leaves, snow, or ice cleared regularly.  Use sand or salt on walking paths during winter.  Clean up any spills in your garage right away. This includes oil or grease spills. What can I do in the bathroom?  Use night lights.  Install grab bars by the toilet and in the tub and shower. Do not use towel bars as grab bars.  Use non-skid mats or decals in the tub or shower.  If you need to sit down in the shower, use a plastic, non-slip stool.  Keep the floor dry. Clean up any water that spills on the floor as soon as it happens.  Remove soap buildup in the tub or shower regularly.  Attach bath  mats securely with double-sided non-slip rug tape.  Do not have throw rugs and other things on the floor that can make you trip. What can I do in the bedroom?  Use night lights.  Make sure that you have a light by your bed that is easy to reach.  Do not use any sheets or blankets that are too big for your bed. They should not hang down onto the floor.  Have a firm chair that has side arms. You can use this for support while you get dressed.  Do not have throw rugs and other things on the floor that can make you trip. What can I do in the kitchen?  Clean up any spills right away.  Avoid walking on wet floors.  Keep items that you use a lot in easy-to-reach places.  If you need to reach something above you, use a strong step stool that has a grab bar.  Keep electrical cords out of the way.  Do not use floor polish or wax that makes floors slippery. If you must use wax, use non-skid floor wax.  Do not have throw rugs and other things on the floor that can make you trip. What can  I do with my stairs?  Do not leave any items on the stairs.  Make sure that there are handrails on both sides of the stairs and use them. Fix handrails that are broken or loose. Make sure that handrails are as long as the stairways.  Check any carpeting to make sure that it is firmly attached to the stairs. Fix any carpet that is loose or worn.  Avoid having throw rugs at the top or bottom of the stairs. If you do have throw rugs, attach them to the floor with carpet tape.  Make sure that you have a light switch at the top of the stairs and the bottom of the stairs. If you do not have them, ask someone to add them for you. What else can I do to help prevent falls?  Wear shoes that:  Do not have high heels.  Have rubber bottoms.  Are comfortable and fit you well.  Are closed at the toe. Do not wear sandals.  If you use a stepladder:  Make sure that it is fully opened. Do not climb a closed  stepladder.  Make sure that both sides of the stepladder are locked into place.  Ask someone to hold it for you, if possible.  Clearly mark and make sure that you can see:  Any grab bars or handrails.  First and last steps.  Where the edge of each step is.  Use tools that help you move around (mobility aids) if they are needed. These include:  Canes.  Walkers.  Scooters.  Crutches.  Turn on the lights when you go into a dark area. Replace any light bulbs as soon as they burn out.  Set up your furniture so you have a clear path. Avoid moving your furniture around.  If any of your floors are uneven, fix them.  If there are any pets around you, be aware of where they are.  Review your medicines with your doctor. Some medicines can make you feel dizzy. This can increase your chance of falling. Ask your doctor what other things that you can do to help prevent falls. This information is not intended to replace advice given to you by your health care provider. Make sure you discuss any questions you have with your health care provider. Document Released: 10/23/2008 Document Revised: 06/04/2015 Document Reviewed: 01/31/2014 Elsevier Interactive Patient Education  2017 [employer].

## 2020-01-17 DIAGNOSIS — H401133 Primary open-angle glaucoma, bilateral, severe stage: Secondary | ICD-10-CM | POA: Diagnosis not present

## 2020-01-27 ENCOUNTER — Ambulatory Visit: Payer: PPO | Admitting: Dermatology

## 2020-01-28 ENCOUNTER — Other Ambulatory Visit: Payer: Self-pay | Admitting: Urology

## 2020-02-25 ENCOUNTER — Other Ambulatory Visit: Payer: Self-pay

## 2020-02-25 DIAGNOSIS — G8929 Other chronic pain: Secondary | ICD-10-CM

## 2020-02-25 MED ORDER — BACLOFEN 10 MG PO TABS: 5.0000 mg | ORAL_TABLET | Freq: Three times a day (TID) | ORAL | 3 refills | Status: DC | PRN

## 2020-02-26 ENCOUNTER — Other Ambulatory Visit: Payer: Self-pay | Admitting: Cardiovascular Disease

## 2020-03-03 ENCOUNTER — Encounter: Payer: Self-pay | Admitting: Gastroenterology

## 2020-03-03 ENCOUNTER — Ambulatory Visit: Payer: PPO | Admitting: Gastroenterology

## 2020-03-03 ENCOUNTER — Other Ambulatory Visit: Payer: Self-pay

## 2020-03-03 VITALS — BP 131/72 | HR 76 | Ht 73.0 in | Wt 148.4 lb

## 2020-03-03 DIAGNOSIS — R1310 Dysphagia, unspecified: Secondary | ICD-10-CM

## 2020-03-03 NOTE — Progress Notes (Signed)
Primary Care Physician: [name], DO  Primary Gastroenterologist:  Dr. [name]  Chief Complaint  Patient presents with  . Dysphagia    HPI: [name] is a 87 y.o. male here for continued dysphagia.  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  The patient's wife states that the patient is having dysphagia to both liquids and solids.  He has lost some weight recently from the dysphagia.  There is no report of any black stools or bloody stools. The patient also denies any abdominal pain.  Past Medical History:  Diagnosis Date  . Arthritis   . Coronary artery disease    a. 1993 Darrell PCI/BMS LAD;  b. 2003 PCI RCA;  c. 09/2006 ISR LAD->PCI, PCI RCA;  d. 02/2007 MV: no ischemia;  d. 01/2012 MV: EF 74%, no ischemia.  [name] History of prostate cancer   . Hyperlipidemia   . Hyperthyroidism   . Low testosterone   . PAF (paroxysmal atrial fibrillation) ([name])    a. previously on amio ->stopped in 2013;  b. recurrent PAF 01/2012->Pradaxa and amio initiated, subsequently came off of pradaxa 2/2 cost;  c. 02/2012 Echo: EF 55-60%, Gr 1 DD.  [name] Prostate cancer ([place])   . [name]     Current Outpatient Medications  Medication Sig Dispense Refill  . Acetaminophen (TYLENOL ARTHRITIS PAIN PO) Take by mouth as needed.    [name] amLODipine (NORVASC) 5 MG tablet Take 1 tablet (5 mg total) by mouth daily. 90 tablet 3  . aspirin EC 81 MG tablet Take 81 mg by mouth every evening.    . baclofen (LIORESAL) 10 MG tablet Take 0.5-1 tablets (5-10 mg total) by mouth 3 (three) times daily as needed for muscle spasms. 30 each 3  . bisacodyl (DULCOLAX) 10 MG suppository Place 10 mg rectally daily as needed for moderate constipation.    . Cholecalciferol (VITAMIN D3) 1.25 MG (50000 UT) CAPS Take by mouth daily.    . dorzolamide-timolol (COSOPT) 22.3-6.8 MG/ML ophthalmic solution 1 drop 2 (two) times daily.    [name]  ezetimibe (ZETIA) 10 MG tablet TAKE ONE TABLET BY MOUTH DAILY . PLEASE CALL [phone] TO SCHEDULE APPOINTMENT FOR FURTHER REFILLS 30 tablet 5  . fluticasone (FLONASE) 50 MCG/ACT nasal spray Place 2 sprays into both nostrils daily. Use for [name]-6 weeks then stop and use seasonally or as needed. 16 g 3  . hydrocortisone 2.5 % cream APPLY A THIN LAYER TO FACE TWICE A DAY AS NEEDED FOR 1 TO 2 WEEKS 30 g 1  . latanoprost (XALATAN) 0.005 % ophthalmic solution 0.005 drops daily.    . meloxicam (MOBIC) 15 MG tablet Take 15 mg by mouth daily.    . polyethylene glycol powder (GLYCOLAX/MIRALAX) 17 GM/SCOOP powder Take 17-34 g by mouth daily. For constipation 765 g 5  . simvastatin (ZOCOR) 20 MG tablet     . testosterone cypionate (DEPOTESTOSTERONE CYPIONATE) 200 MG/ML injection INJECT 0.3ML INTO THE MUSCLE EVERY 14 DAYS 2 mL 2  . timolol (TIMOPTIC) 0.5 % ophthalmic solution Place 1 drop into both eyes daily.      No current facility-administered medications for this visit.    Allergies as of 03/03/2020 - Review Complete 03/03/2020  Allergen Reaction Noted  . Ambien [zolpidem tartrate] Other (See Comments) 01/25/2012  . Pravastatin sodium  12/02/2013  . Rosuvastatin calcium  05/19/2014  .  Statins    . Zolpidem tartrate  10/05/2010    ROS:  General: Negative for anorexia, weight loss, fever, chills, fatigue, weakness. ENT: Negative for hoarseness, difficulty swallowing , nasal congestion. CV: Negative for chest pain, angina, palpitations, dyspnea on exertion, peripheral edema.  Respiratory: Negative for dyspnea at rest, dyspnea on exertion, cough, sputum, wheezing.  GI: See history of present illness. GU:  Negative for dysuria, hematuria, urinary incontinence, urinary frequency, nocturnal urination.  Endo: Negative for unusual weight change.    Physical Examination:   BP 131/72   Pulse 76   Ht 6\' 1"  (1.854 m)   Wt 148 lb 6.[name] oz (67.3 kg)   BMI 19.58 kg/m   General: Well-nourished,  well-developed in no acute distress.  Eyes: No icterus. Conjunctivae pink. Lungs: Clear to auscultation bilaterally. Non-labored. Heart: Regular rate and rhythm, no murmurs rubs or gallops.  Abdomen: Bowel sounds are normal, nontender, nondistended, no hepatosplenomegaly or masses, no abdominal bruits or hernia , no rebound or guarding.   Extremities: No lower extremity edema. No clubbing or deformities. Neuro: Alert and oriented x 3.  Grossly intact. Skin: Warm and dry, no jaundice.   Psych: Alert and cooperative, normal mood and affect.  Labs:    Imaging Studies: No results found.  Assessment and Plan:   [name] is a 87 y.o. y/o male who comes in today with continued dysphagia.  The patient is having dysphagia to both liquids and solids a report that he did not get any relief from his previous dilation of his esophagus.  The patient will be set up for modified barium swallow since he appears to cough and choke on both liquids and solids which are indicative of a possible motility disorder versus oropharyngeal dysphagia. The patient and his wife have been explained the plan and agree with it.     [name], MD. Marval Regal    Note: This dictation was prepared with Dragon dictation along with smaller phrase technology. Any transcriptional errors that result from this process are unintentional.

## 2020-03-05 ENCOUNTER — Telehealth: Payer: Self-pay | Admitting: Gastroenterology

## 2020-03-05 NOTE — Telephone Encounter (Signed)
Pt's wife notified to contact special procedures at [facility] to schedule.

## 2020-03-05 NOTE — Telephone Encounter (Signed)
Patient's wife called, wants to know when hospital is to call to schedule his swallowing test. Please call to advise

## 2020-03-13 DIAGNOSIS — H401133 Primary open-angle glaucoma, bilateral, severe stage: Secondary | ICD-10-CM | POA: Diagnosis not present

## 2020-03-16 ENCOUNTER — Other Ambulatory Visit: Payer: Self-pay

## 2020-03-16 ENCOUNTER — Ambulatory Visit
Admission: RE | Admit: 2020-03-16 | Discharge: 2020-03-16 | Disposition: A | Discharge status: Home / Self Care | Payer: PPO | Source: Ambulatory Visit | Attending: Gastroenterology | Admitting: Gastroenterology

## 2020-03-16 DIAGNOSIS — R1310 Dysphagia, unspecified: Secondary | ICD-10-CM | POA: Insufficient documentation

## 2020-03-16 DIAGNOSIS — K219 Gastro-esophageal reflux disease without esophagitis: Secondary | ICD-10-CM | POA: Diagnosis not present

## 2020-03-16 DIAGNOSIS — R059 Cough, unspecified: Secondary | ICD-10-CM | POA: Diagnosis not present

## 2020-03-16 NOTE — Therapy (Signed)
[name], [name], [address] Phone: [phone]   Fax:     Modified Barium Swallow  Patient Details  Name: [name] MRN: [id number] Date of Birth: 1932-03-04 No data recorded  Encounter Date: 03/16/2020   End of Session - 03/16/20 1503    Visit Number 1    Number of Visits 1    Date for SLP Re-Evaluation 03/16/20    SLP Start Time 8413    SLP Stop Time  1330    SLP Time Calculation (min) 35 min    Activity Tolerance Patient tolerated treatment well             There were no vitals filed for this visit.   Subjective Assessment - 03/16/20 1423    Subjective Coughs and feels foods and liquids "sticking" in throat    Patient is accompained by: Family member   wife [name], assists with history   Currently in Pain? No/denies             Objective Swallowing Evaluation: Type of Study: MBS-Modified Barium Swallow Study   Patient Details  Name: [name] MRN: [id number] Date of Birth: [birth year]  Today's Date: 03/16/2020 Time: SLP Start Time (ACUTE ONLY): 5366 -SLP Stop Time (ACUTE ONLY): 1330  SLP Time Calculation (min) (ACUTE ONLY): 35 min   Past Medical History:  Past Medical History:  Diagnosis Date  . Arthritis   . Coronary artery disease    a. 1993 s/p PCI/BMS LAD;  b. 2003 PCI RCA;  c. 09/2006 ISR LAD->PCI, PCI RCA;  d. 02/2007 MV: no ischemia;  d. 01/2012 MV: EF 74%, no ischemia.  [name] History of prostate cancer   . Hyperlipidemia   . Hyperthyroidism   . Low testosterone   . PAF (paroxysmal atrial fibrillation) ([place])    a. previously on amio ->stopped in 2013;  b. recurrent PAF 01/2012->Pradaxa and amio initiated, subsequently came off of pradaxa 2/2 cost;  c. 02/2012 Echo: EF 55-60%, Gr 1 DD.  [name] Prostate cancer ([place])   . S/P CABG x 4    Past Surgical History:  Past Surgical History:  Procedure Laterality Date  . APPENDECTOMY     Dr. Bary Castilla  . CARDIAC  CATHETERIZATION  2008  . CLIPPING OF ATRIAL APPENDAGE Left 05/18/2012   Procedure: CLIPPING OF ATRIAL APPENDAGE;  Surgeon: [name], MD;  Location: [facility];  Service: Open Heart Surgery;  Laterality: Left;  . COLONOSCOPY  2013  . COLONOSCOPY WITH PROPOFOL N/A 07/12/2016   Procedure: COLONOSCOPY WITH PROPOFOL;  Surgeon: [name], MD;  Location[facility] END[facility]COPY;  Service: Endoscopy;  Laterality: N/A;  . CORONARY ARTERY BYPASS GRAFT N/A 05/18/2012   Procedure: CORONARY ARTERY BYPASS GRAFTING (CABG);  Surgeon: [name], MD;  Location: [facility];  Service: Open Heart Surgery;  Laterality: N/A;  Times 4 using left internal mammary artery and endoscopically harvested right saphenous vein  . ESOPHAGOGASTRODUODENOSCOPY (EGD) WITH PROPOFOL N/A 10/24/2017   Procedure: ESOPHAGOGASTRODUODENOSCOPY (EGD) WITH PROPOFOL;  Surgeon: [name], MD;  Location: [facility] ENDOSCOPY;  Service: Endoscopy;  Laterality: N/A;  . HERNIA REPAIR     s/p mesh bilaterally, Dr. Bary Castilla  . INTRAOPERATIVE TRANSESOPHAGEAL ECHOCARDIOGRAM N/A 05/18/2012   Procedure: INTRAOPERATIVE TRANSESOPHAGEAL ECHOCARDIOGRAM;  Surgeon: [name], MD;  Location: [facility];  Service: Open Heart Surgery;  Laterality: N/A;  . IR ESOPHAGUS DILITATION RETRO FLUORO    . LEFT HEART CATHETERIZATION WITH CORONARY ANGIOGRAM Bilateral 05/16/2012  Procedure: LEFT HEART CATHETERIZATION WITH CORONARY ANGIOGRAM;  Surgeon: Burnell Blanks, MD;  Location: [facility] CATH LAB;  Service: Cardiovascular;  Laterality: Bilateral;  . MAZE N/A 05/18/2012   Procedure: MAZE;  Surgeon: [name], MD;  Location: [facility];  Service: Open Heart Surgery;  Laterality: N/A;  . MOHS SURGERY    . PROSTATECTOMY    . TOTAL HIP ARTHROPLASTY Right    Dr. [name]   HPI: [name] is an 87 y.o. male referred by Dr. Allen Norris for dysphagia. Patient reports swallowing difficulties worsening over the past 10 years. Had esophageal dilation in 2019 which did not really improve things for him. Has  lost 15-20 lbs over the last 6 months; weight does fluctuate per pt's wife. He has not had recent pneumonia. Most recent chest xray was in January 2021 and showed no active disease. Past medical history is noted for CAD, prostate cancer, hyperthyroidism, s/p CABG x4. CT soft tissue neck in 2016 is noted for "anterior vertebral ossification from C4 to T1." He had barium swallow 09/15/2017 with findings of mild narrowing of the distal esophagus, mild tertiary contractions of the distal third of the esophagus with mild spasm.   No data recorded   Assessment / Plan / Recommendation  CHL IP CLINICAL IMPRESSIONS 03/16/2020  Clinical Impression Patient presents with mild-moderate structural pharyngeal and pharyngoesophageal dysphagia. Oral stage is characterized by adequate containment, rotary mastication, anterior to posterior transfer and oral clearance. Swallow initiation is timely at the level of the base of tongue. Patient has adequate base of tongue retraction, hyolaryngeal excursion, and pharyngeal constriction. Curved epiglottis abuts the posterior pharyngeal Iyengar (osseous anterior cervical changes are noted on prior imaging) and does not invert during the swallow, which results in moderate-severe residue solids (severe) >liquids (mild-moderate). Despite lack of epiglottic inversion/ closure of the larygeal vestibule, patient has full airway protection. There was no observed penetration or aspiration on this exam. Compensatory/positioning manuevers were attempted in an attempt to facilitate epiglottic clearance of the pharyngeal Buening, but none of these was effective. Barium tablet lodged in the valleculae and could not be transitted even with consecutive boluses of nectar liquid and puree. Pt was able to "hock" to regurgitate the tablet and expectorate. Most effective technique to reduce solid residue was liquid wash with multiple hard swallows. Cervical esophageal phase is noted for prominent cricopharyngeus  with reduced amplitude of opening. This contributes to mild residue (pyriform sinuses, posterior pharyngeal Tatem) intermittently due to decreased clearance, however patient senses this and clears independently with second swallow. Patient and his wife were educated regarding findings of the examination and advised that as pt's strength and range of motion are functional, and dysphagia appears structural in nature, unlikely that he would benefit from swallowing exercises. Educated on strategies to minimize symptoms of chronic dysphagia, such as selecting softer foods, moistening dry foods with sauce or gravy, alternating solids and liquids, using "hock" to regurgitate solids when experiencing discomfort and at the end of meals, and consulting MD/pharmacist on whether pt's medications can be crushed with puree. Although pt at mild risk for aspiration, he may be at increased risk during periods of illness or decompensation.  SLP Visit Diagnosis Dysphagia, pharyngeal phase (R13.13);Dysphagia, pharyngoesophageal phase (R13.14)  Attention and concentration deficit following --  Frontal lobe and executive function deficit following --  Impact on safety and function Mild aspiration risk      CHL IP TREATMENT RECOMMENDATION 03/16/2020  Treatment Recommendations No treatment recommended at this time  Prognosis 03/16/2020  Prognosis for Safe Diet Advancement Fair  Barriers to Reach Goals Time post onset;Other (Comment)  Barriers/Prognosis Comment --    CHL IP DIET RECOMMENDATION 03/16/2020  SLP Diet Recommendations Regular solids;Dysphagia 3 (Mech soft) solids;Thin liquid  Liquid Administration via Cup  Medication Administration Crushed with puree  Compensations Slow rate;Small sips/bites;Follow solids with liquid;Other (Comment)  Postural Changes Remain semi-upright after after feeds/meals (Comment);Seated upright at 90 degrees      CHL IP OTHER RECOMMENDATIONS 03/16/2020  Recommended Consults --  Oral  Care Recommendations Oral care BID  Other Recommendations --      CHL IP FOLLOW UP RECOMMENDATIONS 03/16/2020  Follow up Recommendations None      No flowsheet data found.         CHL IP ORAL PHASE 03/16/2020  Oral Phase WFL  Oral - Pudding Teaspoon --  Oral - Pudding Cup --  Oral - Honey Teaspoon --  Oral - Honey Cup --  Oral - Nectar Teaspoon --  Oral - Nectar Cup --  Oral - Nectar Straw --  Oral - Thin Teaspoon --  Oral - Thin Cup --  Oral - Thin Straw --  Oral - Puree --  Oral - Mech Soft --  Oral - Regular --  Oral - Multi-Consistency --  Oral - Pill --  Oral Phase - Comment --    CHL IP PHARYNGEAL PHASE 03/16/2020  Pharyngeal Phase Impaired  Pharyngeal- Pudding Teaspoon --  Pharyngeal --  Pharyngeal- Pudding Cup --  Pharyngeal --  Pharyngeal- Honey Teaspoon --  Pharyngeal --  Pharyngeal- Honey Cup --  Pharyngeal --  Pharyngeal- Nectar Teaspoon --  Pharyngeal --  Pharyngeal- Nectar Cup Reduced epiglottic inversion;Reduced airway/laryngeal closure;Pharyngeal residue - valleculae;Pharyngeal residue - pyriform  Pharyngeal Material does not enter airway  Pharyngeal- Nectar Straw --  Pharyngeal --  Pharyngeal- Thin Teaspoon --  Pharyngeal --  Pharyngeal- Thin Cup Reduced epiglottic inversion;Reduced airway/laryngeal closure;Pharyngeal residue - valleculae;Pharyngeal residue - pyriform  Pharyngeal Material does not enter airway  Pharyngeal- Thin Straw --  Pharyngeal --  Pharyngeal- Puree Reduced epiglottic inversion;Reduced airway/laryngeal closure;Pharyngeal residue - valleculae  Pharyngeal Material does not enter airway  Pharyngeal- Mechanical Soft --  Pharyngeal --  Pharyngeal- Regular Reduced epiglottic inversion;Reduced airway/laryngeal closure;Pharyngeal residue - valleculae  Pharyngeal Material does not enter airway  Pharyngeal- Multi-consistency --  Pharyngeal --  Pharyngeal- Pill Reduced epiglottic inversion;Reduced airway/laryngeal closure;Pharyngeal  residue - valleculae  Pharyngeal Material does not enter airway;Other (Comment)  Pharyngeal Comment --     CHL IP CERVICAL ESOPHAGEAL PHASE 03/16/2020  Cervical Esophageal Phase Impaired  Pudding Teaspoon --  Pudding Cup --  Honey Teaspoon --  Honey Cup --  Nectar Teaspoon --  Nectar Cup --  Nectar Straw --  Thin Teaspoon --  Thin Cup --  Thin Straw --  Puree --  Mechanical Soft --  Regular --  Multi-consistency --  Pill --  Cervical Esophageal Comment Prominent cricopharyngeus with reduced amplitude of opening; for larger boluses of liquids pt must swallow twice for stripping from the posterior pharyngeal [place], pyriform sinuses (residual remains in valleculae).     Aliene Altes 03/16/2020, 3:04 PM                                                Dysphagia, unspecified type - Plan: DG SWALLOW FUNC OP MEDICARE  SPEECH PATH, DG SWALLOW FUNC OP MEDICARE SPEECH PATH        Problem List Patient Active Problem List   Diagnosis Date Noted  . Osteoarthritis of lumbar spine 05/30/2019  . Paroxysmal atrial fibrillation ([place]) 05/17/2019  . Chronic right-sided low back pain without sciatica 11/16/2018  . Gastroesophageal reflux disease without esophagitis 12/19/2017  . Esophageal dysphagia   . Stricture and stenosis of esophagus   . Mild dementia ([place]) 06/07/2017  . Primary osteoarthritis involving multiple joints 09/06/2016  . Benign neoplasm of ascending colon   . AAA (abdominal aortic aneurysm) without rupture ([place]) 04/17/2015  . Constipation 09/23/2014  . Chronic back pain 04/02/2014  . Hardening of the aorta (main artery of the heart) ([place]) 04/02/2014  . ED (erectile dysfunction) of organic origin 02/19/2013  . Pre-diabetes 02/19/2013  . Increased glucose level 02/19/2013  . History of coronary artery bypass surgery 05/23/2012  . S/P Maze operation for atrial fibrillation 05/23/2012  . Postprocedural state 05/23/2012  . Personal history  of prostate cancer 09/06/2011  . Male urinary stress incontinence 09/06/2011  . Testicular hypofunction 06/22/2011  . Insomnia 10/05/2010  . Hyperlipidemia 06/17/2009  . Benign hypertension with CKD (chronic kidney disease) stage III ([place]) 06/17/2009  . Coronary arteriosclerosis in native artery 06/17/2009  . History of atrial fibrillation 06/17/2009  . Benign essential hypertension 06/17/2009   Deneise Lever, [name], [place] 03/16/2020, 3:03 PM  [facility] [place], [name], [address] Phone: [phone]   Fax:     Name: MICHAIL BOYTE MRN: [id number] Date of Birth: August 26, 1932

## 2020-03-18 ENCOUNTER — Other Ambulatory Visit: Payer: Self-pay

## 2020-03-18 ENCOUNTER — Telehealth: Payer: Self-pay

## 2020-03-18 NOTE — Telephone Encounter (Signed)
-----   Message from [name], MD sent at 03/17/2020  7:19 AM EST ----- Please have the patient come in for a follow up.

## 2020-03-18 NOTE — Telephone Encounter (Signed)
Pt's wife returned my call and was scheduled for a follow up appt on 03/26/20.

## 2020-03-18 NOTE — Telephone Encounter (Signed)
LVM for pt to return my call.

## 2020-03-26 ENCOUNTER — Ambulatory Visit: Payer: PPO | Admitting: Gastroenterology

## 2020-03-26 ENCOUNTER — Encounter: Payer: Self-pay | Admitting: Gastroenterology

## 2020-03-26 ENCOUNTER — Other Ambulatory Visit: Payer: Self-pay

## 2020-03-26 VITALS — BP 109/72 | HR 75 | Ht 73.0 in | Wt 150.4 lb

## 2020-03-26 DIAGNOSIS — R1313 Dysphagia, pharyngeal phase: Secondary | ICD-10-CM | POA: Diagnosis not present

## 2020-03-26 NOTE — Progress Notes (Signed)
Primary Care Physician: [name], DO  Primary Gastroenterologist:  Dr. [name]  Chief Complaint  Patient presents with  . Follow up dysphagia    Barium swallow results    HPI: [name] is a 87 y.o. male here for follow-up for dysphagia.  Patient had an upper endoscopy examination intact that he did not feel any difference after that.  The patient was sent for a modified barium swallow that showed:  Cervical esophageal phase is noted for prominent cricopharyngeus with reduced amplitude of opening. This contributes to mild residue (pyriform sinuses, posterior pharyngeal Wynn) intermittently due to decreased clearance, however patient senses this and clears independently with second swallow.  The patient reports that his gain some weight with using the modifications he was told by speech pathology.  He is drinking after he swallows to chase the food down.  He has been doing very well without any complaints.  Past Medical History:  Diagnosis Date  . Arthritis   . Coronary artery disease    a. 1993 s/p PCI/BMS LAD;  b. 2003 PCI RCA;  c. 09/2006 ISR LAD->PCI, PCI RCA;  d. 02/2007 MV: no ischemia;  d. 01/2012 MV: EF 74%, no ischemia.  [name] History of prostate cancer   . Hyperlipidemia   . Hyperthyroidism   . Low testosterone   . PAF (paroxysmal atrial fibrillation) ([place])    a. previously on amio ->stopped in 2013;  b. recurrent PAF 01/2012->Pradaxa and amio initiated, subsequently came off of pradaxa 2/2 cost;  c. 02/2012 Echo: EF 55-60%, Gr 1 DD.  [name] Prostate cancer ([place])   . S/P CABG x 4     Current Outpatient Medications  Medication Sig Dispense Refill  . Acetaminophen (TYLENOL ARTHRITIS PAIN PO) Take by mouth as needed.    [name] amLODipine (NORVASC) 5 MG tablet Take 1 tablet (5 mg total) by mouth daily. 90 tablet 3  . aspirin EC 81 MG tablet Take 81 mg by mouth every evening.    . baclofen (LIORESAL) 10 MG tablet Take 0.5-1 tablets (5-10 mg total) by mouth 3  (three) times daily as needed for muscle spasms. 30 each 3  . bisacodyl (DULCOLAX) 10 MG suppository Place 10 mg rectally daily as needed for moderate constipation.    . Cholecalciferol (VITAMIN D3) 1.25 MG (50000 UT) CAPS Take by mouth daily.    . dorzolamide-timolol (COSOPT) 22.3-6.8 MG/ML ophthalmic solution 1 drop 2 (two) times daily.    [name] ezetimibe (ZETIA) 10 MG tablet TAKE ONE TABLET BY MOUTH DAILY . PLEASE CALL [phone] TO SCHEDULE APPOINTMENT FOR FURTHER REFILLS 30 tablet 5  . fluticasone (FLONASE) 50 MCG/ACT nasal spray Place 2 sprays into both nostrils daily. Use for 4-6 weeks then stop and use seasonally or as needed. 16 g 3  . hydrocortisone 2.5 % cream APPLY A THIN LAYER TO FACE TWICE A DAY AS NEEDED FOR 1 TO 2 WEEKS 30 g 1  . latanoprost (XALATAN) 0.005 % ophthalmic solution 0.005 drops daily.    . meloxicam (MOBIC) 15 MG tablet Take 15 mg by mouth daily.    . polyethylene glycol powder (GLYCOLAX/MIRALAX) 17 GM/SCOOP powder Take 17-34 g by mouth daily. For constipation 765 g 5  . simvastatin (ZOCOR) 20 MG tablet     . testosterone cypionate (DEPOTESTOSTERONE CYPIONATE) 200 MG/ML injection INJECT 0.3ML INTO THE MUSCLE EVERY 14 DAYS 2 mL 2  . timolol (TIMOPTIC) 0.5 % ophthalmic solution Place 1 drop into both eyes daily.  No current facility-administered medications for this visit.    Allergies as of 03/26/2020 - Review Complete 03/26/2020  Allergen Reaction Noted  . Ambien [zolpidem tartrate] Other (See Comments) 01/25/2012  . Pravastatin sodium  12/02/2013  . Rosuvastatin calcium  05/19/2014  . Statins    . Zolpidem tartrate  10/05/2010    ROS:  General: Negative for anorexia, weight loss, fever, chills, fatigue, weakness. ENT: Negative for hoarseness, difficulty swallowing , nasal congestion. CV: Negative for chest pain, angina, palpitations, dyspnea on exertion, peripheral edema.  Respiratory: Negative for dyspnea at rest, dyspnea on exertion, cough, sputum,  wheezing.  GI: See history of present illness. GU:  Negative for dysuria, hematuria, urinary incontinence, urinary frequency, nocturnal urination.  Endo: Negative for unusual weight change.    Physical Examination:   BP 109/72   Pulse 75   Ht 6\' 1"  (1.854 m)   Wt 150 lb 6.4 oz (68.2 kg)   BMI 19.84 kg/m   General: Well-nourished, well-developed in no acute distress.  Eyes: No icterus. Conjunctivae pink. Neuro: Alert and oriented x 3.  Grossly intact. Skin: Warm and dry, no jaundice.   Psych: Alert and cooperative, normal mood and affect.  Labs:    Imaging Studies: [facility] MEDICARE SPEECH PATH  Result Date: 03/16/2020 CLINICAL DATA:  Dysphagia. Cough/GE reflux disease/other secondary diagnosis EXAM: MODIFIED BARIUM SWALLOW TECHNIQUE: Different consistencies of barium were administered orally to the patient by the Speech Pathologist. Imaging of the pharynx was performed in the lateral projection. The radiologist was present in the fluoroscopy room for this study, providing personal supervision. FLUOROSCOPY TIME:  Fluoroscopy Time:  2 minutes 24 seconds Radiation Exposure Index (if provided by the fluoroscopic device): 2.7 mGy Number of Acquired Spot Images: 0 COMPARISON:  None. FINDINGS: Real-time fluoroscopy of the swallowing function was performed with a speech pathologist present. Multiple consistencies of barium were administered which included water, nectar, applesauce, barium tablet, and Graham cracker. No laryngeal penetration or tracheal aspiration. Significant vallecular residuals throughout the exam. The barium tablet becomes lodged in the vallecula which the patient is able to easily regurgitate. IMPRESSION: Modified barium swallow as described above. Please refer to the Speech Pathologists report for complete details and recommendations. Electronically Signed   By: Kathreen Devoid   On: 03/16/2020 13:58 Objective Swallowing Evaluation: Type of Study: MBS-Modified Barium  Swallow Study  Patient Details Name: [name] MRN: [id number] Date of Birth: April 09, 1932 Today's Date: 03/16/2020 Time: SLP Start Time (ACUTE ONLY): 1194 -SLP Stop Time (ACUTE ONLY): 1330 SLP Time Calculation (min) (ACUTE ONLY): 35 min Past Medical History: Past Medical History: Diagnosis Date . Arthritis  . Coronary artery disease   a. 1993 s/p PCI/BMS LAD;  b. 2003 PCI RCA;  c. 09/2006 ISR LAD->PCI, PCI RCA;  d. 02/2007 MV: no ischemia;  d. 01/2012 MV: EF 74%, no ischemia. [name] History of prostate cancer  . Hyperlipidemia  . Hyperthyroidism  . Low testosterone  . PAF (paroxysmal atrial fibrillation) ([place])   a. previously on amio ->stopped in 2013;  b. recurrent PAF 01/2012->Pradaxa and amio initiated, subsequently came off of pradaxa 2/2 cost;  c. 02/2012 Echo: EF 55-60%, Gr 1 DD. [name] Prostate cancer ([place])  . S/P CABG x 4  Past Surgical History: Past Surgical History: Procedure Laterality Date . APPENDECTOMY    Dr. Bary Castilla . CARDIAC CATHETERIZATION  2008 . CLIPPING OF ATRIAL APPENDAGE Left 05/18/2012  Procedure: CLIPPING OF ATRIAL APPENDAGE;  Surgeon: [name], MD;  Location: [facility];  Service: Open Heart Surgery;  Laterality: Left; . COLONOSCOPY  2013 . COLONOSCOPY WITH PROPOFOL N/A 07/12/2016  Procedure: COLONOSCOPY WITH PROPOFOL;  Surgeon: [name], MD;  [facility] ENDOSCOPY;  Service: Endoscopy;  Laterality: N/A; . CORONARY ARTERY BYPASS GRAFT N/A 05/18/2012  Procedure: CORONARY ARTERY BYPASS GRAFTING (CABG);  Surgeon: [name], MD;  Location: [place];  Service: Open Heart Surgery;  Laterality: N/A;  Times 4 using left internal mammary artery and endoscopically harvested right saphenous vein . ESOPHAGOGASTRODUODENOSCOPY (EGD) WITH PROPOFOL N/A 10/24/2017  Procedure: ESOPHAGOGASTRODUODENOSCOPY (EGD) WITH PROPOFOL;  Surgeon: [name], MD;  Location: [facility] ENDOSCOPY;  Service: Endoscopy;  Laterality: N/A; . HERNIA REPAIR    s/p mesh bilaterally, Dr. Bary Castilla . INTRAOPERATIVE TRANSESOPHAGEAL ECHOCARDIOGRAM  N/A 05/18/2012  Procedure: INTRAOPERATIVE TRANSESOPHAGEAL ECHOCARDIOGRAM;  Surgeon: [name], MD;  Location: [place];  Service: Open Heart Surgery;  Laterality: N/A; . IR ESOPHAGUS DILITATION RETRO FLUORO   . LEFT HEART CATHETERIZATION WITH CORONARY ANGIOGRAM Bilateral 05/16/2012  Procedure: LEFT HEART CATHETERIZATION WITH CORONARY ANGIOGRAM;  Surgeon: Burnell Blanks, MD;  Location[facility] C[facility] LAB;  Service: Cardiovascular;  Laterality: Bilateral; . MAZE N/A 05/18/2012  Procedure: MAZE;  Surgeon: [name], MD;  Location: [place];  Service: Open Heart Surgery;  Laterality: N/A; . MOHS SURGERY   . PROSTATECTOMY   . TOTAL HIP ARTHROPLASTY Right   Dr. [name] HPI: Mr. Kienast is an 87 y.o. male referred by Dr. Allen Norris for dysphagia. Patient reports swallowing difficulties worsening over the past 10 years. Had esophageal dilation in 2019 which did not really improve things for him. Has lost 15-20 lbs over the last 6 months; weight does fluctuate per pt's wife. He has not had recent pneumonia. Most recent chest xray was in January 2021 and showed no active disease. Past medical history is noted for CAD, prostate cancer, hyperthyroidism, s/p CABG x4. CT soft tissue neck in 2016 is noted for "anterior vertebral ossification from C4 to T1." He had barium swallow 09/15/2017 with findings of mild narrowing of the distal esophagus, mild tertiary contractions of the distal third of the esophagus with mild spasm.  No data recorded Assessment / Plan / Recommendation CHL IP CLINICAL IMPRESSIONS 03/16/2020 Clinical Impression Patient presents with mild-moderate structural pharyngeal and pharyngoesophageal dysphagia. Oral stage is characterized by adequate containment, rotary mastication, anterior to posterior transfer and oral clearance. Swallow initiation is timely at the level of the base of tongue. Patient has adequate base of tongue retraction, hyolaryngeal excursion, and pharyngeal constriction. Curved epiglottis abuts the  posterior pharyngeal Deller (osseous anterior cervical changes are noted on prior imaging) and does not invert during the swallow, which results in moderate-severe residue solids (severe) >liquids (mild-moderate). Despite lack of epiglottic inversion/ closure of the larygeal vestibule, patient has full airway protection. There was no observed penetration or aspiration on this exam. Compensatory/positioning manuevers were attempted in an attempt to facilitate epiglottic clearance of the pharyngeal Steinhoff, but none of these was effective. Barium tablet lodged in the valleculae and could not be transitted even with consecutive boluses of nectar liquid and puree. Pt was able to "hock" to regurgitate the tablet and expectorate. Most effective technique to reduce solid residue was liquid wash with multiple hard swallows. Cervical esophageal phase is noted for prominent cricopharyngeus with reduced amplitude of opening. This contributes to mild residue (pyriform sinuses, posterior pharyngeal Kitchings) intermittently due to decreased clearance, however patient senses this and clears independently with second swallow. Patient and his wife were educated regarding findings of  the examination and advised that as pt's strength and range of motion are functional, and dysphagia appears structural in nature, unlikely that he would benefit from swallowing exercises. Educated on strategies to minimize symptoms of chronic dysphagia, such as selecting softer foods, moistening dry foods with sauce or gravy, alternating solids and liquids, using "hock" to regurgitate solids when experiencing discomfort and at the end of meals, and consulting MD/pharmacist on whether pt's medications can be crushed with puree. Although pt at mild risk for aspiration, he may be at increased risk during periods of illness or decompensation. SLP Visit Diagnosis Dysphagia, pharyngeal phase (R13.13);Dysphagia, pharyngoesophageal phase (R13.14) Attention and  concentration deficit following -- Frontal lobe and executive function deficit following -- Impact on safety and function Mild aspiration risk   CHL IP TREATMENT RECOMMENDATION 03/16/2020 Treatment Recommendations No treatment recommended at this time   Prognosis 03/16/2020 Prognosis for Safe Diet Advancement Fair Barriers to Reach Goals Time post onset;Other (Comment) Barriers/Prognosis Comment -- CHL IP DIET RECOMMENDATION 03/16/2020 SLP Diet Recommendations Regular solids;Dysphagia 3 (Mech soft) solids;Thin liquid Liquid Administration via Cup Medication Administration Crushed with puree Compensations Slow rate;Small sips/bites;Follow solids with liquid;Other (Comment) Postural Changes Remain semi-upright after after feeds/meals (Comment);Seated upright at 90 degrees   CHL IP OTHER RECOMMENDATIONS 03/16/2020 Recommended Consults -- Oral Care Recommendations Oral care BID Other Recommendations --   CHL IP FOLLOW UP RECOMMENDATIONS 03/16/2020 Follow up Recommendations None   No flowsheet data found.     CHL IP ORAL PHASE 03/16/2020 Oral Phase WFL Oral - Pudding Teaspoon -- Oral - Pudding Cup -- Oral - Honey Teaspoon -- Oral - Honey Cup -- Oral - Nectar Teaspoon -- Oral - Nectar Cup -- Oral - Nectar Straw -- Oral - Thin Teaspoon -- Oral - Thin Cup -- Oral - Thin Straw -- Oral - Puree -- Oral - Mech Soft -- Oral - Regular -- Oral - Multi-Consistency -- Oral - Pill -- Oral Phase - Comment --  CHL IP PHARYNGEAL PHASE 03/16/2020 Pharyngeal Phase Impaired Pharyngeal- Pudding Teaspoon -- Pharyngeal -- Pharyngeal- Pudding Cup -- Pharyngeal -- Pharyngeal- Honey Teaspoon -- Pharyngeal -- Pharyngeal- Honey Cup -- Pharyngeal -- Pharyngeal- Nectar Teaspoon -- Pharyngeal -- Pharyngeal- Nectar Cup Reduced epiglottic inversion;Reduced airway/laryngeal closure;Pharyngeal residue - valleculae;Pharyngeal residue - pyriform Pharyngeal Material does not enter airway Pharyngeal- Nectar Straw -- Pharyngeal -- Pharyngeal- Thin Teaspoon -- Pharyngeal --  Pharyngeal- Thin Cup Reduced epiglottic inversion;Reduced airway/laryngeal closure;Pharyngeal residue - valleculae;Pharyngeal residue - pyriform Pharyngeal Material does not enter airway Pharyngeal- Thin Straw -- Pharyngeal -- Pharyngeal- Puree Reduced epiglottic inversion;Reduced airway/laryngeal closure;Pharyngeal residue - valleculae Pharyngeal Material does not enter airway Pharyngeal- Mechanical Soft -- Pharyngeal -- Pharyngeal- Regular Reduced epiglottic inversion;Reduced airway/laryngeal closure;Pharyngeal residue - valleculae Pharyngeal Material does not enter airway Pharyngeal- Multi-consistency -- Pharyngeal -- Pharyngeal- Pill Reduced epiglottic inversion;Reduced airway/laryngeal closure;Pharyngeal residue - valleculae Pharyngeal Material does not enter airway;Other (Comment) Pharyngeal Comment --  CHL IP CERVICAL ESOPHAGEAL PHASE 03/16/2020 Cervical Esophageal Phase Impaired Pudding Teaspoon -- Pudding Cup -- Honey Teaspoon -- Honey Cup -- Nectar Teaspoon -- Nectar Cup -- Nectar Straw -- Thin Teaspoon -- Thin Cup -- Thin Straw -- Puree -- Mechanical Soft -- Regular -- Multi-consistency -- Pill -- Cervical Esophageal Comment Prominent cricopharyngeus with reduced amplitude of opening; for larger boluses of liquids pt must swallow twice for stripping from the posterior pharyngeal Vidovich, pyriform sinuses (residual remains in valleculae). Deneise Lever, [place], [place] Speech-Language Pathologist Aliene Altes 03/16/2020, 3:06 PM  Assessment and Plan:   [name] is a 87 y.o. y/o male who comes in today with a history of dysphagia.  The patient had a abnormal modified barium swallow and speech pathology had given him recommendations to better help him deal with his swallowing issues.  The patient has been doing well and states he has gained a few pounds using these modifications.  The patient will follow up as needed.     [name], MD. Marval Regal    Note: This dictation was prepared  with Dragon dictation along with smaller phrase technology. Any transcriptional errors that result from this process are unintentional.

## 2020-04-02 ENCOUNTER — Encounter: Payer: Self-pay | Admitting: Family Medicine

## 2020-04-02 ENCOUNTER — Ambulatory Visit (INDEPENDENT_AMBULATORY_CARE_PROVIDER_SITE_OTHER): Payer: PPO | Admitting: Family Medicine

## 2020-04-02 ENCOUNTER — Other Ambulatory Visit: Payer: Self-pay

## 2020-04-02 VITALS — BP 122/63 | HR 61 | Temp 97.7°F | Ht 73.0 in | Wt 148.0 lb

## 2020-04-02 DIAGNOSIS — E440 Moderate protein-calorie malnutrition: Secondary | ICD-10-CM | POA: Diagnosis not present

## 2020-04-02 DIAGNOSIS — F5104 Psychophysiologic insomnia: Secondary | ICD-10-CM

## 2020-04-02 DIAGNOSIS — F0390 Unspecified dementia without behavioral disturbance: Secondary | ICD-10-CM | POA: Diagnosis not present

## 2020-04-02 DIAGNOSIS — R7303 Prediabetes: Secondary | ICD-10-CM | POA: Diagnosis not present

## 2020-04-02 DIAGNOSIS — F03A0 Unspecified dementia, mild, without behavioral disturbance, psychotic disturbance, mood disturbance, and anxiety: Secondary | ICD-10-CM

## 2020-04-02 LAB — POCT GLYCOSYLATED HEMOGLOBIN (HGB A1C): Hemoglobin A1C: 5.6 % (ref 4.0–5.6)

## 2020-04-02 MED ORDER — MIRTAZAPINE 15 MG PO TABS: 15.0000 mg | ORAL_TABLET | Freq: Every day | ORAL | 2 refills | Status: DC

## 2020-04-02 NOTE — Patient Instructions (Addendum)
Thank you for coming to the office today.  Call pharmacy to discontinue Baclofen.  Start new medication Mirtazapine (generic of Remeron) 15mg  nightly every night about 30 min before bed for help with sleep/insomnia, can help mood, and also mainly helps increase appetite and promote weight gain  Recent Labs    05/13/19 0852 12/24/19 0814 04/02/20 1028  HGBA1C 5.7* 6.2* 5.6    Keep working on improving nutrition and exercise with higher impact strengthening.   Please schedule a Follow-up Appointment to: Return in about 4 months (around 08/02/2020) for 4 month PreDM A1c.  If you have any other questions or concerns, please feel free to call the office or send a message through [place]. You may also schedule an earlier appointment if necessary.  Additionally, you may be receiving a survey about your experience at our office within a few days to 1 week by e-mail or mail. We value your feedback.  [name], DO [place]

## 2020-04-02 NOTE — Progress Notes (Signed)
Subjective:    Patient ID: [name], male    DOB: Jul 07, 1932, 87 y.o.   MRN: [id number]  [name] is a 87 y.o. male presenting on 04/02/2020 for Hypertension, Arthritis, and Pre DM   HPI  Pre-Diabetes Previously controlled A1c 5.6 to 5.8 Now last A1c 6.2 12/2019 - due today CBGs: not checking Meds:none Currentlynot onACEi / ARB Lifestyle: - Diet (limited sugars,improving diet) - Exercise (walking) Upcoming laser glaucoma surgery 01/17/20 Denies hypoglycemia, polyuria, visual changes, numbness or tingling.  GERD / Dysphagia w/ esophageal stricture - He had recent history of esophageal stricture on barium swallow done in 09/2017. He had EGD and esophageal stretching in 10/2017 by Dr Allen Norris with improvement initially then seemed less improved. He was rx Pantoprazole 40mg  daily with again some improve at first, now only marginal improvement. Still has some choking or dysphagia, but has not had regurgitation, they want to wait before repeat procedure or other intervention has not returned in follow-up. - Request refill on med.  Followed by [facility] Dr Allen Norris Recent Swallowing study Also Poor dentition Poor PO intake at times with regards. Loss of proximal muscle loss History of prior hip replacement Generalized weakness  Insomnia Difficulty with some bad dreams. Admits restless.  Chronic Osteoarthritis, Back Pain Follow-upon chronic issue, he has tried some Tylenol Arthritis str 1-2 times a day with mild relief, Taking Meloxicam PRN. Using infrequently Followed by Orthopedic specialist. Admits issues with hips Denies any injury, fall, numbness tingling weakness  Atrial Fibrillation s/p maze ablation Last visit w/ Cardiology 12/2019 he has maintained sinus rhythm He is not taking anticoagulation    Depression screen [facility] 2/9 01/14/2020 12/31/2019 05/17/2019  Decreased Interest 0 0 0  Down, Depressed, Hopeless 0 0 0  PHQ - 2 Score 0 0 0  Difficult doing  work/chores - - -  Some recent data might be hidden    Social History   Tobacco Use  . Smoking status: Former Smoker    Types: Cigars    Quit date: 10/04/1988    Years since quitting: 31.5  . Smokeless tobacco: Former Systems developer    Types: [place] date: 09/04/1991  Vaping Use  . Vaping Use: Never used  Substance Use Topics  . Alcohol use: Yes    Comment: 1/3 cup of red wine occassionally  . Drug use: No    Review of Systems Per HPI unless specifically indicated above     Objective:    BP 122/63 (BP Location: Left Arm, Patient Position: Sitting, Cuff Size: Normal)   Pulse 61   Temp 97.7 F (36.5 C) (Temporal)   Ht 6\' 1"  (1.854 m)   Wt 148 lb (67.1 kg)   SpO2 100%   BMI 19.53 kg/m   Wt Readings from Last 3 Encounters:  04/02/20 148 lb (67.1 kg)  03/26/20 150 lb 6.4 oz (68.2 kg)  03/03/20 148 lb 6.4 oz (67.3 kg)    Physical Exam Vitals and nursing note reviewed.  Constitutional:      General: He is not in acute distress.    Appearance: He is well-developed. He is not diaphoretic.     Comments: Chronically ill but currently mostly well appearing, thin with some weight loss, comfortable, cooperative  HENT:     Head: Normocephalic and atraumatic.  Eyes:     General:        Right eye: No discharge.        Left eye: No discharge.  Conjunctiva/sclera: Conjunctivae normal.  Neck:     Thyroid: No thyromegaly.  Cardiovascular:     Rate and Rhythm: Normal rate and regular rhythm.     Heart sounds: Normal heart sounds. No murmur heard.   Pulmonary:     Effort: Pulmonary effort is normal. No respiratory distress.     Breath sounds: Normal breath sounds. No wheezing or rales.  Musculoskeletal:        General: Normal range of motion.     Cervical back: Normal range of motion and neck supple.     Comments: Proximal muscle atrophy lower extremity  Lymphadenopathy:     Cervical: No cervical adenopathy.  Skin:    General: Skin is warm and dry.     Findings: No  erythema or rash.  Neurological:     Mental Status: He is alert and oriented to person, place, and time.  Psychiatric:        Behavior: Behavior normal.     Comments: Well groomed, good eye contact, normal speech and thoughts     Recent Labs    05/13/19 0852 12/24/19 0814 04/02/20 1028  HGBA1C 5.7* 6.2* 5.6     Results for orders placed or performed in visit on 04/02/20  POCT glycosylated hemoglobin (Hb A1C)  Result Value Ref Range   Hemoglobin A1C 5.6 4.0 - 5.6 %      Assessment & Plan:   Problem List Items Addressed This Visit    Pre-diabetes - Primary    A1c improved to 5.6 W/ weight loss and diet changes Concern with HTN, HLD, CAD  Plan:  1. Not on any therapy currently 2. Encourage improved lifestyle - low carb, low sugar diet, reduce portion size, continue improving regular exercise      Relevant Orders   POCT glycosylated hemoglobin (Hb A1C) (Completed)   Moderate protein-calorie malnutrition (HCC)   Relevant Medications   mirtazapine (REMERON) 15 MG tablet   Mild dementia (HCC)   Relevant Medications   mirtazapine (REMERON) 15 MG tablet   Insomnia   Relevant Medications   mirtazapine (REMERON) 15 MG tablet     #Insomnia  #Protein calorie malnutrition Cognitive decline Recent worsening, relatively new problem Seems likely related to some history of cognitive decline/dementia With wt loss / appetite Start trial on Mirtazapine 15mg  nightly both for insomnia and for wt gain  Limited by swallowing/dentition Following w/ GI     Meds ordered this encounter  Medications  . mirtazapine (REMERON) 15 MG tablet    Sig: Take 1 tablet (15 mg total) by mouth at bedtime.    Dispense:  30 tablet    Refill:  2     Follow up plan: Return in about 4 months (around 08/02/2020) for 4 month PreDM A1c.   [name], [facility] 04/02/2020, 10:18 AM

## 2020-04-02 NOTE — Assessment & Plan Note (Signed)
A1c improved to 5.6 W/ weight loss and diet changes Concern with HTN, HLD, CAD  Plan:  1. Not on any therapy currently 2. Encourage improved lifestyle - low carb, low sugar diet, reduce portion size, continue improving regular exercise

## 2020-04-11 ENCOUNTER — Other Ambulatory Visit: Payer: Self-pay | Admitting: Family Medicine

## 2020-04-11 DIAGNOSIS — J3089 Other allergic rhinitis: Secondary | ICD-10-CM

## 2020-04-11 NOTE — Telephone Encounter (Signed)
Requested Prescriptions  Pending Prescriptions Disp Refills  . fluticasone (FLONASE) 50 MCG/ACT nasal spray [Pharmacy Med Name: FLUTICASONE PROP 50 MCG SPRAY] 16 mL 1    Sig: SPRAY TWO SPRAYS IN EACH NOSTRIL ONCE DAILY - USE FOR 4-6 WEEKS THEN STOP AND USE SEASONALLY OR AS NEEDED     Ear, Nose, and Throat: Nasal Preparations - Corticosteroids Passed - 04/11/2020 12:07 PM      Passed - Valid encounter within last 12 months    Recent Outpatient Visits          1 week ago Pre-diabetes   [place], DO   3 months ago Annual physical exam   [place], DO   8 months ago Constipation, unspecified constipation type   [place], DO   9 months ago Constipation, unspecified constipation type   [facility], Lupita Raider, FNP   11 months ago Pre-diabetes   [place], DO      Future Appointments            In 3 weeks [place], Ronda Fairly, MD [place]   In 2 months [place], Kathlene November, MD [facility], [place]   In 3 months [place], [facility], [place]   In 9 months  [facility], [facility]

## 2020-04-28 ENCOUNTER — Other Ambulatory Visit: Payer: Self-pay | Admitting: *Deleted

## 2020-04-28 DIAGNOSIS — Z8546 Personal history of malignant neoplasm of prostate: Secondary | ICD-10-CM

## 2020-04-28 DIAGNOSIS — E291 Testicular hypofunction: Secondary | ICD-10-CM

## 2020-04-29 ENCOUNTER — Other Ambulatory Visit: Payer: PPO

## 2020-04-29 ENCOUNTER — Other Ambulatory Visit: Payer: Self-pay

## 2020-04-29 DIAGNOSIS — E291 Testicular hypofunction: Secondary | ICD-10-CM

## 2020-04-30 LAB — TESTOSTERONE: Testosterone: 882 ng/dL (ref 264–916)

## 2020-04-30 LAB — HEMATOCRIT: Hematocrit: 40.3 % (ref 37.5–51.0)

## 2020-04-30 LAB — PSA: Prostate Specific Ag, Serum: <0.1 ng/mL (ref 0.0–4.0)

## 2020-05-04 ENCOUNTER — Ambulatory Visit: Payer: Self-pay | Admitting: Urology

## 2020-05-06 ENCOUNTER — Ambulatory Visit: Payer: Self-pay | Admitting: Urology

## 2020-05-26 ENCOUNTER — Other Ambulatory Visit: Payer: Self-pay | Admitting: Cardiovascular Disease

## 2020-05-27 ENCOUNTER — Other Ambulatory Visit: Payer: Self-pay

## 2020-05-27 ENCOUNTER — Encounter: Payer: Self-pay | Admitting: Urology

## 2020-05-27 ENCOUNTER — Ambulatory Visit: Payer: PPO | Admitting: Urology

## 2020-05-27 VITALS — BP 121/72 | HR 65 | Ht 73.0 in | Wt 147.8 lb

## 2020-05-27 DIAGNOSIS — E291 Testicular hypofunction: Secondary | ICD-10-CM | POA: Diagnosis not present

## 2020-05-27 DIAGNOSIS — Z8546 Personal history of malignant neoplasm of prostate: Secondary | ICD-10-CM

## 2020-05-27 NOTE — Progress Notes (Signed)
05/27/2020 10:56 AM   [name] June 29, 1932 [id number]  Referring provider: [name], DO [address] [place],  [place] [address]  Chief Complaint  Patient presents with  . Hypogonadism    Urologic history: 1.History prostate cancer low risk -Radical prostatectomy 2005; PSAs remain undetectable  2.Symptomatic hypogonadism -TRT started 2013   HPI: 87 y.o. male presents for annual follow-up.  His wife is with him today.   TRT started 2013 for symptoms of excessive sweating and flushing with resolution on replacement  Remains on 60 mg testosterone cypionate every 2 weeks  No bothersome LUTS  Denies dysuria, gross hematuria  Labs 04/29/2020: Testosterone 882 ng/dL; PSA <0.1; hematocrit 40.3   PMH: Past Medical History:  Diagnosis Date  . Arthritis   . Coronary artery disease    a. 1993 s/p PCI/BMS LAD;  b. 2003 PCI RCA;  c. 09/2006 ISR LAD->PCI, PCI RCA;  d. 02/2007 MV: no ischemia;  d. 01/2012 MV: EF 74%, no ischemia.  [name] History of prostate cancer   . Hyperlipidemia   . Hyperthyroidism   . Low testosterone   . PAF (paroxysmal atrial fibrillation) ([place])    a. previously on amio ->stopped in 2013;  b. recurrent PAF 01/2012->Pradaxa and amio initiated, subsequently came off of pradaxa 2/2 cost;  c. 02/2012 Echo: EF 55-60%, Gr 1 DD.  [name] Prostate cancer ([place])   . S/P CABG x 4     Surgical History: Past Surgical History:  Procedure Laterality Date  . APPENDECTOMY     Dr. Bary Castilla  . CARDIAC CATHETERIZATION  2008  . CLIPPING OF ATRIAL APPENDAGE Left 05/18/2012   Procedure: CLIPPING OF ATRIAL APPENDAGE;  Surgeon: [name], MD;  Location: [place];  Service: Open Heart Surgery;  Laterality: Left;  . COLONOSCOPY  2013  . COLONOSCOPY WITH PROPOFOL N/A 07/12/2016   Procedure: COLONOSCOPY WITH PROPOFOL;  Surgeon: [name], MD;  Location: [facility] ENDOSCOPY;  Service: Endoscopy;  Laterality: N/A;  . CORONARY ARTERY BYPASS GRAFT N/A 05/18/2012   Procedure:  CORONARY ARTERY BYPASS GRAFTING (CABG);  Surgeon: [name], MD;  Location: [place];  Service: Open Heart Surgery;  Laterality: N/A;  Times 4 using left internal mammary artery and endoscopically harvested right saphenous vein  . ESOPHAGOGASTRODUODENOSCOPY (EGD) WITH PROPOFOL N/A 10/24/2017   Procedure: ESOPHAGOGASTRODUODENOSCOPY (EGD) WITH PROPOFOL;  Surgeon: [name], MD;  Location: [facility] ENDOSCOPY;  Service: Endoscopy;  Laterality: N/A;  . HERNIA REPAIR     s/p mesh bilaterally, Dr. Bary Castilla  . INTRAOPERATIVE TRANSESOPHAGEAL ECHOCARDIOGRAM N/A 05/18/2012   Procedure: INTRAOPERATIVE TRANSESOPHAGEAL ECHOCARDIOGRAM;  Surgeon: [name], MD;  Location: [place];  Service: Open Heart Surgery;  Laterality: N/A;  . IR ESOPHAGUS DILITATION RETRO FLUORO    . LEFT HEART CATHETERIZATION WITH CORONARY ANGIOGRAM Bilateral 05/16/2012   Procedure: LEFT HEART CATHETERIZATION WITH CORONARY ANGIOGRAM;  Surgeon: Burnell Blanks, MD;  Location: [facility] CATH LAB;  Service: Cardiovascular;  Laterality: Bilateral;  . MAZE N/A 05/18/2012   Procedure: MAZE;  Surgeon: [name], MD;  Location: [place];  Service: Open Heart Surgery;  Laterality: N/A;  . MOHS SURGERY    . PROSTATECTOMY    . TOTAL HIP ARTHROPLASTY Right    Dr. [name]    Home Medications:  Allergies as of 05/27/2020      Reactions   Ambien [zolpidem Tartrate] Other (See Comments)   Super sensitive.    Pravastatin Sodium    REACTION: muscle aches...crestor   Rosuvastatin Calcium  REACTION: muscle aches...crestor   Statins    REACTION: muscle aches...crestor   Zolpidem Tartrate       Medication List       Accurate as of May 27, 2020 10:56 AM. If you have any questions, ask your nurse or doctor.        amLODipine 5 MG tablet Commonly known as: NORVASC Take 1 tablet (5 mg total) by mouth daily.   aspirin EC 81 MG tablet Take 81 mg by mouth every evening.   bisacodyl 10 MG suppository Commonly known as: DULCOLAX Place 10 mg  rectally daily as needed for moderate constipation.   dorzolamide-timolol 22.3-6.8 MG/ML ophthalmic solution Commonly known as: COSOPT 1 drop 2 (two) times daily.   ezetimibe 10 MG tablet Commonly known as: ZETIA TAKE ONE TABLET BY MOUTH DAILY   fluticasone 50 MCG/ACT nasal spray Commonly known as: FLONASE SPRAY TWO SPRAYS IN EACH NOSTRIL ONCE DAILY - USE FOR 4-6 WEEKS THEN STOP AND USE SEASONALLY OR AS NEEDED   hydrocortisone 2.5 % cream APPLY A THIN LAYER TO FACE TWICE A DAY AS NEEDED FOR 1 TO 2 WEEKS   latanoprost 0.005 % ophthalmic solution Commonly known as: XALATAN 0.005 drops daily.   meloxicam 15 MG tablet Commonly known as: MOBIC Take 15 mg by mouth daily.   mirtazapine 15 MG tablet Commonly known as: Remeron Take 1 tablet (15 mg total) by mouth at bedtime.   polyethylene glycol powder 17 GM/SCOOP powder Commonly known as: GLYCOLAX/MIRALAX Take 17-34 g by mouth daily. For constipation   simvastatin 20 MG tablet Commonly known as: ZOCOR   testosterone cypionate 200 MG/ML injection Commonly known as: DEPOTESTOSTERONE CYPIONATE INJECT 0.3ML INTO THE MUSCLE EVERY 14 DAYS   timolol 0.5 % ophthalmic solution Commonly known as: TIMOPTIC Place 1 drop into both eyes daily.   TYLENOL ARTHRITIS PAIN PO Take by mouth as needed.   Vitamin D3 1.25 MG (50000 UT) Caps Take by mouth daily.       Allergies:  Allergies  Allergen Reactions  . Ambien [Zolpidem Tartrate] Other (See Comments)    Super sensitive.   . Pravastatin Sodium     REACTION: muscle aches...crestor  . Rosuvastatin Calcium     REACTION: muscle aches...crestor  . Statins     REACTION: muscle aches...crestor  . Zolpidem Tartrate     Family History: Family History  Problem Relation Age of Onset  . Coronary artery disease Mother   . Heart disease Mother   . Coronary artery disease Sister   . Diabetes Sister   . Heart disease Sister 40       deceased  . Diabetes Father   . Heart disease  Brother     Social History:  reports that he quit smoking about 31 years ago. His smoking use included cigars. He quit smokeless tobacco use about 28 years ago.  His smokeless tobacco use included chew. He reports current alcohol use. He reports that he does not use drugs.   Physical Exam: BP 121/72   Pulse 65   Ht 6\' 1"  (1.854 m)   Wt 147 lb 12.8 oz (67 kg)   BMI 19.50 kg/m   Constitutional:  Alert and oriented, No acute distress. HEENT: [place] AT, moist mucus membranes.  Trachea midline, no masses. Cardiovascular: No clubbing, cyanosis, or edema. Respiratory: Normal respiratory effort, no increased work of breathing.   Assessment & Plan:    1.  Hypogonadism  Stable  Presently did not need a refill  67-month  lab visit PSA, testosterone, hematocrit  1 year office visit with labs  2.  History of prostate cancer  PSA remains undetectable   Abbie Sons, MD  [facility] [address], [place], [place] [address] [phone]

## 2020-05-28 DIAGNOSIS — E440 Moderate protein-calorie malnutrition: Secondary | ICD-10-CM

## 2020-05-28 DIAGNOSIS — F03A0 Unspecified dementia, mild, without behavioral disturbance, psychotic disturbance, mood disturbance, and anxiety: Secondary | ICD-10-CM

## 2020-05-28 DIAGNOSIS — F0390 Unspecified dementia without behavioral disturbance: Secondary | ICD-10-CM

## 2020-05-28 DIAGNOSIS — F5104 Psychophysiologic insomnia: Secondary | ICD-10-CM

## 2020-06-01 MED ORDER — MIRTAZAPINE 30 MG PO TABS: 30.0000 mg | ORAL_TABLET | Freq: Every day | ORAL | 2 refills | Status: DC

## 2020-06-01 NOTE — Addendum Note (Signed)
Addended by: [name] on: 06/01/2020 01:23 PM   Modules accepted: Orders

## 2020-06-20 NOTE — Progress Notes (Signed)
Date:  06/22/2020   ID:  [name], DOB 06/14/1932, MRN [id number]  Patient Location:  [address] [place] [address]   Provider location:   [facility], [place] office  PCP:  [name], DO  Cardiologist:  Arvid Right [facility]   Chief Complaint  Patient presents with   Other    6 month f/u no complaint today. Meds reviewed verbally with pt.    History of Present Illness:    [name] is a 87 y.o. male past medical history of coronary artery disease, bypass surgery may 2014  atrial fibrillation, previously on amiodarone, s/p maze stent placed to his LAD in 1993, stent placed to his mid RCA in 2003,  in-stent restenosis with repeat stent placed to his LAD in September 08,  stent placed to his RCA at the same time,  stress test in February 2009 and Jan 2014 which showed no ischemia,   low testosterone  Constipation, chronic Back pain He presents for routine followup of his coronary artery disease, paroxysmal atrial fibrillation   Wife presents with him on todays visit Continued Consipation issues  Back on simvastatin and zetia  Hip pain, followed by ortho, Dr. [name] Previously completed PT Less active than in the past On Remeron for sleep  Lab work reviewed Total chol 128 up to 168 off statin  LDL 72 up to 110 HBA1C 5.7 up to 6.2  Denies chest pain/angina, no tachycardia concerning for arrhythmia  EKG personally reviewed by myself on todays visit Shows normal sinus rhythm rate 74 bpm no significant ST-T wave changes  Other past medical history reviewed Previous episode of dizziness in 2015 dizziness. He was unable to get out of bed without assistance. Dizziness occurred while supine as well as sitting and standing. He went to the emergency room, was told that he could be dehydrated and was given IV fluids x2 bags. This seemed to improve his symptoms and was discharged home. Other workup in the hospital was negative including  negative cardiac enzymes, normal basic metabolic panel, normal LFTs, normal EKG   Previously had chest pain in may 2014. Presented to [facility], cardiac catheterization showing subtotal occlusion of his LAD stent, in-stent restenosis, severe OM1 and OM 2 disease also with RCA disease,  On  05/18/2012.  He underwent CABG x 4 utilizing LIMA to LAD, SVG to OM1, SVG to OM2, and SVG to PDA.  He also underwent Complete MAZE procedure with Clipping of Left Atrial Appendage.     He did not tolerate beta blockers or ACE inhibitor he had malaise, bradycardia. He took himself off these medications.    In  January 2014 he  had an episode of atrial fibrillation and presented to the emergency room. Converted back to normal sinus rhythm. Discharged on pradaxa. Recurrent episodes of atrial fibrillation shortly after that.     Past Medical History:  Diagnosis Date   Arthritis    Coronary artery disease    a. 1993 s/p PCI/BMS LAD;  b. 2003 PCI RCA;  c. 09/2006 ISR LAD->PCI, PCI RCA;  d. 02/2007 MV: no ischemia;  d. 01/2012 MV: EF 74%, no ischemia.   History of prostate cancer    Hyperlipidemia    Hyperthyroidism    Low testosterone    PAF (paroxysmal atrial fibrillation) (HCC)    a. previously on amio ->stopped in 2013;  b. recurrent PAF 01/2012->Pradaxa and amio initiated, subsequently came off of pradaxa 2/2 cost;  c.  02/2012 Echo: EF 55-60%, Gr 1 DD.   Prostate cancer ([place])    S/P CABG x 4    Past Surgical History:  Procedure Laterality Date   APPENDECTOMY     Dr. Bary Castilla   CARDIAC CATHETERIZATION  2008   CLIPPING OF ATRIAL APPENDAGE Left 05/18/2012   Procedure: CLIPPING OF ATRIAL APPENDAGE;  Surgeon: [name], MD;  Location: [place];  Service: Open Heart Surgery;  Laterality: Left;   COLONOSCOPY  2013   COLONOSCOPY WITH PROPOFOL N/A 07/12/2016   Procedure: COLONOSCOPY WITH PROPOFOL;  Surgeon: [name], MD;  Location[facility] ENDOSCOPY;  Service: Endoscopy;  Laterality: N/A;   CORONARY ARTERY BYPASS GRAFT  N/A 05/18/2012   Procedure: CORONARY ARTERY BYPASS GRAFTING (CABG);  Surgeon: [name], MD;  Location: [place];  Service: Open Heart Surgery;  Laterality: N/A;  Times 4 using left internal mammary artery and endoscopically harvested right saphenous vein   ESOPHAGOGASTRODUODENOSCOPY (EGD) WITH PROPOFOL N/A 10/24/2017   Procedure: ESOPHAGOGASTRODUODENOSCOPY (EGD) WITH PROPOFOL;  Surgeon: [name], MD;  Location: [facility] ENDOSCOPY;  Service: Endoscopy;  Laterality: N/A;   HERNIA REPAIR     s/p mesh bilaterally, Dr. Bary Castilla   INTRAOPERATIVE TRANSESOPHAGEAL ECHOCARDIOGRAM N/A 05/18/2012   Procedure: INTRAOPERATIVE TRANSESOPHAGEAL ECHOCARDIOGRAM;  Surgeon: [name], MD;  Location: [place];  Service: Open Heart Surgery;  Laterality: N/A;   IR ESOPHAGUS DILITATION RETRO FLUORO     LEFT HEART CATHETERIZATION WITH CORONARY ANGIOGRAM Bilateral 05/16/2012   Procedure: LEFT HEART CATHETERIZATION WITH CORONARY ANGIOGRAM;  Surgeon: Burnell Blanks, MD;  Location: [facility] CATH LAB;  Service: Cardiovascular;  Laterality: Bilateral;   MAZE N/A 05/18/2012   Procedure: MAZE;  Surgeon: [name], MD;  Location: [place];  Service: Open Heart Surgery;  Laterality: N/A;   MOHS SURGERY     PROSTATECTOMY     TOTAL HIP ARTHROPLASTY Right    Dr. [name]     Allergies:   Ambien [zolpidem tartrate], Pravastatin sodium, Rosuvastatin calcium, Statins, and Zolpidem tartrate   Social History   Tobacco Use   Smoking status: Former    Pack years: 0.00    Types: Cigars    Quit date: 10/04/1988    Years since quitting: 31.7   Smokeless tobacco: Former    Types: Chew    Quit date: 09/04/1991  Vaping Use   Vaping Use: Never used  Substance Use Topics   Alcohol use: Yes    Comment: 1/3 cup of red wine occassionally   Drug use: No     Current Outpatient Medications on File Prior to Visit  Medication Sig Dispense Refill   Acetaminophen (TYLENOL ARTHRITIS PAIN PO) Take by mouth as needed.     amLODipine (NORVASC)  5 MG tablet Take 1 tablet (5 mg total) by mouth daily. 90 tablet 3   aspirin EC 81 MG tablet Take 81 mg by mouth every evening.     bisacodyl (DULCOLAX) 10 MG suppository Place 10 mg rectally daily as needed for moderate constipation.     Cholecalciferol (VITAMIN D3) 1.25 MG (50000 UT) CAPS Take by mouth daily.     dorzolamide-timolol (COSOPT) 22.3-6.8 MG/ML ophthalmic solution 1 drop 2 (two) times daily.     ezetimibe (ZETIA) 10 MG tablet TAKE ONE TABLET BY MOUTH DAILY 30 tablet 0   fluticasone (FLONASE) 50 MCG/ACT nasal spray SPRAY TWO SPRAYS IN EACH NOSTRIL ONCE DAILY - USE FOR 4-6 WEEKS THEN STOP AND USE SEASONALLY OR AS NEEDED 16 mL 1   hydrocortisone  2.5 % cream APPLY A THIN LAYER TO FACE TWICE A DAY AS NEEDED FOR 1 TO 2 WEEKS 30 g 1   latanoprost (XALATAN) 0.005 % ophthalmic solution 0.005 drops daily.     meloxicam (MOBIC) 15 MG tablet Take 15 mg by mouth as needed for pain.     mirtazapine (REMERON) 30 MG tablet Take 1 tablet (30 mg total) by mouth at bedtime. 30 tablet 2   polyethylene glycol powder (GLYCOLAX/MIRALAX) 17 GM/SCOOP powder Take 17-34 g by mouth daily. For constipation 765 g 5   simvastatin (ZOCOR) 20 MG tablet Take 20 mg by mouth daily in the afternoon.     testosterone cypionate (DEPOTESTOSTERONE CYPIONATE) 200 MG/ML injection INJECT 0.3ML INTO THE MUSCLE EVERY 14 DAYS 2 mL 2   timolol (TIMOPTIC) 0.5 % ophthalmic solution Place 1 drop into both eyes daily.      No current facility-administered medications on file prior to visit.     Family Hx: The patient's family history includes Coronary artery disease in his mother and sister; Diabetes in his father and sister; Heart disease in his brother and mother; Heart disease (age of onset: 53) in his sister.  ROS:   Please see the history of present illness.    Review of Systems  Constitutional: Negative.   HENT: Negative.    Respiratory: Negative.    Cardiovascular: Negative.   Gastrointestinal: Negative.    Musculoskeletal: Negative.   Neurological: Negative.   Psychiatric/Behavioral: Negative.    All other systems reviewed and are negative.   Labs/Other Tests and Data Reviewed:    Recent Labs: 12/24/2019: ALT 9; BUN 15; Creat 1.06; Hemoglobin 13.3; Platelets 233; Potassium 4.2; Sodium 139; TSH 2.18   Recent Lipid Panel Lab Results  Component Value Date/Time   CHOL 166 12/24/2019 08:14 AM   CHOL 138 05/23/2016 10:01 AM   CHOL 123 05/14/2012 12:28 AM   TRIG 117 12/24/2019 08:14 AM   TRIG 53 05/14/2012 12:28 AM   HDL 34 (L) 12/24/2019 08:14 AM   HDL 38 (L) 05/23/2016 10:01 AM   HDL 44 05/14/2012 12:28 AM   CHOLHDL 4.9 12/24/2019 08:14 AM   LDLCALC 110 (H) 12/24/2019 08:14 AM   LDLCALC 68 05/14/2012 12:28 AM    Wt Readings from Last 3 Encounters:  06/22/20 147 lb 6 oz (66.8 kg)  05/27/20 147 lb 12.8 oz (67 kg)  04/02/20 148 lb (67.1 kg)     Exam:    Vital Signs: Vital signs may also be detailed in the HPI BP 120/68 (BP Location: Left Arm, Patient Position: Sitting, Cuff Size: Normal)   Pulse 74   Ht 6\' 2"  (1.88 m)   Wt 147 lb 6 oz (66.8 kg)   SpO2 98%   BMI 18.92 kg/m   Constitutional:  oriented to person, place, and time. No distress.  HENT:  Head: Grossly normal Eyes:  no discharge. No scleral icterus.  Neck: No JVD, no carotid bruits  Cardiovascular: Regular rate and rhythm, no murmurs appreciated Pulmonary/Chest: Clear to auscultation bilaterally, no wheezes or rails Abdominal: Soft.  no distension.  no tenderness.  Musculoskeletal: Normal range of motion Neurological:  normal muscle tone. Coordination normal. No atrophy Skin: Skin warm and dry Psychiatric: normal affect, pleasant   ASSESSMENT & PLAN:    Problem List Items Addressed This Visit       Cardiology Problems   Hyperlipidemia   Paroxysmal atrial fibrillation (HCC)   Relevant Orders   EKG 12-Lead   AAA (abdominal aortic aneurysm)  without rupture ([place])   Other Visit Diagnoses      Atherosclerosis of native coronary artery of native heart with stable angina pectoris ([place])    -  Primary   Relevant Medications   meloxicam (MOBIC) 15 MG tablet   Other Relevant Orders   EKG 12-Lead   Aortic atherosclerosis (HCC)       HYPERTENSION, BENIGN       S/P CABG x 4         CAD with stable angina Currently with no symptoms of angina. No further workup at this time. Continue current medication regimen.   Atrial fibrillation S/p maze maintaining normal sinus rhythm Off anticoagulation  Hypertension Blood pressure is well controlled on today's visit. No changes made to the medications.  Constipation, chronic Regular laxatives needed Discussed with him again today, has lots of modalities at home he can try  Hyperlipidemia On Zetia Only recently restarted his simvastatin Numbers were at goal when taking bone   Total encounter time more than 25 minutes  Greater than 50% was spent in counseling and coordination of care with the patient    Signed, Ida Rogue, [place] [id number], [place], [place] [address]

## 2020-06-22 ENCOUNTER — Ambulatory Visit: Payer: PPO | Admitting: Cardiovascular Disease

## 2020-06-22 ENCOUNTER — Encounter: Payer: Self-pay | Admitting: Cardiovascular Disease

## 2020-06-22 ENCOUNTER — Other Ambulatory Visit: Payer: Self-pay

## 2020-06-22 VITALS — BP 120/68 | HR 74 | Ht 74.0 in | Wt 147.4 lb

## 2020-06-22 DIAGNOSIS — E782 Mixed hyperlipidemia: Secondary | ICD-10-CM

## 2020-06-22 DIAGNOSIS — I25118 Atherosclerotic heart disease of native coronary artery with other forms of angina pectoris: Secondary | ICD-10-CM

## 2020-06-22 DIAGNOSIS — I714 Abdominal aortic aneurysm, without rupture, unspecified: Secondary | ICD-10-CM

## 2020-06-22 DIAGNOSIS — Z951 Presence of aortocoronary bypass graft: Secondary | ICD-10-CM | POA: Diagnosis not present

## 2020-06-22 DIAGNOSIS — I480 Paroxysmal atrial fibrillation: Secondary | ICD-10-CM | POA: Diagnosis not present

## 2020-06-22 DIAGNOSIS — I10 Essential (primary) hypertension: Secondary | ICD-10-CM | POA: Diagnosis not present

## 2020-06-22 DIAGNOSIS — I700 Atherosclerosis of aorta: Secondary | ICD-10-CM | POA: Diagnosis not present

## 2020-06-22 MED ORDER — EZETIMIBE 10 MG PO TABS: 10.0000 mg | ORAL_TABLET | Freq: Every day | ORAL | 3 refills | Status: DC

## 2020-06-22 MED ORDER — SIMVASTATIN 20 MG PO TABS: 20.0000 mg | ORAL_TABLET | Freq: Every day | ORAL | 3 refills | Status: DC

## 2020-06-22 MED ORDER — AMLODIPINE BESYLATE 5 MG PO TABS: 5.0000 mg | ORAL_TABLET | Freq: Every day | ORAL | 3 refills | Status: DC

## 2020-06-22 NOTE — Patient Instructions (Addendum)
Medication Instructions:  No changes  If you need a refill on your cardiac medications before your next appointment, please call your pharmacy.   Lab work: No new labs needed  Testing/Procedures: No new testing needed  Follow-Up:  You will need a follow up appointment in 12 months  Providers on your designated Care Team:   Murray Hodgkins, NP Christell Faith, PA-C Marrianne Mood, PA-C [place], [place]   COVID-19 Vaccine Information can be found at: ShippingScam.co.uk For questions related to vaccine distribution or appointments, please email [email or web address] or call 713-425-3949.

## 2020-06-23 ENCOUNTER — Other Ambulatory Visit: Payer: Self-pay | Admitting: Cardiovascular Disease

## 2020-06-29 MED ORDER — MIRTAZAPINE 15 MG PO TABS: 15.0000 mg | ORAL_TABLET | Freq: Every day | ORAL | 3 refills | Status: DC

## 2020-06-29 NOTE — Addendum Note (Signed)
Addended by: [name] on: 06/29/2020 05:29 PM   Modules accepted: Orders

## 2020-06-30 ENCOUNTER — Other Ambulatory Visit: Payer: Self-pay | Admitting: Family Medicine

## 2020-06-30 DIAGNOSIS — F5104 Psychophysiologic insomnia: Secondary | ICD-10-CM

## 2020-06-30 DIAGNOSIS — F0390 Unspecified dementia without behavioral disturbance: Secondary | ICD-10-CM

## 2020-06-30 DIAGNOSIS — F03A0 Unspecified dementia, mild, without behavioral disturbance, psychotic disturbance, mood disturbance, and anxiety: Secondary | ICD-10-CM

## 2020-06-30 DIAGNOSIS — E440 Moderate protein-calorie malnutrition: Secondary | ICD-10-CM

## 2020-07-06 ENCOUNTER — Other Ambulatory Visit: Payer: Self-pay | Admitting: Cardiovascular Disease

## 2020-07-16 ENCOUNTER — Other Ambulatory Visit: Payer: Self-pay | Admitting: *Deleted

## 2020-07-17 DIAGNOSIS — H401133 Primary open-angle glaucoma, bilateral, severe stage: Secondary | ICD-10-CM | POA: Diagnosis not present

## 2020-07-18 MED ORDER — TESTOSTERONE CYPIONATE 200 MG/ML IM SOLN: INTRAMUSCULAR | 2 refills | Status: DC

## 2020-08-05 ENCOUNTER — Other Ambulatory Visit: Payer: Self-pay

## 2020-08-05 ENCOUNTER — Ambulatory Visit (INDEPENDENT_AMBULATORY_CARE_PROVIDER_SITE_OTHER): Payer: PPO | Admitting: Family Medicine

## 2020-08-05 ENCOUNTER — Encounter: Payer: Self-pay | Admitting: Family Medicine

## 2020-08-05 VITALS — BP 112/60 | HR 67 | Ht 74.0 in | Wt 144.4 lb

## 2020-08-05 DIAGNOSIS — F03A0 Unspecified dementia, mild, without behavioral disturbance, psychotic disturbance, mood disturbance, and anxiety: Secondary | ICD-10-CM

## 2020-08-05 DIAGNOSIS — R7303 Prediabetes: Secondary | ICD-10-CM | POA: Diagnosis not present

## 2020-08-05 DIAGNOSIS — F0390 Unspecified dementia without behavioral disturbance: Secondary | ICD-10-CM

## 2020-08-05 DIAGNOSIS — E440 Moderate protein-calorie malnutrition: Secondary | ICD-10-CM | POA: Diagnosis not present

## 2020-08-05 DIAGNOSIS — W5503XA Scratched by cat, initial encounter: Secondary | ICD-10-CM

## 2020-08-05 DIAGNOSIS — S60519A Abrasion of unspecified hand, initial encounter: Secondary | ICD-10-CM | POA: Diagnosis not present

## 2020-08-05 DIAGNOSIS — F5104 Psychophysiologic insomnia: Secondary | ICD-10-CM

## 2020-08-05 DIAGNOSIS — Z23 Encounter for immunization: Secondary | ICD-10-CM

## 2020-08-05 LAB — POCT GLYCOSYLATED HEMOGLOBIN (HGB A1C): Hemoglobin A1C: 5.5 % (ref 4.0–5.6)

## 2020-08-05 MED ORDER — MIRTAZAPINE 15 MG PO TABS: 15.0000 mg | ORAL_TABLET | Freq: Every day | ORAL | 3 refills | Status: DC

## 2020-08-05 NOTE — Progress Notes (Signed)
Subjective:    Patient ID: [name], male    DOB: 1932/07/03, 87 y.o.   MRN: [id number]  [name] is a 87 y.o. male presenting on 08/05/2020 for Prediabetes   HPI   Pre-Diabetes Previously controlled A1c 5.6 to 5.8 Due today for A1c CBGs: not checking Meds: none Currently not on ACEi / ARB Lifestyle: - Diet reduced intake Denies hypoglycemia, polyuria, visual changes, numbness or tingling.   Reduced Appetite  GERD / Dysphagia w/ esophageal stricture - He had recent history of esophageal stricture on barium swallow done in 09/2017. He had EGD and esophageal stretching in 10/2017 by Dr Allen Norris  Has had repeat swallow study and determined no further intervention - At this time, still challenge with the swallowing. Sips water after each bite to help swallowing.   Loss of proximal muscle loss History of prior hip replacement Generalized weakness  Chronic Osteoarthritis, Back Pain Follow-up on chronic issue, he has tried some Tylenol Arthritis str 1-2 times a day with mild relief, Taking Meloxicam PRN. Using infrequently Followed by Orthopedic specialist. Admits issues with hips Denies any injury, fall, numbness tingling weakness  Insomnia On Mirtazapine    Depression screen [facility] 2/9 01/14/2020 12/31/2019 05/17/2019  Decreased Interest 0 0 0  Down, Depressed, Hopeless 0 0 0  PHQ - 2 Score 0 0 0  Difficult doing work/chores - - -  Some recent data might be hidden    Social History   Tobacco Use   Smoking status: Former    Types: Cigars    Quit date: 10/04/1988    Years since quitting: 31.8   Smokeless tobacco: Former    Types: Chew    Quit date: 09/04/1991  Vaping Use   Vaping Use: Never used  Substance Use Topics   Alcohol use: Yes    Comment: 1/3 cup of red wine occassionally   Drug use: No    Review of Systems Per HPI unless specifically indicated above     Objective:    BP 112/60 (BP Location: Left Arm, Patient Position: Sitting, Cuff Size:  Normal)   Pulse 67   Ht '6\' 2"'$  (1.88 m)   Wt 144 lb 6.4 oz (65.5 kg)   SpO2 100%   BMI 18.54 kg/m   Wt Readings from Last 3 Encounters:  08/05/20 144 lb 6.4 oz (65.5 kg)  06/22/20 147 lb 6 oz (66.8 kg)  05/27/20 147 lb 12.8 oz (67 kg)    Physical Exam Vitals and nursing note reviewed.  Constitutional:      General: He is not in acute distress.    Appearance: He is well-developed. He is not diaphoretic.     Comments: Chronically ill but currently mostly well appearing, thin with some weight loss, comfortable, cooperative  HENT:     Head: Normocephalic and atraumatic.  Eyes:     General:        Right eye: No discharge.        Left eye: No discharge.     Conjunctiva/sclera: Conjunctivae normal.  Neck:     Thyroid: No thyromegaly.  Cardiovascular:     Rate and Rhythm: Normal rate and regular rhythm.     Heart sounds: Normal heart sounds. No murmur heard. Pulmonary:     Effort: Pulmonary effort is normal. No respiratory distress.     Breath sounds: Normal breath sounds. No wheezing or rales.  Musculoskeletal:        General: Normal range of motion.  Cervical back: Normal range of motion and neck supple.     Comments: Proximal muscle atrophy lower extremity  Lymphadenopathy:     Cervical: No cervical adenopathy.  Skin:    General: Skin is warm and dry.     Findings: No erythema or rash.  Neurological:     Mental Status: He is alert and oriented to person, place, and time.  Psychiatric:        Behavior: Behavior normal.     Comments: Well groomed, good eye contact, normal speech and thoughts    Recent Labs    12/24/19 0814 04/02/20 1028 08/05/20 1549  HGBA1C 6.2* 5.6 5.5     Results for orders placed or performed in visit on 08/05/20  POCT HgB A1C  Result Value Ref Range   Hemoglobin A1C 5.5 4.0 - 5.6 %      Assessment & Plan:   Problem List Items Addressed This Visit     Pre-diabetes - Primary   Relevant Orders   POCT HgB A1C (Completed)   Moderate  protein-calorie malnutrition (HCC)   Relevant Medications   mirtazapine (REMERON) 15 MG tablet   Mild dementia (HCC)   Relevant Medications   mirtazapine (REMERON) 15 MG tablet   Insomnia   Relevant Medications   mirtazapine (REMERON) 15 MG tablet   Other Visit Diagnoses     Need for diphtheria-tetanus-pertussis (Tdap) vaccine       Relevant Orders   Tdap vaccine greater than or equal to 7yo IM (Completed)   Cat scratch of hand, initial encounter       Relevant Orders   Tdap vaccine greater than or equal to 7yo IM (Completed)      #Insomnia #Protein calorie malnutrition Cognitive decline Had been stable, then some recent wt loss Appetite has been a challenge but some days are better Taking Ensure if able, some meals reduced portions, trying to find foods he likes Seems likely related to some history of cognitive decline/dementia  Refill Mirtazapine '15mg'$  nightly for insomnia and appetite Did not do as well on '30mg'$  dose.  Reviewed avoid excess water, take some water w/ calories or other drinks, avoid filling up on liquid.  Exercise can be some moderate weight training but not low wt reps to avoid excessive calorie burn.  A1c is well controlled 5.5, no concern for PreDm at this time, no dietary restrictions for sweet/carb/starches   Limited by swallowing/dentition Now has improved dentition He has still dysphagia issues, and has improved w/ sip of water after each bite and manages to do well with this but still not perfect. But no further options per GI  #Cat Scratch, uncomplicated Tdap vaccine today    Meds ordered this encounter  Medications   mirtazapine (REMERON) 15 MG tablet    Sig: Take 1 tablet (15 mg total) by mouth at bedtime.    Dispense:  90 tablet    Refill:  3     Follow up plan: Return in about 5 months (around 01/05/2021) for 5 month Annual Physical fasting lab AM after.   [name], [facility] 08/05/2020, 3:38 PM

## 2020-08-05 NOTE — Patient Instructions (Addendum)
Thank you for coming to the office today.  Refileld Mirtazapine '15mg'$  90 day to [employer]  Tetanus Tdap today  Moderate strength building exercise, avoid low weight lots of reps  Try flavored waters / calories. A1c sugar is excellent 5.5, can have basically whatever food / drink you want, can improve that, keep on Ensure.  DUE for FASTING BLOOD WORK (no food or drink after midnight before the lab appointment, only water or coffee without cream/sugar on the morning of) AT TIME OF NEXT VISIT   Please schedule a Follow-up Appointment to: Return in about 5 months (around 01/05/2021) for 5 month Annual Physical fasting lab AM after.  If you have any other questions or concerns, please feel free to call the office or send a message through [place]. You may also schedule an earlier appointment if necessary.  Additionally, you may be receiving a survey about your experience at our office within a few days to 1 week by e-mail or mail. We value your feedback.  [name], DO [place]

## 2020-08-17 ENCOUNTER — Other Ambulatory Visit: Payer: Self-pay

## 2020-08-17 ENCOUNTER — Other Ambulatory Visit: Payer: Self-pay | Admitting: *Deleted

## 2020-08-17 ENCOUNTER — Ambulatory Visit (INDEPENDENT_AMBULATORY_CARE_PROVIDER_SITE_OTHER): Payer: PPO | Admitting: *Deleted

## 2020-08-17 DIAGNOSIS — E291 Testicular hypofunction: Secondary | ICD-10-CM | POA: Diagnosis not present

## 2020-08-17 MED ORDER — TESTOSTERONE CYPIONATE 200 MG/ML IM SOLN
6.0000 mg | Freq: Once | INTRAMUSCULAR | Status: AC
  Administered 2020-08-17: 6 mg via INTRAMUSCULAR

## 2020-08-17 NOTE — Progress Notes (Signed)
Patient ID: [name], male   DOB: [birth year], 87 y.o.   MRN: [id number] Testosterone IM Injection  Due to Hypogonadism patient is present today for a Testosterone Injection.  Medication: Testosterone Cypionate Dose: 0.91m Location: right upper outer buttocks Lot: [id number] Patient tolerated well, no complications were noted  Preformed by: DEdwin Dada CMA   Follow up: 2 weeks    FYI, pt will start to come to [facility] for testosterone injections

## 2020-08-18 MED ORDER — TESTOSTERONE CYPIONATE 200 MG/ML IM SOLN: 60.0000 mg | INTRAMUSCULAR | 0 refills | Status: DC

## 2020-08-19 ENCOUNTER — Telehealth: Payer: Self-pay

## 2020-08-19 NOTE — Telephone Encounter (Signed)
Incoming call from [place], pharmacist at [facility] who states she needs clarification as the patient is requesting a 36m vial. She states that they are not legally able to dispense that quantity as it will exceed the legal amount that can be given in a 60 day period. Advised her to dispense whatever is legally appropriate which appears to be the 166mvials, LiLattie Hawgrees.

## 2020-08-23 ENCOUNTER — Encounter: Payer: Self-pay | Admitting: Family Medicine

## 2020-08-23 DIAGNOSIS — E538 Deficiency of other specified B group vitamins: Secondary | ICD-10-CM

## 2020-08-23 DIAGNOSIS — I129 Hypertensive chronic kidney disease with stage 1 through stage 4 chronic kidney disease, or unspecified chronic kidney disease: Secondary | ICD-10-CM

## 2020-08-23 DIAGNOSIS — R7303 Prediabetes: Secondary | ICD-10-CM

## 2020-08-23 DIAGNOSIS — N183 Chronic kidney disease, stage 3 unspecified: Secondary | ICD-10-CM

## 2020-08-23 DIAGNOSIS — E440 Moderate protein-calorie malnutrition: Secondary | ICD-10-CM

## 2020-08-23 DIAGNOSIS — I2510 Atherosclerotic heart disease of native coronary artery without angina pectoris: Secondary | ICD-10-CM

## 2020-08-23 DIAGNOSIS — F0390 Unspecified dementia without behavioral disturbance: Secondary | ICD-10-CM

## 2020-08-23 DIAGNOSIS — E559 Vitamin D deficiency, unspecified: Secondary | ICD-10-CM

## 2020-08-23 DIAGNOSIS — F03A0 Unspecified dementia, mild, without behavioral disturbance, psychotic disturbance, mood disturbance, and anxiety: Secondary | ICD-10-CM

## 2020-08-25 ENCOUNTER — Other Ambulatory Visit: Payer: Self-pay

## 2020-08-25 DIAGNOSIS — E538 Deficiency of other specified B group vitamins: Secondary | ICD-10-CM

## 2020-08-25 DIAGNOSIS — E559 Vitamin D deficiency, unspecified: Secondary | ICD-10-CM

## 2020-08-25 DIAGNOSIS — I129 Hypertensive chronic kidney disease with stage 1 through stage 4 chronic kidney disease, or unspecified chronic kidney disease: Secondary | ICD-10-CM

## 2020-08-26 ENCOUNTER — Other Ambulatory Visit: Payer: PPO

## 2020-08-26 DIAGNOSIS — I129 Hypertensive chronic kidney disease with stage 1 through stage 4 chronic kidney disease, or unspecified chronic kidney disease: Secondary | ICD-10-CM | POA: Diagnosis not present

## 2020-08-26 DIAGNOSIS — E559 Vitamin D deficiency, unspecified: Secondary | ICD-10-CM | POA: Diagnosis not present

## 2020-08-26 DIAGNOSIS — N183 Chronic kidney disease, stage 3 unspecified: Secondary | ICD-10-CM | POA: Diagnosis not present

## 2020-08-26 DIAGNOSIS — E538 Deficiency of other specified B group vitamins: Secondary | ICD-10-CM | POA: Diagnosis not present

## 2020-08-26 NOTE — Addendum Note (Signed)
Addended by: [name] on: 08/26/2020 10:59 AM   Modules accepted: Orders

## 2020-08-27 ENCOUNTER — Telehealth: Payer: Self-pay | Admitting: Nurse Practitioner

## 2020-08-27 ENCOUNTER — Other Ambulatory Visit: Payer: Self-pay

## 2020-08-27 LAB — CBC WITH DIFFERENTIAL/PLATELET
Absolute Monocytes: 626 cells/uL (ref 200–950)
Basophils Absolute: 29 cells/uL (ref 0–200)
Basophils Relative: 0.5 %
Eosinophils Absolute: 168 cells/uL (ref 15–500)
Eosinophils Relative: 2.9 %
HCT: 40.3 % (ref 38.5–50.0)
Hemoglobin: 12.8 g/dL — ABNORMAL LOW (ref 13.2–17.1)
Lymphs Abs: 1566 cells/uL (ref 850–3900)
MCH: 27.4 pg (ref 27.0–33.0)
MCHC: 31.8 g/dL — ABNORMAL LOW (ref 32.0–36.0)
MCV: 86.3 fL (ref 80.0–100.0)
MPV: 12.3 fL (ref 7.5–12.5)
Monocytes Relative: 10.8 %
Neutro Abs: 3410 cells/uL (ref 1500–7800)
Neutrophils Relative %: 58.8 %
Platelets: 218 10*3/uL (ref 140–400)
RBC: 4.67 10*6/uL (ref 4.20–5.80)
RDW: 13.2 % (ref 11.0–15.0)
Total Lymphocyte: 27 %
WBC: 5.8 10*3/uL (ref 3.8–10.8)

## 2020-08-27 LAB — BASIC METABOLIC PANEL WITH GFR
BUN/Creatinine Ratio: 16 (calc) (ref 6–22)
BUN: 22 mg/dL (ref 7–25)
CO2: 26 mmol/L (ref 20–32)
Calcium: 9.3 mg/dL (ref 8.6–10.3)
Chloride: 106 mmol/L (ref 98–110)
Creat: 1.35 mg/dL — ABNORMAL HIGH (ref 0.70–1.22)
Glucose, Bld: 112 mg/dL — ABNORMAL HIGH (ref 65–99)
Potassium: 4 mmol/L (ref 3.5–5.3)
Sodium: 139 mmol/L (ref 135–146)
eGFR: 51 mL/min/{1.73_m2} — ABNORMAL LOW (ref >=60)

## 2020-08-27 LAB — VITAMIN D 25 HYDROXY (VIT D DEFICIENCY, FRACTURES): Vit D, 25-Hydroxy: 80 ng/mL (ref 30–100)

## 2020-08-27 LAB — VITAMIN B12: Vitamin B-12: 245 pg/mL (ref 200–1100)

## 2020-08-27 NOTE — Telephone Encounter (Signed)
Spoke with patient's wife, Neoma Laming, regarding the Palliative referral/services and she was in agreement with scheduling a visit.  I have scheduled an In-home Consult for 09/08/20 @ 9 AM.  Note:  Wife stated that she was going to call the patient's insurance company to find out if they provide a similar service and she will call us back to let us know if she would like to keep the appointment with [place].  Notified the Palliative Team of this.

## 2020-08-28 ENCOUNTER — Encounter: Payer: Self-pay | Admitting: Family Medicine

## 2020-08-28 DIAGNOSIS — D649 Anemia, unspecified: Secondary | ICD-10-CM

## 2020-08-29 NOTE — Addendum Note (Signed)
Addended by: [name] on: 08/29/2020 01:20 PM   Modules accepted: Orders

## 2020-08-31 ENCOUNTER — Ambulatory Visit (INDEPENDENT_AMBULATORY_CARE_PROVIDER_SITE_OTHER): Payer: PPO | Admitting: Family Medicine

## 2020-08-31 ENCOUNTER — Other Ambulatory Visit: Payer: Self-pay

## 2020-08-31 DIAGNOSIS — E291 Testicular hypofunction: Secondary | ICD-10-CM | POA: Diagnosis not present

## 2020-08-31 MED ORDER — TESTOSTERONE CYPIONATE 200 MG/ML IM SOLN
200.0000 mg | Freq: Once | INTRAMUSCULAR | Status: AC
  Administered 2020-08-31: 200 mg via INTRAMUSCULAR

## 2020-08-31 NOTE — Progress Notes (Signed)
Testosterone IM Injection  Due to Hypogonadism patient is present today for a Testosterone Injection.  Medication: Testosterone Cypionate Dose: 0.36m Location: left upper outer buttocks Lot: [id number] Exp:01/10/2022  Patient tolerated well, no complications were noted  Preformed by: CElberta Leatherwood CMA  Follow up: 2 weeks

## 2020-09-01 ENCOUNTER — Other Ambulatory Visit: Payer: Self-pay

## 2020-09-01 DIAGNOSIS — D649 Anemia, unspecified: Secondary | ICD-10-CM

## 2020-09-02 LAB — IRON,TIBC AND FERRITIN PANEL
%SAT: 34 % (calc) (ref 20–48)
Ferritin: 170 ng/mL (ref 24–380)
Iron: 94 ug/dL (ref 50–180)
TIBC: 273 mcg/dL (calc) (ref 250–425)

## 2020-09-08 ENCOUNTER — Other Ambulatory Visit: Payer: Self-pay

## 2020-09-08 ENCOUNTER — Other Ambulatory Visit: Payer: PPO | Admitting: Nurse Practitioner

## 2020-09-08 ENCOUNTER — Encounter: Payer: Self-pay | Admitting: Nurse Practitioner

## 2020-09-08 DIAGNOSIS — E440 Moderate protein-calorie malnutrition: Secondary | ICD-10-CM

## 2020-09-08 DIAGNOSIS — Z515 Encounter for palliative care: Secondary | ICD-10-CM | POA: Diagnosis not present

## 2020-09-08 DIAGNOSIS — G4700 Insomnia, unspecified: Secondary | ICD-10-CM

## 2020-09-08 DIAGNOSIS — K5909 Other constipation: Secondary | ICD-10-CM

## 2020-09-08 NOTE — Progress Notes (Signed)
[facility] Consult Note Telephone: [phone]  Fax: [phone]   Date of encounter: 09/09/83 5:25 PM PATIENT NAME: [name] [address] [name] [address]   [phone] (home)  DOB: 07/31/1932 MRN: [id number] PRIMARY CARE PROVIDER:    [name], DO,  [place] [address] [phone]  RESPONSIBLE PARTY:    Contact Information     Name Relation Home Work [name] Spouse [phone]  [phone]      I met face to face with patient and family in home. Palliative Care was asked to follow this patient by consultation request of  [name] * to address advance care planning and complex medical decision making. This is the initial visit.  ASSESSMENT AND PLAN / RECOMMENDATIONS:  Symptom Management/Plan: 1. Advance Care Planning; Wishes are to be DNR though wished not to complete a DNR goldenrod form today, will continue to think about it. Also reviewed MOST form, blank copy left for further discussion and Hard Choice book. We talked about living will, wishes completed through attorney  2. Memory impairment secondary to dementia, We talked about chronic disease progression with realistic expectations of dementia. 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 [name] has 1 daughter and 2 step children. We talked about medical goc in the setting of dementia. We talked about with progression, available resources. We did talk about Hospice benefit through Medicare program though at present time [name] not ready and not eligible under Dementia, possible PCM. We talked about family support system. We talked about concerns as Darrell. [name] endorses she has taken over the bills, ensuring things are completed like appointments, obligations as it has become more difficult for Darrell. [name] to remember, processing from start to finish.   3. Protein calorie  malnutrition likely secondary to dementia, dysphagia. We talked about nutrition, educated, food choices, they eat breakfast daily made at home, snacks including cheese doodles, then lunch and dinner out at restaurants. We talked about weight currently around 147 lbs, previous weights 200lbs with loss over last 2 years with muscle wasting.   4. Insomnia, discussed at length about sleep patterns, sleep hygiene, also OTC trying Melatonin 72m qhs.   5. Constipation, discussed bowel regimen, bowel patterns. We talked about nutrition with increase in fiber, oral fluids. We talked about trying senokot plus 1 tablet bid to see if can get bowels regulated. We did talked about declined appetite. We talked about having PC RN f/u in 2 weeks to see if any improvement with new bowel regimen. Darrell and [name] agreement.   6. Goals of Care: Goals include to maximize quality of life and symptom management. Our advance care planning conversation included a discussion about:    The value and importance of advance care planning  Exploration of personal, cultural or spiritual beliefs that might influence medical decisions  Exploration of goals of care in the event of a sudden injury or illness  Identification and preparation of a healthcare agent  Review and updating or creation of an advance directive document.  7. Palliative care encounter; Palliative care encounter; Palliative medicine team will continue to support patient, patient's family, and medical team. Visit consisted of counseling and education dealing with the complex and emotionally intense issues of symptom management and palliative care in the setting of serious and potentially life-threatening illness  8. f/u 2 month for ongoing monitoring chronic disease progression, ongoing discussions  complex medical decision making  Follow up Palliative Care Visit: Palliative care will continue to follow for complex medical decision making, advance care planning,  and clarification of goals. Return 8 weeks or prn.  I spent 78 minutes providing this consultation. More than 50% of the time in this consultation was spent in counseling and care coordination.  PPS: 50%  Chief Complaint: Initial palliative consult for complex medical decision making  HISTORY OF PRESENT ILLNESS:  [name] is a 87 y.o. year old male  with multiple medical problems including CAD s/p CABG, afib, HTN, PCM, chronic back pain, low testosterone (on replacement). I called [name] to confirm initial PC visit and covid screening negative. I visited Darrell and [name] in their home. We talked about purpose of PC visit. We talked about how [name] has been feeling. We reviewed symptoms including pain, current regimen is effective. We talked about overall generalized weakness, "legs are having more difficulty with strength, walking". We talked about [name] walking with [name] but usually [name] is his support when ambulating. [name] talked about when [name] fell inside the home using a cane, broke his ribs from the cane landing in his side. We talked about appetite, protein calorie malnutrition likely secondary to dementia, dysphagia. We talked about nutrition, educated, food choices, they eat breakfast daily made at home, snacks including cheese doodles, then lunch and dinner out at restaurants. We talked about weight currently around 147 lbs, previous weights 200lbs with loss over last 2 years with muscle wasting. We talked about Constipation, discussed bowel regimen, bowel patterns. We talked about nutrition with increase in fiber, oral fluids. We talked about trying senokot plus 1 tablet bid to see if can get bowels regulated. We did talked about declined appetite. We talked about having PC RN f/u in 2 weeks to see if any improvement with new bowel regimen. Darrell and [name] in agreement. Insomnia, discussed at length about sleep patterns, sleep hygiene, also OTC trying Melatonin 31m qhs. We  talked about chronic disease progression with realistic expectations of dementia. We talked about life review, retired from [employer]. Darrell. WNewshamhas 1 daughter and 2 step children. We talked about medical goc in the setting of dementia. We talked about with progression, available resources. We did talk about Hospice benefit through Medicare program though at present time [name] not ready and not eligible under Dementia, possible PCM. We talked about family support system. We talked about concerns as Darrell. [name] endorses she has taken over the bills, ensuring things are completed like appointments, obligations as it has become more difficult for [name] to remember, processing from start to finish. We talked about medical goals of care. We talked about ACP to include wishes are to be DNR though wished not to complete a DNR goldenrod form today, will continue to think about it. Also reviewed MOST form, blank copy left for further discussion and Hard Choice book. We talked about living will, wishes completed through attorney. We talked about role pc in poc. We talked [facility] about bowel regimen, recheck if melatonin has helped sleep; then NP in 6 weeks. Darrell and Darrell WSiedleckiin agreement, appointment scheduled. Therapeutic listening, emotional support provided.   History obtained from review of EMR, discussion with Darrell and  [name]  I reviewed available labs, medications, imaging, studies and related documents from the EMR.  Records reviewed and summarized  above.   ROS Full 14 system review of systems performed and negative with exception of: as per HPI.   Physical Exam: Constitutional: NAD General: frail appearing, thin, pleasant male EYES: lids intact ENMT: oral mucous membranes moist CV: S1S2, RRR, no LE edema Pulmonary: LCTA, no increased work of breathing, no cough, room air Abdomen: normo-active BS + 4 quadrants, soft and non tender MSK:  ambulatory with cane; muscle wasting Skin: warm and dry, no rashes or wounds on visible skin Neuro:  + generalized weakness,  +cognitive impairment Psych: non-anxious affect, A and O x 3  CURRENT PROBLEM LIST:  Patient Active Problem List   Diagnosis Date Noted   Moderate protein-calorie malnutrition ([place]) 04/02/2020   Osteoarthritis of lumbar spine 05/30/2019   Paroxysmal atrial fibrillation ([place]) 05/17/2019   Chronic right-sided low back pain without sciatica 11/16/2018   Gastroesophageal reflux disease without esophagitis 12/19/2017   Esophageal dysphagia    Stricture and stenosis of esophagus    Mild dementia ([place]) 06/07/2017   Primary osteoarthritis involving multiple joints 09/06/2016   Benign neoplasm of ascending colon    AAA (abdominal aortic aneurysm) without rupture (HCC) 04/17/2015   Constipation 09/23/2014   Chronic back pain 04/02/2014   Hardening of the aorta (main artery of the heart) ([place]) 04/02/2014   ED (erectile dysfunction) of organic origin 02/19/2013   Pre-diabetes 02/19/2013   Increased glucose level 02/19/2013   History of coronary artery bypass surgery 05/23/2012   S/P Maze operation for atrial fibrillation 05/23/2012   Postprocedural state 05/23/2012   Personal history of prostate cancer 09/06/2011   Male urinary stress incontinence 09/06/2011   Testicular hypofunction 06/22/2011   Insomnia 10/05/2010   Hyperlipidemia 06/17/2009   Benign hypertension with CKD (chronic kidney disease) stage III ([place]) 06/17/2009   Coronary arteriosclerosis in native artery 06/17/2009   History of atrial fibrillation 06/17/2009   Benign essential hypertension 06/17/2009   PAST MEDICAL HISTORY:  Active Ambulatory Problems    Diagnosis Date Noted   Hyperlipidemia 06/17/2009   Benign hypertension with CKD (chronic kidney disease) stage III ([place]) 06/17/2009   Coronary arteriosclerosis in native artery 06/17/2009   History of atrial fibrillation 06/17/2009   Insomnia  10/05/2010   Testicular hypofunction 06/22/2011   History of coronary artery bypass surgery 05/23/2012   S/P Maze operation for atrial fibrillation 05/23/2012   ED (erectile dysfunction) of organic origin 02/19/2013   Chronic back pain 04/02/2014   Constipation 09/23/2014   AAA (abdominal aortic aneurysm) without rupture ([place]) 04/17/2015   Pre-diabetes 02/19/2013   Personal history of prostate cancer 09/06/2011   Postprocedural state 05/23/2012   Male urinary stress incontinence 09/06/2011   Benign neoplasm of ascending colon    Primary osteoarthritis involving multiple joints 09/06/2016   Mild dementia ([place]) 06/07/2017   Esophageal dysphagia    Stricture and stenosis of esophagus    Gastroesophageal reflux disease without esophagitis 12/19/2017   Chronic right-sided low back pain without sciatica 11/16/2018   Paroxysmal atrial fibrillation ([place]) 05/17/2019   Osteoarthritis of lumbar spine 05/30/2019   Benign essential hypertension 06/17/2009   Hardening of the aorta (main artery of the heart) ([place]) 04/02/2014   Increased glucose level 02/19/2013   Moderate protein-calorie malnutrition ([place]) 04/02/2020   Resolved Ambulatory Problems    Diagnosis Date Noted   Constipation 04/27/2011   Screening for colon cancer 04/27/2011   Breast pain 04/27/2011   Hot flashes 05/30/2011   Abdominal distension 05/30/2011   Sweating abnormality 06/17/2011   Gynecomastia 06/22/2011  Weight loss 06/22/2011   Prostate cancer ([place]) 06/22/2011   Medicare annual wellness visit, subsequent 10/27/2011   Back pain, acute 04/03/2012   Floaters 09/18/2012   Elevated blood sugar 02/19/2013   Right-sided chest [place] pain 07/15/2013   Dizziness 09/25/2013   Hospital discharge follow-up 02/16/2014   Neck mass 09/23/2014   Thyroid nodule 10/02/2014   Back pain 11/04/2014   Acute bronchitis 02/25/2015   Cat scratch 05/28/2015   Past Medical History:  Diagnosis Date   Arthritis    Coronary artery  disease    History of prostate cancer    Hyperthyroidism    Low testosterone    PAF (paroxysmal atrial fibrillation) (HCC)    S/P CABG x 4    SOCIAL HX:  Social History   Tobacco Use   Smoking status: Former    Types: Cigars    Quit date: 10/04/1988    Years since quitting: 31.9   Smokeless tobacco: Former    Types: Chew    Quit date: 09/04/1991  Substance Use Topics   Alcohol use: Yes    Comment: 1/3 cup of red wine occassionally   FAMILY HX:  Family History  Problem Relation Age of Onset   Coronary artery disease Mother    Heart disease Mother    Coronary artery disease Sister    Diabetes Sister    Heart disease Sister 40       deceased   Diabetes Father    Heart disease Brother     reviewed  ALLERGIES:  Allergies  Allergen Reactions   Ambien [Zolpidem Tartrate] Other (See Comments)    Super sensitive.    Pravastatin Sodium     REACTION: muscle aches...crestor   Rosuvastatin Calcium     REACTION: muscle aches...crestor   Zolpidem Tartrate      PERTINENT MEDICATIONS:  Outpatient Encounter Medications as of 09/08/2020  Medication Sig   Acetaminophen (TYLENOL ARTHRITIS PAIN PO) Take by mouth as needed.   amLODipine (NORVASC) 5 MG tablet Take 1 tablet (5 mg total) by mouth daily.   aspirin EC 81 MG tablet Take 81 mg by mouth every evening.   bisacodyl (DULCOLAX) 10 MG suppository Place 10 mg rectally daily as needed for moderate constipation.   Cholecalciferol (VITAMIN D3) 1.25 MG (50000 UT) CAPS Take by mouth daily.   dorzolamide-timolol (COSOPT) 22.3-6.8 MG/ML ophthalmic solution 1 drop 2 (two) times daily.   ezetimibe (ZETIA) 10 MG tablet Take 1 tablet (10 mg total) by mouth daily.   fluticasone (FLONASE) 50 MCG/ACT nasal spray SPRAY TWO SPRAYS IN EACH NOSTRIL ONCE DAILY - USE FOR 4-6 WEEKS THEN STOP AND USE SEASONALLY OR AS NEEDED   hydrocortisone 2.5 % cream APPLY A THIN LAYER TO FACE TWICE A DAY AS NEEDED FOR 1 TO 2 WEEKS   latanoprost (XALATAN) 0.005 %  ophthalmic solution 0.005 drops daily.   meloxicam (MOBIC) 15 MG tablet Take 15 mg by mouth as needed for pain.   mirtazapine (REMERON) 15 MG tablet Take 1 tablet (15 mg total) by mouth at bedtime.   polyethylene glycol powder (GLYCOLAX/MIRALAX) 17 GM/SCOOP powder Take 17-34 g by mouth daily. For constipation   simvastatin (ZOCOR) 20 MG tablet Take 1 tablet (20 mg total) by mouth daily in the afternoon. (Patient not taking: Reported on 08/05/2020)   testosterone cypionate (DEPOTESTOSTERONE CYPIONATE) 200 MG/ML injection Inject 0.3 mLs (60 mg total) into the muscle every 14 (fourteen) days.   timolol (TIMOPTIC) 0.5 % ophthalmic solution Place 1 drop into both  eyes daily.    No facility-administered encounter medications on file as of 09/08/2020.  Questions and concerns were addressed. The patient/family was encouraged to call with questions and/or concerns. My business card was provided. Provided general support and encouragement, no other unmet needs identified   Thank you for the opportunity to participate in the care of Darrell. Wachter.  The palliative care team will continue to follow. Please call our office at [phone] if we can be of additional assistance.   This chart was dictated using voice recognition software.  Despite best efforts to proofread,  errors can occur which can change the documentation meaning.   Karnisha Lefebre Z Deshante Cassell, NP ,   COVID-19 PATIENT SCREENING TOOL Asked and negative response unless otherwise noted:  Have you had symptoms of covid, tested positive or been in contact with someone with symptoms/positive test in the past 5-10 days? NO

## 2020-09-09 ENCOUNTER — Telehealth: Payer: Self-pay

## 2020-09-09 NOTE — Telephone Encounter (Signed)
1011 am.  Phone call made to spouse [name] to schedule a home visit next month at the request of Darrell Gusler, NP.  Wife is agreeable to September 14th @ 130 pm.  Advised wife that NP would like to complete a virtual visit at that time.  Wife is agreeable.  She advised that patient is under [employer] and has 100% coverage for Palliative Care visits.  She is asking if she needs to do anything on her end to ensure billing is covered.  Advised that I would follow up with our [facility] regarding billing questions. No other needs at this time.

## 2020-09-15 ENCOUNTER — Ambulatory Visit (INDEPENDENT_AMBULATORY_CARE_PROVIDER_SITE_OTHER): Payer: PPO | Admitting: *Deleted

## 2020-09-15 ENCOUNTER — Other Ambulatory Visit: Payer: Self-pay

## 2020-09-15 DIAGNOSIS — E291 Testicular hypofunction: Secondary | ICD-10-CM | POA: Diagnosis not present

## 2020-09-15 MED ORDER — TESTOSTERONE CYPIONATE 200 MG/ML IM SOLN
200.0000 mg | Freq: Once | INTRAMUSCULAR | Status: AC
  Administered 2020-09-15: 200 mg via INTRAMUSCULAR

## 2020-09-15 NOTE — Progress Notes (Signed)
Testosterone IM Injection   Due to Hypogonadism patient is present today for a Testosterone Injection.   Medication: Testosterone Cypionate Dose: 0.27m Location: right upper outer buttocks Lot: [id number] Exp:01/10/2022   Patient tolerated well, no complications were noted   Preformed [id number] CMA   Follow up: 2 weeks

## 2020-09-23 ENCOUNTER — Other Ambulatory Visit: Payer: Self-pay

## 2020-09-23 ENCOUNTER — Other Ambulatory Visit: Payer: PPO | Admitting: Nurse Practitioner

## 2020-09-23 ENCOUNTER — Encounter: Payer: Self-pay | Admitting: Nurse Practitioner

## 2020-09-23 VITALS — BP 128/72 | HR 70 | Temp 97.3°F | Resp 18

## 2020-09-23 DIAGNOSIS — Z515 Encounter for palliative care: Secondary | ICD-10-CM | POA: Diagnosis not present

## 2020-09-23 DIAGNOSIS — E440 Moderate protein-calorie malnutrition: Secondary | ICD-10-CM | POA: Diagnosis not present

## 2020-09-23 DIAGNOSIS — G4700 Insomnia, unspecified: Secondary | ICD-10-CM | POA: Diagnosis not present

## 2020-09-23 DIAGNOSIS — K5909 Other constipation: Secondary | ICD-10-CM

## 2020-09-23 NOTE — Progress Notes (Addendum)
[facility] Consult Note Telephone: [phone]  Fax: [phone]    Date of encounter: 09/23/20 1:28 PM PATIENT NAME: [name] [address] [place] [address]   [phone] (home)  DOB: 22-Feb-1932 MRN: [id number] PRIMARY CARE PROVIDER:    [name], DO,  [place] [address] [phone]  RESPONSIBLE PARTY:    Contact Information     Name Relation Home Work [name] Spouse [phone]  [phone]      Due to the COVID-19 crisis, this visit was done via telemedicine from my office and it was initiated and consent by this patient and or family.  I connected with Darrell Gather RN Palliative in the home with [name] on 09/23/20 by a video enabled telemedicine application and verified that I am speaking with the correct person.   I discussed the limitations of evaluation and management by telemedicine. The patient expressed understanding and agreed to proceed. Palliative Care was asked to follow this patient by consultation request of  [name] * to address advance care planning and complex medical decision making. This is a follow up visit.                                  ASSESSMENT AND PLAN / RECOMMENDATIONS: Symptom Management/Plan: 1. Advance Care Planning; Wishes are to be DNR   2. Memory impairment secondary to dementia, continue to monitor. FAST 6A   3. Protein calorie malnutrition likely secondary to dementia, dysphagia. Will continue to monitor weights. No new weight loss. He has gone down to a pants size from 36 to 34. Mr and Mrs [name] continue to go to eat 2 meals out with breakfast being served at home.    4. Insomnia-Recommended trying melatonin to aide in sleep on last Initial PC visit.  Mr [name] did not feel this was helpful.  He reports getting up 1-2x during the night to void but returning to bed rather than going into the living room.  This routine has  proven beneficial and patient notes better sleep. Continue to monitor sleep patterns, encourage sleep hygiene.   5. Constipation- [name] endorses Mr [name] bowel movements fluctuate from loose to hard.  Miralax and Senekot are not being taking regularly. Mr. [name] notes he has been working to find a balance with his medications.  When he took Miralax daily, he would double the dose by day 3 of no BM.  By day 4, patient would have a BM and then begin having loose stools.  When Senekot was used daily, Mrs Borenstein felt bowel movements were more regular.  Discussed using Senekot daily or every other day if patient felt bowels were to loose.  Mr. [name] is considering this or using Senekot qod and alternating with Miralax.    6. Palliative care encounter; Palliative care encounter; Palliative medicine team will continue to support patient, patient's family, and medical team. Visit consisted of counseling and education dealing with the complex and emotionally intense issues of symptom management and palliative care in the setting of serious and potentially life-threatening illness  Follow up Palliative Care Visit: Palliative care will continue to follow for complex medical decision making, advance care planning, and clarification of goals. Return 8 weeks or prn.  I spent 62 minutes providing this consultation. More than 50% of the time in this consultation was spent in counseling  and care coordination. PPS: 50%  Chief Complaint: Follow up palliative visit for complex medical decision making  HISTORY OF PRESENT ILLNESS:  [name] is a 87 y.o. year old male with multiple medical problems including CAD s/p CABG, afib, HTN, PCM, chronic back pain, low testosterone (on replacement).  Joint visit completed with Mr and Mrs. [name] and connected virtually with Darrell [name] [name], NP for Palliative Care.  [name] endorses patient has more energy since iron, B12 and Vitamin C were started.  Mr. [name] also agrees.  Mr.  [name] denies any issues with pain since NP's last visit.  Discussed generalized weakness and patient continues to have weakness to his lower extremities.  He denies any recent falls and continues to ambulate with a cane.   Appetite continues to be fair.  Breakfast continues to be consumed at home and lunch and dinner are eaten out.   Mr. Ninh is unable to weigh himself at home as the home scale does not work at this time.  [name] reports patient is weighed every 14 days at his doctor's office.  Last documented weight in Epic was 08/05/20 with a weight of 144 lbs and BMI of 18.54. Further discussion completed on current bowel regimen. [name] endorses Mr [name] bowel movements fluctuate from loose to hard.  Miralax and Senekot are not being taking regularly. Mr. [name] notes he has been working to find a balance with his medications.  When he took Miralax daily, he would double the dose by day 3 of no BM.  By day 4, patient would have a BM and then begin having loose stools.  When Senekot was used daily, Mrs [name] felt bowel movements were more regular.  Discussed using Senekot daily or every other day if patient felt bowels were to loose.  Mr. Barbieri is considering this or using Senekot qod and alternating with Miralax.  [name] endorses increase in fiber with vegetables, legumes and adequate hydration to aide in regular bowel movements.  Follow up completed on insomnia.  Recommended trying melatonin to aide in sleep on last Initial PC visit.  Mr [name] did not feel this was helpful.  He reports getting up 1-2x during the night to void but returning to bed rather than going into the living room.  This routine has proven beneficial and patient notes better sleep. Continue to monitor sleep patterns, encourage sleep hygiene.  Mr. Guman engaged in life review and discussed his time working at [employer].  [name] assisted in conversation adding details that patient had left out.  No further questions or concerns  voiced by Mr or Mrs. [name].  NP scheduled next home visit for November with Mr. [name]. Medical goals reviewed. Reviewed purpose of PC f/u visit, Mr and Mrs Warfield both in agreement. Therapeutic listening, emotional support provided.   History obtained from review of EMR, discussion with Mr. Dobmeier.  I reviewed available labs, medications, imaging, studies and related documents from the EMR.  Records reviewed and summarized above.   ROS Full 14 system review of systems performed and negative with exception of: as per HPI.   Physical Exam: Constitutional: NAD General: frail appearing, thin male  ENMT: oral mucous membranes moist CV: S1S2, RRR, no LE edema Pulmonary: LCTA, no increased work of breathing, no cough, room air Abdomen: normo-active BS + 4 quadrants, soft and non tender MSK: moves all extremities, ambulatory Skin: warm and dry, no rashes or wounds on visible skin Neuro:  + generalized weakness,  +  mild cognitive impairment Psych: non-anxious affect, A and O x 3  Questions and concerns were addressed. The patient/family was encouraged to call with questions and/or concerns. My contact was provided. Provided general support and encouragement, no other unmet needs identified   Thank you for the opportunity to participate in the care of Mr. Rieken.  The palliative care team will continue to follow. Please call our office at [phone] if we can be of additional assistance.   This chart was dictated using voice recognition software.  Despite best efforts to proofread,  errors can occur which can change the documentation meaning.   Dravyn Severs Z Shandell Jallow, NP   Vital signs and physical assessment completed by Darrell Gather, RN  COVID-19 PATIENT SCREENING TOOL Asked and negative response unless otherwise noted:   Have you had symptoms of covid, tested positive or been in contact with someone with symptoms/positive test in the past 5-10 days?  No

## 2020-09-28 ENCOUNTER — Other Ambulatory Visit: Payer: Self-pay

## 2020-09-28 ENCOUNTER — Ambulatory Visit (INDEPENDENT_AMBULATORY_CARE_PROVIDER_SITE_OTHER): Payer: PPO

## 2020-09-28 DIAGNOSIS — E291 Testicular hypofunction: Secondary | ICD-10-CM | POA: Diagnosis not present

## 2020-09-28 MED ORDER — TESTOSTERONE CYPIONATE 200 MG/ML IM SOLN
60.0000 mg | Freq: Once | INTRAMUSCULAR | Status: AC
  Administered 2020-09-28: 60 mg via INTRAMUSCULAR

## 2020-09-28 NOTE — Progress Notes (Signed)
Testosterone IM Injection  Due to Hypogonadism patient is present today for a Testosterone Injection.  Medication: Testosterone Cypionate Dose: 0.56ml Location: left upper outer buttocks Lot: [id number] Exp:03/2022  Patient tolerated well, no complications were noted  Preformed by: Fonnie Jarvis, CMA  Follow up: 2 wk injection

## 2020-10-12 ENCOUNTER — Other Ambulatory Visit: Payer: Self-pay

## 2020-10-12 ENCOUNTER — Ambulatory Visit (INDEPENDENT_AMBULATORY_CARE_PROVIDER_SITE_OTHER): Payer: PPO

## 2020-10-12 DIAGNOSIS — E291 Testicular hypofunction: Secondary | ICD-10-CM

## 2020-10-12 MED ORDER — TESTOSTERONE CYPIONATE 200 MG/ML IM SOLN
60.0000 mg | Freq: Once | INTRAMUSCULAR | Status: AC
Start: 2020-10-12 — End: 2020-10-12
  Administered 2020-10-12: 60 mg via INTRAMUSCULAR

## 2020-10-12 MED ORDER — TESTOSTERONE CYPIONATE 200 MG/ML IM SOLN: 60.0000 mg | INTRAMUSCULAR | 0 refills | Status: DC

## 2020-10-12 NOTE — Progress Notes (Signed)
Testosterone IM Injection  Due to Hypogonadism patient is present today for a Testosterone Injection.  Medication: Testosterone Cypionate Dose: 0.77ml Location: right upper outer buttocks Lot: [id number] Exp:03/2022  Patient tolerated well, no complications were noted  Preformed by: Fonnie Jarvis, CMA  Follow up: 2wk injection, patient will need a refill sent for next appointment to bring medication in. Will contact pharmacy to clarify script instructions due to issues with previous refills at quantity of 67ml vial.  Spoke w/ Lattie Haw at [facility] she states she will accept a script for 32ml vials #10 with no refills but cannot fill a multi-dose 48ml vial.

## 2020-10-16 ENCOUNTER — Encounter: Payer: Self-pay | Admitting: Urology

## 2020-10-26 ENCOUNTER — Other Ambulatory Visit: Payer: Self-pay

## 2020-10-26 ENCOUNTER — Ambulatory Visit (INDEPENDENT_AMBULATORY_CARE_PROVIDER_SITE_OTHER): Payer: PPO | Admitting: Family Medicine

## 2020-10-26 DIAGNOSIS — E291 Testicular hypofunction: Secondary | ICD-10-CM | POA: Diagnosis not present

## 2020-10-26 MED ORDER — TESTOSTERONE CYPIONATE 200 MG/ML IM SOLN
200.0000 mg | Freq: Once | INTRAMUSCULAR | Status: AC
Start: 2020-10-26 — End: 2020-10-26
  Administered 2020-10-26: 200 mg via INTRAMUSCULAR

## 2020-10-26 NOTE — Progress Notes (Signed)
Testosterone IM Injection  Due to Hypogonadism patient is present today for a Testosterone Injection.  Medication: Testosterone Cypionate Dose: 0.11ml Location: left upper outer buttocks Lot: [id number] Exp:03/11/2022  Patient tolerated well, no complications were noted  Performed by: Elberta Leatherwood, CMA  Follow up: 2 weeks

## 2020-10-28 ENCOUNTER — Other Ambulatory Visit: Payer: PPO | Admitting: Nurse Practitioner

## 2020-10-28 ENCOUNTER — Encounter: Payer: Self-pay | Admitting: Nurse Practitioner

## 2020-10-28 ENCOUNTER — Other Ambulatory Visit: Payer: Self-pay

## 2020-10-28 ENCOUNTER — Ambulatory Visit: Payer: PPO | Admitting: Dermatology

## 2020-10-28 ENCOUNTER — Encounter: Payer: Self-pay | Admitting: Dermatology

## 2020-10-28 DIAGNOSIS — L813 Cafe au lait spots: Secondary | ICD-10-CM

## 2020-10-28 DIAGNOSIS — R441 Visual hallucinations: Secondary | ICD-10-CM

## 2020-10-28 DIAGNOSIS — L570 Actinic keratosis: Secondary | ICD-10-CM

## 2020-10-28 DIAGNOSIS — E440 Moderate protein-calorie malnutrition: Secondary | ICD-10-CM

## 2020-10-28 DIAGNOSIS — G4700 Insomnia, unspecified: Secondary | ICD-10-CM | POA: Diagnosis not present

## 2020-10-28 DIAGNOSIS — L820 Inflamed seborrheic keratosis: Secondary | ICD-10-CM

## 2020-10-28 DIAGNOSIS — Z515 Encounter for palliative care: Secondary | ICD-10-CM | POA: Diagnosis not present

## 2020-10-28 NOTE — Patient Instructions (Addendum)
Prior to procedure, discussed risks of blister formation, small wound, skin dyspigmentation, or rare scar following cryotherapy. Recommend Vaseline ointment to treated areas while healing.   Cryotherapy Aftercare  Wash gently with soap and water everyday.   Apply Vaseline and Band-Aid daily until healed.   If you have any questions or concerns for your doctor, please call our main line at [phone] and press option 4 to reach your doctor's medical assistant. If no one answers, please leave a voicemail as directed and we will return your call as soon as possible. Messages left after 4 pm will be answered the following business day.   You may also send [place] a message via [place]. We typically respond to MyChart messages within 1-2 business days.  For prescription refills, please ask your pharmacy to contact our office. Our fax number is [phone].  If you have an urgent issue when the clinic is closed that cannot wait until the next business day, you can page your doctor at the number below.    Please note that while we do our best to be available for urgent issues outside of office hours, we are not available 24/7.   If you have an urgent issue and are unable to reach [place], you may choose to seek medical care at your doctor's office, retail clinic, urgent care center, or emergency room.  If you have a medical emergency, please immediately call 911 or go to the emergency department.  Pager Numbers  - Dr. Nehemiah Massed: [phone]  - Dr. Laurence Ferrari: [phone]  - Dr. Nicole Kindred: [phone]  In the event of inclement weather, please call our main line at [phone] for an update on the status of any delays or closures.  Dermatology Medication Tips: Please keep the boxes that topical medications come in in order to help keep track of the instructions about where and how to use these. Pharmacies typically print the medication instructions only on the boxes and not directly on the medication  tubes.   If your medication is too expensive, please contact our office at [phone] option 4 or send [place] a message through [place].   We are unable to tell what your co-pay for medications will be in advance as this is different depending on your insurance coverage. However, we may be able to find a substitute medication at lower cost or fill out paperwork to get insurance to cover a needed medication.   If a prior authorization is required to get your medication covered by your insurance company, please allow [place] 1-2 business days to complete this process.  Drug prices often vary depending on where the prescription is filled and some pharmacies may offer cheaper prices.  The website www.goodrx.com contains coupons for medications through different pharmacies. The prices here do not account for what the cost may be with help from insurance (it may be cheaper with your insurance), but the website can give you the price if you did not use any insurance.  - You can print the associated coupon and take it with your prescription to the pharmacy.  - You may also stop by our office during regular business hours and pick up a GoodRx coupon card.  - If you need your prescription sent electronically to a different pharmacy, notify our office through [facility] or by phone at [phone] option 4.

## 2020-10-28 NOTE — Progress Notes (Signed)
   Follow-Up Visit   Subjective  [name] is a 87 y.o. male who presents for the following: lesion (Right hand. Raised, rough. Non tender, denies bleeding. Has used OTC freeze spray, no help. Dur: 1-2 years).  Also itchy spot on scalp.  Wife with patient.   The following portions of the chart were reviewed this encounter and updated as appropriate:      Review of Systems: No other skin or systemic complaints except as noted in HPI or Assessment and Plan.   Objective  Well appearing patient in no apparent distress; mood and affect are within normal limits.  A focused examination was performed including face, right hand. Relevant physical exam findings are noted in the Assessment and Plan.  Right Dorsal Hand 0.6 cm keratotic papule  vertex scalp x1 Erythematous keratotic or waxy stuck-on papule   Assessment & Plan  Hypertrophic actinic keratosis Right Dorsal Hand  Recheck on f/up  Destruction of lesion - Right Dorsal Hand  Destruction method: cryotherapy   Informed consent: discussed and consent obtained   Lesion destroyed using liquid nitrogen: Yes   Region frozen until ice ball extended beyond lesion: Yes   Outcome: patient tolerated procedure well with no complications   Post-procedure details: wound care instructions given   Additional details:  Prior to procedure, discussed risks of blister formation, small wound, skin dyspigmentation, or rare scar following cryotherapy. Recommend Vaseline ointment to treated areas while healing.   Inflamed seborrheic keratosis vertex scalp x1  Destruction of lesion - vertex scalp x1  Destruction method: cryotherapy   Informed consent: discussed and consent obtained   Lesion destroyed using liquid nitrogen: Yes   Region frozen until ice ball extended beyond lesion: Yes   Outcome: patient tolerated procedure well with no complications   Post-procedure details: wound care instructions given   Additional details:  Prior to  procedure, discussed risks of blister formation, small wound, skin dyspigmentation, or rare scar following cryotherapy. Recommend Vaseline ointment to treated areas while healing.   Lentigines - Scattered tan macules scalp - Due to sun exposure - Benign-appering, observe - Recommend daily broad spectrum sunscreen SPF 30+ to sun-exposed areas, reapply every 2 hours as needed.  Wear hat when outdoors - Call for any changes   Return in about 2 months (around 12/28/2020) for recheck hand.  I, Emelia [place]isbury, CMA, am acting as scribe for Brendolyn Patty, MD.  Documentation: I have reviewed the above documentation for accuracy and completeness, and I agree with the above.  Brendolyn Patty MD

## 2020-10-28 NOTE — Progress Notes (Signed)
[facility] Consult Note Telephone: [phone]  Fax: [phone]    Date of encounter: 10/28/20 9:21 PM PATIENT NAME: [name] [address] [place] [address]   [phone] (home)  DOB: Nov 29, 1932 MRN: [id number] PRIMARY CARE PROVIDER:    [name], DO,  [address] [place] [address] [phone] RESPONSIBLE PARTY:    Contact Information     Name Relation Home Work Hazel Spouse [phone]  [phone]      I met face to face with patient and family in home. Palliative Care was asked to follow this patient by consultation request of  [name] * to address advance care planning and complex medical decision making. This is a follow up visit.                                   ASSESSMENT AND PLAN / RECOMMENDATIONS: Symptom Management/Plan: 1. Advance Care Planning; Wishes are to be DNR   2. Memory impairment secondary to dementia, continue to monitor. FAST 6A   3. Protein calorie malnutrition likely secondary to dementia, dysphagia. Will continue to monitor weights. Ongoing weight loss, discussed nutrition. Mr and Mrs Caspers continue to eat out multiple meals daily. They do eat breakfast at home.    4. Insomnia/visual hallucinations-reviewed sleep patterns, sleep hygiene. Revisited continues to have "bad" dreams, tormented as well as difficulty falling, staying asleep. Mr. Feick endorses visual hallucinations worsening qhs. We talked about option of trying low dose seroquel. Mr and Mrs. Clayburn was in agreement. Discussed will start slow.   Rx: Seroquel 59m take 1/2 tablet (12.516m qhs; #30; no RF   5. Constipation- Talked about bowel patterns, bowel regimen, improved with senna s. Discussed nutrition. Mrs. Seith requested senna rx sent.   Rx: senna s; 1 po bid; #90; 1 RF escribed   6. Palliative care encounter; Palliative care encounter; Palliative medicine team will continue to  support patient, patient's family, and medical team. Visit consisted of counseling and education dealing with the complex and emotionally intense issues of symptom management and palliative care in the setting of serious and potentially life-threatening illness  Follow up Palliative Care Visit: Palliative care will continue to follow for complex medical decision making, advance care planning, and clarification of goals. Return 2 weeks or prn.  I spent 63 minutes providing this consultation. More than 50% of the time in this consultation was spent in counseling and care coordination.  PPS: 50%  Chief Complaint: Follow up palliative consult for complex medical decision making  HISTORY OF PRESENT ILLNESS:  WiVOYD GROFTs a [age].o. year old male  with  multiple medical problems including CAD s/p CABG, afib, HTN, PCM, chronic back pain, low testosterone (on replacement). I called Mrs WaBundreno confirm PC f/u visit, agreed. I visited Mr and Mrs WaKisseln their home. We talked about how Mr. Kolakowski has been feeling. Mr WaBovenzindorses he continues to have difficulty with weight, nutrition, dysphagia. We talked about eating meals out at restaurants. Mr. WaScaliandorses he will order something he thinks he would like but then when arrives not hungry. We talked about nutrition. We talked about bowel pattern, bowel regimen. We talked about senna s. Mrs. Nicholls requested 90 days rx. We talked about pain, pain regimen. We talked about weakness no recent falls. Mr. WaFreunds ambulatory, walker at times. We talked  about ability to bath, dress. Mrs Whidbee asked about a shower chair. We talked about seeing if insurance will cover. We talked about cognitive changes, worsening with some confusion. We talked about hallucinations, visual. We talked about dreams, bad tormenting as well as challenges with hallucinations which seem to be worsening. We talked about the option of trying low dose seroquel. Mr and Mrs Tones in agreement. Rx sent.  We talked about medical goals. We talked about with weight loss, overall decline, decompensation may be eligible for Hospice benefit through Medicare. Mrs. Poarch endorses he father has Hospice and they are not ready for that for Mr Radu at this time. We talked about role pc in poc. Mr and Mrs Cordell wish to continue with PC. 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 Diffuse aortic Atherosclerosis. Reviewed notes with Mr and Mrs Trouten. Mrs Haack was asking if f/u should be done. Discussed will send a note to Dr Parks Ranger to review to further discuss with Mr/Mrs Gersten. Mrs. Bellemare in agreement. Talked about f/u pc visit with pc rn to call in 1 week, then pc np in person visit 4 weeks or sooner if declines. Mrs Thoennes in agreement, scheduled. Therapeutic listening, emotional support provided. Questions answered.   History obtained from review of EMR, discussion with Mrs and Mr. Alfrey.  I reviewed available labs, medications, imaging, studies and related documents from the EMR.  Records reviewed and summarized above.   ROS Full 10 system review of systems performed and negative with exception of: as per HPI.   Physical Exam: Constitutional: NAD General: frail appearing, thin, pleasant male EYES: lids intact ENMT: oral mucous membranes moist CV: S1S2, RRR Pulmonary: LCTA, no increased work of breathing, no cough, room air Abdomen:  normo-active BS + 4 quadrants, soft and non tender MSK: ambulatory with walker; muscle wasting Skin: warm and dry, no rashes or wounds on visible skin Neuro:  + generalized weakness,  + cognitive impairment Psych: non-anxious affect, A and O x 3  Questions and concerns were addressed. The patient/family was encouraged to call with questions and/or concerns. My contact information was provided. Provided general support and encouragement, no other  unmet needs identified   Thank you for the opportunity to participate in the care of [name].  The palliative care team will continue to follow. Please call our office at [phone] if we can be of additional assistance.   This chart was dictated using voice recognition software.  Despite best efforts to proofread,  errors can occur which can change the documentation meaning.   Cristina Mattern Z Destynee Stringfellow, NP   COVID-19 PATIENT SCREENING TOOL Asked and negative response unless otherwise noted:   Have you had symptoms of covid, tested positive or been in contact with someone with symptoms/positive test in the past 5-10 days? NO

## 2020-11-03 ENCOUNTER — Telehealth: Payer: Self-pay

## 2020-11-03 NOTE — Telephone Encounter (Signed)
411 pm.  Incoming call from Cindy Hazy to provide an update on patient.  Seroquel is not being used.  Patient read the side effects of this medication and decided against taking it.  Weight has been relatively stable at 140 lbs.  Stool softner is being used daily and constipation issues have resolved.  [name] feels overall mood has improved during the daytime and she attributes this to vitamins that are being used.   No new issus at this time.  [name] states she had a virtual scheduled with Natalia Leatherwood, NP for Friday and asked if this was still needed since I have called today.  Advised that I would follow up to let NP know this call was completed today and no follow up is needed for Friday.

## 2020-11-03 NOTE — Telephone Encounter (Signed)
213 pm.  Request received from [place], NP to follow up with patient after start of Seroquel to address insomnia, hallucinations and night terrors.  Phone call made to wife Neoma Laming.  No answer but message has been left requesting a call back.

## 2020-11-09 ENCOUNTER — Ambulatory Visit (INDEPENDENT_AMBULATORY_CARE_PROVIDER_SITE_OTHER): Payer: PPO

## 2020-11-09 ENCOUNTER — Other Ambulatory Visit: Payer: Self-pay

## 2020-11-09 DIAGNOSIS — E291 Testicular hypofunction: Secondary | ICD-10-CM

## 2020-11-09 MED ORDER — TESTOSTERONE CYPIONATE 200 MG/ML IM SOLN
60.0000 mg | Freq: Once | INTRAMUSCULAR | Status: AC
  Administered 2020-11-09: 60 mg via INTRAMUSCULAR

## 2020-11-09 NOTE — Progress Notes (Signed)
Testosterone IM Injection  Due to Hypogonadism patient is present today for a Testosterone Injection.  Medication: Testosterone Cypionate Dose: 0.21ml Location: right upper outer buttocks Lot: [id number] Exp:03/24  Patient tolerated well, no complications were noted  Performed by: Fonnie Jarvis, CMA  Follow up: 2wk injection

## 2020-11-20 DIAGNOSIS — H401121 Primary open-angle glaucoma, left eye, mild stage: Secondary | ICD-10-CM | POA: Diagnosis not present

## 2020-11-23 ENCOUNTER — Other Ambulatory Visit: Payer: Self-pay

## 2020-11-23 ENCOUNTER — Ambulatory Visit (INDEPENDENT_AMBULATORY_CARE_PROVIDER_SITE_OTHER): Payer: PPO

## 2020-11-23 DIAGNOSIS — E291 Testicular hypofunction: Secondary | ICD-10-CM | POA: Diagnosis not present

## 2020-11-23 MED ORDER — TESTOSTERONE CYPIONATE 200 MG/ML IM SOLN
60.0000 mg | Freq: Once | INTRAMUSCULAR | Status: AC
  Administered 2020-11-23: 60 mg via INTRAMUSCULAR

## 2020-11-23 NOTE — Progress Notes (Signed)
Testosterone IM Injection  Due to Hypogonadism patient is present today for a Testosterone Injection.  Medication: Testosterone Cypionate Dose: 0.8ml Location: left upper outer buttocks Lot: [id number] Exp:03/2022  Patient tolerated well, no complications were noted  Performed by: Fonnie Jarvis, CMA  Follow up: 2wk injection

## 2020-11-24 ENCOUNTER — Other Ambulatory Visit: Payer: PPO | Admitting: Nurse Practitioner

## 2020-11-27 ENCOUNTER — Other Ambulatory Visit: Payer: Self-pay

## 2020-11-30 ENCOUNTER — Other Ambulatory Visit: Payer: Self-pay

## 2020-11-30 ENCOUNTER — Other Ambulatory Visit: Payer: PPO

## 2020-11-30 DIAGNOSIS — E291 Testicular hypofunction: Secondary | ICD-10-CM

## 2020-12-01 ENCOUNTER — Encounter: Payer: Self-pay | Admitting: *Deleted

## 2020-12-01 LAB — TESTOSTERONE: Testosterone: 814 ng/dL (ref 264–916)

## 2020-12-01 LAB — HEMATOCRIT: Hematocrit: 38.5 % (ref 37.5–51.0)

## 2020-12-01 LAB — PSA: Prostate Specific Ag, Serum: <0.1 ng/mL (ref 0.0–4.0)

## 2020-12-02 ENCOUNTER — Other Ambulatory Visit: Payer: PPO | Admitting: Nurse Practitioner

## 2020-12-02 ENCOUNTER — Encounter: Payer: Self-pay | Admitting: Nurse Practitioner

## 2020-12-02 ENCOUNTER — Other Ambulatory Visit: Payer: Self-pay

## 2020-12-02 DIAGNOSIS — R441 Visual hallucinations: Secondary | ICD-10-CM

## 2020-12-02 DIAGNOSIS — Z515 Encounter for palliative care: Secondary | ICD-10-CM | POA: Diagnosis not present

## 2020-12-02 DIAGNOSIS — E440 Moderate protein-calorie malnutrition: Secondary | ICD-10-CM

## 2020-12-02 DIAGNOSIS — G4700 Insomnia, unspecified: Secondary | ICD-10-CM

## 2020-12-02 NOTE — Progress Notes (Signed)
[facility] Consult Note Telephone: [phone]  Fax: [phone]    Date of encounter: 12/02/20 11:43 AM PATIENT NAME: [name] [address] [place] [address]   [phone] (home)  DOB: 1932/11/23 MRN: [id number] PRIMARY CARE PROVIDER:    [name], DO,  [place] [address] [phone] RESPONSIBLE PARTY:    Contact Information     Name Relation Home Work Maguayo Spouse [phone]  [phone]      Due to the COVID-19 crisis, this visit was done via telemedicine from my office and it was initiated and consent by this patient and or family.  I connected with  Darrell Crochet with [name] on 12/02/20 by telephone as video not available enabled telemedicine application and verified that I am speaking with the correct person.   I discussed the limitations of evaluation and management by telemedicine. The patient expressed understanding and agreed to proceed.  Palliative Care was asked to follow this patient by consultation request of  [name] * to address advance care planning and complex medical decision making. This is a follow up visit.                                  ASSESSMENT AND PLAN / RECOMMENDATIONS:  Symptom Management/Plan: 1. Advance Care Planning; Wishes are to be DNR   2. Memory impairment secondary to dementia, continue to monitor. FAST 6A   3. Protein calorie malnutrition likely secondary to dementia, dysphagia. Will continue to monitor weights. weights seem to have leveled off discussed nutrition. Darrell and Darrell Holberg continue to eat out multiple meals daily. They do eat breakfast at home.    4. Insomnia/visual hallucinations-reviewed sleep patterns, sleep hygiene. Revisited continues to have hallucinations. Has not started seroquel after reading side effects. [name] endorses when [name] takes medications it seems to enhance  hallucinations. Hallucinations have appeared to improve   5. Palliative care encounter; Palliative care encounter; Palliative medicine team will continue to support patient, patient's family, and medical team. Visit consisted of counseling and education dealing with the complex and emotionally intense issues of symptom management and palliative care in the setting of serious and potentially life-threatening illness  Follow up Palliative Care Visit: Palliative care will continue to follow for complex medical decision making, advance care planning, and clarification of goals. Return 8 weeks or prn.  I spent 27 minutes providing this consultation. More than 50% of the time in this consultation was spent in counseling and care coordination  PPS: 50%  Chief Complaint: Follow up palliative consult for complex medical decision making  HISTORY OF PRESENT ILLNESS:  [name] is a 87 y.o. year old male  with multiple medical problems including CAD s/p CABG, afib, HTN, PCM, chronic back pain, low testosterone (on replacement). I called [name] to confirm PC f/u visit, agreed. I visited [name] in their home. We talked about how [name] has been feeling. [name] endorses he continues to have difficulty with weight, nutrition, dysphagia. We talked about eating meals out at restaurants. [name] endorses he will order something he thinks he would like but then when arrives not hungry. We talked about nutrition. Does not appear to have lost further weights or clothes size. We talked about upcoming Thanksgiving. We talked about no recent hospitalizations, infections. We  talked extensively about insomnia, hallucinations. We talked about sleep patterns, sleep hygiene. We talked about medical goals. Talked about role pc in poc. Discussed f/u pc visit, scheduled. Therapeutic listening, emotional support provided. Questions answered.   History obtained from review of EMR, discussion with Darrell and [name].   I reviewed available labs, medications, imaging, studies and related documents from the EMR.  Records reviewed and summarized above.   ROS Full 10 system review of systems performed and negative with exception of: as per HPI.   Physical Exam: deferred  Questions and concerns were addressed. The patient/family was encouraged to call with questions and/or concerns. My contact information was provided. Provided general support and encouragement, no other unmet needs identified   Thank you for the opportunity to participate in the care of [name].  The palliative care team will continue to follow. Please call our office at [phone] if we can be of additional assistance.   This chart was dictated using voice recognition software.  Despite best efforts to proofread,  errors can occur which can change the documentation meaning.   Jaretzi Droz Ihor Gully, NP

## 2020-12-07 ENCOUNTER — Ambulatory Visit (INDEPENDENT_AMBULATORY_CARE_PROVIDER_SITE_OTHER): Payer: PPO

## 2020-12-07 ENCOUNTER — Other Ambulatory Visit: Payer: Self-pay

## 2020-12-07 DIAGNOSIS — E291 Testicular hypofunction: Secondary | ICD-10-CM

## 2020-12-07 MED ORDER — TESTOSTERONE CYPIONATE 200 MG/ML IM SOLN
60.0000 mg | Freq: Once | INTRAMUSCULAR | Status: AC
  Administered 2020-12-07: 60 mg via INTRAMUSCULAR

## 2020-12-07 NOTE — Progress Notes (Signed)
Testosterone IM Injection  Due to Hypogonadism patient is present today for a Testosterone Injection.  Medication: Testosterone Cypionate Dose: 60mg  Location: left upper outer buttocks Lot: [id number] Exp:03/2022  Patient tolerated well, no complications were noted.  Performed by: Gordy Clement, CMA   Follow up: RTC in 14 days for testosterone injection

## 2020-12-13 ENCOUNTER — Encounter: Payer: Self-pay | Admitting: Urology

## 2020-12-21 ENCOUNTER — Ambulatory Visit: Payer: PPO | Admitting: Dermatology

## 2020-12-21 ENCOUNTER — Ambulatory Visit (INDEPENDENT_AMBULATORY_CARE_PROVIDER_SITE_OTHER): Payer: PPO | Admitting: *Deleted

## 2020-12-21 ENCOUNTER — Other Ambulatory Visit: Payer: Self-pay

## 2020-12-21 DIAGNOSIS — E291 Testicular hypofunction: Secondary | ICD-10-CM | POA: Diagnosis not present

## 2020-12-21 MED ORDER — TESTOSTERONE CYPIONATE 200 MG/ML IM SOLN
60.0000 mg | Freq: Once | INTRAMUSCULAR | Status: AC
  Administered 2020-12-21: 60 mg via INTRAMUSCULAR

## 2020-12-21 NOTE — Progress Notes (Signed)
Testosterone IM Injection  Due to Hypogonadism patient is present today for a Testosterone Injection.  Medication: Testosterone Cypionate Dose: 0.78ml Location: right upper outer buttocks Lot: [id number] Exp:03/24  Patient tolerated well, no complications were noted  Performed by: Verlene Mayer, CMA  Follow up: 2 weeks as scheduled

## 2020-12-22 ENCOUNTER — Encounter: Payer: Self-pay | Admitting: Urology

## 2020-12-31 ENCOUNTER — Ambulatory Visit: Payer: PPO

## 2021-01-05 ENCOUNTER — Ambulatory Visit: Payer: PPO | Admitting: Dermatology

## 2021-01-05 ENCOUNTER — Other Ambulatory Visit: Payer: Self-pay

## 2021-01-05 ENCOUNTER — Ambulatory Visit (INDEPENDENT_AMBULATORY_CARE_PROVIDER_SITE_OTHER): Payer: PPO | Admitting: *Deleted

## 2021-01-05 ENCOUNTER — Other Ambulatory Visit: Payer: Self-pay | Admitting: *Deleted

## 2021-01-05 DIAGNOSIS — E291 Testicular hypofunction: Secondary | ICD-10-CM | POA: Diagnosis not present

## 2021-01-05 DIAGNOSIS — L719 Rosacea, unspecified: Secondary | ICD-10-CM

## 2021-01-05 DIAGNOSIS — D1801 Hemangioma of skin and subcutaneous tissue: Secondary | ICD-10-CM | POA: Diagnosis not present

## 2021-01-05 DIAGNOSIS — L570 Actinic keratosis: Secondary | ICD-10-CM

## 2021-01-05 DIAGNOSIS — D485 Neoplasm of uncertain behavior of skin: Secondary | ICD-10-CM

## 2021-01-05 MED ORDER — METRONIDAZOLE 0.75 % EX GEL: CUTANEOUS | 2 refills | Status: DC

## 2021-01-05 MED ORDER — TESTOSTERONE CYPIONATE 200 MG/ML IM SOLN
60.0000 mg | Freq: Once | INTRAMUSCULAR | Status: AC
  Administered 2021-01-05: 60 mg via INTRAMUSCULAR

## 2021-01-05 NOTE — Progress Notes (Signed)
Follow-Up Visit   Subjective  [name] is a 87 y.o. male who presents for the following: Follow-up (Patient here today to recheck AK at right hand treated with LN2. Patient advises it has cleared. There is a dark spot at right neck that he would like checked. ).  He also gets pink bumps on his nose that he tends to pick at.  Patient accompanied by wife.   The following portions of the chart were reviewed this encounter and updated as appropriate:       Review of Systems:  No other skin or systemic complaints except as noted in HPI or Assessment and Plan.  Objective  Well appearing patient in no apparent distress; mood and affect are within normal limits.  All skin waist up examined.  Right Hand Dorsum Small residual keratotic papule  right clavicle 3 mm dark blue firm papule Blue Nevus vs Hemangioma r/o Atypia     face Pink papules at nose    Assessment & Plan  AK (actinic keratosis) Right Hand Dorsum  Actinic keratoses are precancerous spots that appear secondary to cumulative UV radiation exposure/sun exposure over time. They are chronic with expected duration over 1 year. A portion of actinic keratoses will progress to squamous cell carcinoma of the skin. It is not possible to reliably predict which spots will progress to skin cancer and so treatment is recommended to prevent development of skin cancer.  Recommend daily broad spectrum sunscreen SPF 30+ to sun-exposed areas, reapply every 2 hours as needed.  Recommend staying in the shade or wearing long sleeves, sun glasses (UVA+UVB protection) and wide brim hats (4-inch brim around the entire circumference of the hat). Call for new or changing lesions.   Destruction of lesion - Right Hand Dorsum  Destruction method: cryotherapy   Informed consent: discussed and consent obtained   Lesion destroyed using liquid nitrogen: Yes   Region frozen until ice ball extended beyond lesion: Yes   Outcome: patient  tolerated procedure well with no complications   Post-procedure details: wound care instructions given   Additional details:  Prior to procedure, discussed risks of blister formation, small wound, skin dyspigmentation, or rare scar following cryotherapy. Recommend Vaseline ointment to treated areas while healing.   Neoplasm of uncertain behavior of skin right clavicle  Epidermal / dermal shaving  Lesion diameter (cm):  0.3 Informed consent: discussed and consent obtained   Patient was prepped and draped in usual sterile fashion: Area prepped with alcohol. Anesthesia: the lesion was anesthetized in a standard fashion   Local anesthetic: 0.25 % bupivicaine. Instrument used: flexible razor blade   Hemostasis achieved with: pressure, aluminum chloride and electrodesiccation   Outcome: patient tolerated procedure well   Post-procedure details: wound care instructions given   Post-procedure details comment:  Ointment and small bandage applied.   Specimen 1 - Surgical pathology Differential Diagnosis: Blue Nevus vs Hemangioma r/o Atypia  Check Margins: No 3 mm dark blue firm papule   Rosacea face  Rosacea is a chronic progressive skin condition usually affecting the face of adults, causing redness and/or acne bumps. It is treatable but not curable. It sometimes affects the eyes (ocular rosacea) as well. It may respond to topical and/or systemic medication and can flare with stress, sun exposure, alcohol, exercise and some foods.  Daily application of broad spectrum spf 30+ sunscreen to face is recommended to reduce flares.  Start metronidazole 0.75% gel one to two times daily  metroNIDAZOLE (METROGEL) 0.75 % gel -  face Apply 1-2 times daily to face.   Return if symptoms worsen or fail to improve.  Graciella Belton, RMA, am acting as scribe for Brendolyn Patty, MD .  Documentation: I have reviewed the above documentation for accuracy and completeness, and I agree with the  above.  Brendolyn Patty MD

## 2021-01-05 NOTE — Patient Instructions (Addendum)
Cryotherapy Aftercare  Wash gently with soap and water everyday.   Apply Vaseline and Band-Aid daily until healed.    Wound Care Instructions  Cleanse wound gently with soap and water once a day then pat dry with clean gauze. Apply a thing coat of Petrolatum (petroleum jelly, "Vaseline") over the wound (unless you have an allergy to this). We recommend that you use a new, sterile tube of Vaseline. Do not pick or remove scabs. Do not remove the yellow or white "healing tissue" from the base of the wound.  Cover the wound with fresh, clean, nonstick gauze and secure with paper tape. You may use Band-Aids in place of gauze and tape if the would is small enough, but would recommend trimming much of the tape off as there is often too much. Sometimes Band-Aids can irritate the skin.  You should call the office for your biopsy report after 1 week if you have not already been contacted.  If you experience any problems, such as abnormal amounts of bleeding, swelling, significant bruising, significant pain, or evidence of infection, please call the office immediately.   If You Need Anything After Your Visit  If you have any questions or concerns for your doctor, please call our main line at [phone] and press option 4 to reach your doctor's medical assistant. If no one answers, please leave a voicemail as directed and we will return your call as soon as possible. Messages left after 4 pm will be answered the following business day.   You may also send [place] a message via [place]. We typically respond to MyChart messages within 1-2 business days.  For prescription refills, please ask your pharmacy to contact our office. Our fax number is [phone].  If you have an urgent issue when the clinic is closed that cannot wait until the next business day, you can page your doctor at the number below.    Please note that while we do our best to be available for urgent issues outside of office hours, we  are not available 24/7.   If you have an urgent issue and are unable to reach [place], you may choose to seek medical care at your doctor's office, retail clinic, urgent care center, or emergency room.  If you have a medical emergency, please immediately call 911 or go to the emergency department.  Pager Numbers  - Dr. Nehemiah Massed: [phone]  - Dr. Laurence Ferrari: [phone]  - Dr. Nicole Kindred: [phone]  In the event of inclement weather, please call our main line at [phone] for an update on the status of any delays or closures.  Dermatology Medication Tips: Please keep the boxes that topical medications come in in order to help keep track of the instructions about where and how to use these. Pharmacies typically print the medication instructions only on the boxes and not directly on the medication tubes.   If your medication is too expensive, please contact our office at [phone] option 4 or send [place] a message through [place].   We are unable to tell what your co-pay for medications will be in advance as this is different depending on your insurance coverage. However, we may be able to find a substitute medication at lower cost or fill out paperwork to get insurance to cover a needed medication.   If a prior authorization is required to get your medication covered by your insurance company, please allow [place] 1-2 business days to complete this process.  Drug prices often vary depending on where  the prescription is filled and some pharmacies may offer cheaper prices.  The website www.goodrx.com contains coupons for medications through different pharmacies. The prices here do not account for what the cost may be with help from insurance (it may be cheaper with your insurance), but the website can give you the price if you did not use any insurance.  - You can print the associated coupon and take it with your prescription to the pharmacy.  - You may also stop by our office during regular business  hours and pick up a GoodRx coupon card.  - If you need your prescription sent electronically to a different pharmacy, notify our office through [facility] or by phone at [phone] option 4.     Si Usted Necesita Algo Despus de Su Visita  Tambin puede enviarnos un mensaje a travs de Pharmacist, community. Por lo general respondemos a los mensajes de MyChart en el transcurso de 1 a 2 das hbiles.  Para renovar recetas, por favor pida a su farmacia que se ponga en contacto con nuestra oficina. Harland Dingwall de fax es [facility] [phone].  Si tiene un asunto urgente cuando la clnica est cerrada y que no puede esperar hasta el siguiente da hbil, puede llamar/localizar a su doctor(a) al nmero que aparece a continuacin.   Por favor, tenga en cuenta que aunque hacemos todo lo posible para estar disponibles para asuntos urgentes fuera del horario de [place], no estamos disponibles las 24 horas del da, los 7 das de la [place].   Si tiene un problema urgente y no puede comunicarse con nosotros, puede optar por buscar atencin mdica  en el consultorio de su doctor(a), en una clnica privada, en un centro de atencin urgente o en una sala de emergencias.  Si tiene Engineering geologist, por favor llame inmediatamente al 911 o vaya a la sala de emergencias.  Nmeros de bper  - Dr. Nehemiah Massed: [phone]  - Dra. Moye: [phone]  - Dra. Nicole Kindred: [phone]  En caso de inclemencias del [place], por favor llame a Johnsie Kindred principal al [phone] para una actualizacin sobre el [place] de cualquier retraso o cierre.  Consejos para la medicacin en dermatologa: Por favor, guarde las cajas en las que vienen los medicamentos de uso tpico para ayudarle a seguir las instrucciones sobre dnde y cmo usarlos. Las farmacias generalmente imprimen las instrucciones del medicamento slo en las cajas y no directamente en los tubos del [place].   Si su medicamento es muy caro, por favor,  pngase en contacto con Zigmund Daniel llamando al [phone] y presione la opcin 4 o envenos un mensaje a travs de Pharmacist, community.   No podemos decirle cul ser su copago por los medicamentos por adelantado ya que esto es diferente dependiendo de la cobertura de su seguro. Sin embargo, es posible que podamos encontrar un medicamento sustituto a Electrical engineer un formulario para que el seguro cubra el medicamento que se considera necesario.   Si se requiere una autorizacin previa para que su compaa de seguros [place] su medicamento, por favor permtanos de 1 a 2 das hbiles para completar este proceso.  Los precios de los medicamentos varan con frecuencia dependiendo del Environmental consultant de dnde se surte la receta y alguna farmacias pueden ofrecer precios ms baratos.  El sitio web www.goodrx.com tiene cupones para medicamentos de Airline pilot. Los precios aqu no tienen en cuenta lo que podra costar con la ayuda del seguro (puede ser ms barato con su seguro), pero el sitio  web puede darle el precio si no Field seismologist.  - Puede imprimir el cupn correspondiente y llevarlo con su receta a la farmacia.  - Tambin puede pasar por nuestra oficina durante el horario de atencin regular y Charity fundraiser una tarjeta de cupones de GoodRx.  - Si necesita que su receta se enve electrnicamente a una farmacia diferente, informe a nuestra oficina a travs de MyChart de [place] o por telfono llamando al [phone] y presione la opcin 4.

## 2021-01-05 NOTE — Progress Notes (Signed)
Testosterone IM Injection  Due to Hypogonadism patient is present today for a Testosterone Injection.  Medication: Testosterone Cypionate Dose: 0.77ml Location: left upper outer buttocks Lot: [id number] Exp:03/2022  Patient tolerated well, no complications were noted  Performed by: Verlene Mayer, CMA  Follow up: Two weeks

## 2021-01-07 ENCOUNTER — Other Ambulatory Visit: Payer: Self-pay

## 2021-01-07 ENCOUNTER — Encounter: Payer: Self-pay | Admitting: Family Medicine

## 2021-01-07 ENCOUNTER — Ambulatory Visit (INDEPENDENT_AMBULATORY_CARE_PROVIDER_SITE_OTHER): Payer: PPO | Admitting: Family Medicine

## 2021-01-07 VITALS — BP 117/64 | HR 59 | Ht 74.0 in | Wt 143.0 lb

## 2021-01-07 DIAGNOSIS — I480 Paroxysmal atrial fibrillation: Secondary | ICD-10-CM | POA: Diagnosis not present

## 2021-01-07 DIAGNOSIS — G309 Alzheimer's disease, unspecified: Secondary | ICD-10-CM

## 2021-01-07 DIAGNOSIS — R7303 Prediabetes: Secondary | ICD-10-CM | POA: Diagnosis not present

## 2021-01-07 DIAGNOSIS — R1319 Other dysphagia: Secondary | ICD-10-CM

## 2021-01-07 DIAGNOSIS — I129 Hypertensive chronic kidney disease with stage 1 through stage 4 chronic kidney disease, or unspecified chronic kidney disease: Secondary | ICD-10-CM | POA: Diagnosis not present

## 2021-01-07 DIAGNOSIS — Z0000 Encounter for general adult medical examination without abnormal findings: Secondary | ICD-10-CM | POA: Diagnosis not present

## 2021-01-07 DIAGNOSIS — N183 Chronic kidney disease, stage 3 unspecified: Secondary | ICD-10-CM | POA: Diagnosis not present

## 2021-01-07 DIAGNOSIS — F02A0 Dementia in other diseases classified elsewhere, mild, without behavioral disturbance, psychotic disturbance, mood disturbance, and anxiety: Secondary | ICD-10-CM | POA: Diagnosis not present

## 2021-01-07 DIAGNOSIS — E538 Deficiency of other specified B group vitamins: Secondary | ICD-10-CM | POA: Diagnosis not present

## 2021-01-07 DIAGNOSIS — D649 Anemia, unspecified: Secondary | ICD-10-CM

## 2021-01-07 DIAGNOSIS — E440 Moderate protein-calorie malnutrition: Secondary | ICD-10-CM

## 2021-01-07 MED ORDER — TESTOSTERONE CYPIONATE 200 MG/ML IM SOLN: 60.0000 mg | INTRAMUSCULAR | 0 refills | Status: DC

## 2021-01-07 NOTE — Patient Instructions (Addendum)
Thank you for coming to the office today.  Labs today, stay tuned for results.  Keep in touch on mychart as needed.  Will get updates from [facility]  If recurrent episodes of the heart irregularity - let me know and let cardiology know for sooner apt.  Wt stable 1 lb difference in 5 months  Please schedule a Follow-up Appointment to: Return in about 6 months (around 07/08/2021) for 6 month follow-up HTN, Memory, Nutrition/Wt, Anemia.  If you have any other questions or concerns, please feel free to call the office or send a message through [place]. You may also schedule an earlier appointment if necessary.  Additionally, you may be receiving a survey about your experience at our office within a few days to 1 week by e-mail or mail. We value your feedback.  [name], DO [place]

## 2021-01-07 NOTE — Assessment & Plan Note (Signed)
Followed by Dr Rockey Situ [facility] Card[facility] Last visit 06/2020 in rhythm S/p Maze Not on anticoagulation  Had x 1 episode of irregularity with heart beat/rhythm recently, brief episode, uncertain etiology. Will monitor closely and if recurrences return to Cardiology sooner

## 2021-01-07 NOTE — Progress Notes (Signed)
Subjective:    Patient ID: [name], male    DOB: 1932/08/10, 87 y.o.   MRN: [id number]  [name] is a 87 y.o. male presenting on 01/07/2021 for Annual Exam  History provided by wife, [name] today.  HPI  Pre-Diabetes Previously controlled A1c 5.6 to 5.8 Due today for A1c CBGs: not checking Meds: none Currently not on ACEi / ARB Lifestyle: - Diet reduced intake Denies hypoglycemia, polyuria, visual changes, numbness or tingling.   Palliative Care Patient, followed by [facility]  GERD / Dysphagia w/ esophageal stricture Moderate protein calorie malnutrition secondary to dysphagia and dementia Dementia, mild, alzheimer's FAST 6A  - He had recent history of esophageal stricture on barium swallow done in 09/2017. He had EGD and esophageal stretching in 10/2017 by Dr Allen Norris  Has had repeat swallow study and determined no further intervention - At this time, still challenge with the swallowing. Sips water after each bite to help swallowing.   Loss of proximal muscle loss History of prior hip replacement Generalized weakness   Chronic Osteoarthritis, Back Pain Follow-up on chronic issue, he has tried some Tylenol Arthritis str 1-2 times a day with mild relief, Taking Meloxicam PRN. Using infrequently Using Tylenol QHS PM with good results. Followed by Orthopedic specialist. Admits issues with hips Denies any injury, fall, numbness tingling weakness   Insomnia On Mirtazapine   Paroxysmal Atrial Fibrillation S/p MAZE procedure Off Anticoagulation. Has maintained sinus rhythm He had one episode of pulse racing irregularly.   Hypogonadism, low T Followed by [facility] On Testosterone injections.    Depression screen [facility] 2/9 01/07/2021 01/14/2020 12/31/2019  Decreased Interest 0 0 0  Down, Depressed, Hopeless 0 0 0  PHQ - 2 Score 0 0 0  Altered sleeping 0 - -  Tired, decreased energy 0 - -  Change in appetite 0 - -  Feeling bad or failure about yourself   0 - -  Trouble concentrating 0 - -  Moving slowly or fidgety/restless 0 - -  Suicidal thoughts 0 - -  PHQ-9 Score 0 - -  Difficult doing work/chores Not difficult at all - -  Some recent data might be hidden    Past Medical History:  Diagnosis Date   Arthritis    Coronary artery disease    a. 1993 s/p PCI/BMS LAD;  b. 2003 PCI RCA;  c. 09/2006 ISR LAD->PCI, PCI RCA;  d. 02/2007 MV: no ischemia;  d. 01/2012 MV: EF 74%, no ischemia.   History of prostate cancer    Hyperlipidemia    Hyperthyroidism    Low testosterone    PAF (paroxysmal atrial fibrillation) ([place])    a. previously on amio ->stopped in 2013;  b. recurrent PAF 01/2012->Pradaxa and amio initiated, subsequently came off of pradaxa 2/2 cost;  c. 02/2012 Echo: EF 55-60%, Gr 1 DD.   Prostate cancer ([place])    S/P CABG x 4    Past Surgical History:  Procedure Laterality Date   APPENDECTOMY     Dr. Bary Castilla   CARDIAC CATHETERIZATION  2008   CLIPPING OF ATRIAL APPENDAGE Left 05/18/2012   Procedure: CLIPPING OF ATRIAL APPENDAGE;  Surgeon: [name], MD;  Location: [place];  Service: Open Heart Surgery;  Laterality: Left;   COLONOSCOPY  2013   COLONOSCOPY WITH PROPOFOL N/A 07/12/2016   Procedure: COLONOSCOPY WITH PROPOFOL;  Surgeon: [name], MD;  Location: [facility] ENDOSCOPY;  Service: Endoscopy;  Laterality: N/A;   CORONARY ARTERY BYPASS GRAFT N/A 05/18/2012  Procedure: CORONARY ARTERY BYPASS GRAFTING (CABG);  Surgeon: [name], MD;  Location: [place];  Service: Open Heart Surgery;  Laterality: N/A;  Times 4 using left internal mammary artery and endoscopically harvested right saphenous vein   ESOPHAGOGASTRODUODENOSCOPY (EGD) WITH PROPOFOL N/A 10/24/2017   Procedure: ESOPHAGOGASTRODUODENOSCOPY (EGD) WITH PROPOFOL;  Surgeon: [name], MD;  Location: [facility] ENDOSCOPY;  Service: Endoscopy;  Laterality: N/A;   HERNIA REPAIR     s/p mesh bilaterally, Dr. Bary Castilla   INTRAOPERATIVE TRANSESOPHAGEAL ECHOCARDIOGRAM N/A 05/18/2012    Procedure: INTRAOPERATIVE TRANSESOPHAGEAL ECHOCARDIOGRAM;  Surgeon: [name], MD;  Location[place];  Service: Open Heart Surgery;  Laterality: N/A;   IR ESOPHAGUS DILITATION RETRO FLUORO     LEFT HEART CATHETERIZATION WITH CORONARY ANGIOGRAM Bilateral 05/16/2012   Procedure: LEFT HEART CATHETERIZATION WITH CORONARY ANGIOGRAM;  Surgeon: Burnell Blanks, MD;  Location[facility] C[facility] LAB;  Service: Cardiovascular;  Laterality: Bilateral;   MAZE N/A 05/18/2012   Procedure: MAZE;  Surgeon: [name], MD;  Location: [place];  Service: Open Heart Surgery;  Laterality: N/A;   MOHS SURGERY     PROSTATECTOMY     TOTAL HIP ARTHROPLASTY Right    Dr. 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 He was born in [place] and went to [place] in [place]. Daughter in [place], 2 step-daughters. Retired from [employer].   Social Determinants of Health   Financial Resource Strain: Low Risk    Difficulty of Paying Living Expenses: Not hard at all  Food Insecurity: No Food Insecurity   Worried About Charity fundraiser in the Last Year: Never true   [place] in the Last Year: Never true  Transportation Needs: No  Transportation Needs   Lack of Transportation (Medical): No   Lack of Transportation (Non-Medical): No  Physical Activity: Insufficiently Active   Days of Exercise per Week: 7 days   Minutes of Exercise per Session: 20 min  Stress: No Stress Concern Present   Feeling of Stress : Not at all  Social Connections: Not on file  Intimate Partner Violence: Not on file   Family History  Problem Relation Age of Onset   Coronary artery disease Mother    Heart disease Mother    Coronary artery disease Sister    Diabetes Sister    Heart disease Sister 36       deceased   Diabetes Father    Heart disease Brother    Current Outpatient Medications on File Prior to Visit  Medication Sig  Acetaminophen (TYLENOL ARTHRITIS PAIN PO) Take by mouth as needed.   amLODipine (NORVASC) 5 MG tablet Take 1 tablet (5 mg total) by mouth daily.   aspirin EC 81 MG tablet Take 81 mg by mouth every evening.   bisacodyl (DULCOLAX) 10 MG suppository Place 10 mg rectally daily as needed for moderate constipation.   Cholecalciferol (VITAMIN D3) 1.25 MG (50000 UT) CAPS Take by mouth daily.   dorzolamide-timolol (COSOPT) 22.3-6.8 MG/ML ophthalmic solution 1 drop 2 (two) times daily.   ezetimibe (ZETIA) 10 MG tablet Take 1 tablet (10 mg total) by mouth daily.   fluticasone (FLONASE) 50 MCG/ACT nasal spray SPRAY TWO SPRAYS IN EACH NOSTRIL ONCE DAILY - USE FOR 4-6 WEEKS THEN STOP AND USE SEASONALLY OR AS NEEDED   hydrocortisone 2.5 % cream APPLY A THIN LAYER TO FACE TWICE A DAY AS NEEDED FOR 1 TO 2 WEEKS   latanoprost (XALATAN) 0.005 % ophthalmic solution 0.005 drops daily.   meloxicam (MOBIC) 15 MG tablet Take 15 mg by mouth as needed for pain.   metroNIDAZOLE (METROGEL) 0.75 % gel Apply 1-2 times daily to face.   mirtazapine (REMERON) 15 MG tablet Take 1 tablet (15 mg total) by mouth at bedtime.   polyethylene glycol powder (GLYCOLAX/MIRALAX) 17 GM/SCOOP powder Take 17-34 g by mouth daily. For constipation    simvastatin (ZOCOR) 20 MG tablet Take 1 tablet (20 mg total) by mouth daily in the afternoon.   testosterone cypionate (DEPOTESTOSTERONE CYPIONATE) 200 MG/ML injection Inject 0.3 mLs (60 mg total) into the muscle every 14 (fourteen) days.   timolol (TIMOPTIC) 0.5 % ophthalmic solution Place 1 drop into both eyes daily.    No current facility-administered medications on file prior to visit.    Review of Systems Per HPI unless specifically indicated above     Objective:    BP 117/64    Pulse (!) 59    Ht 6\' 2"  (1.88 m)    Wt 143 lb (64.9 kg)    SpO2 100%    BMI 18.36 kg/m   Wt Readings from Last 3 Encounters:  01/07/21 143 lb (64.9 kg)  08/05/20 144 lb 6.4 oz (65.5 kg)  06/22/20 147 lb 6 oz (66.8 kg)    Physical Exam   Results for orders placed or performed in visit on 11/30/20  Hematocrit  Result Value Ref Range   Hematocrit 38.5 37.5 - 51.0 %  Testosterone  Result Value Ref Range   Testosterone 814 264 - 916 ng/dL  PSA  Result Value Ref Range   Prostate Specific Ag, Serum <0.1 0.0 - 4.0 ng/mL      Assessment & Plan:   Problem List Items Addressed This Visit     Pre-diabetes   Relevant Orders   Hemoglobin A1c   Paroxysmal atrial fibrillation ([place])    Followed by Dr Rockey Situ [facility] Cardiology Last visit 06/2020 in rhythm S/p Maze Not on anticoagulation  Had x 1 episode of irregularity with heart beat/rhythm recently, brief episode, uncertain etiology. Will monitor closely and if recurrences return to Cardiology sooner       Moderate protein-calorie malnutrition ([place])   Relevant Orders   COMPLETE METABOLIC PANEL WITH GFR   Mild dementia (HCC)   Relevant Orders   COMPLETE METABOLIC PANEL WITH GFR   TSH   Esophageal dysphagia   Benign hypertension with CKD (chronic kidney disease) stage III ([place])    Well-controlled HTN Complication with CKD III now improved on lab Cr    Plan:  1.  Continue current BP regimen  - Amlodipine 5mg  daily 2. Encourage improved  lifestyle - ow sodium diet, regular exercise 3. Continue monitor BP outside office, bring readings to next visit, if persistently >140/90 or new symptoms notify office sooner      Relevant Orders   COMPLETE METABOLIC PANEL WITH GFR   Lipid panel   CBC with Differential/Platelet   TSH   Other Visit Diagnoses     Annual physical exam    -  Primary   Relevant Orders   COMPLETE METABOLIC PANEL WITH GFR   Lipid panel   Hemoglobin A1c   CBC with Differential/Platelet   TSH   Normocytic anemia       Relevant Orders   CBC with Differential/Platelet   Iron, TIBC and Ferritin Panel   Vitamin B12 deficiency       Relevant Orders   Vitamin B12       Updated Health Maintenance information Due for fasting labs today  Palliative Care Patient, [facility]  Wt has stabilized in past 3-4 months, with 1 lb difference Encouraged maintain diet, limited by dysphagia and cognitive/dementia aspect Goal of weight gain / maintain  Anemia Will add iron panel and CBC to evaluate, had improved previously Keep close watch given limited nutrition \  No orders of the defined types were placed in this encounter.   Follow up plan: Return in about 6 months (around 07/08/2021) for 6 month follow-up HTN, Memory, Nutrition/Wt, Anemia.  [name], [facility] 01/07/2021, 9:37 AM

## 2021-01-07 NOTE — Assessment & Plan Note (Signed)
Well-controlled HTN Complication with CKD III now improved on lab Cr    Plan:  1. Continue current BP regimen  - Amlodipine 5mg  daily 2. Encourage improved lifestyle - ow sodium diet, regular exercise 3. Continue monitor BP outside office, bring readings to next visit, if persistently >140/90 or new symptoms notify office sooner

## 2021-01-08 LAB — COMPLETE METABOLIC PANEL WITH GFR
AG Ratio: 1.5 (calc) (ref 1.0–2.5)
ALT: 10 U/L (ref 9–46)
AST: 15 U/L (ref 10–35)
Albumin: 4.2 g/dL (ref 3.6–5.1)
Alkaline phosphatase (APISO): 70 U/L (ref 35–144)
BUN: 19 mg/dL (ref 7–25)
CO2: 25 mmol/L (ref 20–32)
Calcium: 9.7 mg/dL (ref 8.6–10.3)
Chloride: 105 mmol/L (ref 98–110)
Creat: 1.12 mg/dL (ref 0.70–1.22)
Globulin: 2.8 g/dL (calc) (ref 1.9–3.7)
Glucose, Bld: 112 mg/dL — ABNORMAL HIGH (ref 65–99)
Potassium: 4.4 mmol/L (ref 3.5–5.3)
Sodium: 138 mmol/L (ref 135–146)
Total Bilirubin: 1.1 mg/dL (ref 0.2–1.2)
Total Protein: 7 g/dL (ref 6.1–8.1)
eGFR: 64 mL/min/{1.73_m2} (ref >=60)

## 2021-01-08 LAB — VITAMIN B12: Vitamin B-12: 1194 pg/mL — ABNORMAL HIGH (ref 200–1100)

## 2021-01-08 LAB — CBC WITH DIFFERENTIAL/PLATELET
Absolute Monocytes: 569 cells/uL (ref 200–950)
Basophils Absolute: 22 cells/uL (ref 0–200)
Basophils Relative: 0.3 %
Eosinophils Absolute: 130 cells/uL (ref 15–500)
Eosinophils Relative: 1.8 %
HCT: 40.2 % (ref 38.5–50.0)
Hemoglobin: 12.9 g/dL — ABNORMAL LOW (ref 13.2–17.1)
Lymphs Abs: 972 cells/uL (ref 850–3900)
MCH: 28.7 pg (ref 27.0–33.0)
MCHC: 32.1 g/dL (ref 32.0–36.0)
MCV: 89.3 fL (ref 80.0–100.0)
MPV: 11.6 fL (ref 7.5–12.5)
Monocytes Relative: 7.9 %
Neutro Abs: 5508 cells/uL (ref 1500–7800)
Neutrophils Relative %: 76.5 %
Platelets: 242 10*3/uL (ref 140–400)
RBC: 4.5 10*6/uL (ref 4.20–5.80)
RDW: 12.5 % (ref 11.0–15.0)
Total Lymphocyte: 13.5 %
WBC: 7.2 10*3/uL (ref 3.8–10.8)

## 2021-01-08 LAB — IRON,TIBC AND FERRITIN PANEL
%SAT: 58 % (calc) — ABNORMAL HIGH (ref 20–48)
Ferritin: 139 ng/mL (ref 24–380)
Iron: 171 ug/dL (ref 50–180)
TIBC: 295 mcg/dL (calc) (ref 250–425)

## 2021-01-08 LAB — LIPID PANEL
Cholesterol: 151 mg/dL (ref <200)
HDL: 46 mg/dL (ref >=40)
LDL Cholesterol (Calc): 89 mg/dL (calc)
Non-HDL Cholesterol (Calc): 105 mg/dL (calc) (ref <130)
Total CHOL/HDL Ratio: 3.3 (calc) (ref <5.0)
Triglycerides: 73 mg/dL (ref <150)

## 2021-01-08 LAB — HEMOGLOBIN A1C
Hgb A1c MFr Bld: 5.7 % of total Hgb — ABNORMAL HIGH (ref <5.7)
Mean Plasma Glucose: 117 mg/dL
eAG (mmol/L): 6.5 mmol/L

## 2021-01-08 LAB — TSH: TSH: 1.82 mIU/L (ref 0.40–4.50)

## 2021-01-12 ENCOUNTER — Telehealth: Payer: Self-pay

## 2021-01-12 ENCOUNTER — Ambulatory Visit: Payer: PPO

## 2021-01-12 NOTE — Telephone Encounter (Signed)
Advised patient's wife biopsy was benign and no further treatment needed.

## 2021-01-12 NOTE — Telephone Encounter (Signed)
-----   Message from Brendolyn Patty, MD sent at 01/12/2021 10:06 AM EST ----- Skin , right clavicle VENOUS LAKE WITH THROMBOSIS  Benign, superficial small vein with clot  - please call patient

## 2021-01-17 DIAGNOSIS — M7061 Trochanteric bursitis, right hip: Secondary | ICD-10-CM | POA: Diagnosis not present

## 2021-01-18 ENCOUNTER — Other Ambulatory Visit: Payer: Self-pay

## 2021-01-18 ENCOUNTER — Ambulatory Visit (INDEPENDENT_AMBULATORY_CARE_PROVIDER_SITE_OTHER): Payer: PPO | Admitting: *Deleted

## 2021-01-18 ENCOUNTER — Encounter: Payer: Self-pay | Admitting: Urology

## 2021-01-18 DIAGNOSIS — E291 Testicular hypofunction: Secondary | ICD-10-CM

## 2021-01-18 MED ORDER — TESTOSTERONE CYPIONATE 200 MG/ML IM SOLN
60.0000 mg | Freq: Once | INTRAMUSCULAR | Status: AC
  Administered 2021-01-18: 60 mg via INTRAMUSCULAR

## 2021-01-18 NOTE — Progress Notes (Signed)
Testosterone IM Injection  Due to Hypogonadism patient is present today for a Testosterone Injection.  Medication: Testosterone Cypionate Dose: 0.64ml Location: left upper outer buttocks Lot: [id number] Exp:08/2022  Patient tolerated well, no complications were noted  Performed by: Verlene Mayer, CMA  Follow up: 2 weeks

## 2021-01-19 ENCOUNTER — Ambulatory Visit: Payer: PPO

## 2021-01-22 ENCOUNTER — Ambulatory Visit: Payer: PPO

## 2021-01-27 DIAGNOSIS — M25551 Pain in right hip: Secondary | ICD-10-CM | POA: Diagnosis not present

## 2021-01-27 DIAGNOSIS — M7061 Trochanteric bursitis, right hip: Secondary | ICD-10-CM | POA: Diagnosis not present

## 2021-02-01 ENCOUNTER — Ambulatory Visit (INDEPENDENT_AMBULATORY_CARE_PROVIDER_SITE_OTHER): Payer: PPO | Admitting: *Deleted

## 2021-02-01 ENCOUNTER — Other Ambulatory Visit: Payer: Self-pay

## 2021-02-01 DIAGNOSIS — E291 Testicular hypofunction: Secondary | ICD-10-CM

## 2021-02-01 MED ORDER — TESTOSTERONE CYPIONATE 200 MG/ML IM SOLN
200.0000 mg | Freq: Once | INTRAMUSCULAR | Status: AC
  Administered 2021-02-01: 200 mg via INTRAMUSCULAR

## 2021-02-01 NOTE — Progress Notes (Signed)
Testosterone IM Injection   Due to Hypogonadism patient is present today for a Testosterone Injection.   Medication: Testosterone Cypionate Dose: 0.64ml Location: left upper outer buttocks Lot: [id number] Exp:08/2022   Patient tolerated well, no complications were noted   Performed by: Gaspar Cola  CMA   Follow up: 2 weeks

## 2021-02-03 ENCOUNTER — Telehealth: Payer: Self-pay | Admitting: Family Medicine

## 2021-02-03 ENCOUNTER — Other Ambulatory Visit: Payer: PPO | Admitting: Nurse Practitioner

## 2021-02-03 ENCOUNTER — Other Ambulatory Visit: Payer: Self-pay

## 2021-02-03 ENCOUNTER — Encounter: Payer: Self-pay | Admitting: Nurse Practitioner

## 2021-02-03 DIAGNOSIS — E440 Moderate protein-calorie malnutrition: Secondary | ICD-10-CM

## 2021-02-03 DIAGNOSIS — Z515 Encounter for palliative care: Secondary | ICD-10-CM

## 2021-02-03 NOTE — Telephone Encounter (Signed)
Patient notified Testosterone Cypionate has been approved.  Approval date: 02/02/2021-February 11, 2022

## 2021-02-03 NOTE — Progress Notes (Signed)
[name] Consult Note Telephone: [phone]  Fax: [phone]    Date of encounter: 02/03/21 4:04 PM PATIENT NAME: [name] [address] [place] [address]   [phone] (home)  DOB: 25-Oct-1932 MRN: [id number] PRIMARY CARE PROVIDER:    [name], DO,  [place] [address] [phone]  REFERRING PROVIDER:   [name], DO [address] [place],  [place] [address] [phone]  RESPONSIBLE PARTY:    Contact Information     Name Relation Home Work [place] Spouse [phone]  [phone]      Due to the COVID-19 crisis, this visit was done via telemedicine from my office and it was initiated and consent by this patient and or family.  I connected with [name] with [name] on 02/03/21 by a telephone as video not available enabled telemedicine application and verified that I am speaking with the correct person using two identifiers.   I discussed the limitations of evaluation and management by telemedicine. The patient expressed understanding and agreed to proceed.  Palliative Care was asked to follow this patient by consultation request of  [name] * to address advance care planning and complex medical decision making. This is a follow up visit.                                  ASSESSMENT AND PLAN / RECOMMENDATIONS: Symptom Management/Plan: 1. Advance Care Planning; Wishes are to be DNR   2. Memory impairment secondary to dementia, continue to monitor. FAST 6A   3. Protein calorie malnutrition likely secondary to dementia, dysphagia. Will continue to monitor weights. weights seem to have leveled off discussed nutrition. Darrell and Darrell Willis continue to eat out multiple meals daily. No new weight loss or change in clothes size   4. Palliative care encounter; Palliative care encounter; Palliative medicine team will continue to support  patient, patient's family, and medical team. Visit consisted of counseling and education dealing with the complex and emotionally intense issues of symptom management and palliative care in the setting of serious and potentially life-threatening illness  Follow up Palliative Care Visit: Palliative care will continue to follow for complex medical decision making, advance care planning, and clarification of goals. Return 8 weeks or prn.  I spent 27 minutes providing this consultation. More than 50% of the time in this consultation was spent in counseling and care coordination.  PPS: 50%  Chief Complaint: Follow up palliative consult for complex medical decision making  HISTORY OF PRESENT ILLNESS:  EDIS HUISH is a 88 y.o. year old male  with multiple medical problems including CAD s/p CABG, afib, HTN, PCM, chronic back pain, low testosterone (on replacement). I called [name] with Darrell Gandolfi for telemedicine telephone as video not available for f/u pc visit.  We talked about how Darrell. [name] has been feeling. [name] endorses [name] has had some improvement with weight, nutrition, dysphagia. Darrell and [name] continue to eating meals out at restaurants multiple times a day. [name] endorses weight 140lbs, does not appear to have lost further weight, no change in clothes size. We talked about no recent hospitalizations, infections. We talked about medical goals. Talked about role pc in poc. Discussed f/u pc visit, scheduled. Therapeutic listening, emotional support provided. Questions answered.    [name]  History obtained  from review of EMR, discussion with Darrell with Darrell. Talerico.  I reviewed available labs, medications, imaging, studies and related documents from the EMR.  Records reviewed and summarized above.   ROS 10 point ros with [name] with [name] all negative except HPI  Physical Exam: deferred Thank you for the opportunity to participate in the care of Darrell. [name].  The palliative care team will  continue to follow. Please call our office at [phone] if we can be of additional assistance.   Questions and concerns were addressed. The patient/family was encouraged to call with questions and/or concerns. My contact information was provided. Provided general support and encouragement, no other unmet needs identified   [name] Darrell Gully, NP

## 2021-02-15 ENCOUNTER — Other Ambulatory Visit: Payer: Self-pay

## 2021-02-15 ENCOUNTER — Ambulatory Visit (INDEPENDENT_AMBULATORY_CARE_PROVIDER_SITE_OTHER): Payer: PPO

## 2021-02-15 DIAGNOSIS — E291 Testicular hypofunction: Secondary | ICD-10-CM

## 2021-02-15 MED ORDER — TESTOSTERONE CYPIONATE 200 MG/ML IM SOLN
60.0000 mg | Freq: Once | INTRAMUSCULAR | Status: AC
  Administered 2021-02-15: 60 mg via INTRAMUSCULAR

## 2021-02-15 NOTE — Progress Notes (Signed)
Testosterone IM Injection  Due to Hypogonadism patient is present today for a Testosterone Injection.  Medication: Testosterone Cypionate Dose: 60mg / 0.70mL  Location: right upper outer buttocks Lot: [id number] Exp:08/2022  Patient tolerated well, no complications were noted.  Performed by: Gordy Clement, CMA   Follow up: RTC in 2 weeks for injection

## 2021-02-20 DIAGNOSIS — M545 Low back pain, unspecified: Secondary | ICD-10-CM | POA: Diagnosis not present

## 2021-03-01 ENCOUNTER — Other Ambulatory Visit: Payer: Self-pay

## 2021-03-01 ENCOUNTER — Telehealth: Payer: Self-pay

## 2021-03-01 ENCOUNTER — Encounter: Payer: Self-pay | Admitting: Family Medicine

## 2021-03-01 ENCOUNTER — Ambulatory Visit (INDEPENDENT_AMBULATORY_CARE_PROVIDER_SITE_OTHER): Payer: PPO

## 2021-03-01 DIAGNOSIS — M47816 Spondylosis without myelopathy or radiculopathy, lumbar region: Secondary | ICD-10-CM

## 2021-03-01 DIAGNOSIS — M159 Polyosteoarthritis, unspecified: Secondary | ICD-10-CM

## 2021-03-01 DIAGNOSIS — M545 Low back pain, unspecified: Secondary | ICD-10-CM | POA: Diagnosis not present

## 2021-03-01 DIAGNOSIS — E291 Testicular hypofunction: Secondary | ICD-10-CM

## 2021-03-01 MED ORDER — TESTOSTERONE CYPIONATE 200 MG/ML IM SOLN
60.0000 mg | Freq: Once | INTRAMUSCULAR | Status: AC
  Administered 2021-03-01: 60 mg via INTRAMUSCULAR

## 2021-03-01 MED ORDER — MELOXICAM 15 MG PO TABS: 15.0000 mg | ORAL_TABLET | ORAL | 1 refills | Status: DC | PRN

## 2021-03-01 NOTE — Progress Notes (Signed)
Testosterone IM Injection  Due to Hypogonadism patient is present today for a Testosterone Injection.  Medication: Testosterone Cypionate Dose: 0.54mL/60mg  Location: left upper outer buttocks Lot: [id number] Exp:08/2022  Patient tolerated well, no complications were noted  Performed by: Bradly Bienenstock CMA  Follow up: Pt used his last single dose vial today, advised pt I would put in refill request so he may get medication before next injection. RTC in 2 weeks for next injection.

## 2021-03-01 NOTE — Telephone Encounter (Signed)
Pt will need testosterone refill before next injection appt on 3/6. Pt would like it sent to [facility].

## 2021-03-02 MED ORDER — TESTOSTERONE CYPIONATE 200 MG/ML IM SOLN: 60.0000 mg | INTRAMUSCULAR | 0 refills | Status: DC

## 2021-03-02 NOTE — Addendum Note (Signed)
Addended by: Abbie Sons on: 03/02/2021 02:51 PM   Modules accepted: Orders

## 2021-03-10 MED ORDER — CELECOXIB 100 MG PO CAPS: 100.0000 mg | ORAL_CAPSULE | Freq: Two times a day (BID) | ORAL | 2 refills | Status: DC

## 2021-03-10 NOTE — Addendum Note (Signed)
Addended by: [name] on: 03/10/2021 05:40 PM ? ? Modules accepted: Orders ? ?

## 2021-03-11 ENCOUNTER — Telehealth: Payer: Self-pay | Admitting: Nurse Practitioner

## 2021-03-11 NOTE — Telephone Encounter (Signed)
Palliative NP requested that I contact patient/wife to schedule a Palliative f/u visit.  Spoke with wife and have scheduled a Telehealth f/u visit for 03/12/21 @ 11 AM. ?

## 2021-03-12 ENCOUNTER — Encounter: Payer: Self-pay | Admitting: Nurse Practitioner

## 2021-03-12 ENCOUNTER — Other Ambulatory Visit: Payer: PPO | Admitting: Nurse Practitioner

## 2021-03-12 ENCOUNTER — Other Ambulatory Visit: Payer: Self-pay

## 2021-03-12 DIAGNOSIS — M158 Other polyosteoarthritis: Secondary | ICD-10-CM

## 2021-03-12 DIAGNOSIS — E440 Moderate protein-calorie malnutrition: Secondary | ICD-10-CM

## 2021-03-12 DIAGNOSIS — M549 Dorsalgia, unspecified: Secondary | ICD-10-CM | POA: Diagnosis not present

## 2021-03-12 DIAGNOSIS — Z515 Encounter for palliative care: Secondary | ICD-10-CM | POA: Diagnosis not present

## 2021-03-12 DIAGNOSIS — G8929 Other chronic pain: Secondary | ICD-10-CM | POA: Diagnosis not present

## 2021-03-12 NOTE — Progress Notes (Signed)
? ? ?Manufacturing engineer ?Community Palliative Care Consult Note ?Telephone: [phone]  ?Fax: [phone]  ? ? ?Date of encounter: 03/12/21 ?2:33 PM ?PATIENT NAME: [name] ?[name] [address]   ?[phone] (home)  ?DOB: [birth year] ?MRN: [id number] ?PRIMARY CARE PROVIDER:    ?[name], DO,  ?[address] ?[place] [address] ?[phone] ? ?RESPONSIBLE PARTY:    ?Contact Information   ? ? Name Relation Home Work Mobile  ? Ahmar[place], [place] Spouse [phone]  [phone]  ? ?  ? ?Due to the COVID-19 crisis, this visit was done via telemedicine from my office and it was initiated and consent by this patient and or family. ? ?I connected with Mrs Duford with [name] on 03/12/21 by a telephone as video not available enabled telemedicine application and verified that I am speaking with the correct person using two identifiers. ?  ?I discussed the limitations of evaluation and management by telemedicine. The patient expressed understanding and agreed to proceed. Palliative Care was asked to follow this patient by consultation request of  [name] * to address advance care planning and complex medical decision making. This is a follow up visit.                                  ?ASSESSMENT AND PLAN / RECOMMENDATIONS:  ?Symptom Management/Plan: ?1. Advance Care Planning; Wishes are to be DNR ?  ?2. Memory impairment secondary to dementia, continue to monitor. FAST 6A ?  ?3. Protein calorie malnutrition likely secondary to dementia, dysphagia. Will continue to monitor weights. weights seem to have leveled off discussed nutrition. Mr and Mrs Rape continue to eat out multiple meals daily. Current weight 138.5 lbs; pending appointment with Dr Allen Norris GI Monday to further discuss swallowing.  ? ?4. Chronic back pain secondary to OA, DDD; discussed at length with Mrs Sprigg will continue celebrex for now, revisit in 1 week to discuss further diagnostic  testing possible MIT. May consider low dose opiate though in the past did not notice a difference with baclofen, nsaids, tylenol, tramadol. Discussed importance of determining OA vs muscular and possible disc, fx which may not appear on plain films. Ms. Odea endorses they have an appointment with Dr Allen Norris GI about swallowing Monday and wanted to wait until after that appointment then revisit back pain. Updated Dr Parks Ranger.  ?  ?5. Palliative care encounter; Palliative care encounter; Palliative medicine team will continue to support patient, patient's family, and medical team. Visit consisted of counseling and education dealing with the complex and emotionally intense issues of symptom management and palliative care in the setting of serious and potentially life-threatening illness ? ?Follow up Palliative Care Visit: Palliative care will continue to follow for complex medical decision making, advance care planning, and clarification of goals. Return 1 weeks or prn. ? ?I spent 32 minutes providing this consultation. More than 50% of the time in this consultation was spent in counseling and care coordination. ?PPS: 50% ? ?Chief Complaint: Follow up palliative consult for complex medical decision making ? ?HISTORY OF PRESENT ILLNESS:  [name] is a 88 y.o. year old male  with multiple medical problems including CAD s/p CABG, afib, HTN, PCM, chronic back pain, low testosterone (on replacement). I called Mrs Bon with Mr Chizek for telemedicine telephone as video not available for f/u pc visit. We talked about how Mr Chaput has  been feeling, ros, symptoms, appetite, weight, progression of OA/DDD, medications previously tried, currently on celebrex with some relief. Mrs Lipke endorses last night was the first night Mr Kray has slept. Medical goals, chronic back pain with 3 visits to emergortho. We talked about medications with options, including further diagnostic testing. We talked about revisiting in 1 week following  GI appointment with Dr Allen Norris. Emotional support provided.  ? ?History obtained from review of EMR, discussion with Mrs Coronado with Mr. Parish.  ?I reviewed available labs, medications, imaging, studies and related documents from the EMR.  Records reviewed and summarized above.  ? ?ROS ?Reviewed 10 point system with Ms. Brenn all negative except HPI ? ?Physical Exam: ?deferred ?Thank you for the opportunity to participate in the care of Mr. Diodato.  The palliative care team will continue to follow. Please call our office at [phone] if we can be of additional assistance.  ? ?Mayo Owczarzak Z Deasha Clendenin, NP  ?   ?

## 2021-03-15 ENCOUNTER — Ambulatory Visit (INDEPENDENT_AMBULATORY_CARE_PROVIDER_SITE_OTHER): Payer: PPO

## 2021-03-15 ENCOUNTER — Other Ambulatory Visit: Payer: Self-pay

## 2021-03-15 DIAGNOSIS — E291 Testicular hypofunction: Secondary | ICD-10-CM

## 2021-03-15 MED ORDER — TESTOSTERONE CYPIONATE 200 MG/ML IM SOLN
60.0000 mg | Freq: Once | INTRAMUSCULAR | Status: AC
  Administered 2021-03-15: 60 mg via INTRAMUSCULAR

## 2021-03-15 NOTE — Progress Notes (Signed)
IM Injection ? ?Patient is present today for an IM Injection for treatment of testosterone deficiency. ?Drug: Testosterone Cypionate ?Dose:0.69m/'60mg'$  ?Location:Right upper outer buttocks ?Lot: [id number]?Exp:09/2022 ?Patient tolerated well, no complications were noted ? ?Performed by: CBradly BienenstockCMA ? ?Additional notes/ Follow up: RTC in 2 weeks for next injection.   ?

## 2021-03-16 ENCOUNTER — Telehealth: Payer: Self-pay | Admitting: Nurse Practitioner

## 2021-03-16 ENCOUNTER — Ambulatory Visit (INDEPENDENT_AMBULATORY_CARE_PROVIDER_SITE_OTHER): Payer: PPO | Admitting: Gastroenterology

## 2021-03-16 ENCOUNTER — Encounter: Payer: Self-pay | Admitting: Gastroenterology

## 2021-03-16 VITALS — BP 132/79 | HR 66 | Temp 97.5°F | Wt 137.0 lb

## 2021-03-16 DIAGNOSIS — R1310 Dysphagia, unspecified: Secondary | ICD-10-CM

## 2021-03-16 NOTE — Progress Notes (Signed)
? ? ?Primary Care Physician: [name], DO ? ?Primary Gastroenterologist:  Dr. [name] ? ?Chief Complaint  ?Patient presents with  ? Follow-up  ?  Dysphagia has worsened... gargling noises worse at night-- warm salt water gargles does help...  ? ? ?HPI: [name] is a 88 y.o. male here with a history of dysphagia.  The patient underwent a modified barium swallow that did not show any esophageal problems but mostly swallowing issues. The patient had a EGD with dilation of the esophagus up to 18 mm but reports that he did not feel much better afterwards.  He is now having trouble with swallowing again. ? ?Past Medical History:  ?Diagnosis Date  ? Arthritis   ? Coronary artery disease   ? a. 1993 s/p PCI/BMS LAD;  b. 2003 PCI RCA;  c. 09/2006 ISR LAD->PCI, PCI RCA;  d. 02/2007 MV: no ischemia;  d. 01/2012 MV: EF 74%, no ischemia.  ? History of prostate cancer   ? Hyperlipidemia   ? Hyperthyroidism   ? Low testosterone   ? PAF (paroxysmal atrial fibrillation) ([facility])   ? a. previously on amio ->stopped in 2013;  b. recurrent PAF 01/2012->Pradaxa and amio initiated, subsequently came off of pradaxa 2/2 cost;  c. 02/2012 Echo: EF 55-60%, Gr 1 DD.  ? Prostate cancer ([name])   ? S/P CABG x 4   ? ? ?Current Outpatient Medications  ?Medication Sig Dispense Refill  ? Acetaminophen (TYLENOL ARTHRITIS PAIN PO) Take by mouth as needed.    ? amLODipine (NORVASC) 5 MG tablet Take 1 tablet (5 mg total) by mouth daily. 90 tablet 3  ? aspirin EC 81 MG tablet Take 81 mg by mouth every evening.    ? bisacodyl (DULCOLAX) 10 MG suppository Place 10 mg rectally daily as needed for moderate constipation.    ? celecoxib (CELEBREX) 100 MG capsule Take 1 capsule (100 mg total) by mouth 2 (two) times daily. 60 capsule 2  ? Cholecalciferol (VITAMIN D3) 1.25 MG (50000 UT) CAPS Take by mouth daily.    ? dorzolamide-timolol (COSOPT) 22.3-6.8 MG/ML ophthalmic solution 1 drop 2 (two) times daily.    ? ezetimibe (ZETIA) 10 MG tablet  Take 1 tablet (10 mg total) by mouth daily. 90 tablet 3  ? fluticasone (FLONASE) 50 MCG/ACT nasal spray SPRAY TWO SPRAYS IN EACH NOSTRIL ONCE DAILY - USE FOR 4-6 WEEKS THEN STOP AND USE SEASONALLY OR AS NEEDED 16 mL 1  ? hydrocortisone 2.5 % cream APPLY A THIN LAYER TO FACE TWICE A DAY AS NEEDED FOR 1 TO 2 WEEKS 30 g 1  ? latanoprost (XALATAN) 0.005 % ophthalmic solution 0.005 drops daily.    ? metroNIDAZOLE (METROGEL) 0.75 % gel Apply 1-2 times daily to face. 45 g 2  ? mirtazapine (REMERON) 15 MG tablet Take 1 tablet (15 mg total) by mouth at bedtime. 90 tablet 3  ? polyethylene glycol powder (GLYCOLAX/MIRALAX) 17 GM/SCOOP powder Take 17-34 g by mouth daily. For constipation 765 g 5  ? simvastatin (ZOCOR) 20 MG tablet Take 1 tablet (20 mg total) by mouth daily in the afternoon. 90 tablet 3  ? testosterone cypionate (DEPOTESTOSTERONE CYPIONATE) 200 MG/ML injection Inject 0.3 mLs (60 mg total) into the muscle every 14 (fourteen) days. 4 mL 0  ? timolol (TIMOPTIC) 0.5 % ophthalmic solution Place 1 drop into both eyes daily.     ? ?No current facility-administered medications for this visit.  ? ? ?Allergies as of 03/16/2021 - Review Complete  03/16/2021  ?Allergen Reaction Noted  ? Ambien [zolpidem tartrate] Other (See Comments) 01/25/2012  ? Pravastatin sodium  12/02/2013  ? Rosuvastatin calcium  05/19/2014  ? Zolpidem tartrate  10/05/2010  ? ? ?ROS: ? ?General: Negative for anorexia, weight loss, fever, chills, fatigue, weakness. ?ENT: Negative for hoarseness, difficulty swallowing , nasal congestion. ?CV: Negative for chest pain, angina, palpitations, dyspnea on exertion, peripheral edema.  ?Respiratory: Negative for dyspnea at rest, dyspnea on exertion, cough, sputum, wheezing.  ?GI: See history of present illness. ?GU:  Negative for dysuria, hematuria, urinary incontinence, urinary frequency, nocturnal urination.  ?Endo: Negative for unusual weight change.  ?  ?Physical Examination: ? ? BP 132/79   Pulse 66    Temp (!) 97.5 ?F (36.4 ?C) (Oral)   Wt 137 lb (62.1 kg)   BMI 17.59 kg/m?  ? ?General: Well-nourished, well-developed in no acute distress.  ?Eyes: No icterus. Conjunctivae pink. ?Lungs: Clear to auscultation bilaterally. Non-labored. ?Heart: Regular rate and rhythm, no murmurs rubs or gallops.  ?Abdomen: Bowel sounds are normal, nontender, nondistended, no hepatosplenomegaly or masses, no abdominal bruits or hernia , no rebound or guarding.   ?Extremities: No lower extremity edema. No clubbing or deformities. ?Neuro: Alert and oriented x 3.  Grossly intact. ?Skin: Warm and dry, no jaundice.   ?Psych: Alert and cooperative, normal mood and affect. ? ?Labs:  ?  ?Imaging Studies: ?No results found. ? ?Assessment and Plan:  ? ?[name] is a 88 y.o. y/o male Who has a history of dysphagia with a abnormal modified Barrett's follow-up.  The patient will be set up for a upper GI series with the intent to look for a stricture or narrowing.  If the patient does not have a stricture then he will not be set up for an EGD but if he does have a stricture or narrowing than a EGD may be needed.  If it continues to be a motor function issues then he has been told to continue doing what he was taught by speech pathologist to better help him swallow his food.  The patient has been pain the plan and agrees with it. ? ? ? ? ?[name], MD. Mar[facility] Regal ? ? ? Note: This dictation was prepared with Dragon dictation along with smaller phrase technology. Any transcriptional errors that result from this process are unintentional.  ?

## 2021-03-16 NOTE — Telephone Encounter (Signed)
Palliative telemedicine visit f/u scheduled for today at 2pm; will call to reschedule, noted scheduled visit with Dr Allen Norris today at 2:30pm. Will call to reschedule PC visit ?

## 2021-03-27 ENCOUNTER — Encounter: Payer: Self-pay | Admitting: Family Medicine

## 2021-03-27 DIAGNOSIS — F419 Anxiety disorder, unspecified: Secondary | ICD-10-CM

## 2021-03-27 DIAGNOSIS — F5104 Psychophysiologic insomnia: Secondary | ICD-10-CM

## 2021-03-29 ENCOUNTER — Ambulatory Visit (INDEPENDENT_AMBULATORY_CARE_PROVIDER_SITE_OTHER): Payer: PPO

## 2021-03-29 ENCOUNTER — Other Ambulatory Visit: Payer: Self-pay

## 2021-03-29 DIAGNOSIS — E291 Testicular hypofunction: Secondary | ICD-10-CM

## 2021-03-29 MED ORDER — LORAZEPAM 0.5 MG PO TABS: 0.5000 mg | ORAL_TABLET | Freq: Two times a day (BID) | ORAL | 1 refills | Status: DC | PRN

## 2021-03-29 MED ORDER — TESTOSTERONE CYPIONATE 200 MG/ML IM SOLN
60.0000 mg | Freq: Once | INTRAMUSCULAR | Status: AC
  Administered 2021-03-29: 60 mg via INTRAMUSCULAR

## 2021-03-29 NOTE — Progress Notes (Signed)
IM Injection ? ?Patient is present today for an IM Injection for treatment of hypogonadism. ?Drug: Testosterone Cypionate ?Dose:0.45m/'60mg'$  ?Location:Left upper outer buttocks ?Lot: [id number] ?Exp:09/2022 ?Patient tolerated well, no complications were noted ? ?Performed by: CBradly BienenstockCMA ? ?Additional notes/ Follow up: RTC in 2 weeks for next injection. ?

## 2021-03-30 ENCOUNTER — Ambulatory Visit: Payer: PPO

## 2021-04-09 ENCOUNTER — Encounter: Payer: Self-pay | Admitting: Nurse Practitioner

## 2021-04-09 ENCOUNTER — Other Ambulatory Visit: Payer: PPO | Admitting: Nurse Practitioner

## 2021-04-09 DIAGNOSIS — G4701 Insomnia due to medical condition: Secondary | ICD-10-CM | POA: Diagnosis not present

## 2021-04-09 DIAGNOSIS — E440 Moderate protein-calorie malnutrition: Secondary | ICD-10-CM

## 2021-04-09 DIAGNOSIS — G8929 Other chronic pain: Secondary | ICD-10-CM

## 2021-04-09 DIAGNOSIS — Z515 Encounter for palliative care: Secondary | ICD-10-CM

## 2021-04-09 DIAGNOSIS — M549 Dorsalgia, unspecified: Secondary | ICD-10-CM | POA: Diagnosis not present

## 2021-04-09 NOTE — Progress Notes (Signed)
? ? ?Manufacturing engineer ?Community Palliative Care Consult Note ?Telephone: [phone]  ?Fax: [phone]  ? ? ?Date of encounter: 04/09/21 ?5:12 PM ?PATIENT NAME: [name] ?[place] ?[name] [address]   ?[phone] (home)  ?DOB: 01-Mar-1932 ?MRN: [id number] ?PRIMARY CARE PROVIDER:    ?Darrell Hauser, DO,  ?[address] ?[place] [address] ?[phone] ? ?RESPONSIBLE PARTY:    ?Contact Information   ? ? Name Relation Home Work Mobile  ? [name], [name] Spouse [phone]  [phone]  ? ?  ? ?I met face to face with patient and family in home. Palliative Care was asked to follow this patient by consultation request of  [name] * to address advance care planning and complex medical decision making. This is a follow up visit.                                  ?ASSESSMENT AND PLAN / RECOMMENDATIONS:  ?Symptom Management/Plan: ?1. Advance Care Planning; Wishes are to be DNR ?  ?2. Memory impairment secondary to dementia, continue to monitor. FAST 6A ?  ?3. Protein calorie malnutrition likely secondary to dementia, dysphagia. Will continue to monitor weights. weights seem to have leveled off discussed nutrition. [name] continue to eat out multiple meals daily. Current weight 144 lbs; with increase of 5.5 lbs since last visit. Praised [name].  ? ?03/12/2021 weight 138.5 bls ?04/09/2021 weight 144 lbs ?5.5 lbs weight gain in 4 weeks ? ?4. Chronic back pain secondary to OA, DDD; discussed at length with [name] will continue celebrex continues to work with pain relieved. Would recommend to continue for now, monitoring for side effects, updated risk ? ?5. Insomnia; discussed sleep patterns, getting up in the middle of the night frequently. Prescribed Ativan qhs with some improvement, though last night Darrell. [name] was up and down all night with a flashlight looking in rooms. We talked about sleep hygiene. Tried melatonin and ambien in the past without improvement.  Continues to take remeron qhs which has helped appetite. Discussed with [name] with [name] can try trazodone low dose. Education done with [name] about Trazodone to take 1/2 tablet to start, monitor for side effects and notify provider if develops. [name] in agreement. We talked about [name]. Support provided. Will f/u in 1 week ? ?Rx: Trazodone 22m qhs; #30; No RF; take 1/2 tablet (12.569m qhs ? ?6. Palliative care encounter; Palliative care encounter; Palliative medicine team will continue to support patient, patient's family, and medical team. Visit consisted of counseling and education dealing with the complex and emotionally intense issues of symptom management and palliative care in the setting of serious and potentially life-threatening illness ?  ?Follow up Palliative Care Visit: Palliative care will continue to follow for complex medical decision making, advance care planning, and clarification of goals. Return 1 weeks or prn. ? ?I spent 45 minutes providing this consultation. More than 50% of the time in this consultation was spent in counseling and care coordination. ?PPS: 50% ? ?Chief Complaint: Follow up palliative consult for complex medical decision making ? ?HISTORY OF PRESENT ILLNESS:  [name] a [age].o. year old male  with multiple medical problems including CAD s/p CABG, afib, HTN, PCM, chronic back pain, low testosterone (on replacement). I called Darrell WaCastigliao confirm in person PC visit and  covid screening negative. I met with [name] in their home, living room. We talked about how [name] has been feeling, ros, symptoms, appetite, weight gain, progression of OA/DDD with celebrex relief of pain. We talked about recent GI visit, Ms. [name] decided not to pursue further testing such as barium swallow at this time. [name] endorses she monitors while [name] eats his meals. [name] does not always finish his plate though he  is eating more noted in weight gain. We talked about sleep patterns, [name], see above. Medical goals, ros, symptoms reviewed. No recent falls, wounds, infections, hospitalizations. We talked about trying trazodone and included [name] by message to see if want to proceed, all in agreement. Rx escribed to pharmacy. We talked about f/u visit in 1 week. Emotional support provided.  Questions answered ? ?History obtained from review of EMR, discussion with Darrell and Darrell. [name].  ?I reviewed available labs, [name], imaging, studies and related documents from the EMR.  Records reviewed and summarized above.  ? ?ROS ?10 point system reviewed all negative except HPI ? ?Physical Exam: ?Constitutional: NAD ?General: frail appearing, thin pleasant male ?EYES: anicteric sclera, lids intact ?ENMT: intact hearing, oral mucous membranes moist, dentition intact ?MSK: ambulatory ?Skin: warm and dry ?Neuro:  + generalized weakness,  + cognitive impairment ?Psych: non-anxious affect, A and O x 3 ?Thank you for the opportunity to participate in the care of [name].  The palliative care team will continue to follow. Please call our office at [phone] if we can be of additional assistance.  ? ?[name] Darrell Shamia Uppal, NP  ? ?COVID-19 PATIENT SCREENING TOOL ?Asked and negative response unless otherwise noted:  ? ?Have you had symptoms of covid, tested positive or been in contact with someone with symptoms/positive test in the past 5-10 days? no  ?

## 2021-04-12 ENCOUNTER — Ambulatory Visit (INDEPENDENT_AMBULATORY_CARE_PROVIDER_SITE_OTHER): Payer: PPO | Admitting: Physician Assistant

## 2021-04-12 DIAGNOSIS — E291 Testicular hypofunction: Secondary | ICD-10-CM

## 2021-04-12 MED ORDER — TESTOSTERONE CYPIONATE 200 MG/ML IM SOLN
60.0000 mg | Freq: Once | INTRAMUSCULAR | Status: AC
  Administered 2021-04-12: 60 mg via INTRAMUSCULAR

## 2021-04-12 NOTE — Progress Notes (Signed)
Testosterone IM Injection ? ?Due to Hypogonadism patient is present today for a Testosterone Injection. ? ?Medication: Testosterone Cypionate ?Dose: 0.53m/'60mg'$  ?Location: right upper outer buttocks ?Lot: [id number]?Exp:09/2022 ? ?Patient tolerated well, no complications were noted ? ?Performed by: CBradly BienenstockCMA ? ?Follow up: RTC in 2 weeks for next injection.  ?

## 2021-04-14 ENCOUNTER — Encounter: Payer: Self-pay | Admitting: Nurse Practitioner

## 2021-04-14 ENCOUNTER — Other Ambulatory Visit: Payer: PPO | Admitting: Nurse Practitioner

## 2021-04-14 DIAGNOSIS — Z515 Encounter for palliative care: Secondary | ICD-10-CM

## 2021-04-14 DIAGNOSIS — G4701 Insomnia due to medical condition: Secondary | ICD-10-CM | POA: Diagnosis not present

## 2021-04-14 NOTE — Progress Notes (Addendum)
? ? ?Manufacturing engineer ?Community Palliative Care Consult Note ?Telephone: [phone]  ?Fax: [phone]  ? ? ?Date of encounter: 04/14/21 ?5:38 PM ?PATIENT NAME: [name] ?[name] ?[name]   ?[phone] (home)  ?DOB: 11-19-1932 ?MRN: [id number] ?PRIMARY CARE PROVIDER:    ?Darrell Hauser, DO,  ?[address] ?[place] [address] ?[phone] ?RESPONSIBLE PARTY:    ?Contact Information   ? ? Name Relation Home Work Mobile  ? Darrell, [name] Spouse [phone]  [phone]  ? ?  ? ?Due to Darrell COVID-19 crisis, this visit was done via telemedicine from my office and it was initiated and consent by this patient and or family. ? ?I connected with [name] with [name] OR PROXY on 04/14/21 by a telephone as video not available enabled telemedicine application and verified that I am speaking with Darrell correct person using two identifiers. ?  ?I discussed Darrell limitations of evaluation and management by telemedicine. Darrell patient expressed understanding and agreed to proceed. Palliative Care was asked to follow this patient by consultation request of  [name] * to address advance care planning and complex medical decision making. This is a follow up visit.                                  ?ASSESSMENT AND PLAN / RECOMMENDATIONS:  ?Symptom Management/Plan: ?1. Advance Care Planning; Wishes are to be DNR ?  ?2. Memory impairment secondary to dementia, continue to monitor. FAST 6A ?  ?3. Insomnia; discussed sleep patterns, has continued to get up in Darrell middle of Darrell night, [name] endorses Darrell trazodone has not improved Darrell sleep. He stopped it and continued to take Ativan qhs. [name] endorses she is his caregiver and it is becoming very difficult trying to manage [name] without her getting sleep. [name] endorses it seems like sundowners but he does not have dx of specific dementia, though did not see need for further workups. Will revisit with Darrell  Parks [name] to see other suggestions possible zyprexa but with Darrell effects seroquel had on [name] it is likely he will not take it. Emotional support provided. Will have PC RN schedule 2 to 4 week f/u as [name] allows for support. Recommended can try OTC Magnesium for sleep as [name] shared it appears he has some RLS and this would help with RLS and sleep. Darrell Citro shared she would think about it.  ? ?4. Palliative care encounter; Palliative care encounter; Palliative medicine team will continue to support patient, patient's family, and medical team. Visit consisted of counseling and education dealing with Darrell complex and emotionally intense issues of symptom management and palliative care in Darrell setting of serious and potentially life-threatening illness ? ?Follow up Palliative Care Visit: Palliative care will continue to follow for complex medical decision making, advance care planning, and clarification of goals. Return 4 weeks or prn. ? ?I spent 22 minutes providing this consultation. More than 50% of Darrell time in this consultation was spent in counseling and care coordination. ? ?PPS: 50% ? ?Chief Complaint: Follow up palliative consult for complex medical decision making ? ?HISTORY OF PRESENT ILLNESS:  [name] is a 88 y.o. year old male  with multiple medical problems including CAD s/p CABG, afib, HTN, PCM, chronic back pain, low testosterone (on replacement). I called [name] for f/u visit. [name] endorses Darrell  trazodone did not help, he returned to ativan and no reason to come to do in person, preferred telemedicine, telephonic. See above.  ? ?History obtained from review of EMR, discussion [name] with Darrell. [name].  ?I reviewed available labs, medications, imaging, studies and related documents from Darrell EMR.  Records reviewed and summarized above.  ? ?ROS ?10 point system reviewed all negative except hpi ? ?Physical Exam: ?deferred ? ?Thank you for Darrell opportunity to participate in Darrell care of  Darrell. [name].  Darrell palliative care team will continue to follow. Please call our office at [phone] if we can be of additional assistance.  ? ?Adryan Shin Z Leory Allinson, NP  ? ?

## 2021-04-23 DIAGNOSIS — H401121 Primary open-angle glaucoma, left eye, mild stage: Secondary | ICD-10-CM | POA: Diagnosis not present

## 2021-04-26 ENCOUNTER — Telehealth: Payer: Self-pay

## 2021-04-26 ENCOUNTER — Other Ambulatory Visit: Payer: Self-pay

## 2021-04-26 ENCOUNTER — Ambulatory Visit (INDEPENDENT_AMBULATORY_CARE_PROVIDER_SITE_OTHER): Payer: PPO | Admitting: Physician Assistant

## 2021-04-26 DIAGNOSIS — E291 Testicular hypofunction: Secondary | ICD-10-CM | POA: Diagnosis not present

## 2021-04-26 MED ORDER — TESTOSTERONE CYPIONATE 200 MG/ML IM SOLN
60.0000 mg | Freq: Once | INTRAMUSCULAR | Status: AC
  Administered 2021-04-26: 60 mg via INTRAMUSCULAR

## 2021-04-26 MED ORDER — TESTOSTERONE CYPIONATE 200 MG/ML IM SOLN: 60.0000 mg | INTRAMUSCULAR | 0 refills | Status: DC

## 2021-04-26 NOTE — Telephone Encounter (Signed)
Pt will need refill on Testosterone before next injection appt on May 1st. Pt uses [name]. ?

## 2021-04-26 NOTE — Progress Notes (Signed)
Testosterone IM Injection ? ?Due to Hypogonadism patient is present today for a Testosterone Injection. ? ?Medication: Testosterone Cypionate ?Dose: 0.94m/'60mg'$  ?Location: right upper outer buttocks ?Lot: [id number]?Exp:09/2022 ? ?Patient tolerated well, no complications were noted ? ?Performed by: CBradly BienenstockCMA ? ?Follow up: RTC in 2 weeks for next injection. Pt notes he will need a testosterone refill before next injection, refill request sent.   ?

## 2021-04-26 NOTE — Telephone Encounter (Signed)
210 pm.  Request received from [place], NP to schedule a follow up visit in 2-4 weeks.  Phone call made to [facility] and message left requesting a call back.  ?

## 2021-04-28 ENCOUNTER — Telehealth: Payer: Self-pay

## 2021-04-28 NOTE — Telephone Encounter (Signed)
Late Entry for 04/27/21 @ 437 pm.  Incoming call from wife Neoma Laming.  She is asking about trying seroquel for patient has is "pacing the floors at night". If nothing can be started, she did not feel a visit was needed.  Advised that I would contact Christin Gulser, NP of this request.  ? ?Per NP, patient needs to follow up with PCP and have a neurology referral before starting any medications.  Wife updated on above.  ?

## 2021-04-30 MED ORDER — TESTOSTERONE CYPIONATE 200 MG/ML IM SOLN: 60.0000 mg | INTRAMUSCULAR | 0 refills | Status: DC

## 2021-04-30 NOTE — Addendum Note (Signed)
Addended by: Abbie Sons on: 04/30/2021 02:47 PM ? ? Modules accepted: Orders ? ?

## 2021-05-05 ENCOUNTER — Other Ambulatory Visit: Payer: Self-pay | Admitting: Urology

## 2021-05-10 ENCOUNTER — Ambulatory Visit (INDEPENDENT_AMBULATORY_CARE_PROVIDER_SITE_OTHER): Payer: PPO | Admitting: Physician Assistant

## 2021-05-10 DIAGNOSIS — E291 Testicular hypofunction: Secondary | ICD-10-CM

## 2021-05-10 MED ORDER — TESTOSTERONE CYPIONATE 200 MG/ML IM SOLN
60.0000 mg | Freq: Once | INTRAMUSCULAR | Status: AC
  Administered 2021-05-10: 60 mg via INTRAMUSCULAR

## 2021-05-10 NOTE — Progress Notes (Signed)
Testosterone IM Injection ? ?Due to Hypogonadism patient is present today for a Testosterone Injection. ? ?Medication: Testosterone Cypionate ?Dose: '60mg'$ /0.38m ?Location: right upper outer buttocks ?Lot: [id number]?Exp:10/2023 ? ?Patient tolerated well, no complications were noted. ? ?Performed by: OGordy Clement CBerry Hill ? ?Follow up: RTC in 2wks for testosterone injection  ? ?

## 2021-05-20 DIAGNOSIS — H401123 Primary open-angle glaucoma, left eye, severe stage: Secondary | ICD-10-CM | POA: Diagnosis not present

## 2021-05-20 DIAGNOSIS — H401111 Primary open-angle glaucoma, right eye, mild stage: Secondary | ICD-10-CM | POA: Diagnosis not present

## 2021-05-24 ENCOUNTER — Ambulatory Visit: Payer: PPO | Admitting: Physician Assistant

## 2021-05-24 DIAGNOSIS — E291 Testicular hypofunction: Secondary | ICD-10-CM

## 2021-05-24 DIAGNOSIS — Z87440 Personal history of urinary (tract) infections: Secondary | ICD-10-CM

## 2021-05-24 MED ORDER — TESTOSTERONE CYPIONATE 200 MG/ML IM SOLN
60.0000 mg | Freq: Once | INTRAMUSCULAR | Status: AC
  Administered 2021-05-24: 60 mg via INTRAMUSCULAR

## 2021-05-24 NOTE — Progress Notes (Signed)
Testosterone IM Injection ? ?Due to Hypogonadism patient is present today for a Testosterone Injection. ? ?Medication: Testosterone Cypionate ?Dose: '60mg'$ /0.104m ?Location: left upper outer buttocks ?Lot: [id number]?Exp:10/2023 ? ?Patient tolerated well, no complications were noted. ? ?Performed by: OGordy Clement CPump Back? ?Follow up: RTC as scheduled for labs. Pt requested UA in addition to upcoming blood work. UA ordered.  ? ?

## 2021-05-27 ENCOUNTER — Other Ambulatory Visit: Payer: Self-pay

## 2021-05-27 DIAGNOSIS — E291 Testicular hypofunction: Secondary | ICD-10-CM

## 2021-05-27 DIAGNOSIS — Z8546 Personal history of malignant neoplasm of prostate: Secondary | ICD-10-CM

## 2021-05-28 ENCOUNTER — Other Ambulatory Visit: Payer: PPO

## 2021-05-28 DIAGNOSIS — Z8546 Personal history of malignant neoplasm of prostate: Secondary | ICD-10-CM | POA: Diagnosis not present

## 2021-05-28 DIAGNOSIS — E291 Testicular hypofunction: Secondary | ICD-10-CM

## 2021-05-29 LAB — PSA: Prostate Specific Ag, Serum: <0.1 ng/mL (ref 0.0–4.0)

## 2021-05-29 LAB — HEMATOCRIT: Hematocrit: 39.3 % (ref 37.5–51.0)

## 2021-05-29 LAB — TESTOSTERONE: Testosterone: 1046 ng/dL — ABNORMAL HIGH (ref 264–916)

## 2021-05-31 ENCOUNTER — Ambulatory Visit: Payer: Self-pay | Admitting: Urology

## 2021-06-02 ENCOUNTER — Encounter: Payer: Self-pay | Admitting: Urology

## 2021-06-02 ENCOUNTER — Ambulatory Visit: Payer: PPO | Admitting: Urology

## 2021-06-02 VITALS — BP 128/78 | HR 60 | Ht 74.0 in | Wt 138.0 lb

## 2021-06-02 DIAGNOSIS — Z8546 Personal history of malignant neoplasm of prostate: Secondary | ICD-10-CM | POA: Diagnosis not present

## 2021-06-02 DIAGNOSIS — E291 Testicular hypofunction: Secondary | ICD-10-CM | POA: Diagnosis not present

## 2021-06-02 NOTE — Progress Notes (Signed)
06/02/2021 1:30 PM   [name] Nov 19, 1932 [id number]  Referring provider: [name], DO [address] [place],  [place] [address]  Chief Complaint  Patient presents with   Hypogonadism    Urologic history:  1.  History prostate cancer low risk -Radical prostatectomy 2005; PSAs remain undetectable   2.  Symptomatic hypogonadism -TRT started 2013   HPI: 88 y.o. male presents for annual follow-up.  His wife is with him today.  TRT started 2013 for symptoms of excessive sweating and flushing with resolution on replacement Remains on 60 mg testosterone cypionate every 2 weeks No bothersome LUTS Denies dysuria, gross hematuria Labs 05/28/2021: Testosterone ng/dL 1046; PSA <0.1; hematocrit 39.3 Labs were drawn 4 days after his injection Continues with symptomatic relief on TRT He is now receiving his injections in the office   PMH: Past Medical History:  Diagnosis Date   Arthritis    Coronary artery disease    a. 1993 s/p PCI/BMS LAD;  b. 2003 PCI RCA;  c. 09/2006 ISR LAD->PCI, PCI RCA;  d. 02/2007 MV: no ischemia;  d. 01/2012 MV: EF 74%, no ischemia.   History of prostate cancer    Hyperlipidemia    Hyperthyroidism    Low testosterone    PAF (paroxysmal atrial fibrillation) ([place])    a. previously on amio ->stopped in 2013;  b. recurrent PAF 01/2012->Pradaxa and amio initiated, subsequently came off of pradaxa 2/2 cost;  c. 02/2012 Echo: EF 55-60%, Gr 1 DD.   Prostate cancer ([place])    S/P CABG x 4     Surgical History: Past Surgical History:  Procedure Laterality Date   APPENDECTOMY     Dr. Bary Castilla   CARDIAC CATHETERIZATION  2008   CLIPPING OF ATRIAL APPENDAGE Left 05/18/2012   Procedure: CLIPPING OF ATRIAL APPENDAGE;  Surgeon: [name], MD;  Location: [place] [facility];  Service: Open Heart Surgery;  Laterality: Left;   COLONOSCOPY  2013   COLONOSCOPY WITH PROPOFOL N/A 07/12/2016   Procedure: COLONOSCOPY WITH PROPOFOL;  Surgeon: [name], MD;  Location: [facility]  ENDOSCOPY;  Service: Endoscopy;  Laterality: N/A;   CORONARY ARTERY BYPASS GRAFT N/A 05/18/2012   Procedure: CORONARY ARTERY BYPASS GRAFTING (CABG);  Surgeon: [name], MD;  Location: [facility];  Service: Open Heart Surgery;  Laterality: N/A;  Times 4 using left internal mammary artery and endoscopically harvested right saphenous vein   ESOPHAGOGASTRODUODENOSCOPY (EGD) WITH PROPOFOL N/A 10/24/2017   Procedure: ESOPHAGOGASTRODUODENOSCOPY (EGD) WITH PROPOFOL;  Surgeon: [name], MD;  Location: [facility] ENDOSCOPY;  Service: Endoscopy;  Laterality: N/A;   HERNIA REPAIR     s/p mesh bilaterally, Dr. Bary Castilla   INTRAOPERATIVE TRANSESOPHAGEAL ECHOCARDIOGRAM N/A 05/18/2012   Procedure: INTRAOPERATIVE TRANSESOPHAGEAL ECHOCARDIOGRAM;  Surgeon: [name], MD;  Location: [facility];  Service: Open Heart Surgery;  Laterality: N/A;   IR ESOPHAGUS DILITATION RETRO FLUORO     LEFT HEART CATHETERIZATION WITH CORONARY ANGIOGRAM Bilateral 05/16/2012   Procedure: LEFT HEART CATHETERIZATION WITH CORONARY ANGIOGRAM;  Surgeon: Burnell Blanks, MD;  Location: [facility] CATH LAB;  Service: Cardiovascular;  Laterality: Bilateral;   MAZE N/A 05/18/2012   Procedure: MAZE;  Surgeon: [name], MD;  Location: [facility];  Service: Open Heart Surgery;  Laterality: N/A;   MOHS SURGERY     PROSTATECTOMY     TOTAL HIP ARTHROPLASTY Right    Dr. [name]    Home Medications:  Allergies as of 06/02/2021       Reactions   Ambien [  zolpidem Tartrate] Other (See Comments)   Super sensitive.    Pravastatin Sodium    REACTION: muscle aches...crestor   Rosuvastatin Calcium    REACTION: muscle aches...crestor   Zolpidem Tartrate         Medication List        Accurate as of Jun 02, 2021  1:30 PM. If you have any questions, ask your nurse or doctor.          STOP taking these medications    aspirin EC 81 MG tablet Stopped by: Abbie Sons, MD   hydrocortisone 2.5 % cream Stopped by: Abbie Sons, MD   LORazepam  0.5 MG tablet Commonly known as: ATIVAN Stopped by: Abbie Sons, MD   metroNIDAZOLE 0.75 % gel Commonly known as: METROGEL Stopped by: Abbie Sons, MD   mirtazapine 15 MG tablet Commonly known as: Remeron Stopped by: Abbie Sons, MD   simvastatin 20 MG tablet Commonly known as: ZOCOR Stopped by: Abbie Sons, MD   timolol 0.5 % ophthalmic solution Commonly known as: TIMOPTIC Stopped by: Abbie Sons, MD   TYLENOL ARTHRITIS PAIN PO Stopped by: Abbie Sons, MD       TAKE these medications    amLODipine 5 MG tablet Commonly known as: NORVASC Take 1 tablet (5 mg total) by mouth daily.   bisacodyl 10 MG suppository Commonly known as: DULCOLAX Place 10 mg rectally daily as needed for moderate constipation.   celecoxib 100 MG capsule Commonly known as: CeleBREX Take 1 capsule (100 mg total) by mouth 2 (two) times daily.   dorzolamide-timolol 22.3-6.8 MG/ML ophthalmic solution Commonly known as: COSOPT 1 drop 2 (two) times daily.   ezetimibe 10 MG tablet Commonly known as: ZETIA Take 1 tablet (10 mg total) by mouth daily.   fluticasone 50 MCG/ACT nasal spray Commonly known as: FLONASE SPRAY TWO SPRAYS IN EACH NOSTRIL ONCE DAILY - USE FOR 4-6 WEEKS THEN STOP AND USE SEASONALLY OR AS NEEDED   latanoprost 0.005 % ophthalmic solution Commonly known as: XALATAN 0.005 drops daily.   polyethylene glycol powder 17 GM/SCOOP powder Commonly known as: GLYCOLAX/MIRALAX Take 17-34 g by mouth daily. For constipation   testosterone cypionate 200 MG/ML injection Commonly known as: DEPOTESTOSTERONE CYPIONATE Inject 0.3 mLs (60 mg total) into the muscle every 14 (fourteen) days.   traZODone 50 MG tablet Commonly known as: DESYREL Take 50 mg by mouth at bedtime.   Vitamin D3 1.25 MG (50000 UT) Caps Take by mouth daily.        Allergies:  Allergies  Allergen Reactions   Ambien [Zolpidem Tartrate] Other (See Comments)    Super sensitive.     Pravastatin Sodium     REACTION: muscle aches...crestor   Rosuvastatin Calcium     REACTION: muscle aches...crestor   Zolpidem Tartrate     Family History: Family History  Problem Relation Age of Onset   Coronary artery disease Mother    Heart disease Mother    Coronary artery disease Sister    Diabetes Sister    Heart disease Sister 28       deceased   Diabetes Father    Heart disease Brother     Social History:  reports that he quit smoking about 32 years ago. His smoking use included cigars. He quit smokeless tobacco use about 29 years ago.  His smokeless tobacco use included chew. He reports current alcohol use. He reports that he does not use drugs.   Physical  Exam: BP 128/78   Pulse 60   Ht '6\' 2"'$  (1.88 m)   Wt 138 lb (62.6 kg)   BMI 17.72 kg/m   Constitutional:  Alert and oriented, No acute distress. HEENT: [place] AT, moist mucus membranes.  Trachea midline, no masses. Cardiovascular: No clubbing, cyanosis, or edema. Respiratory: Normal respiratory effort, no increased work of breathing.   Assessment & Plan:    1.  Hypogonadism Stable 68-monthlab visit PSA, testosterone, hematocrit 1 year office visit with labs  2.  History of prostate cancer PSA remains undetectable   SAbbie Sons MD  [facility] [place] [phone]

## 2021-06-05 ENCOUNTER — Other Ambulatory Visit: Payer: Self-pay | Admitting: Family Medicine

## 2021-06-05 DIAGNOSIS — M159 Polyosteoarthritis, unspecified: Secondary | ICD-10-CM

## 2021-06-05 DIAGNOSIS — M47816 Spondylosis without myelopathy or radiculopathy, lumbar region: Secondary | ICD-10-CM

## 2021-06-08 ENCOUNTER — Ambulatory Visit (INDEPENDENT_AMBULATORY_CARE_PROVIDER_SITE_OTHER): Payer: PPO | Admitting: Physician Assistant

## 2021-06-08 DIAGNOSIS — E291 Testicular hypofunction: Secondary | ICD-10-CM | POA: Diagnosis not present

## 2021-06-08 MED ORDER — TESTOSTERONE CYPIONATE 200 MG/ML IM SOLN
200.0000 mg | Freq: Once | INTRAMUSCULAR | Status: AC
  Administered 2021-06-08: 200 mg via INTRAMUSCULAR

## 2021-06-08 NOTE — Progress Notes (Signed)
  Due to Hypogonadism patient is present today for a Testosterone Injection.   Medication: Testosterone Cypionate Dose: '60mg'$ /0.82m Location: left upper outer buttocks Lot: [id number]:10/2023   Patient tolerated well, no complications were noted.   Performed by: CElberta LeatherwoodCMA   Return on two weeks

## 2021-06-08 NOTE — Telephone Encounter (Signed)
Requested Prescriptions  Pending Prescriptions Disp Refills  . celecoxib (CELEBREX) 100 MG capsule [Pharmacy Med Name: CELECOXIB 100 MG CAPSULE] 60 capsule 2    Sig: TAKE ONE CAPSULE BY MOUTH TWICE A DAY     Analgesics:  COX2 Inhibitors Failed - 06/05/2021  6:51 AM      Failed - Manual Review: Labs are only required if the patient has taken medication for more than 8 weeks.      Failed - HGB in normal range and within 360 days    Hemoglobin  Date Value Ref Range Status  01/07/2021 12.9 (L) 13.2 - 17.1 g/dL Final   HGB  Date Value Ref Range Status  02/06/2014 11.2 (L) 13.0 - 18.0 g/dL Final   Total hemoglobin  Date Value Ref Range Status  05/19/2012 8.7 (L) 13.5 - 18.0 g/dL Final         Passed - Cr in normal range and within 360 days    Creat  Date Value Ref Range Status  01/07/2021 1.12 0.70 - 1.22 mg/dL Final   Creatinine,U  Date Value Ref Range Status  08/12/2013 263.3 mg/dL Final         Passed - HCT in normal range and within 360 days    Hematocrit  Date Value Ref Range Status  05/28/2021 39.3 37.5 - 51.0 % Final         Passed - AST in normal range and within 360 days    AST  Date Value Ref Range Status  01/07/2021 15 10 - 35 U/L Final   SGOT(AST)  Date Value Ref Range Status  09/22/2013 28 15 - 37 Unit/L Final         Passed - ALT in normal range and within 360 days    ALT  Date Value Ref Range Status  01/07/2021 10 9 - 46 U/L Final   SGPT (ALT)  Date Value Ref Range Status  09/22/2013 20 U/L Final    Comment:    14-63 NOTE: New Reference Range 07/30/13          Passed - eGFR is 30 or above and within 360 days    GFR, Est African American  Date Value Ref Range Status  12/24/2019 74 > OR = 60 mL/min/1.54m Final   GFR, Est Non African American  Date Value Ref Range Status  12/24/2019 64 > OR = 60 mL/min/1.786mFinal   GFR  Date Value Ref Range Status  04/14/2015 73.79 >60.00 mL/min Final   eGFR  Date Value Ref Range Status   01/07/2021 64 > OR = 60 mL/min/1.7377minal    Comment:    The eGFR is based on the CKD-EPI 2021 equation. To calculate  the new eGFR from a previous Creatinine or Cystatin C result, go to https://www.kidney.org/professionals/ kdoqi/gfr%5Fcalculator          Passed - Patient is not pregnant      Passed - Valid encounter within last 12 months    Recent Outpatient Visits          5 months ago Annual physical exam   [facility]   10 months ago Pre-diabetes   [facility]   1 year ago Pre-diabetes   [facility] [place]   1 year ago Annual physical exam   [place]   1 year ago Constipation, unspecified constipation type  [facility] Parks Ranger, Devonne Doughty, DO      Future Appointments            In 1 month  [facility], [place]   In 1 month [place], Devonne Doughty, [facility], [place]   In 1 year [place], Ronda Fairly, [facility]

## 2021-06-21 ENCOUNTER — Ambulatory Visit (INDEPENDENT_AMBULATORY_CARE_PROVIDER_SITE_OTHER): Payer: PPO | Admitting: Physician Assistant

## 2021-06-21 ENCOUNTER — Other Ambulatory Visit: Payer: Self-pay

## 2021-06-21 DIAGNOSIS — E291 Testicular hypofunction: Secondary | ICD-10-CM

## 2021-06-21 MED ORDER — TESTOSTERONE CYPIONATE 200 MG/ML IM SOLN
60.0000 mg | Freq: Once | INTRAMUSCULAR | Status: AC
  Administered 2021-06-21: 60 mg via INTRAMUSCULAR

## 2021-06-21 NOTE — Progress Notes (Signed)
Testosterone IM Injection  Due to Hypogonadism patient is present today for a Testosterone Injection.  Medication: Testosterone Cypionate Dose: '60mg'$ / 0.10m Location: Left upper outer buttocks Lot: [id number]:10/2023  Patient tolerated well, no complications were noted.  Performed by: OGordy Clement CMA   Follow up: RTC in 2 weeks for Testosterone injection

## 2021-06-21 NOTE — Telephone Encounter (Signed)
Wife states that pt is out of his testosterone and will need new RX before upcoming appt on 6/263/23

## 2021-06-22 ENCOUNTER — Ambulatory Visit: Payer: PPO | Admitting: Physician Assistant

## 2021-06-22 ENCOUNTER — Encounter: Payer: Self-pay | Admitting: Family Medicine

## 2021-06-23 DIAGNOSIS — H2512 Age-related nuclear cataract, left eye: Secondary | ICD-10-CM | POA: Diagnosis not present

## 2021-06-27 ENCOUNTER — Other Ambulatory Visit: Payer: Self-pay | Admitting: Urology

## 2021-06-28 ENCOUNTER — Other Ambulatory Visit: Payer: Self-pay | Admitting: Cardiovascular Disease

## 2021-06-28 NOTE — Telephone Encounter (Signed)
Could you please contact the patient for a 12 month follow up? Thank you so much.

## 2021-06-30 ENCOUNTER — Encounter: Payer: Self-pay | Admitting: Ophthalmology

## 2021-06-30 IMAGING — CR DG HIP (WITH OR WITHOUT PELVIS) 2-3V*L*
1 series · 3 of 3 positions shown · non-contrast
Comparison: None.

CLINICAL DATA: Spasming of left hip. Two falls.

EXAM:
DG HIP (WITH OR WITHOUT PELVIS) 2-3V LEFT

[Series 1: dg hip unilat w or w/o pelvis 2-3 views  · non-contrast · 0.14mm/px · 3 of 3 slices shown]
[im 1/3]
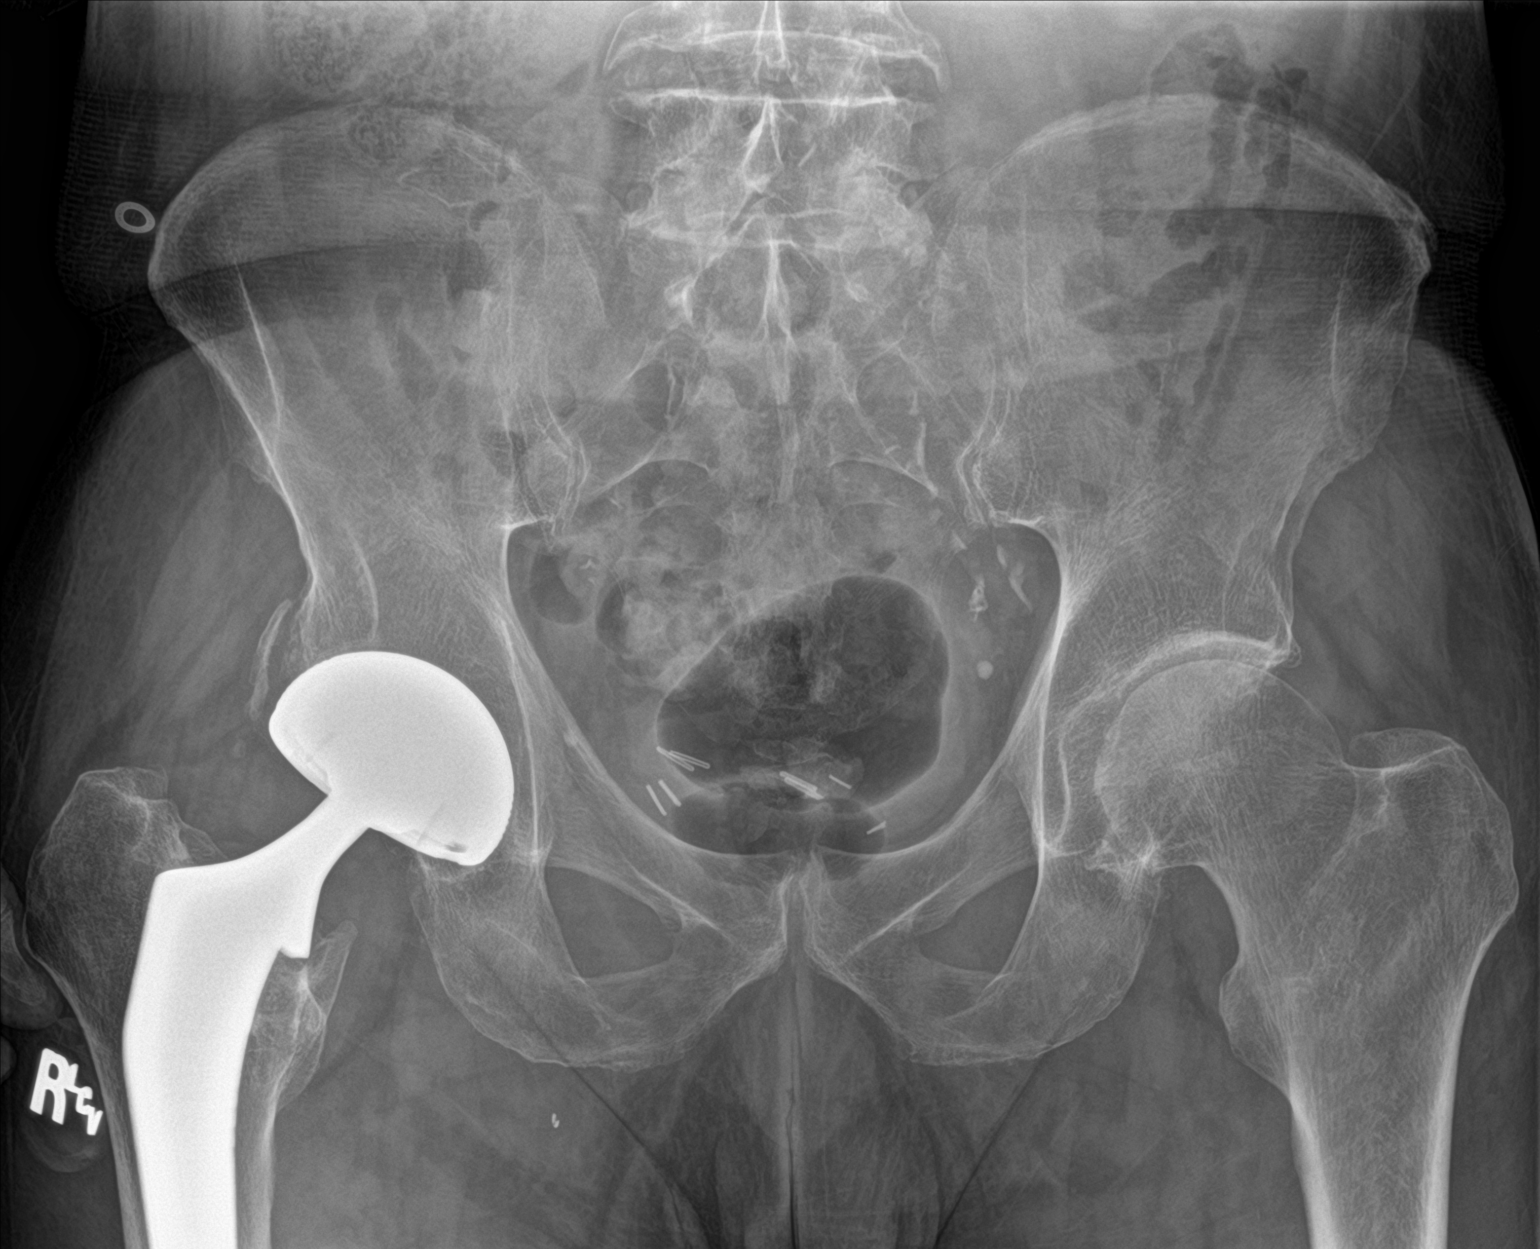
[im 2/3]
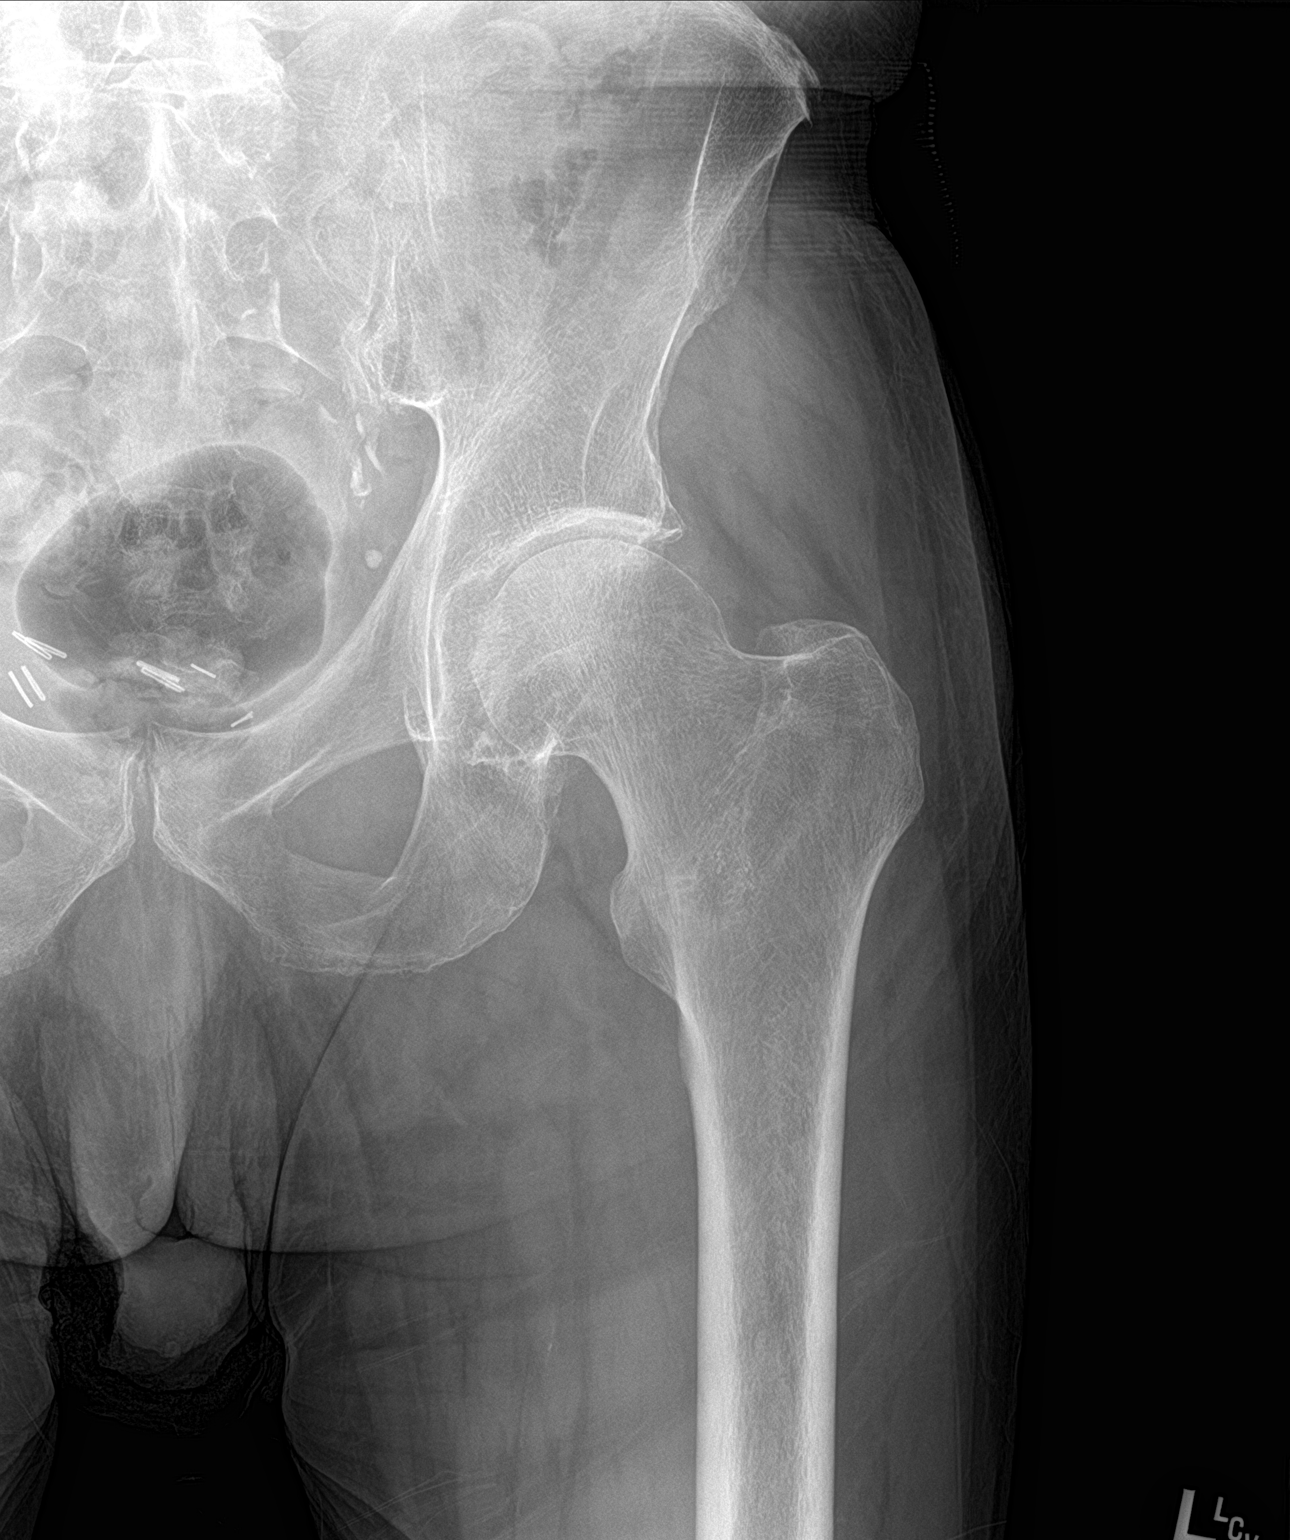
[im 3/3]
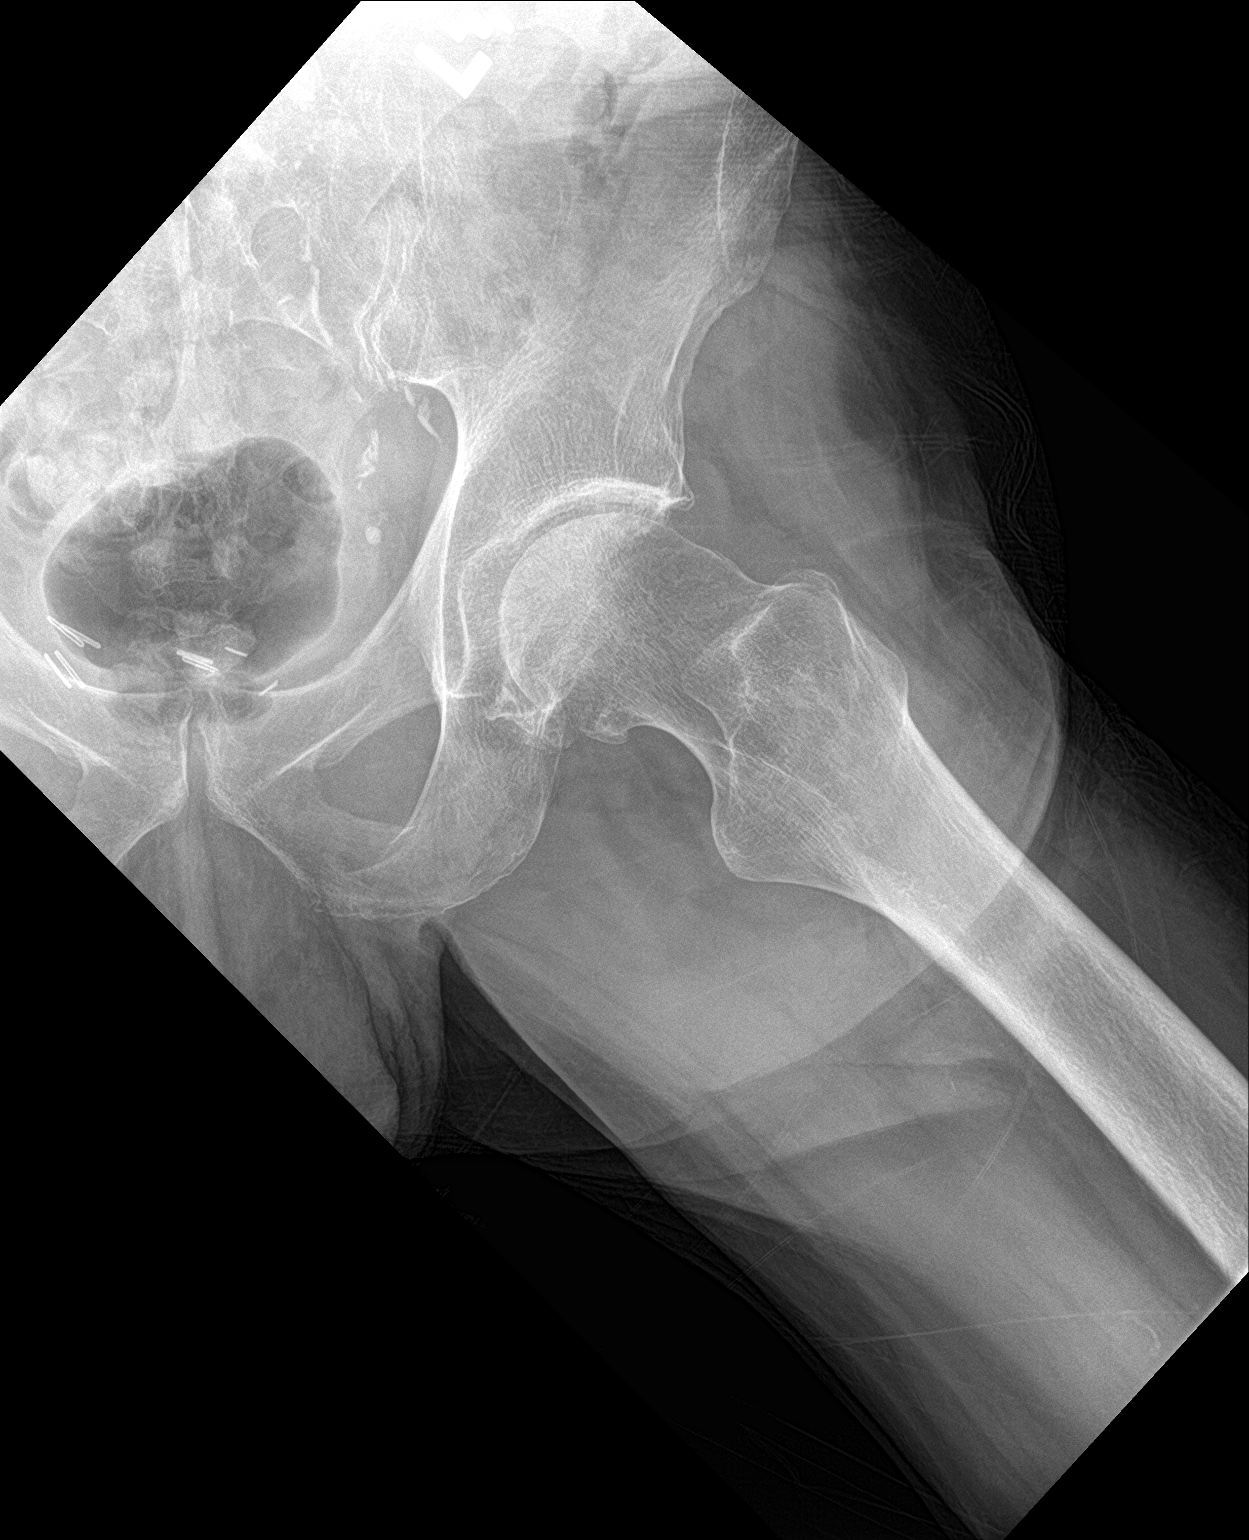

[3 of 3 positions shown; findings below may reference images not displayed]

FINDINGS: The patient is status post right hip replacement. The visualized
hardware is in good position although the femoral component is not
completely visualized. Degenerative changes are seen in the lower
lumbar spine. Mild degenerative changes in the left hip. No
fractures in the left hip.
IMPRESSION: No left hip fracture.  Mild degenerative changes in the left hip.

## 2021-07-05 ENCOUNTER — Ambulatory Visit (INDEPENDENT_AMBULATORY_CARE_PROVIDER_SITE_OTHER): Payer: PPO | Admitting: Physician Assistant

## 2021-07-05 ENCOUNTER — Encounter: Payer: Self-pay | Admitting: Urology

## 2021-07-05 DIAGNOSIS — E291 Testicular hypofunction: Secondary | ICD-10-CM

## 2021-07-05 MED ORDER — OXYCODONE HCL 5 MG PO TABS
ORAL_TABLET | ORAL | Status: AC
  Filled 2021-07-05: qty 2

## 2021-07-05 MED ORDER — TESTOSTERONE CYPIONATE 200 MG/ML IM SOLN
60.0000 mg | Freq: Once | INTRAMUSCULAR | Status: AC
  Administered 2021-07-05: 60 mg via INTRAMUSCULAR

## 2021-07-07 ENCOUNTER — Ambulatory Visit
Admission: RE | Admit: 2021-07-07 | Discharge: 2021-07-07 | Disposition: A | Discharge status: Home / Self Care | Payer: PPO | Attending: Ophthalmology | Admitting: Ophthalmology

## 2021-07-07 ENCOUNTER — Encounter: Payer: Self-pay | Admitting: Ophthalmology

## 2021-07-07 ENCOUNTER — Ambulatory Visit: Payer: PPO | Admitting: Anesthesiology

## 2021-07-07 ENCOUNTER — Encounter
Admission: RE | Disposition: A | Discharge status: Home / Self Care | Payer: Self-pay | Source: Home / Self Care | Attending: Ophthalmology

## 2021-07-07 ENCOUNTER — Other Ambulatory Visit: Payer: Self-pay

## 2021-07-07 DIAGNOSIS — Z87891 Personal history of nicotine dependence: Secondary | ICD-10-CM | POA: Diagnosis not present

## 2021-07-07 DIAGNOSIS — I10 Essential (primary) hypertension: Secondary | ICD-10-CM | POA: Diagnosis not present

## 2021-07-07 DIAGNOSIS — H401123 Primary open-angle glaucoma, left eye, severe stage: Secondary | ICD-10-CM | POA: Diagnosis not present

## 2021-07-07 DIAGNOSIS — I480 Paroxysmal atrial fibrillation: Secondary | ICD-10-CM | POA: Diagnosis not present

## 2021-07-07 DIAGNOSIS — I2510 Atherosclerotic heart disease of native coronary artery without angina pectoris: Secondary | ICD-10-CM | POA: Insufficient documentation

## 2021-07-07 DIAGNOSIS — H25812 Combined forms of age-related cataract, left eye: Secondary | ICD-10-CM | POA: Diagnosis not present

## 2021-07-07 DIAGNOSIS — Z951 Presence of aortocoronary bypass graft: Secondary | ICD-10-CM | POA: Diagnosis not present

## 2021-07-07 DIAGNOSIS — H2512 Age-related nuclear cataract, left eye: Secondary | ICD-10-CM | POA: Diagnosis not present

## 2021-07-07 HISTORY — DX: Presence of dental prosthetic device (complete) (partial): Z97.2

## 2021-07-07 HISTORY — PX: CATARACT EXTRACTION W/PHACO: SHX586

## 2021-07-07 SURGERY — PHACOEMULSIFICATION, CATARACT, WITH IOL INSERTION
Anesthesia: General | Site: Eye | Laterality: Left

## 2021-07-07 MED ORDER — SIGHTPATH DOSE#1 NA HYALUR & NA CHOND-NA HYALUR IO KIT
PACK | INTRAOCULAR | Status: DC | PRN
  Administered 2021-07-07: 1 via OPHTHALMIC

## 2021-07-07 MED ORDER — SIGHTPATH DOSE#1 BSS IO SOLN
INTRAOCULAR | Status: DC | PRN
  Administered 2021-07-07 (×2): 15 mL

## 2021-07-07 MED ORDER — FENTANYL CITRATE (PF) 100 MCG/2ML IJ SOLN
INTRAMUSCULAR | Status: DC | PRN
  Administered 2021-07-07: 50 ug via INTRAVENOUS

## 2021-07-07 MED ORDER — TETRACAINE HCL 0.5 % OP SOLN
1.0000 [drp] | OPHTHALMIC | Status: DC | PRN
  Administered 2021-07-07 (×3): 1 [drp] via OPHTHALMIC

## 2021-07-07 MED ORDER — NA CHONDROIT SULF-NA HYALURON 40-30 MG/ML IO SOSY
INTRAOCULAR | Status: DC | PRN
  Administered 2021-07-07: 0.5 mL via INTRAOCULAR

## 2021-07-07 MED ORDER — DEXMEDETOMIDINE (PRECEDEX) IN NS 20 MCG/5ML (4 MCG/ML) IV SYRINGE
PREFILLED_SYRINGE | INTRAVENOUS | Status: DC | PRN
  Administered 2021-07-07: 10 ug via INTRAVENOUS

## 2021-07-07 MED ORDER — ARMC OPHTHALMIC DILATING DROPS: OPHTHALMIC | Status: DC | PRN

## 2021-07-07 MED ORDER — LACTATED RINGERS IV SOLN: INTRAVENOUS | Status: DC

## 2021-07-07 MED ORDER — SIGHTPATH DOSE#1 BSS IO SOLN
INTRAOCULAR | Status: DC | PRN
  Administered 2021-07-07: 71 mL via OPHTHALMIC

## 2021-07-07 MED ORDER — SIGHTPATH DOSE#1 BSS IO SOLN
INTRAOCULAR | Status: DC | PRN
  Administered 2021-07-07: 1 mL

## 2021-07-07 MED ORDER — CEFUROXIME OPHTHALMIC INJECTION 1 MG/0.1 ML
INJECTION | OPHTHALMIC | Status: DC | PRN
  Administered 2021-07-07: 0.1 mL via INTRACAMERAL

## 2021-07-07 SURGICAL SUPPLY — 23 items
BLADE DUAL KAHOOK SINGLE USE (BLADE) ×1 IMPLANT
CANNULA ANT/CHMB 27G (MISCELLANEOUS) IMPLANT
CANNULA ANT/CHMB 27GA (MISCELLANEOUS) IMPLANT
CATARACT SUITE SIGHTPATH (MISCELLANEOUS) ×2 IMPLANT
FEE CATARACT SUITE SIGHTPATH (MISCELLANEOUS) ×2 IMPLANT
GLOVE SRG 8 PF TXTR STRL LF DI (GLOVE) ×2 IMPLANT
GLOVE SURG ENC TEXT LTX SZ7.5 (GLOVE) ×3 IMPLANT
GLOVE SURG GAMMEX PI TX LF 7.5 (GLOVE) IMPLANT
GLOVE SURG UNDER POLY LF SZ8 (GLOVE) ×2
ICLIP (OPHTHALMIC RELATED) ×1 IMPLANT
LENS IOL TECNIS EYHANCE 18.5 (Intraocular Lens) ×1 IMPLANT
NDL FILTER BLUNT 18X1 1/2 (NEEDLE) ×2 IMPLANT
NDL RETROBULBAR .5 NSTRL (NEEDLE) IMPLANT
NEEDLE FILTER BLUNT 18X 1/2SAF (NEEDLE) ×1
NEEDLE FILTER BLUNT 18X1 1/2 (NEEDLE) ×1 IMPLANT
PACK VIT ANT 23G (MISCELLANEOUS) IMPLANT
RING MALYGIN 7.0 (MISCELLANEOUS) IMPLANT
SUT ETHILON 10-0 CS-B-6CS-B-6 (SUTURE)
SUT VICRYL  9 0 (SUTURE)
SUT VICRYL 9 0 (SUTURE) IMPLANT
SUTURE EHLN 10-0 CS-B-6CS-B-6 (SUTURE) IMPLANT
SYR 3ML LL SCALE MARK (SYRINGE) ×3 IMPLANT
WATER STERILE IRR 250ML POUR (IV SOLUTION) ×3 IMPLANT

## 2021-07-07 NOTE — Transfer of Care (Signed)
Immediate Anesthesia Transfer of Care Note  Patient: [name]  Procedure(s) Performed: CATARACT EXTRACTION PHACO AND INTRAOCULAR LENS PLACEMENT (IOC) LEFT (Left: Eye) Goniotomy (Left: Eye)  Patient Location: PACU  Anesthesia Type: General  Level of Consciousness: awake, alert  and patient cooperative  Airway and Oxygen Therapy: Patient Spontanous Breathing and Patient connected to supplemental oxygen  Post-op Assessment: Post-op Vital signs reviewed, Patient's Cardiovascular Status Stable, Respiratory Function Stable, Patent Airway and No signs of Nausea or vomiting  Post-op Vital Signs: Reviewed and stable  Complications: No notable events documented.

## 2021-07-07 NOTE — H&P (Signed)
[name]   Primary Care Physician:  [name], DO Ophthalmologist: Dr. Leandrew Koyanagi  Pre-Procedure History & Physical: HPI:  [name] is a 88 y.o. male here for ophthalmic surgery.   Past Medical History:  Diagnosis Date   Arthritis    Coronary artery disease    a. 1993 s/p PCI/BMS LAD;  b. 2003 PCI RCA;  c. 09/2006 ISR LAD->PCI, PCI RCA;  d. 02/2007 MV: no ischemia;  d. 01/2012 MV: EF 74%, no ischemia.   History of prostate cancer    Hyperlipidemia    Hyperthyroidism    Low testosterone    PAF (paroxysmal atrial fibrillation) ([place])    a. previously on amio ->stopped in 2013;  b. recurrent PAF 01/2012->Pradaxa and amio initiated, subsequently came off of pradaxa 2/2 cost;  c. 02/2012 Echo: EF 55-60%, Gr 1 DD.   Prostate cancer ([place])    S/P CABG x 4    Wears dentures    partial upper and lower    Past Surgical History:  Procedure Laterality Date   APPENDECTOMY     Dr. Bary Castilla   CARDIAC CATHETERIZATION  2008   CLIPPING OF ATRIAL APPENDAGE Left 05/18/2012   Procedure: CLIPPING OF ATRIAL APPENDAGE;  Surgeon: [name], MD;  Location: [place];  Service: Open Heart Surgery;  Laterality: Left;   COLONOSCOPY  2013   COLONOSCOPY WITH PROPOFOL N/A 07/12/2016   Procedure: COLONOSCOPY WITH PROPOFOL;  Surgeon: [name], MD;  [facility] ENDOSCOPY;  Service: Endoscopy;  Laterality: N/A;   CORONARY ARTERY BYPASS GRAFT N/A 05/18/2012   Procedure: CORONARY ARTERY BYPASS GRAFTING (CABG);  Surgeon: [name], MD;  Location: [place];  Service: Open Heart Surgery;  Laterality: N/A;  Times 4 using left internal mammary artery and endoscopically harvested right saphenous vein   ESOPHAGOGASTRODUODENOSCOPY (EGD) WITH PROPOFOL N/A 10/24/2017   Procedure: ESOPHAGOGASTRODUODENOSCOPY (EGD) WITH PROPOFOL;  Surgeon: [name], MD;  Location: [facility] ENDOSCOPY;  Service: Endoscopy;  Laterality: N/A;   HERNIA REPAIR     s/p mesh bilaterally, Dr. Bary Castilla    INTRAOPERATIVE TRANSESOPHAGEAL ECHOCARDIOGRAM N/A 05/18/2012   Procedure: INTRAOPERATIVE TRANSESOPHAGEAL ECHOCARDIOGRAM;  Surgeon: [name], MD;  Location: [place];  Service: Open Heart Surgery;  Laterality: N/A;   IR ESOPHAGUS DILITATION RETRO FLUORO     LEFT HEART CATHETERIZATION WITH CORONARY ANGIOGRAM Bilateral 05/16/2012   Procedure: LEFT HEART CATHETERIZATION WITH CORONARY ANGIOGRAM;  Surgeon: Burnell Blanks, MD;  Location: [facility] CATH LAB;  Service: Cardiovascular;  Laterality: Bilateral;   MAZE N/A 05/18/2012   Procedure: MAZE;  Surgeon: [name], MD;  Location: [place];  Service: Open Heart Surgery;  Laterality: N/A;   MOHS SURGERY     PROSTATECTOMY     TOTAL HIP ARTHROPLASTY Right    Dr. [name]    Prior to Admission medications   Medication Sig Start Date End Date Taking? Authorizing Provider  amLODipine (NORVASC) 5 MG tablet Take 1 tablet (5 mg total) by mouth daily. 06/22/20  Yes Gollan, Kathlene November, MD  bisacodyl (DULCOLAX) 10 MG suppository Place 10 mg rectally daily as needed for moderate constipation.   Yes [provider]  celecoxib (CELEBREX) 100 MG capsule TAKE ONE CAPSULE BY MOUTH TWICE A DAY Patient taking differently: Take 100 mg by mouth daily. 06/08/21  Yes Karamalegos, Devonne Doughty, DO  Cholecalciferol (VITAMIN D3) 1.25 MG (50000 UT) CAPS Take 5,000 Units by mouth daily.   Yes [provider]  dorzolamide-timolol (COSOPT) 22.3-6.8 MG/ML ophthalmic solution  1 drop 2 (two) times daily. 05/17/19  Yes [provider]  ezetimibe (ZETIA) 10 MG tablet TAKE ONE TABLET BY MOUTH DAILY 06/28/21  Yes Gollan, Kathlene November, MD  FERROUS SULFATE PO Take by mouth once a week.   Yes [provider]  fluticasone (FLONASE) 50 MCG/ACT nasal spray SPRAY TWO SPRAYS IN EACH NOSTRIL ONCE DAILY - USE FOR 4-6 WEEKS THEN STOP AND USE SEASONALLY OR AS NEEDED 04/11/20  Yes Karamalegos, Devonne Doughty, DO  latanoprost (XALATAN) 0.005 % ophthalmic solution 0.005 drops  daily. 05/17/19  Yes [provider]  polyethylene glycol powder (GLYCOLAX/MIRALAX) 17 GM/SCOOP powder Take 17-34 g by mouth daily. For constipation 07/18/19  Yes Karamalegos, Devonne Doughty, DO  SENNA PO Take by mouth daily.   Yes [provider]  testosterone cypionate (DEPOTESTOSTERONE CYPIONATE) 200 MG/ML injection INJECT 0.3 MLS INTO THE MUSCLE EVERY 14 DAYS 06/28/21  Yes Stoioff, Ronda Fairly, MD  traZODone (DESYREL) 50 MG tablet Take 50 mg by mouth at bedtime. 04/09/21  Yes [provider]  vitamin C (ASCORBIC ACID) 250 MG tablet Take 250 mg by mouth daily.   Yes [provider]    Allergies as of 05/24/2021 - Review Complete 04/14/2021  Allergen Reaction Noted   Ambien [zolpidem tartrate] Other (See Comments) 01/25/2012   Pravastatin sodium  12/02/2013   Rosuvastatin calcium  05/19/2014   Zolpidem tartrate  10/05/2010    Family History  Problem Relation Age of Onset   Coronary artery disease Mother    Heart disease Mother    Coronary artery disease Sister    Diabetes Sister    Heart disease Sister 61       deceased   Diabetes Father    Heart disease Brother     Social History   Socioeconomic History   Marital status: Married    Spouse name: [name] Hucks   Number of children: 1   Years of education: JD   Highest education level: Bachelor's degree (e.g., BA, AB, BS)  Occupational History   Occupation: Retired Civil engineer, 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 He was born in [place] and went to [place] in [place]. Daughter in [place], 2 step-daughters. Retired from [employer].   Social Determinants of Health   Financial Resource Strain: Low Risk  (01/14/2020)   Overall Financial Resource Strain (CARDIA)    Difficulty of Paying Living Expenses: Not hard at all  Food Insecurity: No Food Insecurity (01/14/2020)   Hunger Vital Sign    Worried About Running Out of Food in the Last Year: Never true    Ran Out of Food in the Last Year: Never true  Transportation Needs: No Transportation Needs (01/14/2020)   PRAPARE - Hydrologist (Medical): No    Lack of Transportation (Non-Medical): No  Physical Activity: Insufficiently  Active (01/14/2020)   Exercise Vital Sign    Days of Exercise per Week: 7 days    Minutes of Exercise per Session: 20 min  Stress: No Stress Concern Present (01/14/2020)   [place]    Feeling of Stress : Not at all  Social Connections: Somewhat Isolated (12/06/2016)   Social Connection and Isolation Panel [NHANES]    Frequency of Communication with Friends and Family: Once a week    Frequency of Social Gatherings with Friends and Family: Once a week    Attends Religious Services: More than 4 times per year    Active Member of [employer] or Organizations: No    Attends Archivist Meetings: Never    Marital Status: Married  Human resources officer Violence: Not At Risk (12/06/2016)   Humiliation, Afraid, Rape, and Kick questionnaire    Fear of Current or Ex-Partner: No    Emotionally Abused: No    Physically Abused: No    Sexually Abused: No    Review of Systems: See HPI, otherwise negative ROS  Physical Exam: BP (!) 151/77   Pulse (!) 59   Temp (!) 97.3 F (36.3 C) (Temporal)   Resp 16   Ht '6\' 1"'$  (1.854 m)   Wt 61.2 kg   SpO2 96%   BMI 17.81 kg/m  General:   Alert,  pleasant and cooperative in NAD Head:   Normocephalic and atraumatic. Lungs:  Clear to auscultation.    Heart:  Regular rate and rhythm.   Impression/Plan: [name] is here for ophthalmic surgery.  Risks, benefits, limitations, and alternatives regarding ophthalmic surgery have been reviewed with the patient.  Questions have been answered.  All parties agreeable.   Leandrew Koyanagi, MD  07/07/2021, 12:28 PM

## 2021-07-07 NOTE — Op Note (Signed)
PREOPERATIVE DIAGNOSIS:  Nuclear sclerotic cataract left eye. 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 Item Serial No. Manufacturer Lot No. LRB No. Used Action  LENS IOL TECNIS EYHANCE 18.5 - [id number] Intraocular Lens LENS IOL TECNIS EYHANCE 18.5 [id number] SIGHTPATH  Left 1 Implanted    SURGEON:  Wyonia Hough, MD   ANESTHESIA:  Topical with tetracaine drops augmented with 1% preservative-free intracameral lidocaine.    COMPLICATIONS:  None.   DESCRIPTION OF PROCEDURE:  The patient was identified in the holding room and transported to the operating room and placed in the supine position under the operating microscope.  The left eye was identified as the operative eye and it was prepped and draped in the usual sterile ophthalmic fashion.   A 1 millimeter clear-corneal paracentesis was made at the 5:30 position.  0.5 ml of preservative-free 1% lidocaine was injected into the anterior chamber.  The anterior chamber was filled with Viscoat viscoelastic.  A 2.4 millimeter keratome was used to make a near-clear corneal incision at the 2:30 position. The microscope was adjusted and a gonioprism was used to visulaize the trabecular meshwork.  The [facility] Dual Blade was advanced across the anterior chamber under viscoelastic.  The blade was used to mark the trabecular meshwork at the 7:30 position.  The blade was placed two clock hours clockwise into the meshwork.  Proper postioning was confirmed.  The blade ws passed  counterclockwise through the meshwork to excise approximately two to three clock-hours of trabecular meshwork.   A curvilinear capsulorrhexis was made with a cystotome and capsulorrhexis forceps.  Balanced salt solution was used to hydrodissect and hydrodelineate the nucleus.   Phacoemulsification was then used in stop and chop fashion to remove the lens nucleus and epinucleus.  The remaining cortex was then removed using the irrigation and aspiration handpiece. Provisc was then placed into the capsular bag to distend it for lens placement.  A lens was then injected into the capsular bag.  The remaining viscoelastic was aspirated.   Wounds were hydrated with balanced salt solution.  The anterior chamber was inflated to a physiologic pressure with balanced salt solution.  No wound leaks were noted. Cefuroxime 0.1 ml of a '10mg'$ /ml solution was injected into the anterior chamber for a dose of 1 mg of intracameral antibiotic at the completion of the case.  The patient was taken to the recovery room in stable condition without complications of anesthesia or surgery. \

## 2021-07-07 NOTE — Anesthesia Procedure Notes (Signed)
Procedure Name: MAC Date/Time: 07/07/2021 12:43 PM  Performed by: Jeannene Patella, CRNAPre-anesthesia Checklist: Patient identified, Emergency Drugs available, Suction available, Timeout performed and Patient being monitored Patient Re-evaluated:Patient Re-evaluated prior to induction Oxygen Delivery Method: Nasal cannula Placement Confirmation: positive ETCO2

## 2021-07-07 NOTE — Anesthesia Preprocedure Evaluation (Addendum)
Anesthesia Evaluation  Patient identified by MRN, date of birth, ID band Patient awake    Reviewed: Allergy & Precautions, H&P , NPO status , Patient's Chart, lab work & pertinent test results, reviewed documented beta blocker date and time   Airway Mallampati: II  TM Distance: >3 FB Neck ROM: full    Dental no notable dental hx.    Pulmonary former smoker,    Pulmonary exam normal breath sounds clear to auscultation       Cardiovascular hypertension, + CAD  Normal cardiovascular exam+ dysrhythmias Atrial Fibrillation  Rhythm:regular Rate:Normal  CABG 2014, doing well   Neuro/Psych negative neurological ROS  negative psych ROS   GI/Hepatic negative GI ROS, Neg liver ROS,   Endo/Other  negative endocrine ROS  Renal/GU CRFRenal disease  negative genitourinary   Musculoskeletal  (+) Arthritis ,   Abdominal   Peds  Hematology negative hematology ROS (+)   Anesthesia Other Findings   Reproductive/Obstetrics negative OB ROS                             Anesthesia Physical Anesthesia Plan  ASA: 2  Anesthesia Plan: MAC   Post-op Pain Management:    Induction:   PONV Risk Score and Plan:   Airway Management Planned:   Additional Equipment:   Intra-op Plan:   Post-operative Plan:   Informed Consent: I have reviewed the patients History and Physical, chart, labs and discussed the procedure including the risks, benefits and alternatives for the proposed anesthesia with the patient or authorized representative who has indicated his/her understanding and acceptance.     Dental Advisory Given  Plan Discussed with: CRNA  Anesthesia Plan Comments:        Anesthesia Quick Evaluation

## 2021-07-07 NOTE — Anesthesia Postprocedure Evaluation (Signed)
Anesthesia Post Note  Patient: [name]  Procedure(s) Performed: CATARACT EXTRACTION PHACO AND INTRAOCULAR LENS PLACEMENT (IOC) LEFT (Left: Eye) Goniotomy (Left: Eye)     Patient location during evaluation: PACU Anesthesia Type: MAC Level of consciousness: awake and alert Pain management: pain level controlled Vital Signs Assessment: post-procedure vital signs reviewed and stable Respiratory status: spontaneous breathing, nonlabored ventilation, respiratory function stable and patient connected to nasal cannula oxygen Cardiovascular status: stable and blood pressure returned to baseline Postop Assessment: no apparent nausea or vomiting Anesthetic complications: no   No notable events documented.  Trecia Rogers

## 2021-07-08 ENCOUNTER — Encounter: Payer: Self-pay | Admitting: Ophthalmology

## 2021-07-09 ENCOUNTER — Ambulatory Visit (INDEPENDENT_AMBULATORY_CARE_PROVIDER_SITE_OTHER): Payer: PPO | Admitting: Family Medicine

## 2021-07-09 ENCOUNTER — Ambulatory Visit: Payer: PPO

## 2021-07-09 ENCOUNTER — Other Ambulatory Visit: Payer: Self-pay | Admitting: Family Medicine

## 2021-07-09 ENCOUNTER — Encounter: Payer: Self-pay | Admitting: Family Medicine

## 2021-07-09 VITALS — BP 111/57 | HR 58 | Ht 73.0 in | Wt 137.8 lb

## 2021-07-09 DIAGNOSIS — I129 Hypertensive chronic kidney disease with stage 1 through stage 4 chronic kidney disease, or unspecified chronic kidney disease: Secondary | ICD-10-CM

## 2021-07-09 DIAGNOSIS — F5104 Psychophysiologic insomnia: Secondary | ICD-10-CM

## 2021-07-09 DIAGNOSIS — Z0000 Encounter for general adult medical examination without abnormal findings: Secondary | ICD-10-CM

## 2021-07-09 DIAGNOSIS — E559 Vitamin D deficiency, unspecified: Secondary | ICD-10-CM

## 2021-07-09 DIAGNOSIS — R7303 Prediabetes: Secondary | ICD-10-CM

## 2021-07-09 DIAGNOSIS — G309 Alzheimer's disease, unspecified: Secondary | ICD-10-CM | POA: Diagnosis not present

## 2021-07-09 DIAGNOSIS — F02A0 Dementia in other diseases classified elsewhere, mild, without behavioral disturbance, psychotic disturbance, mood disturbance, and anxiety: Secondary | ICD-10-CM

## 2021-07-09 DIAGNOSIS — D649 Anemia, unspecified: Secondary | ICD-10-CM | POA: Diagnosis not present

## 2021-07-09 DIAGNOSIS — M47816 Spondylosis without myelopathy or radiculopathy, lumbar region: Secondary | ICD-10-CM

## 2021-07-09 DIAGNOSIS — E538 Deficiency of other specified B group vitamins: Secondary | ICD-10-CM

## 2021-07-09 DIAGNOSIS — N183 Chronic kidney disease, stage 3 unspecified: Secondary | ICD-10-CM

## 2021-07-09 DIAGNOSIS — E782 Mixed hyperlipidemia: Secondary | ICD-10-CM

## 2021-07-09 NOTE — Progress Notes (Signed)
Subjective:    Patient ID: [name], male    DOB: 1932/06/21, 88 y.o.   MRN: [id number]  [name] is a 88 y.o. male presenting on 07/09/2021 for Prediabetes   HPI  Pre-Diabetes Previously controlled A1c 5.6 to 5.8 Due today for A1c CBGs: not checking Meds: none Currently not on ACEi / ARB Lifestyle: - Diet reduced intake Denies hypoglycemia, polyuria, visual changes, numbness or tingling.   Palliative Care Patient, followed by [facility] No longer seeing him. But can return if need  Insomnia difficulty, restless medications worse. Sundowners worse. Failed meds trazodone, seroquel, remeron Seems Tylenol PM helps him rest at night actually helps - not causing side effects   GERD / Dysphagia w/ esophageal stricture Moderate protein calorie malnutrition secondary to dysphagia and dementia Dementia, mild, alzheimer's FAST 6A  Weight down to 135-140 lbs but stable Appetite improved   - He had recent history of esophageal stricture on barium swallow done in 09/2017. He had EGD and esophageal stretching in 10/2017 by Dr Allen Norris  Has had repeat swallow study and determined no further intervention - At this time, still challenge with the swallowing. Sips water after each bite to help swallowing.   Loss of proximal muscle loss History of prior hip replacement Generalized weakness   Chronic Osteoarthritis, Back Pain Follow-up on chronic issue, he has tried some Tylenol Arthritis str 1-2 times a day with mild relief, Taking Meloxicam PRN. Using infrequently Using Tylenol QHS PM with good results. Followed by Orthopedic specialist. Admits issues with hips Denies any injury, fall, numbness tingling weakness    Paroxysmal Atrial Fibrillation S/p MAZE procedure Off Anticoagulation. Has maintained sinus rhythm He had one episode of pulse racing irregularly.   BP reduced due to wt loss On Amlodipine '5mg'$  daily   Previously Hypogonadism, low T Followed by [facility]  Urology Off testosterone  Glaucoma / Cataract Surgery on Wednesday Post-op recently Dry eye irritation       07/09/2021    2:15 PM 01/07/2021    9:05 AM 01/14/2020   11:37 AM  Depression screen PHQ 2/9  Decreased Interest 1 0 0  Down, Depressed, Hopeless 1 0 0  PHQ - 2 Score 2 0 0  Altered sleeping 3 0   Tired, decreased energy 3 0   Change in appetite 0 0   Feeling bad or failure about yourself  0 0   Trouble concentrating 3 0   Moving slowly or fidgety/restless 3 0   Suicidal thoughts 0 0   PHQ-9 Score 14 0   Difficult doing work/chores Very difficult Not difficult at all     Social History   Tobacco Use   Smoking status: Former    Types: Cigars    Quit date: 10/04/1988    Years since quitting: 32.7   Smokeless tobacco: Former    Types: Chew    Quit date: 09/04/1991  Vaping Use   Vaping Use: Never used  Substance Use Topics   Alcohol use: Yes    Comment: 1/3 cup of red wine occassionally   Drug use: No    Review of Systems Per HPI unless specifically indicated above     Objective:    BP (!) 111/57   Pulse (!) 58   Ht '6\' 1"'$  (1.854 m)   Wt 137 lb 12.8 oz (62.5 kg)   SpO2 99%   BMI 18.18 kg/m   Wt Readings from Last 3 Encounters:  07/09/21 137 lb 12.8 oz (62.5 kg)  07/07/21 135 lb (61.2 kg)  06/02/21 138 lb (62.6 kg)    Physical Exam Vitals and nursing note reviewed.  Constitutional:      General: He is not in acute distress.    Appearance: He is well-developed. He is not diaphoretic.     Comments: Chronically ill but currently mostly well appearing, thin with some weight loss, comfortable, cooperative  HENT:     Head: Normocephalic and atraumatic.  Eyes:     General:        Right eye: No discharge.        Left eye: No discharge.     Conjunctiva/sclera: Conjunctivae normal.  Neck:     Thyroid: No thyromegaly.  Cardiovascular:     Rate and Rhythm: Normal rate and regular rhythm.     Heart sounds: Normal heart sounds. No murmur  heard. Pulmonary:     Effort: Pulmonary effort is normal. No respiratory distress.     Breath sounds: Normal breath sounds. No wheezing or rales.  Musculoskeletal:        General: Normal range of motion.     Cervical back: Normal range of motion and neck supple.     Comments: Proximal muscle atrophy lower extremity  Lymphadenopathy:     Cervical: No cervical adenopathy.  Skin:    General: Skin is warm and dry.     Findings: No erythema or rash.  Neurological:     Mental Status: He is alert and oriented to person, place, and time.  Psychiatric:        Behavior: Behavior normal.     Comments: Well groomed, good eye contact, normal speech and thoughts        Results for orders placed or performed in visit on 05/28/21  Hematocrit  Result Value Ref Range   Hematocrit 39.3 37.5 - 51.0 %  Testosterone  Result Value Ref Range   Testosterone 1,046 (H) 264 - 916 ng/dL  PSA  Result Value Ref Range   Prostate Specific Ag, Serum <0.1 0.0 - 4.0 ng/mL      Assessment & Plan:   Problem List Items Addressed This Visit     Pre-diabetes   Relevant Orders   COMPLETE METABOLIC PANEL WITH GFR   Hemoglobin A1c   Mild dementia ([place]) - Primary   Insomnia   Benign hypertension with CKD (chronic kidney disease) stage III (HCC)   Relevant Orders   COMPLETE METABOLIC PANEL WITH GFR   CBC with Differential/Platelet   Other Visit Diagnoses     Anemia, unspecified type       Relevant Orders   CBC with Differential/Platelet        May discontinue Amlodipine '5mg'$  daily - if you notice BP remains lower < 100 / 60, AND usually accompanied by symptoms - lightheaded, dizzy etc. If you continue it - and do well, that's fine.  May take the Tylenol PM if tolerating well.  Can call Palliative for a Home Visit only if needed.  Blood today for kidney function, iron, and A1c sugar.  Ask Eye Doctors about the Dry Eye.  Continue lubricating drops if you have / eye compresses.  Consider  future Donepezil (Aricept) memory medication in future.   No orders of the defined types were placed in this encounter.     Follow up plan: Return in about 6 months (around 01/08/2022) for 6 month fasting lab only then 1 week later Annual Physical.  Future labs ordered for 01/12/22   [name], [place]  [facility] 07/09/2021, 2:29 PM

## 2021-07-09 NOTE — Patient Instructions (Addendum)
Thank you for coming to the office today.  May discontinue Amlodipine '5mg'$  daily - if you notice BP remains lower < 100 / 60, AND usually accompanied by symptoms - lightheaded, dizzy etc. If you continue it - and do well, that's fine.  May take the Tylenol PM if tolerating well.  Can call Palliative for a Home Visit only if needed.  Blood today for kidney function, iron, and A1c sugar.  Ask Eye Doctors about the Dry Eye.  Continue lubricating drops if you have / eye compresses.  Consider future Donepezil (Aricept) memory medication in future.  DUE for FASTING BLOOD WORK (no food or drink after midnight before the lab appointment, only water or coffee without cream/sugar on the morning of)  SCHEDULE "Lab Only" visit in the morning at the clinic for lab draw in 6 MONTHS   - Make sure Lab Only appointment is at about 1 week before your next appointment, so that results will be available  For Lab Results, once available within 2-3 days of blood draw, you can can log in to MyChart online to view your results and a brief explanation. Also, we can discuss results at next follow-up visit.    Please schedule a Follow-up Appointment to: Return in about 6 months (around 01/08/2022) for 6 month fasting lab only then 1 week later Annual Physical.  If you have any other questions or concerns, please feel free to call the office or send a message through [place]. You may also schedule an earlier appointment if necessary.  Additionally, you may be receiving a survey about your experience at our office within a few days to 1 week by e-mail or mail. We value your feedback.  [name], DO [place]

## 2021-07-10 LAB — CBC WITH DIFFERENTIAL/PLATELET
Absolute Monocytes: 601 cells/uL (ref 200–950)
Basophils Absolute: 33 cells/uL (ref 0–200)
Basophils Relative: 0.5 %
Eosinophils Absolute: 198 cells/uL (ref 15–500)
Eosinophils Relative: 3 %
HCT: 37.1 % — ABNORMAL LOW (ref 38.5–50.0)
Hemoglobin: 12.2 g/dL — ABNORMAL LOW (ref 13.2–17.1)
Lymphs Abs: 1360 cells/uL (ref 850–3900)
MCH: 28.2 pg (ref 27.0–33.0)
MCHC: 32.9 g/dL (ref 32.0–36.0)
MCV: 85.9 fL (ref 80.0–100.0)
MPV: 12 fL (ref 7.5–12.5)
Monocytes Relative: 9.1 %
Neutro Abs: 4409 cells/uL (ref 1500–7800)
Neutrophils Relative %: 66.8 %
Platelets: 233 10*3/uL (ref 140–400)
RBC: 4.32 10*6/uL (ref 4.20–5.80)
RDW: 12.5 % (ref 11.0–15.0)
Total Lymphocyte: 20.6 %
WBC: 6.6 10*3/uL (ref 3.8–10.8)

## 2021-07-10 LAB — COMPLETE METABOLIC PANEL WITH GFR
AG Ratio: 2 (calc) (ref 1.0–2.5)
ALT: 12 U/L (ref 9–46)
AST: 13 U/L (ref 10–35)
Albumin: 4.5 g/dL (ref 3.6–5.1)
Alkaline phosphatase (APISO): 81 U/L (ref 35–144)
BUN/Creatinine Ratio: 15 (calc) (ref 6–22)
BUN: 24 mg/dL (ref 7–25)
CO2: 25 mmol/L (ref 20–32)
Calcium: 9.4 mg/dL (ref 8.6–10.3)
Chloride: 107 mmol/L (ref 98–110)
Creat: 1.65 mg/dL — ABNORMAL HIGH (ref 0.70–1.22)
Globulin: 2.3 g/dL (calc) (ref 1.9–3.7)
Glucose, Bld: 92 mg/dL (ref 65–139)
Potassium: 4.5 mmol/L (ref 3.5–5.3)
Sodium: 140 mmol/L (ref 135–146)
Total Bilirubin: 0.5 mg/dL (ref 0.2–1.2)
Total Protein: 6.8 g/dL (ref 6.1–8.1)
eGFR: 40 mL/min/{1.73_m2} — ABNORMAL LOW (ref >=60)

## 2021-07-10 LAB — HEMOGLOBIN A1C
Hgb A1c MFr Bld: 5.6 % of total Hgb (ref <5.7)
Mean Plasma Glucose: 114 mg/dL
eAG (mmol/L): 6.3 mmol/L

## 2021-07-19 ENCOUNTER — Ambulatory Visit: Payer: PPO | Admitting: Physician Assistant

## 2021-07-19 ENCOUNTER — Encounter: Payer: Self-pay | Admitting: Family Medicine

## 2021-07-19 DIAGNOSIS — H2511 Age-related nuclear cataract, right eye: Secondary | ICD-10-CM | POA: Diagnosis not present

## 2021-07-26 NOTE — Discharge Instructions (Signed)

## 2021-07-28 ENCOUNTER — Ambulatory Visit: Payer: PPO | Admitting: Anesthesiology

## 2021-07-28 ENCOUNTER — Ambulatory Visit
Admission: RE | Admit: 2021-07-28 | Discharge: 2021-07-28 | Disposition: A | Discharge status: Home / Self Care | Payer: PPO | Attending: Ophthalmology | Admitting: Ophthalmology

## 2021-07-28 ENCOUNTER — Other Ambulatory Visit: Payer: Self-pay

## 2021-07-28 ENCOUNTER — Encounter
Admission: RE | Disposition: A | Discharge status: Home / Self Care | Payer: Self-pay | Source: Home / Self Care | Attending: Ophthalmology

## 2021-07-28 ENCOUNTER — Encounter: Payer: Self-pay | Admitting: Ophthalmology

## 2021-07-28 DIAGNOSIS — N189 Chronic kidney disease, unspecified: Secondary | ICD-10-CM | POA: Diagnosis not present

## 2021-07-28 DIAGNOSIS — Z951 Presence of aortocoronary bypass graft: Secondary | ICD-10-CM | POA: Diagnosis not present

## 2021-07-28 DIAGNOSIS — M1990 Unspecified osteoarthritis, unspecified site: Secondary | ICD-10-CM | POA: Diagnosis not present

## 2021-07-28 DIAGNOSIS — E0590 Thyrotoxicosis, unspecified without thyrotoxic crisis or storm: Secondary | ICD-10-CM | POA: Insufficient documentation

## 2021-07-28 DIAGNOSIS — H401111 Primary open-angle glaucoma, right eye, mild stage: Secondary | ICD-10-CM | POA: Insufficient documentation

## 2021-07-28 DIAGNOSIS — K219 Gastro-esophageal reflux disease without esophagitis: Secondary | ICD-10-CM | POA: Insufficient documentation

## 2021-07-28 DIAGNOSIS — Z87891 Personal history of nicotine dependence: Secondary | ICD-10-CM | POA: Diagnosis not present

## 2021-07-28 DIAGNOSIS — I2510 Atherosclerotic heart disease of native coronary artery without angina pectoris: Secondary | ICD-10-CM | POA: Insufficient documentation

## 2021-07-28 DIAGNOSIS — R7309 Other abnormal glucose: Secondary | ICD-10-CM

## 2021-07-28 DIAGNOSIS — I4891 Unspecified atrial fibrillation: Secondary | ICD-10-CM | POA: Diagnosis not present

## 2021-07-28 DIAGNOSIS — I129 Hypertensive chronic kidney disease with stage 1 through stage 4 chronic kidney disease, or unspecified chronic kidney disease: Secondary | ICD-10-CM | POA: Diagnosis not present

## 2021-07-28 DIAGNOSIS — E039 Hypothyroidism, unspecified: Secondary | ICD-10-CM | POA: Diagnosis not present

## 2021-07-28 DIAGNOSIS — H2511 Age-related nuclear cataract, right eye: Secondary | ICD-10-CM | POA: Insufficient documentation

## 2021-07-28 DIAGNOSIS — I499 Cardiac arrhythmia, unspecified: Secondary | ICD-10-CM | POA: Diagnosis not present

## 2021-07-28 HISTORY — PX: CATARACT EXTRACTION W/PHACO: SHX586

## 2021-07-28 SURGERY — PHACOEMULSIFICATION, CATARACT, WITH IOL INSERTION
Anesthesia: Monitor Anesthesia Care | Site: Eye | Laterality: Right

## 2021-07-28 MED ORDER — FENTANYL CITRATE (PF) 100 MCG/2ML IJ SOLN
INTRAMUSCULAR | Status: DC | PRN
  Administered 2021-07-28: 25 ug via INTRAVENOUS

## 2021-07-28 MED ORDER — SIGHTPATH DOSE#1 BSS IO SOLN
INTRAOCULAR | Status: DC | PRN
  Administered 2021-07-28: 15 mL

## 2021-07-28 MED ORDER — TETRACAINE HCL 0.5 % OP SOLN
1.0000 [drp] | OPHTHALMIC | Status: DC | PRN
  Administered 2021-07-28 (×3): 1 [drp] via OPHTHALMIC

## 2021-07-28 MED ORDER — BRIMONIDINE TARTRATE-TIMOLOL 0.2-0.5 % OP SOLN: OPHTHALMIC | Status: DC | PRN

## 2021-07-28 MED ORDER — DEXMEDETOMIDINE (PRECEDEX) IN NS 20 MCG/5ML (4 MCG/ML) IV SYRINGE
PREFILLED_SYRINGE | INTRAVENOUS | Status: DC | PRN
  Administered 2021-07-28: 5 ug via INTRAVENOUS

## 2021-07-28 MED ORDER — ARMC OPHTHALMIC DILATING DROPS: OPHTHALMIC | Status: DC | PRN

## 2021-07-28 MED ORDER — SIGHTPATH DOSE#1 NA HYALUR & NA CHOND-NA HYALUR IO KIT
PACK | INTRAOCULAR | Status: DC | PRN
  Administered 2021-07-28: 1 via OPHTHALMIC

## 2021-07-28 MED ORDER — SIGHTPATH DOSE#1 BSS IO SOLN
INTRAOCULAR | Status: DC | PRN
  Administered 2021-07-28: 47 mL via OPHTHALMIC

## 2021-07-28 MED ORDER — CEFUROXIME OPHTHALMIC INJECTION 1 MG/0.1 ML
INJECTION | OPHTHALMIC | Status: DC | PRN
  Administered 2021-07-28: 0.1 mL via INTRACAMERAL

## 2021-07-28 MED ORDER — SIGHTPATH DOSE#1 BSS IO SOLN
INTRAOCULAR | Status: DC | PRN
  Administered 2021-07-28: 1 mL via INTRAMUSCULAR

## 2021-07-28 SURGICAL SUPPLY — 12 items
BLADE DUAL KAHOOK SINGLE USE (BLADE) ×1 IMPLANT
CATARACT SUITE SIGHTPATH (MISCELLANEOUS) ×3 IMPLANT
FEE CATARACT SUITE SIGHTPATH (MISCELLANEOUS) ×3 IMPLANT
GLOVE SRG 8 PF TXTR STRL LF DI (GLOVE) ×3 IMPLANT
GLOVE SURG ENC TEXT LTX SZ7.5 (GLOVE) ×4 IMPLANT
GLOVE SURG UNDER POLY LF SZ8 (GLOVE) ×3
LENS IOL TECNIS EYHANCE 18.0 (Intraocular Lens) ×1 IMPLANT
NDL FILTER BLUNT 18X1 1/2 (NEEDLE) ×3 IMPLANT
NEEDLE FILTER BLUNT 18X 1/2SAF (NEEDLE) ×1
NEEDLE FILTER BLUNT 18X1 1/2 (NEEDLE) ×2 IMPLANT
SYR 3ML LL SCALE MARK (SYRINGE) ×4 IMPLANT
WATER STERILE IRR 250ML POUR (IV SOLUTION) ×4 IMPLANT

## 2021-07-28 NOTE — Anesthesia Preprocedure Evaluation (Signed)
Anesthesia Evaluation  Patient identified by MRN, date of birth, ID band Patient awake    Reviewed: Allergy & Precautions, H&P , NPO status , Patient's Chart, lab work & pertinent test results, reviewed documented beta blocker date and time   Airway Mallampati: II  TM Distance: >3 FB Neck ROM: full    Dental no notable dental hx.    Pulmonary former smoker,    Pulmonary exam normal        Cardiovascular hypertension, + CAD and + CABG (CABG  x4 vessel in 2014)  Normal cardiovascular exam+ dysrhythmias Atrial Fibrillation      Neuro/Psych negative neurological ROS  negative psych ROS   GI/Hepatic Neg liver ROS, GERD  Controlled,  Endo/Other  Hyperthyroidism   Renal/GU CRFRenal disease  negative genitourinary   Musculoskeletal  (+) Arthritis ,   Abdominal Normal abdominal exam  (+)   Peds  Hematology negative hematology ROS (+)   Anesthesia Other Findings   Reproductive/Obstetrics negative OB ROS                             Anesthesia Physical  Anesthesia Plan  ASA: 3  Anesthesia Plan: MAC   Post-op Pain Management: Minimal or no pain anticipated   Induction: Intravenous  PONV Risk Score and Plan: 1 and TIVA, Midazolam and Treatment may vary due to age or medical condition  Airway Management Planned: Natural Airway and Nasal Cannula  Additional Equipment:   Intra-op Plan:   Post-operative Plan:   Informed Consent: I have reviewed the patients History and Physical, chart, labs and discussed the procedure including the risks, benefits and alternatives for the proposed anesthesia with the patient or authorized representative who has indicated his/her understanding and acceptance.     Dental advisory given  Plan Discussed with: CRNA  Anesthesia Plan Comments:         Anesthesia Quick Evaluation

## 2021-07-28 NOTE — Anesthesia Postprocedure Evaluation (Signed)
Anesthesia Post Note  Patient: [name]  Procedure(s) Performed: CATARACT EXTRACTION PHACO AND INTRAOCULAR LENS PLACEMENT (IOC) RIGHT  [place] DUAL BLADE GONIOTOMY 7.63 01:01.0 (Right: Eye) Goniotomy (Right)     Patient location during evaluation: PACU Anesthesia Type: MAC Level of consciousness: awake and alert Pain management: pain level controlled Vital Signs Assessment: post-procedure vital signs reviewed and stable Respiratory status: nonlabored ventilation and spontaneous breathing Cardiovascular status: blood pressure returned to baseline Postop Assessment: no apparent nausea or vomiting Anesthetic complications: no   No notable events documented.  [name] [employer]

## 2021-07-28 NOTE — Op Note (Signed)
PREOPERATIVE DIAGNOSIS:  Nuclear sclerotic cataract  right eye. 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 Item Serial No. Manufacturer Lot No. LRB No. Used Action  LENS IOL TECNIS EYHANCE 18.0 - [id number] Intraocular Lens LENS IOL TECNIS EYHANCE 18.0 [id number] [employer]  Right 1 Implanted    SURGEON:  Wyonia Hough, MD   ANESTHESIA:  Topical with tetracaine drops augmented with 1% preservative-free intracameral lidocaine.    COMPLICATIONS:  None.   DESCRIPTION OF PROCEDURE:  The patient was identified in the holding room and transported to the operating room and placed in the supine position under the operating microscope.  The right eye was identified as the operative eye and it was prepped and draped in the usual sterile ophthalmic fashion.   A 1 millimeter clear-corneal paracentesis was made at the 12:00 position.  0.5 ml of preservative-free 1% lidocaine was injected into the anterior chamber.  The anterior chamber was filled with Viscoat viscoelastic.  A 2.4 millimeter keratome was used to make a near-clear corneal incision at the 9:00 position. The microscope was adjusted and a gonioprism was used to visulaize the trabecular meshwork.  The [facility] Dual Blade was advanced across the anterior chamber under viscoelastic.  The blade was used to mark the trabecular meshwork at the 1:30 position.  The blade was placed two clock hours clockwise into the meshwork.  Proper postioning was confirmed.   The blade ws passed counterclockwise through the meshwork to excise approximately two to three clock-hours of trabecular meshwork.   A curvilinear capsulorrhexis was made with a cystotome and capsulorrhexis forceps.  Balanced salt solution was used to hydrodissect and hydrodelineate the nucleus.   Phacoemulsification was then used in stop and chop fashion to remove the lens nucleus and epinucleus.  The remaining cortex was then removed using the irrigation and aspiration handpiece. Provisc was then placed into the capsular bag to distend it for lens placement.  A lens was then injected into the capsular bag.  The remaining viscoelastic was aspirated.   Wounds were hydrated with balanced salt solution.  The anterior chamber was inflated to a physiologic pressure with balanced salt solution.  No wound leaks were noted. Cefuroxime 0.1 ml of a '10mg'$ /ml solution was injected into the anterior chamber for a dose of 1 mg of intracameral antibiotic at the completion of the case.  The patient was taken to the recovery room in stable condition without complications of anesthesia or surgery.

## 2021-07-28 NOTE — Transfer of Care (Signed)
Immediate Anesthesia Transfer of Care Note  Patient: [name]  Procedure(s) Performed: CATARACT EXTRACTION PHACO AND INTRAOCULAR LENS PLACEMENT (IOC) RIGHT  [place] DUAL BLADE GONIOTOMY 7.63 01:01.0 (Right: Eye) Goniotomy (Right)  Patient Location: PACU  Anesthesia Type: MAC  Level of Consciousness: awake, alert  and patient cooperative  Airway and Oxygen Therapy: Patient Spontanous Breathing and Patient connected to supplemental oxygen  Post-op Assessment: Post-op Vital signs reviewed, Patient's Cardiovascular Status Stable, Respiratory Function Stable, Patent Airway and No signs of Nausea or vomiting  Post-op Vital Signs: Reviewed and stable  Complications: No notable events documented.

## 2021-07-28 NOTE — H&P (Signed)
[facility]   Primary Care Physician:  [name], DO Ophthalmologist: Dr. Leandrew Koyanagi  Pre-Procedure History & Physical: HPI:  [name] is a 88 y.o. male here for ophthalmic surgery.   Past Medical History:  Diagnosis Date   Arthritis    Coronary artery disease    a. 1993 s/p PCI/BMS LAD;  b. 2003 PCI RCA;  c. 09/2006 ISR LAD->PCI, PCI RCA;  d. 02/2007 MV: no ischemia;  d. 01/2012 MV: EF 74%, no ischemia.   History of prostate cancer    Hyperlipidemia    Hyperthyroidism    Low testosterone    PAF (paroxysmal atrial fibrillation) ([address])    a. previously on amio ->stopped in 2013;  b. recurrent PAF 01/2012->Pradaxa and amio initiated, subsequently came off of pradaxa 2/2 cost;  c. 02/2012 Echo: EF 55-60%, Gr 1 DD.   Prostate cancer ([place])    S/P CABG x 4    Wears dentures    partial upper and lower    Past Surgical History:  Procedure Laterality Date   APPENDECTOMY     Dr. Bary Castilla   CARDIAC CATHETERIZATION  2008   CATARACT EXTRACTION W/PHACO Left 07/07/2021   Procedure: CATARACT EXTRACTION PHACO AND INTRAOCULAR LENS PLACEMENT ([place]) LEFT;  Surgeon: Leandrew Koyanagi, MD;  Location: [place];  Service: Ophthalmology;  Laterality: Left;  9.52 1:16.2   CLIPPING OF ATRIAL APPENDAGE Left 05/18/2012   Procedure: CLIPPING OF ATRIAL APPENDAGE;  Surgeon: [name], MD;  Location: [place];  Service: Open Heart Surgery;  Laterality: Left;   COLONOSCOPY  2013   COLONOSCOPY WITH PROPOFOL N/A 07/12/2016   Procedure: COLONOSCOPY WITH PROPOFOL;  Surgeon: [name], MD;  Location: [facility] ENDOSCOPY;  Service: Endoscopy;  Laterality: N/A;   CORONARY ARTERY BYPASS GRAFT N/A 05/18/2012   Procedure: CORONARY ARTERY BYPASS GRAFTING (CABG);  Surgeon: [name], MD;  Location: [place];  Service: Open Heart Surgery;  Laterality: N/A;  Times 4 using left internal mammary artery and endoscopically harvested right saphenous vein   ESOPHAGOGASTRODUODENOSCOPY  (EGD) WITH PROPOFOL N/A 10/24/2017   Procedure: ESOPHAGOGASTRODUODENOSCOPY (EGD) WITH PROPOFOL;  Surgeon: [name], MD;  Location: [facility] ENDOSCOPY;  Service: Endoscopy;  Laterality: N/A;   HERNIA REPAIR     s/p mesh bilaterally, Dr. Bary Castilla   INTRAOPERATIVE TRANSESOPHAGEAL ECHOCARDIOGRAM N/A 05/18/2012   Procedure: INTRAOPERATIVE TRANSESOPHAGEAL ECHOCARDIOGRAM;  Surgeon: [name], MD;  Location: [place];  Service: Open Heart Surgery;  Laterality: N/A;   IR ESOPHAGUS DILITATION RETRO FLUORO     LEFT HEART CATHETERIZATION WITH CORONARY ANGIOGRAM Bilateral 05/16/2012   Procedure: LEFT HEART CATHETERIZATION WITH CORONARY ANGIOGRAM;  Surgeon: Burnell Blanks, MD;  Location: [facility] CATH LAB;  Service: Cardiovascular;  Laterality: Bilateral;   MAZE N/A 05/18/2012   Procedure: MAZE;  Surgeon: [name], MD;  Location: [place];  Service: Open Heart Surgery;  Laterality: N/A;   MOHS SURGERY     PROSTATECTOMY     TOTAL HIP ARTHROPLASTY Right    Dr. [name]    Prior to Admission medications   Medication Sig Start Date End Date Taking? Authorizing Provider  amLODipine (NORVASC) 5 MG tablet Take 1 tablet (5 mg total) by mouth daily. 06/22/20  Yes Gollan, Kathlene November, MD  bisacodyl (DULCOLAX) 10 MG suppository Place 10 mg rectally daily as needed for moderate constipation.   Yes [provider]  Cholecalciferol (VITAMIN D3) 1.25 MG (50000 UT) CAPS Take 5,000 Units by mouth daily.   Yes [provider]  dorzolamide-timolol (COSOPT) 22.3-6.8 MG/ML ophthalmic solution 1 drop 2 (two) times daily. 05/17/19  Yes [provider]  ezetimibe (ZETIA) 10 MG tablet TAKE ONE TABLET BY MOUTH DAILY 06/28/21  Yes Gollan, Kathlene November, MD  FERROUS SULFATE PO Take by mouth once a week.   Yes [provider]  fluticasone (FLONASE) 50 MCG/ACT nasal spray SPRAY TWO SPRAYS IN EACH NOSTRIL ONCE DAILY - USE FOR 4-6 WEEKS THEN STOP AND USE SEASONALLY OR AS NEEDED 04/11/20  Yes Karamalegos, Devonne Doughty, DO  latanoprost (XALATAN) 0.005 % ophthalmic solution 0.005 drops daily. 05/17/19  Yes [provider]  polyethylene glycol powder (GLYCOLAX/MIRALAX) 17 GM/SCOOP powder Take 17-34 g by mouth daily. For constipation 07/18/19  Yes Karamalegos, Devonne Doughty, DO  SENNA PO Take by mouth daily.   Yes [provider]  vitamin C (ASCORBIC ACID) 250 MG tablet Take 250 mg by mouth daily.   Yes [provider]  celecoxib (CELEBREX) 100 MG capsule TAKE ONE CAPSULE BY MOUTH TWICE A DAY Patient not taking: Reported on 07/28/2021 06/08/21   [name], DO    Allergies as of 05/24/2021 - Review Complete 04/14/2021  Allergen Reaction Noted   Ambien [zolpidem tartrate] Other (See Comments) 01/25/2012   Pravastatin sodium  12/02/2013   Rosuvastatin calcium  05/19/2014   Zolpidem tartrate  10/05/2010    Family History  Problem Relation Age of Onset   Coronary artery disease Mother    Heart disease Mother    Coronary artery disease Sister    Diabetes Sister    Heart disease Sister 19       deceased   Diabetes Father    Heart disease Brother     Social History   Socioeconomic History   Marital status: Married    Spouse name: [name] Hucks   Number of children: 1   Years of education: JD   Highest education level: Bachelor's degree (e.g., BA, AB, BS)  Occupational History   Occupation: Retired Civil engineer, 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 He was born in  [place] and went to [employer] in [place]. Daughter in [place], 2 step-daughters. Retired from [employer].   Social Determinants of Health   Financial Resource Strain: Low Risk  (01/14/2020)   Overall Financial Resource Strain (CARDIA)    Difficulty of Paying Living Expenses: Not hard at all  Food Insecurity: No Food Insecurity (01/14/2020)   Hunger Vital Sign    Worried About Running Out of Food in the Last Year: Never true    Ran Out of Food in the Last Year: Never true  Transportation Needs: No Transportation Needs (01/14/2020)   PRAPARE - Hydrologist (Medical): No    Lack of Transportation (Non-Medical): No  Physical Activity: Insufficiently Active (01/14/2020)   Exercise Vital Sign  Days of Exercise per Week: 7 days    Minutes of Exercise per Session: 20 min  Stress: No Stress Concern Present (01/14/2020)   [place]    Feeling of Stress : Not at all  Social Connections: Somewhat Isolated (12/06/2016)   Social Connection and Isolation Panel [NHANES]    Frequency of Communication with Friends and Family: Once a week    Frequency of Social Gatherings with Friends and Family: Once a week    Attends Religious Services: More than 4 times per year    Active Member of [employer] or Organizations: No    Attends Archivist Meetings: Never    Marital Status: Married  Human resources officer Violence: Not At Risk (12/06/2016)   Humiliation, Afraid, Rape, and Kick questionnaire    Fear of Current or Ex-Partner: No    Emotionally Abused: No    Physically Abused: No    Sexually Abused: No    Review of Systems: See HPI, otherwise negative ROS  Physical Exam: BP 135/78   Pulse (!) 58   Temp 98.1 F (36.7 C) (Temporal)   Resp 16   Ht '6\' 1"'$  (1.854 m)   Wt 60.3 kg   SpO2 100%   BMI 17.55 kg/m  General:   Alert,  pleasant and cooperative in NAD Head:  Normocephalic and atraumatic. Lungs:   Clear to auscultation.    Heart:  Regular rate and rhythm.   Impression/Plan: [name] is here for ophthalmic surgery.  Risks, benefits, limitations, and alternatives regarding ophthalmic surgery have been reviewed with the patient.  Questions have been answered.  All parties agreeable.   Leandrew Koyanagi, MD  07/28/2021, 9:55 AM

## 2021-07-28 NOTE — Anesthesia Procedure Notes (Signed)
Procedure Name: MAC Date/Time: 07/28/2021 10:50 AM  Performed by: Silvana Newness, CRNAPre-anesthesia Checklist: Patient identified, Emergency Drugs available, Suction available, Patient being monitored and Timeout performed Patient Re-evaluated:Patient Re-evaluated prior to induction Oxygen Delivery Method: Nasal cannula Induction Type: IV induction Placement Confirmation: positive ETCO2 and CO2 detector

## 2021-07-29 ENCOUNTER — Encounter: Payer: Self-pay | Admitting: Ophthalmology

## 2021-08-03 ENCOUNTER — Telehealth: Payer: Self-pay

## 2021-08-03 NOTE — Telephone Encounter (Signed)
I left a message for the patient to call back and schedule their Medicare Annual Wellness Visit (AWV) virtually, by telephone, or face-to-face.   Meliah Appleman, CMA [phone]  

## 2021-08-13 ENCOUNTER — Ambulatory Visit (INDEPENDENT_AMBULATORY_CARE_PROVIDER_SITE_OTHER): Payer: PPO

## 2021-08-13 VITALS — Wt 133.0 lb

## 2021-08-13 DIAGNOSIS — Z0000 Encounter for general adult medical examination without abnormal findings: Secondary | ICD-10-CM | POA: Diagnosis not present

## 2021-08-13 NOTE — Progress Notes (Signed)
Virtual Visit via Telephone Note  I connected with  [name] on 08/13/21 at  2:45 PM EDT by telephone and verified that I am speaking with the correct person using two identifiers. Also talked to wife, Neoma Laming  Location: Patient: home Provider: [facility] Persons participating in the virtual visit: [place]   I discussed the limitations, risks, security and privacy concerns of performing an evaluation and management service by telephone and the availability of in person appointments. The patient expressed understanding and agreed to proceed.  Interactive audio and video telecommunications were attempted between this nurse and patient, however failed, due to patient having technical difficulties OR patient did not have access to video capability.  We continued and completed visit with audio only.  Some vital signs may be absent or patient reported.   Dionisio David, LPN  Subjective:   LEMOYNE [name] is a 88 y.o. male who presents for Medicare Annual/Subsequent preventive examination.  Review of Systems           Objective:    There were no vitals filed for this visit. There is no height or weight on file to calculate BMI.     07/28/2021    9:41 AM 07/07/2021   11:45 AM 01/14/2020   11:36 AM 02/06/2019    5:41 PM 12/30/2018   11:23 AM 12/25/2018    1:37 PM 12/19/2017    1:31 PM  Advanced Directives  Does Patient Have a Medical Advance Directive? Yes Yes Yes Yes Yes Yes Yes  Type of Paramedic of [place];Living will [place];Living will [place];Living will Living will Living will Living will;Healthcare Power of [place];Living will  Does patient want to make changes to medical advance directive?  No - Patient declined       Copy of [name] in Chart?  No - copy requested No - copy requested    No - copy requested    Current Medications  (verified) Outpatient Encounter Medications as of 08/13/2021  Medication Sig   amLODipine (NORVASC) 5 MG tablet Take 1 tablet (5 mg total) by mouth daily.   Cholecalciferol (VITAMIN D3) 1.25 MG (50000 UT) CAPS Take 5,000 Units by mouth daily.   dorzolamide-timolol (COSOPT) 22.3-6.8 MG/ML ophthalmic solution 1 drop 2 (two) times daily.   ezetimibe (ZETIA) 10 MG tablet TAKE ONE TABLET BY MOUTH DAILY   FERROUS SULFATE PO Take by mouth once a week.   Influenza Vac High-Dose Quad (FLUZONE HIGH-DOSE QUADRIVALENT) 0.7 ML SUSY    Influenza vac split quadrivalent PF (FLUZONE HIGH-DOSE) 0.5 ML injection    polyethylene glycol powder (GLYCOLAX/MIRALAX) 17 GM/SCOOP powder Take 17-34 g by mouth daily. For constipation   SENNA PO Take by mouth daily.   simvastatin (ZOCOR) 20 MG tablet    vitamin C (ASCORBIC ACID) 250 MG tablet Take 250 mg by mouth daily.   baclofen (LIORESAL) 10 MG tablet  (Patient not taking: Reported on 08/13/2021)   bisacodyl (DULCOLAX) 10 MG suppository Place 10 mg rectally daily as needed for moderate constipation. (Patient not taking: Reported on 08/13/2021)   celecoxib (CELEBREX) 100 MG capsule TAKE ONE CAPSULE BY MOUTH TWICE A DAY (Patient not taking: Reported on 07/28/2021)   fluticasone (FLONASE) 50 MCG/ACT nasal spray SPRAY TWO SPRAYS IN EACH NOSTRIL ONCE DAILY - USE FOR 4-6 WEEKS THEN STOP AND USE SEASONALLY OR AS NEEDED   ipratropium (ATROVENT) 0.06 % nasal spray  (Patient not  taking: Reported on 08/13/2021)   latanoprost (XALATAN) 0.005 % ophthalmic solution 0.005 drops daily. (Patient not taking: Reported on 08/13/2021)   meloxicam (MOBIC) 15 MG tablet Take 1 tablet every day by oral route. (Patient not taking: Reported on 08/13/2021)   meloxicam (MOBIC) 7.5 MG tablet Take 1 tablet every day by oral route. (Patient not taking: Reported on 08/13/2021)   pantoprazole (PROTONIX) 40 MG tablet  (Patient not taking: Reported on 08/13/2021)   No facility-administered encounter medications on file  as of 08/13/2021.    Allergies (verified) Ambien [zolpidem tartrate], Pravastatin sodium, Rosuvastatin calcium, and Zolpidem tartrate   History: Past Medical History:  Diagnosis Date   Arthritis    Coronary artery disease    a. 1993 s/p PCI/BMS LAD;  b. 2003 PCI RCA;  c. 09/2006 ISR LAD->PCI, PCI RCA;  d. 02/2007 MV: no ischemia;  d. 01/2012 MV: EF 74%, no ischemia.   History of prostate cancer    Hyperlipidemia    Hyperthyroidism    Low testosterone    PAF (paroxysmal atrial fibrillation) ([place])    a. previously on amio ->stopped in 2013;  b. recurrent PAF 01/2012->Pradaxa and amio initiated, subsequently came off of pradaxa 2/2 cost;  c. 02/2012 Echo: EF 55-60%, Gr 1 DD.   Prostate cancer ([place])    S/P CABG x 4    Wears dentures    partial upper and lower   Past Surgical History:  Procedure Laterality Date   APPENDECTOMY     Dr. Bary Castilla   CARDIAC CATHETERIZATION  2008   CATARACT EXTRACTION W/PHACO Left 07/07/2021   Procedure: CATARACT EXTRACTION PHACO AND INTRAOCULAR LENS PLACEMENT ([place]) LEFT;  Surgeon: Leandrew Koyanagi, MD;  Location: [place];  Service: Ophthalmology;  Laterality: Left;  9.52 1:16.2   CATARACT EXTRACTION W/PHACO Right 07/28/2021   Procedure: CATARACT EXTRACTION PHACO AND INTRAOCULAR LENS PLACEMENT ([place]) RIGHT  [place] DUAL BLADE GONIOTOMY 7.63 01:01.0;  Surgeon: Leandrew Koyanagi, MD;  Location: [place];  Service: Ophthalmology;  Laterality: Right;   CLIPPING OF ATRIAL APPENDAGE Left 05/18/2012   Procedure: CLIPPING OF ATRIAL APPENDAGE;  Surgeon: [name], MD;  Location: [place];  Service: Open Heart Surgery;  Laterality: Left;   COLONOSCOPY  2013   COLONOSCOPY WITH PROPOFOL N/A 07/12/2016   Procedure: COLONOSCOPY WITH PROPOFOL;  Surgeon: [name], MD;  Location[facility] ENDOSCOPY;  Service: Endoscopy;  Laterality: N/A;   CORONARY ARTERY BYPASS GRAFT N/A 05/18/2012   Procedure: CORONARY ARTERY BYPASS GRAFTING (CABG);  Surgeon: [name], MD;  Location: [place];  Service: Open Heart Surgery;  Laterality: N/A;  Times 4 using left internal mammary artery and endoscopically harvested right saphenous vein   ESOPHAGOGASTRODUODENOSCOPY (EGD) WITH PROPOFOL N/A 10/24/2017   Procedure: ESOPHAGOGASTRODUODENOSCOPY (EGD) WITH PROPOFOL;  Surgeon: [name], MD;  Location: [facility] ENDOSCOPY;  Service: Endoscopy;  Laterality: N/A;   HERNIA REPAIR     s/p mesh bilaterally, Dr. Bary Castilla   INTRAOPERATIVE TRANSESOPHAGEAL ECHOCARDIOGRAM N/A 05/18/2012   Procedure: INTRAOPERATIVE TRANSESOPHAGEAL ECHOCARDIOGRAM;  Surgeon: [name], MD;  Location: [place];  Service: Open Heart Surgery;  Laterality: N/A;   IR ESOPHAGUS DILITATION RETRO FLUORO     LEFT HEART CATHETERIZATION WITH CORONARY ANGIOGRAM Bilateral 05/16/2012   Procedure: LEFT HEART CATHETERIZATION WITH CORONARY ANGIOGRAM;  Surgeon: Burnell Blanks, MD;  Location: [facility] CATH LAB;  Service: Cardiovascular;  Laterality: Bilateral;   MAZE N/A 05/18/2012   Procedure: MAZE;  Surgeon: [name], MD;  Location: [place];  Service:  Open Heart Surgery;  Laterality: N/A;   MOHS SURGERY     PROSTATECTOMY     TOTAL HIP ARTHROPLASTY Right    Dr. 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 He was born in [place] and went to [place] in [place]. Daughter in [place], 2 step-daughters. Retired from [employer].   Social Determinants of Health   Financial Resource Strain: Low Risk  (01/14/2020)   Overall Financial Resource Strain (CARDIA)    Difficulty of Paying Living Expenses: Not hard at all  Food Insecurity: No Food Insecurity (01/14/2020)   Hunger Vital Sign    Worried About Running Out of Food in the Last Year: Never true    Ran Out of Food in the Last Year: Never true  Transportation Needs: No Transportation Needs (01/14/2020)   PRAPARE - Hydrologist (Medical): No    Lack of Transportation (Non-Medical): No  Physical Activity: Insufficiently Active (08/13/2021)   Exercise Vital Sign    Days of Exercise per Week: 7 days    Minutes of Exercise per Session: 20 min  Stress: No Stress Concern Present (08/13/2021)   [place]    Feeling of Stress : Not at all  Social Connections: Somewhat Isolated (12/06/2016)   Social Connection and Isolation Panel [NHANES]    Frequency of Communication with Friends and Family: Once a week    Frequency of Social Gatherings with Friends and Family: Once a week    Attends Religious Services: More than 4 times per year    Active Member of [employer] or Organizations: No    Attends Music therapist: Never    Marital Status: Married    Tobacco Counseling Counseling given: Not Answered   Clinical Intake:  Pre-visit preparation completed: Yes  Pain : No/denies pain     Nutritional Risks: None Diabetes: No  How often do  you need to have someone help you when you read instructions, pamphlets, or other written materials from your doctor or pharmacy?: 1 - Never  Diabetic?no  Interpreter Needed?: No  Information entered by :: Kirke Shaggy, LPN   Activities of Daily Living  07/07/2021   11:57 AM 01/07/2021    9:05 AM  In your present state of health, do you have any difficulty performing the following activities:  Hearing? 0   Vision? 0 1  Difficulty concentrating or making decisions? 0 1  Walking or climbing stairs? 0   Dressing or bathing? 0 0  Doing errands, shopping?  1    Patient Care Team: [name], DO as PCP - General (Family Medicine) Minna Merritts, MD as Consulting Physician (Cardiology) Dingeldein, Remo Lipps, MD (Ophthalmology)  Indicate any recent Medical Services you may have received from other than [facility] providers in the past year (date may be approximate).     Assessment:   This is a routine wellness examination for [place].  Hearing/Vision screen No results found.  Dietary issues and exercise activities discussed:     Goals Addressed             This Visit's Progress    DIET - EAT MORE FRUITS AND VEGETABLES         Depression Screen    08/13/2021    2:54 PM 07/09/2021    2:15 PM 01/07/2021    9:05 AM 01/14/2020   11:37 AM 12/31/2019    2:05 PM 05/17/2019    1:25 PM 12/25/2018    1:34 PM  PHQ 2/9 Scores  PHQ - 2 Score 2 2 0 0 0 0 0  PHQ- 9 Score 6 14 0        Fall Risk    07/09/2021    2:14 PM 01/07/2021    9:05 AM 01/14/2020   11:37 AM 12/31/2019    2:04 PM 05/17/2019    1:25 PM  [place] in the past year? 0 0 0 0 0  Number falls in past yr: 0 0  0 0  Injury with Fall? 0 0  0 0  Risk for fall due to : Mental status change No Fall Risks Medication side effect;Impaired balance/gait;Impaired mobility    Follow up Falls evaluation completed Falls evaluation completed Falls evaluation completed;Education provided;Falls prevention  discussed Falls evaluation completed Falls evaluation completed    FALL RISK PREVENTION PERTAINING TO THE HOME:  Any stairs in or around the home? No  If so, are there any without handrails? No  Home free of loose throw rugs in walkways, pet beds, electrical cords, etc? Yes  Adequate lighting in your home to reduce risk of falls? Yes   ASSISTIVE DEVICES UTILIZED TO PREVENT FALLS:  Life alert? No  Use of a cane, walker or w/c? Yes  Grab bars in the bathroom? Yes  Shower chair or bench in shower? Yes  Elevated toilet seat or a handicapped toilet? Yes   Cognitive Function:declined    06/07/2017   11:18 AM 11/24/2015    2:46 PM 11/28/2014   11:06 AM  MMSE - Mini Mental State Exam  Orientation to time '5 5 5  '$ Orientation to Place '5 5 5  '$ Registration '3 3 3  '$ Attention/ Calculation '5 5 5  '$ Recall '3 3 3  '$ Language- name 2 objects '2 2 2  '$ Language- repeat '1 1 1  '$ Language- follow 3 step command '3 3 3  '$ Language- read & follow direction '1 1 1  '$ Write a sentence '1 1 1  '$ Copy design '1 1 1  '$ Total score '30 30 30        '$ 01/14/2020   11:41 AM 12/06/2016   10:03 AM  6CIT Screen  What  Year? 0 points 0 points  What month? 0 points 0 points  What time? 0 points 0 points  Count back from 20 0 points 0 points  Months in reverse 4 points 0 points  Repeat phrase 10 points 0 points  Total Score 14 points 0 points    Immunizations Immunization History  Administered Date(s) Administered   Influenza Split 10/05/2010, 10/27/2011, 10/10/2013   Influenza, High Dose Seasonal PF 09/23/2015, 09/19/2016, 09/17/2017, 08/27/2018, 10/20/2019   Influenza,inj,Quad PF,6+ Mos 09/18/2012, 11/04/2014   Influenza-Unspecified 09/25/2020   PFIZER(Purple Top)SARS-COV-2 Vaccination 02/11/2019, 03/04/2019, 10/21/2019, 06/12/2020   Pneumococcal Conjugate-13 02/19/2013   Pneumococcal Polysaccharide-23 11/15/2007   Tdap 08/05/2020    TDAP status: Up to date  Flu Vaccine status: Up to date  Pneumococcal vaccine  status: Up to date  Covid-19 vaccine status: Completed vaccines  Qualifies for Shingles Vaccine? Yes   Zostavax completed No   Shingrix Completed?: No.    Education has been provided regarding the importance of this vaccine. Patient has been advised to call insurance company to determine out of pocket expense if they have not yet received this vaccine. Advised may also receive vaccine at local pharmacy or Health Dept. Verbalized acceptance and understanding.  Screening Tests Health Maintenance  Topic Date Due   Zoster Vaccines- Shingrix (1 of 2) Never done   COVID-19 Vaccine (5 - Booster for Pfizer series) 08/07/2020   INFLUENZA VACCINE  08/10/2021   TETANUS/TDAP  08/06/2030   Pneumonia Vaccine 6+ Years old  Completed   HPV VACCINES  Aged Out    Health Maintenance  Health Maintenance Due  Topic Date Due   Zoster Vaccines- Shingrix (1 of 2) Never done   COVID-19 Vaccine (5 - Booster for Pfizer series) 08/07/2020   INFLUENZA VACCINE  08/10/2021    Colorectal cancer screening: No longer required.   Lung Cancer Screening: (Low Dose CT Chest recommended if Age 78-80 years, 30 pack-year currently smoking OR have quit w/in 15years.) does not qualify.   Additional Screening:  Hepatitis C Screening: does not qualify; Completed no  Vision Screening: Recommended annual ophthalmology exams for early detection of glaucoma and other disorders of the eye. Is the patient up to date with their annual eye exam?  Yes  Who is the provider or what is the name of the office in which the patient attends annual eye exams? [facility] If pt is not established with a provider, would they like to be referred to a provider to establish care? No .   Dental Screening: Recommended annual dental exams for proper oral hygiene  Community Resource Referral / Chronic Care Management: CRR required this visit?  No   CCM required this visit?  No      Plan:     I have personally reviewed and noted the  following in the patient's chart:   Medical and social history Use of alcohol, tobacco or illicit drugs  Current medications and supplements including opioid prescriptions. Patient is not currently taking opioid prescriptions. Functional ability and status Nutritional status Physical activity Advanced directives List of other physicians Hospitalizations, surgeries, and ER visits in previous 12 months Vitals Screenings to include cognitive, depression, and falls Referrals and appointments  In addition, I have reviewed and discussed with patient certain preventive protocols, quality metrics, and best practice recommendations. A written personalized care plan for preventive services as well as general preventive health recommendations were provided to patient.     Dionisio David, LPN   0/01/270   Nurse  Notes: none

## 2021-08-13 NOTE — Patient Instructions (Signed)
[name] , Thank you for taking time to come for your Medicare Wellness Visit. I appreciate your ongoing commitment to your health goals. Please review the following plan we discussed and let me know if I can assist you in the future.   Screening recommendations/referrals: Colonoscopy: aged out Recommended yearly ophthalmology/optometry visit for glaucoma screening and checkup Recommended yearly dental visit for hygiene and checkup  Vaccinations: Influenza vaccine: 09/25/20 Pneumococcal vaccine: 02/19/13 Tdap vaccine: 08/05/20 Shingles vaccine: n/d   Covid-19: 02/11/19, 03/04/19, 10/21/19, 06/12/20  Advanced directives: yes, copy needed  Conditions/risks identified: none  Next appointment: Follow up in one year for your annual wellness visit. 08/19/22 @ 1:30 pm by phone  Preventive Care 65 Years and Older, Male Preventive care refers to lifestyle choices and visits with your health care provider that can promote health and wellness. What does preventive care include? A yearly physical exam. This is also called an annual well check. Dental exams once or twice a year. Routine eye exams. Ask your health care provider how often you should have your eyes checked. Personal lifestyle choices, including: Daily care of your teeth and gums. Regular physical activity. Eating a healthy diet. Avoiding tobacco and drug use. Limiting alcohol use. Practicing safe sex. Taking low doses of aspirin every day. Taking vitamin and mineral supplements as recommended by your health care provider. What happens during an annual well check? The services and screenings done by your health care provider during your annual well check will depend on your age, overall health, lifestyle risk factors, and family history of disease. Counseling  Your health care provider may ask you questions about your: Alcohol use. Tobacco use. Drug use. Emotional well-being. Home and relationship well-being. Sexual  activity. Eating habits. History of falls. Memory and ability to understand (cognition). Work and work Statistician. Screening  You may have the following tests or measurements: Height, weight, and BMI. Blood pressure. Lipid and cholesterol levels. These may be checked every 5 years, or more frequently if you are over 34 years old. Skin check. Lung cancer screening. You may have this screening every year starting at age 55 if you have a 30-pack-year history of smoking and currently smoke or have quit within the past 15 years. Fecal occult blood test (FOBT) of the stool. You may have this test every year starting at age 46. Flexible sigmoidoscopy or colonoscopy. You may have a sigmoidoscopy every 5 years or a colonoscopy every 10 years starting at age 108. Prostate cancer screening. Recommendations will vary depending on your family history and other risks. Hepatitis C blood test. Hepatitis B blood test. Sexually transmitted disease (STD) testing. Diabetes screening. This is done by checking your blood sugar (glucose) after you have not eaten for a while (fasting). You may have this done every 1-3 years. Abdominal aortic aneurysm (AAA) screening. You may need this if you are a current or former smoker. Osteoporosis. You may be screened starting at age 41 if you are at high risk. Talk with your health care provider about your test results, treatment options, and if necessary, the need for more tests. Vaccines  Your health care provider may recommend certain vaccines, such as: Influenza vaccine. This is recommended every year. Tetanus, diphtheria, and acellular pertussis (Tdap, Td) vaccine. You may need a Td booster every 10 years. Zoster vaccine. You may need this after age 78. Pneumococcal 13-valent conjugate (PCV13) vaccine. One dose is recommended after age 25. Pneumococcal polysaccharide (PPSV23) vaccine. One dose is recommended after age 64. Talk  to your health care provider about which  screenings and vaccines you need and how often you need them. This information is not intended to replace advice given to you by your health care provider. Make sure you discuss any questions you have with your health care provider. Document Released: 01/23/2015 Document Revised: 09/16/2015 Document Reviewed: 10/28/2014 Elsevier Interactive Patient Education  2017 [place] Prevention in the Home Falls can cause injuries. They can happen to people of all ages. There are many things you can do to make your home safe and to help prevent falls. What can I do on the outside of my home? Regularly fix the edges of walkways and driveways and fix any cracks. Remove anything that might make you trip as you walk through a door, such as a raised step or threshold. Trim any bushes or trees on the path to your home. Use bright outdoor lighting. Clear any walking paths of anything that might make someone trip, such as rocks or tools. Regularly check to see if handrails are loose or broken. Make sure that both sides of any steps have handrails. Any raised decks and porches should have guardrails on the edges. Have any leaves, snow, or ice cleared regularly. Use sand or salt on walking paths during winter. Clean up any spills in your garage right away. This includes oil or grease spills. What can I do in the bathroom? Use night lights. Install grab bars by the toilet and in the tub and shower. Do not use towel bars as grab bars. Use non-skid mats or decals in the tub or shower. If you need to sit down in the shower, use a plastic, non-slip stool. Keep the floor dry. Clean up any water that spills on the floor as soon as it happens. Remove soap buildup in the tub or shower regularly. Attach bath mats securely with double-sided non-slip rug tape. Do not have throw rugs and other things on the floor that can make you trip. What can I do in the bedroom? Use night lights. Make sure that you have a  light by your bed that is easy to reach. Do not use any sheets or blankets that are too big for your bed. They should not hang down onto the floor. Have a firm chair that has side arms. You can use this for support while you get dressed. Do not have throw rugs and other things on the floor that can make you trip. What can I do in the kitchen? Clean up any spills right away. Avoid walking on wet floors. Keep items that you use a lot in easy-to-reach places. If you need to reach something above you, use a strong step stool that has a grab bar. Keep electrical cords out of the way. Do not use floor polish or wax that makes floors slippery. If you must use wax, use non-skid floor wax. Do not have throw rugs and other things on the floor that can make you trip. What can I do with my stairs? Do not leave any items on the stairs. Make sure that there are handrails on both sides of the stairs and use them. Fix handrails that are broken or loose. Make sure that handrails are as long as the stairways. Check any carpeting to make sure that it is firmly attached to the stairs. Fix any carpet that is loose or worn. Avoid having throw rugs at the top or bottom of the stairs. If you do have throw rugs,  attach them to the floor with carpet tape. Make sure that you have a light switch at the top of the stairs and the bottom of the stairs. If you do not have them, ask someone to add them for you. What else can I do to help prevent falls? Wear shoes that: Do not have high heels. Have rubber bottoms. Are comfortable and fit you well. Are closed at the toe. Do not wear sandals. If you use a stepladder: Make sure that it is fully opened. Do not climb a closed stepladder. Make sure that both sides of the stepladder are locked into place. Ask someone to hold it for you, if possible. Clearly mark and make sure that you can see: Any grab bars or handrails. First and last steps. Where the edge of each step  is. Use tools that help you move around (mobility aids) if they are needed. These include: Canes. Walkers. Scooters. Crutches. Turn on the lights when you go into a dark area. Replace any light bulbs as soon as they burn out. Set up your furniture so you have a clear path. Avoid moving your furniture around. If any of your floors are uneven, fix them. If there are any pets around you, be aware of where they are. Review your medicines with your doctor. Some medicines can make you feel dizzy. This can increase your chance of falling. Ask your doctor what other things that you can do to help prevent falls. This information is not intended to replace advice given to you by your health care provider. Make sure you discuss any questions you have with your health care provider. Document Released: 10/23/2008 Document Revised: 06/04/2015 Document Reviewed: 01/31/2014 Elsevier Interactive Patient Education  2017 [employer].

## 2021-08-18 ENCOUNTER — Emergency Department: Payer: PPO

## 2021-08-18 ENCOUNTER — Other Ambulatory Visit: Payer: Self-pay

## 2021-08-18 ENCOUNTER — Ambulatory Visit: Payer: Self-pay

## 2021-08-18 ENCOUNTER — Encounter: Payer: Self-pay | Admitting: Emergency Medicine

## 2021-08-18 ENCOUNTER — Emergency Department
Admission: EM | Admit: 2021-08-18 | Discharge: 2021-08-18 | Disposition: A | Discharge status: Home / Self Care | Payer: PPO | Attending: Student in an Organized Health Care Education/Training Program | Admitting: Student in an Organized Health Care Education/Training Program

## 2021-08-18 DIAGNOSIS — R531 Weakness: Secondary | ICD-10-CM | POA: Insufficient documentation

## 2021-08-18 DIAGNOSIS — R002 Palpitations: Secondary | ICD-10-CM | POA: Insufficient documentation

## 2021-08-18 DIAGNOSIS — Z951 Presence of aortocoronary bypass graft: Secondary | ICD-10-CM | POA: Diagnosis not present

## 2021-08-18 DIAGNOSIS — F0390 Unspecified dementia without behavioral disturbance: Secondary | ICD-10-CM | POA: Diagnosis not present

## 2021-08-18 DIAGNOSIS — I700 Atherosclerosis of aorta: Secondary | ICD-10-CM | POA: Diagnosis not present

## 2021-08-18 DIAGNOSIS — Z955 Presence of coronary angioplasty implant and graft: Secondary | ICD-10-CM | POA: Diagnosis not present

## 2021-08-18 LAB — CBC
HCT: 35.3 % — ABNORMAL LOW (ref 39.0–52.0)
Hemoglobin: 11.5 g/dL — ABNORMAL LOW (ref 13.0–17.0)
MCH: 27.4 pg (ref 26.0–34.0)
MCHC: 32.6 g/dL (ref 30.0–36.0)
MCV: 84 fL (ref 80.0–100.0)
Platelets: 243 10*3/uL (ref 150–400)
RBC: 4.2 MIL/uL — ABNORMAL LOW (ref 4.22–5.81)
RDW: 12.7 % (ref 11.5–15.5)
WBC: 5.9 10*3/uL (ref 4.0–10.5)
nRBC: 0 % (ref 0.0–0.2)

## 2021-08-18 LAB — BASIC METABOLIC PANEL
Anion gap: 6 (ref 5–15)
BUN: 22 mg/dL (ref 8–23)
CO2: 24 mmol/L (ref 22–32)
Calcium: 9.3 mg/dL (ref 8.9–10.3)
Chloride: 108 mmol/L (ref 98–111)
Creatinine, Ser: 1.5 mg/dL — ABNORMAL HIGH (ref 0.61–1.24)
GFR, Estimated: 45 mL/min — ABNORMAL LOW (ref >60)
Glucose, Bld: 141 mg/dL — ABNORMAL HIGH (ref 70–99)
Potassium: 4.3 mmol/L (ref 3.5–5.1)
Sodium: 138 mmol/L (ref 135–145)

## 2021-08-18 LAB — TROPONIN I (HIGH SENSITIVITY): Troponin I (High Sensitivity): 4 ng/L (ref <18)

## 2021-08-18 NOTE — ED Triage Notes (Signed)
Patient brought in by wife- states patient has been progressively becoming weaker over the past year. States patient has dementia and losing weight. Wife states patient complained of a heart flutter on Monday. Patient described it as a nerve shock radiating down his leg.

## 2021-08-18 NOTE — ED Provider Notes (Signed)
[facility] [place] Provider Note    Event Date/Time   First MD Initiated Contact with Patient 08/18/21 1500     (approximate)   History   Weakness and Palpitations   HPI  [name] is a 88 y.o. male with dementia presents to the ER for evaluation of little more than a year of progressively worsening weakness and poor p.o. intake.  Wife brought him in to the ER at the PCPs recommendation after she noticed over the past few weeks that it was getting worse.  She is not interested in placement would like to keep him home at all at all possible.  No specific complaints of any nausea or vomiting no abdominal pain.  No falls no headaches no chest pain no fevers no dysuria.  No diarrhea.  States that he simply has lost his appetite and is not eating as much is he used to.  They do have palliative care involved.     Physical Exam   Triage Vital Signs: ED Triage Vitals  Enc Vitals Group     BP 08/18/21 1230 107/63     Pulse Rate 08/18/21 1230 73     Resp 08/18/21 1230 17     Temp 08/18/21 1230 98 F (36.7 C)     Temp Source 08/18/21 1230 Oral     SpO2 08/18/21 1230 99 %     Weight --      Height --      Head Circumference --      Peak Flow --      Pain Score 08/18/21 1237 0     Pain Loc --      Pain Edu? --      Excl. in [place]? --     Most recent vital signs: Vitals:   08/18/21 1230 08/18/21 1451  BP: 107/63 (!) 174/80  Pulse: 73 63  Resp: 17 18  Temp: 98 F (36.7 C)   SpO2: 99% 99%     Constitutional: Alert, pleasant, chronically ill appearing Eyes: Conjunctivae are normal.  Head: Atraumatic. Nose: No congestion/rhinnorhea. Mouth/Throat: Mucous membranes are moist.   Neck: Painless ROM.  Cardiovascular:   Good peripheral circulation. Respiratory: Normal respiratory effort.  No retractions.  Gastrointestinal: Soft and nontender.  Musculoskeletal:  no deformity Neurologic:  MAE spontaneously. No gross focal neurologic deficits are appreciated.   Skin:  Skin is warm, dry and intact. No rash noted. Psychiatric: Mood and affect are normal. Speech and behavior are normal.    ED Results / Procedures / Treatments   Labs (all labs ordered are listed, but only abnormal results are displayed) Labs Reviewed  BASIC METABOLIC PANEL - Abnormal; Notable for the following components:      Result Value   Glucose, Bld 141 (*)    Creatinine, Ser 1.50 (*)    GFR, Estimated 45 (*)    All other components within normal limits  CBC - Abnormal; Notable for the following components:   RBC 4.20 (*)    Hemoglobin 11.5 (*)    HCT 35.3 (*)    All other components within normal limits  TROPONIN I (HIGH SENSITIVITY)  TROPONIN I (HIGH SENSITIVITY)     EKG  ED ECG REPORT I, Merlyn Lot, the attending physician, personally viewed and interpreted this ECG.   Date: 08/18/2021  EKG Time: 12:27  Rate: 74  Rhythm: sinus  Axis: normal  Intervals:normal  ST&T Change: nonspecific st and t wave abn    RADIOLOGY Please see  ED Course for my review and interpretation.  I personally reviewed all radiographic images ordered to evaluate for the above acute complaints and reviewed radiology reports and findings.  These findings were personally discussed with the patient.  Please see medical record for radiology report.    PROCEDURES:  Critical Care performed: No  Procedures   MEDICATIONS ORDERED IN ED: Medications - No data to display   IMPRESSION / MDM / [place] / ED COURSE  I reviewed the triage vital signs and the nursing notes.                              Differential diagnosis includes, but is not limited to, deconditioning, dehydration, electrolyte abnormality, CVA, mass, muscle wasting, dementia, UTI, infection  Patient presented to the ER for evaluation of symptoms as described above.  Patient elderly frail-appearing with symptoms ongoing for the past several months to even a year.  Blood work is reassuring at  his baseline.  Discussed my recommendation for neuroimaging given his age and risk factors.  Patient and wife states that though will decline this at this time since I think is most likely secondary to his poor p.o. intake and deconditioning.  Would like to speak with her PCP about additional imaging and possible home health.  This point he believes he is appropriate for outpatient follow-up given their goals of care.     FINAL CLINICAL IMPRESSION(S) / ED DIAGNOSES   Final diagnoses:  Weakness     Rx / DC Orders   ED Discharge Orders     None        Note:  This document was prepared using Dragon voice recognition software and may include unintentional dictation errors.    Merlyn Lot, MD 08/18/21 1544

## 2021-08-18 NOTE — Telephone Encounter (Signed)
  Chief Complaint: severe lower legs weakness Symptoms: no other sx Frequency: last week weakness began now unable to walk Pertinent Negatives: Patient denies chest pain, numbness or tingling to face arms or legs Disposition: '[x]'$ ED /'[]'$ Urgent Care (no appt availability in office) / '[]'$ Appointment(In office/virtual)/ '[]'$  [place] [facility]/ '[]'$ Home Care/ '[]'$ Refused Recommended Disposition /'[]'$ [facility] [facility]/ '[]'$  Follow-up with PCP Additional Notes: Advised to call 911.  Reason for Disposition  [1] SEVERE weakness (i.e., unable to walk or barely able to walk, requires support) AND [2] new-onset or worsening  Answer Assessment - Initial Assessment Questions 1. DESCRIPTION: "Describe how you are feeling."     Leg weakness 2. SEVERITY: "How bad is it?"  "Can you stand and walk?"   - MILD (0-3): Feels weak or tired, but does not interfere with work, school or normal activities.   - MODERATE (4-7): Able to stand and walk; weakness interferes with work, school, or normal activities.   - SEVERE (8-10): Unable to stand or walk; unable to do usual activities.     severe 3. ONSET: "When did these symptoms begin?" (e.g., hours, days, weeks, months)     Last week- worse today  4. CAUSE: "What do you think is causing the weakness or fatigue?" (e.g., not drinking enough fluids, medical problem, trouble sleeping)     Heart  5. NEW MEDICINES:  "Have you started on any new medicines recently?" (e.g., opioid pain medicines, benzodiazepines, muscle relaxants, antidepressants, antihistamines, neuroleptics, beta blockers)     no 6. OTHER SYMPTOMS: "Do you have any other symptoms?" (e.g., chest pain, fever, cough, SOB, vomiting, diarrhea, bleeding, other areas of pain)     Weak in legs,  7. PREGNANCY: "Is there any chance you are pregnant?" "When was your last menstrual period?"     N/a  Protocols used: Weakness (Generalized) and Fatigue-A-AH

## 2021-08-18 NOTE — ED Notes (Signed)
Dc ppw provided to patient and family. Pt provides verbal consent for dc. Assisted to lobby in wheelchair.

## 2021-09-24 ENCOUNTER — Other Ambulatory Visit: Payer: Self-pay | Admitting: Cardiovascular Disease

## 2021-09-27 NOTE — Progress Notes (Unsigned)
Date:  09/28/2021   ID:  [name], DOB [birth year], MRN [id number]  Patient Location:  [address] [place] [address]   Provider location:   [facility], [place] office  PCP:  [name], DO  Cardiologist:  Patsy Baltimore   Chief Complaint  Patient presents with   12 month follow up     "Doing well." Medications reviewed by the patient verbally.     History of Present Illness:    [name] is a 88 y.o. male past medical history of coronary artery disease, bypass surgery may 2014  atrial fibrillation, previously on amiodarone, s/p maze stent placed to his LAD in 1993, stent placed to his mid RCA in 2003,  in-stent restenosis with repeat stent placed to his LAD in September 08,  stent placed to his RCA at the same time,  stress test in February 2009 and Jan 2014 which showed no ischemia,   low testosterone  Constipation, chronic Back pain He presents for routine followup of his coronary artery disease, paroxysmal atrial fibrillation  Last seen by myself in clinic June 2022  Wife presents with him on todays visit Trouble with swallowing,epiglottis issue, will choke Weight down from 159 2 years ago, 147 last year, now 133   Blood pressure running low In the ER 08/18/2021 weakness and palpitations CR 1.5  Wife reports she is trying to keep him hydrated Remains on amlodipine 5 Reports having some memory issues  Lab work reviewed Total chol 151  LDL 89 HBA1C 5.7   Denies chest pain/angina, no tachycardia concerning for arrhythmia  EKG personally reviewed by myself on todays visit Shows normal sinus rhythm rate 63  bpm no significant ST-T wave changes  Other past medical history reviewed Previous episode of dizziness in 2015 dizziness. He was unable to get out of bed without assistance. Dizziness occurred while supine as well as sitting and standing. He went to the emergency room, was told that he could be dehydrated and  was given IV fluids x2 bags. This seemed to improve his symptoms and was discharged home. Other workup in the hospital was negative including negative cardiac enzymes, normal basic metabolic panel, normal LFTs, normal EKG   Previously had chest pain in may 2014. Presented to [facility], cardiac catheterization showing subtotal occlusion of his LAD stent, in-stent restenosis, severe OM1 and OM 2 disease also with RCA disease,  On  05/18/2012.  He underwent CABG x 4 utilizing LIMA to LAD, SVG to OM1, SVG to OM2, and SVG to PDA.  He also underwent Complete MAZE procedure with Clipping of Left Atrial Appendage.     He did not tolerate beta blockers or ACE inhibitor he had malaise, bradycardia. He took himself off these medications.    In  January 2014 he  had an episode of atrial fibrillation and presented to the emergency room. Converted back to normal sinus rhythm. Discharged on pradaxa. Recurrent episodes of atrial fibrillation shortly after that.     Past Medical History:  Diagnosis Date   Arthritis    Coronary artery disease    a. 1993 s/p PCI/BMS LAD;  b. 2003 PCI RCA;  c. 09/2006 ISR LAD->PCI, PCI RCA;  d. 02/2007 MV: no ischemia;  d. 01/2012 MV: EF 74%, no ischemia.   History of prostate cancer    Hyperlipidemia    Hyperthyroidism    Low testosterone    PAF (paroxysmal atrial fibrillation) ([place])    a.  previously on amio ->stopped in 2013;  b. recurrent PAF 01/2012->Pradaxa and amio initiated, subsequently came off of pradaxa 2/2 cost;  c. 02/2012 Echo: EF 55-60%, Gr 1 DD.   Prostate cancer ([place])    S/P CABG x 4    Wears dentures    partial upper and lower   Past Surgical History:  Procedure Laterality Date   APPENDECTOMY     Dr. Bary Castilla   CARDIAC CATHETERIZATION  2008   CATARACT EXTRACTION W/PHACO Left 07/07/2021   Procedure: CATARACT EXTRACTION PHACO AND INTRAOCULAR LENS PLACEMENT ([place]) LEFT;  Surgeon: Leandrew Koyanagi, MD;  Location: [place];  Service: Ophthalmology;   Laterality: Left;  9.52 1:16.2   CATARACT EXTRACTION W/PHACO Right 07/28/2021   Procedure: CATARACT EXTRACTION PHACO AND INTRAOCULAR LENS PLACEMENT ([place]) RIGHT  KAHOOK DUAL BLADE GONIOTOMY 7.63 01:01.0;  Surgeon: Leandrew Koyanagi, MD;  Location: [place];  Service: Ophthalmology;  Laterality: Right;   CLIPPING OF ATRIAL APPENDAGE Left 05/18/2012   Procedure: CLIPPING OF ATRIAL APPENDAGE;  Surgeon: [name], MD;  Location: [place];  Service: Open Heart Surgery;  Laterality: Left;   COLONOSCOPY  2013   COLONOSCOPY WITH PROPOFOL N/A 07/12/2016   Procedure: COLONOSCOPY WITH PROPOFOL;  Surgeon: [name], MD;  [facility] ENDOSCOPY;  Service: Endoscopy;  Laterality: N/A;   CORONARY ARTERY BYPASS GRAFT N/A 05/18/2012   Procedure: CORONARY ARTERY BYPASS GRAFTING (CABG);  Surgeon: [name], MD;  Location: [place];  Service: Open Heart Surgery;  Laterality: N/A;  Times 4 using left internal mammary artery and endoscopically harvested right saphenous vein   ESOPHAGOGASTRODUODENOSCOPY (EGD) WITH PROPOFOL N/A 10/24/2017   Procedure: ESOPHAGOGASTRODUODENOSCOPY (EGD) WITH PROPOFOL;  Surgeon: [name], MD;  Location: [facility] ENDOSCOPY;  Service: Endoscopy;  Laterality: N/A;   HERNIA REPAIR     s/p mesh bilaterally, Dr. Bary Castilla   INTRAOPERATIVE TRANSESOPHAGEAL ECHOCARDIOGRAM N/A 05/18/2012   Procedure: INTRAOPERATIVE TRANSESOPHAGEAL ECHOCARDIOGRAM;  Surgeon: [name], MD;  Location: [place];  Service: Open Heart Surgery;  Laterality: N/A;   IR ESOPHAGUS DILITATION RETRO FLUORO     LEFT HEART CATHETERIZATION WITH CORONARY ANGIOGRAM Bilateral 05/16/2012   Procedure: LEFT HEART CATHETERIZATION WITH CORONARY ANGIOGRAM;  Surgeon: Burnell Blanks, MD;  Location: [facility] CATH LAB;  Service: Cardiovascular;  Laterality: Bilateral;   MAZE N/A 05/18/2012   Procedure: MAZE;  Surgeon: [name], MD;  Location: [place];  Service: Open Heart Surgery;  Laterality: N/A;   MOHS SURGERY      PROSTATECTOMY     TOTAL HIP ARTHROPLASTY Right    Dr. [name]     Allergies:   Ambien [zolpidem tartrate], Pravastatin sodium, Rosuvastatin calcium, and Zolpidem tartrate   Social History   Tobacco Use   Smoking status: Former    Types: Cigars    Quit date: 10/04/1988    Years since quitting: 33.0   Smokeless tobacco: Former    Types: Chew    Quit date: 09/04/1991  Vaping Use   Vaping Use: Never used  Substance Use Topics   Alcohol use: Yes    Comment: 1/3 cup of red wine occassionally   Drug use: No     Current Outpatient Medications on File Prior to Visit  Medication Sig Dispense Refill   amLODipine (NORVASC) 5 MG tablet Take 1 tablet (5 mg total) by mouth daily. 90 tablet 3   bisacodyl (DULCOLAX) 10 MG suppository Place 10 mg rectally daily as needed for moderate constipation.     celecoxib (CELEBREX) 100 MG  capsule TAKE ONE CAPSULE BY MOUTH TWICE A DAY 60 capsule 2   Cholecalciferol (VITAMIN D3) 1.25 MG (50000 UT) CAPS Take 5,000 Units by mouth daily.     dorzolamide-timolol (COSOPT) 22.3-6.8 MG/ML ophthalmic solution 1 drop 2 (two) times daily.     ezetimibe (ZETIA) 10 MG tablet Take 10 mg by mouth daily.     FERROUS SULFATE PO Take by mouth once a week.     fluticasone (FLONASE) 50 MCG/ACT nasal spray SPRAY TWO SPRAYS IN EACH NOSTRIL ONCE DAILY - USE FOR 4-6 WEEKS THEN STOP AND USE SEASONALLY OR AS NEEDED 16 mL 1   latanoprost (XALATAN) 0.005 % ophthalmic solution 0.005 drops daily.     polyethylene glycol powder (GLYCOLAX/MIRALAX) 17 GM/SCOOP powder Take 17-34 g by mouth daily. For constipation 765 g 5   vitamin C (ASCORBIC ACID) 250 MG tablet Take 250 mg by mouth daily.     baclofen (LIORESAL) 10 MG tablet  (Patient not taking: Reported on 08/13/2021)     Influenza Vac High-Dose Quad (FLUZONE HIGH-DOSE QUADRIVALENT) 0.7 ML SUSY  (Patient not taking: Reported on 09/28/2021)     Influenza vac split quadrivalent PF (FLUZONE HIGH-DOSE) 0.5 ML injection  (Patient not taking:  Reported on 09/28/2021)     ipratropium (ATROVENT) 0.06 % nasal spray  (Patient not taking: Reported on 08/13/2021)     meloxicam (MOBIC) 15 MG tablet Take 1 tablet every day by oral route. (Patient not taking: Reported on 08/13/2021)     meloxicam (MOBIC) 7.5 MG tablet Take 1 tablet every day by oral route. (Patient not taking: Reported on 08/13/2021)     pantoprazole (PROTONIX) 40 MG tablet  (Patient not taking: Reported on 08/13/2021)     SENNA PO Take by mouth daily. (Patient not taking: Reported on 09/28/2021)     No current facility-administered medications on file prior to visit.     Family Hx: The patient's family history includes Coronary artery disease in his mother and sister; Diabetes in his father and sister; Heart disease in his brother and mother; Heart disease (age of onset: 59) in his sister.  ROS:   Please see the history of present illness.    Review of Systems  Constitutional: Negative.   HENT: Negative.    Respiratory: Negative.    Cardiovascular: Negative.   Gastrointestinal: Negative.   Musculoskeletal: Negative.   Neurological: Negative.   Psychiatric/Behavioral: Negative.    All other systems reviewed and are negative.    Labs/Other Tests and Data Reviewed:    Recent Labs: 01/07/2021: TSH 1.82 07/09/2021: ALT 12 08/18/2021: BUN 22; Creatinine, Ser 1.50; Hemoglobin 11.5; Platelets 243; Potassium 4.3; Sodium 138   Recent Lipid Panel Lab Results  Component Value Date/Time   CHOL 151 01/07/2021 09:59 AM   CHOL 138 05/23/2016 10:01 AM   CHOL 123 05/14/2012 12:28 AM   TRIG 73 01/07/2021 09:59 AM   TRIG 53 05/14/2012 12:28 AM   HDL 46 01/07/2021 09:59 AM   HDL 38 (L) 05/23/2016 10:01 AM   HDL 44 05/14/2012 12:28 AM   CHOLHDL 3.3 01/07/2021 09:59 AM   LDLCALC 89 01/07/2021 09:59 AM   LDLCALC 68 05/14/2012 12:28 AM    Wt Readings from Last 3 Encounters:  09/28/21 133 lb 6 oz (60.5 kg)  08/13/21 133 lb (60.3 kg)  07/28/21 133 lb (60.3 kg)     Exam:    Vital  Signs: Vital signs may also be detailed in the HPI BP (!) 110/54 (BP Location: Left Arm,  Patient Position: Sitting, Cuff Size: Normal)   Pulse 63   Ht '6\' 1"'$  (1.854 m)   Wt 133 lb 6 oz (60.5 kg)   SpO2 97%   BMI 17.60 kg/m   Constitutional:  oriented to person, place, and time. No distress.  HENT:  Head: Grossly normal Eyes:  no discharge. No scleral icterus.  Neck: No JVD, no carotid bruits  Cardiovascular: Regular rate and rhythm, no murmurs appreciated Pulmonary/Chest: Clear to auscultation bilaterally, no wheezes or rails Abdominal: Soft.  no distension.  no tenderness.  Musculoskeletal: Normal range of motion Neurological:  normal muscle tone. Coordination normal. No atrophy Skin: Skin warm and dry Psychiatric: normal affect, pleasant   ASSESSMENT & PLAN:    Problem List Items Addressed This Visit       Cardiology Problems   Paroxysmal atrial fibrillation (HCC)   Relevant Medications   ezetimibe (ZETIA) 10 MG tablet   Other Relevant Orders   EKG 12-Lead   Hyperlipidemia   Relevant Medications   ezetimibe (ZETIA) 10 MG tablet   AAA (abdominal aortic aneurysm) without rupture (HCC)   Relevant Medications   ezetimibe (ZETIA) 10 MG tablet   Other Visit Diagnoses     Atherosclerosis of native coronary artery of native heart with stable angina pectoris ([place])    -  Primary   Relevant Medications   ezetimibe (ZETIA) 10 MG tablet   Other Relevant Orders   EKG 12-Lead   Aortic atherosclerosis (HCC)       Relevant Medications   ezetimibe (ZETIA) 10 MG tablet   HYPERTENSION, BENIGN       Relevant Medications   ezetimibe (ZETIA) 10 MG tablet   Other Relevant Orders   EKG 12-Lead   S/P CABG x 4         CAD with stable angina Currently with no symptoms of angina. No further workup at this time. Continue current medication regimen.  Atrial fibrillation S/p maze Maintaining normal sinus rhythm, not on rate or rhythm control medication Off  anticoagulation  Hypertension Blood pressure running low likely secondary to 20 pound weight loss in the past 2 years Recommend he stop amlodipine Recently in the emergency room with low blood pressure, weakness  Constipation, chronic Regular laxatives needed Recommend increase fluids  Hyperlipidemia On Zetia, statin  Acute renal failure Likely secondary to prerenal state, recommend he increase his hydration   Total encounter time more than 30 minutes  Greater than 50% was spent in counseling and coordination of care with the patient    Signed, Ida Rogue, [place] [id number], [place], [place] [address]

## 2021-09-28 ENCOUNTER — Ambulatory Visit: Payer: PPO | Attending: Cardiovascular Disease | Admitting: Cardiovascular Disease

## 2021-09-28 ENCOUNTER — Other Ambulatory Visit: Payer: Self-pay | Admitting: Cardiovascular Disease

## 2021-09-28 ENCOUNTER — Encounter: Payer: Self-pay | Admitting: Cardiovascular Disease

## 2021-09-28 VITALS — BP 110/54 | HR 63 | Ht 73.0 in | Wt 133.4 lb

## 2021-09-28 DIAGNOSIS — I700 Atherosclerosis of aorta: Secondary | ICD-10-CM | POA: Diagnosis not present

## 2021-09-28 DIAGNOSIS — I25118 Atherosclerotic heart disease of native coronary artery with other forms of angina pectoris: Secondary | ICD-10-CM

## 2021-09-28 DIAGNOSIS — Z951 Presence of aortocoronary bypass graft: Secondary | ICD-10-CM | POA: Diagnosis not present

## 2021-09-28 DIAGNOSIS — I10 Essential (primary) hypertension: Secondary | ICD-10-CM

## 2021-09-28 DIAGNOSIS — E782 Mixed hyperlipidemia: Secondary | ICD-10-CM | POA: Diagnosis not present

## 2021-09-28 DIAGNOSIS — I714 Abdominal aortic aneurysm, without rupture, unspecified: Secondary | ICD-10-CM

## 2021-09-28 DIAGNOSIS — I480 Paroxysmal atrial fibrillation: Secondary | ICD-10-CM | POA: Diagnosis not present

## 2021-09-28 NOTE — Patient Instructions (Signed)
Medication Instructions:  Stop the amlodipine, blood pressure low  If you need a refill on your cardiac medications before your next appointment, please call your pharmacy.   Lab work: No new labs needed  Testing/Procedures: No new testing needed  Follow-Up: At [facility], you and your health needs are our priority.  As part of our continuing mission to provide you with exceptional heart care, we have created designated Provider Care Teams.  These Care Teams include your primary Cardiologist (physician) and Advanced Practice Providers (APPs -  Physician Assistants and Nurse Practitioners) who all work together to provide you with the care you need, when you need it.  You will need a follow up appointment in 12 months  Providers on your designated Care Team:   Murray Hodgkins, NP Christell Faith, PA-C Cadence Kathlen Mody, [place]  COVID-19 Vaccine Information can be found at: [email or web address] For questions related to vaccine distribution or appointments, please email [email or web address] or call [phone].

## 2021-09-29 ENCOUNTER — Encounter: Payer: Self-pay | Admitting: Urology

## 2021-11-04 ENCOUNTER — Other Ambulatory Visit: Payer: Self-pay

## 2021-11-04 ENCOUNTER — Emergency Department
Admission: EM | Admit: 2021-11-04 | Discharge: 2021-11-04 | Disposition: A | Discharge status: Home / Self Care | Payer: PPO | Attending: Emergency Medicine | Admitting: Emergency Medicine

## 2021-11-04 ENCOUNTER — Encounter: Payer: Self-pay | Admitting: Emergency Medicine

## 2021-11-04 ENCOUNTER — Emergency Department: Payer: PPO

## 2021-11-04 DIAGNOSIS — W19XXXA Unspecified fall, initial encounter: Secondary | ICD-10-CM | POA: Diagnosis not present

## 2021-11-04 DIAGNOSIS — F0390 Unspecified dementia without behavioral disturbance: Secondary | ICD-10-CM | POA: Insufficient documentation

## 2021-11-04 DIAGNOSIS — N189 Chronic kidney disease, unspecified: Secondary | ICD-10-CM | POA: Diagnosis not present

## 2021-11-04 DIAGNOSIS — R531 Weakness: Secondary | ICD-10-CM | POA: Diagnosis not present

## 2021-11-04 DIAGNOSIS — R29898 Other symptoms and signs involving the musculoskeletal system: Secondary | ICD-10-CM

## 2021-11-04 LAB — URINALYSIS, ROUTINE W REFLEX MICROSCOPIC
Bilirubin Urine: NEGATIVE
Glucose, UA: NEGATIVE mg/dL
Hgb urine dipstick: NEGATIVE
Ketones, ur: NEGATIVE mg/dL
Leukocytes,Ua: NEGATIVE
Nitrite: NEGATIVE
Protein, ur: NEGATIVE mg/dL
Specific Gravity, Urine: 1.013 (ref 1.005–1.030)
pH: 6 (ref 5.0–8.0)

## 2021-11-04 LAB — BASIC METABOLIC PANEL
Anion gap: 6 (ref 5–15)
BUN: 26 mg/dL — ABNORMAL HIGH (ref 8–23)
CO2: 24 mmol/L (ref 22–32)
Calcium: 9.2 mg/dL (ref 8.9–10.3)
Chloride: 107 mmol/L (ref 98–111)
Creatinine, Ser: 1.57 mg/dL — ABNORMAL HIGH (ref 0.61–1.24)
GFR, Estimated: 43 mL/min — ABNORMAL LOW (ref >60)
Glucose, Bld: 121 mg/dL — ABNORMAL HIGH (ref 70–99)
Potassium: 3.9 mmol/L (ref 3.5–5.1)
Sodium: 137 mmol/L (ref 135–145)

## 2021-11-04 LAB — CBC
HCT: 32.8 % — ABNORMAL LOW (ref 39.0–52.0)
Hemoglobin: 10.5 g/dL — ABNORMAL LOW (ref 13.0–17.0)
MCH: 27.4 pg (ref 26.0–34.0)
MCHC: 32 g/dL (ref 30.0–36.0)
MCV: 85.6 fL (ref 80.0–100.0)
Platelets: 252 10*3/uL (ref 150–400)
RBC: 3.83 MIL/uL — ABNORMAL LOW (ref 4.22–5.81)
RDW: 13.1 % (ref 11.5–15.5)
WBC: 8 10*3/uL (ref 4.0–10.5)
nRBC: 0 % (ref 0.0–0.2)

## 2021-11-04 MED ORDER — SODIUM CHLORIDE 0.9 % IV BOLUS
1000.0000 mL | Freq: Once | INTRAVENOUS | Status: AC
Start: 2021-11-04 — End: 2021-11-04
  Administered 2021-11-04: 1000 mL via INTRAVENOUS

## 2021-11-04 NOTE — Discharge Instructions (Addendum)
Please follow-up with Dr. Grayland Ormond, listed below, for arrangement for iron infusions rather than by mouth.

## 2021-11-04 NOTE — ED Notes (Signed)
Pt Dc to home. Dc instructions reviewed with all questions answered. Pt and support person verbalize understanding. IV removed, cath intact, pressure dressing applied. No bleeding noted at site. Pt assisted out of dept via wheelchair

## 2021-11-04 NOTE — ED Provider Notes (Signed)
[facility] Provider Note   Event Date/Time   First MD Initiated Contact with Patient 11/04/21 1832     (approximate) History  Weakness and Fall  HPI [name] is a 88 y.o. male with a stated past medical history of dementia and chronic bilateral lower extremity weakness as well as chronic kidney disease with anemia of chronic disease who presents for bilateral lower extremity weakness that resulted in a fall from standing today as he was just standing at the kitchen counter and felt his legs getting weak.  Patient states that his weakness has improved throughout his emergency department course and during the interview asked if he could stand and walk which he did without difficulty. ROS: Patient currently denies any vision changes, tinnitus, difficulty speaking, facial droop, sore throat, chest pain, shortness of breath, abdominal pain, nausea/vomiting/diarrhea, dysuria, or numbness/paresthesias in any extremity   Physical Exam  Triage Vital Signs: ED Triage Vitals  Enc Vitals Group     BP 11/04/21 1747 133/66     Pulse Rate 11/04/21 1747 66     Resp 11/04/21 1747 16     Temp 11/04/21 1747 98.9 F (37.2 C)     Temp Source 11/04/21 1747 Oral     SpO2 11/04/21 1747 99 %     Weight --      Height --      Head Circumference --      Peak Flow --      Pain Score 11/04/21 1746 0     Pain Loc --      Pain Edu? --      Excl. in [place]? --    Most recent vital signs: Vitals:   11/04/21 2130 11/04/21 2232  BP: (!) 196/85 (!) 155/64  Pulse: 61 88  Resp: 16 18  Temp:  97.9 F (36.6 C)  SpO2: 100% 99%   General: Awake, oriented x4. CV:  Good peripheral perfusion.  Resp:  Normal effort.  Abd:  No distention.  Other:  Elderly cachectic Caucasian male laying in bed in no acute distress.  Patient ambulatory with slow stuttering steps which is baseline according to wife at bedside ED Results / Procedures / Treatments  Labs (all labs ordered are listed, but  only abnormal results are displayed) Labs Reviewed  BASIC METABOLIC PANEL - Abnormal; Notable for the following components:      Result Value   Glucose, Bld 121 (*)    BUN 26 (*)    Creatinine, Ser 1.57 (*)    GFR, Estimated 43 (*)    All other components within normal limits  CBC - Abnormal; Notable for the following components:   RBC 3.83 (*)    Hemoglobin 10.5 (*)    HCT 32.8 (*)    All other components within normal limits  URINALYSIS, ROUTINE W REFLEX MICROSCOPIC - Abnormal; Notable for the following components:   Color, Urine YELLOW (*)    APPearance CLEAR (*)    All other components within normal limits  RADIOLOGY ED MD interpretation: CT of the head without contrast interpreted by me shows no evidence of acute abnormalities including no intracerebral hemorrhage, obvious masses, or significant edema -Agree with radiology assessment Official radiology report(s): CT Head Wo Contrast  Result Date: 11/04/2021 CLINICAL DATA:  Weakness with recent history of falls, initial encounter EXAM: CT HEAD WITHOUT CONTRAST TECHNIQUE: Contiguous axial images were obtained from the base of the skull through the vertex without intravenous contrast. RADIATION DOSE REDUCTION: This  exam was performed according to the departmental dose-optimization program which includes automated exposure control, adjustment of the mA and/or kV according to patient size and/or use of iterative reconstruction technique. COMPARISON:  12/30/2018 FINDINGS: Brain: No evidence of acute infarction, hemorrhage, hydrocephalus, extra-axial collection or mass lesion/mass effect. Chronic atrophic and ischemic changes are noted. Vascular: No hyperdense vessel or unexpected calcification. Skull: Normal. Negative for fracture or focal lesion. Sinuses/Orbits: No acute finding. Other: None. IMPRESSION: Chronic atrophic and ischemic changes. No acute intracranial abnormality noted. Electronically Signed   By: Inez Catalina M.D.   On:  11/04/2021 19:08   PROCEDURES: Critical Care performed: No Procedures MEDICATIONS ORDERED IN ED: Medications  sodium chloride 0.9 % bolus 1,000 mL (0 mLs Intravenous Stopped 11/04/21 2232)   IMPRESSION / MDM / ASSESSMENT AND PLAN / ED COURSE  I reviewed the triage vital signs and the nursing notes.                             The patient is on the cardiac monitor to evaluate for evidence of arrhythmia and/or significant heart rate changes. Patient's presentation is most consistent with acute presentation with potential threat to life or bodily function. This patient presents with generalized weakness and fatigue likely secondary to dehydration. Considered alternate etiologies of the patients symptoms including infectious processes, severe metabolic derangements or electrolyte abnormalities, ischemia/ACS, heart failure, and intracranial/central processes but think these are unlikely given the history and physical exam.  Plan: labs, 1L fluid resuscitation, pain/nausea control, reassessment   FINAL CLINICAL IMPRESSION(S) / ED DIAGNOSES   Final diagnoses:  Weakness of both lower extremities   Rx / DC Orders   ED Discharge Orders     None      Note:  This document was prepared using Dragon voice recognition software and may include unintentional dictation errors.   Naaman Plummer, MD 11/04/21 [phone]

## 2021-11-04 NOTE — ED Notes (Signed)
First nurse note-pt brought in via ems from home with weakness.  Fell twice today at home.  No known injury.  Bp 148/68, p64, oxygen sats 100%, fsbs 161 per ems.  Pt alert in wheelchair.

## 2021-11-04 NOTE — ED Notes (Signed)
Pt states he is concerned about being able to get in and out of bed, and being able to move around at home in general due to the weakness in his legs. Pt has been feeling the weakness in his legs for a while, but the episode of falling today is new.

## 2021-11-04 NOTE — ED Triage Notes (Signed)
Pt states he has had increasing weakness for some time and now has fallen today as he feels he has no strength in his legs.  No injury. No LOC

## 2021-11-25 DIAGNOSIS — H401123 Primary open-angle glaucoma, left eye, severe stage: Secondary | ICD-10-CM | POA: Diagnosis not present

## 2021-11-26 DIAGNOSIS — M7061 Trochanteric bursitis, right hip: Secondary | ICD-10-CM | POA: Diagnosis not present

## 2021-12-01 ENCOUNTER — Other Ambulatory Visit: Payer: PPO

## 2021-12-03 ENCOUNTER — Other Ambulatory Visit: Payer: PPO

## 2022-01-09 ENCOUNTER — Encounter: Payer: Self-pay | Admitting: Family Medicine

## 2022-01-11 ENCOUNTER — Other Ambulatory Visit: Payer: Self-pay

## 2022-01-11 DIAGNOSIS — I129 Hypertensive chronic kidney disease with stage 1 through stage 4 chronic kidney disease, or unspecified chronic kidney disease: Secondary | ICD-10-CM

## 2022-01-11 DIAGNOSIS — D649 Anemia, unspecified: Secondary | ICD-10-CM

## 2022-01-11 DIAGNOSIS — E782 Mixed hyperlipidemia: Secondary | ICD-10-CM

## 2022-01-11 DIAGNOSIS — E559 Vitamin D deficiency, unspecified: Secondary | ICD-10-CM

## 2022-01-11 DIAGNOSIS — F02A0 Dementia in other diseases classified elsewhere, mild, without behavioral disturbance, psychotic disturbance, mood disturbance, and anxiety: Secondary | ICD-10-CM

## 2022-01-11 DIAGNOSIS — Z0000 Encounter for general adult medical examination without abnormal findings: Secondary | ICD-10-CM

## 2022-01-11 DIAGNOSIS — R7303 Prediabetes: Secondary | ICD-10-CM

## 2022-01-11 DIAGNOSIS — E538 Deficiency of other specified B group vitamins: Secondary | ICD-10-CM

## 2022-01-12 ENCOUNTER — Other Ambulatory Visit: Payer: PPO

## 2022-01-12 DIAGNOSIS — R7303 Prediabetes: Secondary | ICD-10-CM | POA: Diagnosis not present

## 2022-01-12 DIAGNOSIS — D649 Anemia, unspecified: Secondary | ICD-10-CM | POA: Diagnosis not present

## 2022-01-12 DIAGNOSIS — G309 Alzheimer's disease, unspecified: Secondary | ICD-10-CM | POA: Diagnosis not present

## 2022-01-12 DIAGNOSIS — N183 Chronic kidney disease, stage 3 unspecified: Secondary | ICD-10-CM | POA: Diagnosis not present

## 2022-01-12 DIAGNOSIS — Z0000 Encounter for general adult medical examination without abnormal findings: Secondary | ICD-10-CM | POA: Diagnosis not present

## 2022-01-12 DIAGNOSIS — E559 Vitamin D deficiency, unspecified: Secondary | ICD-10-CM | POA: Diagnosis not present

## 2022-01-12 DIAGNOSIS — E782 Mixed hyperlipidemia: Secondary | ICD-10-CM | POA: Diagnosis not present

## 2022-01-12 DIAGNOSIS — F02A0 Dementia in other diseases classified elsewhere, mild, without behavioral disturbance, psychotic disturbance, mood disturbance, and anxiety: Secondary | ICD-10-CM | POA: Diagnosis not present

## 2022-01-12 DIAGNOSIS — E538 Deficiency of other specified B group vitamins: Secondary | ICD-10-CM | POA: Diagnosis not present

## 2022-01-12 DIAGNOSIS — I129 Hypertensive chronic kidney disease with stage 1 through stage 4 chronic kidney disease, or unspecified chronic kidney disease: Secondary | ICD-10-CM | POA: Diagnosis not present

## 2022-01-13 LAB — COMPLETE METABOLIC PANEL WITH GFR
AG Ratio: 1.8 (calc) (ref 1.0–2.5)
ALT: 18 U/L (ref 9–46)
AST: 20 U/L (ref 10–35)
Albumin: 4.2 g/dL (ref 3.6–5.1)
Alkaline phosphatase (APISO): 78 U/L (ref 35–144)
BUN/Creatinine Ratio: 19 (calc) (ref 6–22)
BUN: 24 mg/dL (ref 7–25)
CO2: 27 mmol/L (ref 20–32)
Calcium: 9.4 mg/dL (ref 8.6–10.3)
Chloride: 105 mmol/L (ref 98–110)
Creat: 1.27 mg/dL — ABNORMAL HIGH (ref 0.70–1.22)
Globulin: 2.4 g/dL (calc) (ref 1.9–3.7)
Glucose, Bld: 109 mg/dL — ABNORMAL HIGH (ref 65–99)
Potassium: 4.8 mmol/L (ref 3.5–5.3)
Sodium: 139 mmol/L (ref 135–146)
Total Bilirubin: 0.4 mg/dL (ref 0.2–1.2)
Total Protein: 6.6 g/dL (ref 6.1–8.1)
eGFR: 55 mL/min/{1.73_m2} — ABNORMAL LOW (ref >=60)

## 2022-01-13 LAB — CBC WITH DIFFERENTIAL/PLATELET
Absolute Monocytes: 595 cells/uL (ref 200–950)
Basophils Absolute: 32 cells/uL (ref 0–200)
Basophils Relative: 0.5 %
Eosinophils Absolute: 122 cells/uL (ref 15–500)
Eosinophils Relative: 1.9 %
HCT: 33 % — ABNORMAL LOW (ref 38.5–50.0)
Hemoglobin: 11.1 g/dL — ABNORMAL LOW (ref 13.2–17.1)
Lymphs Abs: 1274 cells/uL (ref 850–3900)
MCH: 28.2 pg (ref 27.0–33.0)
MCHC: 33.6 g/dL (ref 32.0–36.0)
MCV: 84 fL (ref 80.0–100.0)
MPV: 11.9 fL (ref 7.5–12.5)
Monocytes Relative: 9.3 %
Neutro Abs: 4378 cells/uL (ref 1500–7800)
Neutrophils Relative %: 68.4 %
Platelets: 237 10*3/uL (ref 140–400)
RBC: 3.93 10*6/uL — ABNORMAL LOW (ref 4.20–5.80)
RDW: 12.3 % (ref 11.0–15.0)
Total Lymphocyte: 19.9 %
WBC: 6.4 10*3/uL (ref 3.8–10.8)

## 2022-01-13 LAB — LIPID PANEL
Cholesterol: 145 mg/dL (ref <200)
HDL: 45 mg/dL (ref >=40)
LDL Cholesterol (Calc): 83 mg/dL (calc)
Non-HDL Cholesterol (Calc): 100 mg/dL (calc) (ref <130)
Total CHOL/HDL Ratio: 3.2 (calc) (ref <5.0)
Triglycerides: 79 mg/dL (ref <150)

## 2022-01-13 LAB — TSH: TSH: 1.69 mIU/L (ref 0.40–4.50)

## 2022-01-13 LAB — HEMOGLOBIN A1C
Hgb A1c MFr Bld: 6.1 % of total Hgb — ABNORMAL HIGH (ref <5.7)
Mean Plasma Glucose: 128 mg/dL
eAG (mmol/L): 7.1 mmol/L

## 2022-01-13 LAB — VITAMIN D 25 HYDROXY (VIT D DEFICIENCY, FRACTURES): Vit D, 25-Hydroxy: 69 ng/mL (ref 30–100)

## 2022-01-13 LAB — VITAMIN B12: Vitamin B-12: 667 pg/mL (ref 200–1100)

## 2022-01-19 ENCOUNTER — Ambulatory Visit (INDEPENDENT_AMBULATORY_CARE_PROVIDER_SITE_OTHER): Payer: PPO | Admitting: Family Medicine

## 2022-01-19 ENCOUNTER — Encounter: Payer: Self-pay | Admitting: Family Medicine

## 2022-01-19 VITALS — BP 134/68 | HR 77 | Ht 73.0 in | Wt 135.0 lb

## 2022-01-19 DIAGNOSIS — E440 Moderate protein-calorie malnutrition: Secondary | ICD-10-CM | POA: Diagnosis not present

## 2022-01-19 DIAGNOSIS — I480 Paroxysmal atrial fibrillation: Secondary | ICD-10-CM | POA: Diagnosis not present

## 2022-01-19 DIAGNOSIS — R7303 Prediabetes: Secondary | ICD-10-CM | POA: Diagnosis not present

## 2022-01-19 DIAGNOSIS — N183 Chronic kidney disease, stage 3 unspecified: Secondary | ICD-10-CM

## 2022-01-19 DIAGNOSIS — F02A0 Dementia in other diseases classified elsewhere, mild, without behavioral disturbance, psychotic disturbance, mood disturbance, and anxiety: Secondary | ICD-10-CM | POA: Diagnosis not present

## 2022-01-19 DIAGNOSIS — R531 Weakness: Secondary | ICD-10-CM

## 2022-01-19 DIAGNOSIS — I129 Hypertensive chronic kidney disease with stage 1 through stage 4 chronic kidney disease, or unspecified chronic kidney disease: Secondary | ICD-10-CM

## 2022-01-19 DIAGNOSIS — E782 Mixed hyperlipidemia: Secondary | ICD-10-CM

## 2022-01-19 DIAGNOSIS — M159 Polyosteoarthritis, unspecified: Secondary | ICD-10-CM

## 2022-01-19 DIAGNOSIS — F5104 Psychophysiologic insomnia: Secondary | ICD-10-CM

## 2022-01-19 DIAGNOSIS — Z0000 Encounter for general adult medical examination without abnormal findings: Secondary | ICD-10-CM | POA: Diagnosis not present

## 2022-01-19 DIAGNOSIS — G309 Alzheimer's disease, unspecified: Secondary | ICD-10-CM | POA: Diagnosis not present

## 2022-01-19 NOTE — Patient Instructions (Addendum)
Thank you for coming to the office today.  Future consider the following options  Physical therapy - vs [place] Management Team - we can refer, they are linked with our office through [facility]. To get a Nurse Case Manager, Social Worker team to help you help him and determine if any available services are available for his needs.  Medication options  - Donepezil (Aricept) - for memory, this is high risk for diarrhea unfortunately  - Memantine (Namenda) - also for dementia and memory, it does not address the weakness aspect, but it has some potential side effects with cognition and balance and dizzinsess. It could be tried if you are interested  Possible Neurology consult.   Please schedule a Follow-up Appointment to: Return in about 6 months (around 07/20/2022) for 6 month follow-up can do virtual video visit - dementia.  If you have any other questions or concerns, please feel free to call the office or send a message through [place]. You may also schedule an earlier appointment if necessary.  Additionally, you may be receiving a survey about your experience at our office within a few days to 1 week by e-mail or mail. We value your feedback.  [name], DO [place]

## 2022-01-19 NOTE — Progress Notes (Signed)
Subjective:    Patient ID: [id number] [name], male    DOB: [birth year], 89 y.o.   MRN: [id number]  [name] is a 89 y.o. male presenting on 01/19/2022 for Annual Exam  Here with wife Darrell who provides majority of history.  HPI  Here for Annual Physical and Lab Review.  Pre-Diabetes Previously controlled A1c 6.1, up from prior 5.6 CBGs: not checking Meds: none Currently not on ACEi / ARB Lifestyle: - Diet reduced intake Denies hypoglycemia, polyuria, visual changes, numbness or tingling.  Alzheimer's Dementia Generalized Weakness. Severely worsening with difficulty with some paralysis difficulty getting out of bed. Night-time is difficulty he is often awake and eating and going to bathroom. Reduced speech. Difficulty with word finding. Speaks gibberish.  Sundowning - worse around 4pm and then gradual worse overnight. He is aware of orientation to self and wife most of the time. He has some long term memory intact. Some issues with forgetfulness on daily basis still. Muscle weakness is severe. Central core strength is declining. Limited mobility due to this at times but he is still eager to get up and active and go out at times with wife. Gradual hearing decline  Lab review showed Mild persistent normocytic anemia, Hgb 11.1, previously >1 yr ago 10-12 range.  Weight Loss / Appetite He has improved and gained some weight now recently with some improved appetite  No longer with palliative care.   Insomnia difficulty, restless medications worse. Sundowners worse. Failed meds trazodone, seroquel, remeron Seems Tylenol PM helps him rest at night actually helps - not causing side effects   GERD / Dysphagia w/ esophageal stricture Moderate protein calorie malnutrition secondary to dysphagia and dementia Dementia, mild, alzheimer's FAST 6A   Chronic Osteoarthritis, Back Pain Follow-up on chronic issue, he has tried some Tylenol Arthritis str 1-2 times a day with mild  relief, Taking Meloxicam PRN. Using infrequently Using Tylenol QHS PM with good results. Followed by Orthopedic specialist. Admits issues with hips Denies any injury, fall, numbness tingling weakness    Paroxysmal Atrial Fibrillation S/p MAZE procedure Off Anticoagulation. Has maintained sinus rhythm He had one episode of pulse racing irregularly.    BP reduced due to wt loss On Amlodipine '5mg'$  daily   Previously Hypogonadism, low T Followed by [facility] Off testosterone   Glaucoma / Cataract Surgery on Wednesday Post-op recently Dry eye irritation     01/19/2022    2:16 PM 08/13/2021    2:54 PM 07/09/2021    2:15 PM  Depression screen PHQ 2/9  Decreased Interest '3 1 1  '$ Down, Depressed, Hopeless '2 1 1  '$ PHQ - 2 Score '5 2 2  '$ Altered sleeping '3 1 3  '$ Tired, decreased energy '3 1 3  '$ Change in appetite 3 0 0  Feeling bad or failure about yourself  2 0 0  Trouble concentrating '3 1 3  '$ Moving slowly or fidgety/restless '3 1 3  '$ Suicidal thoughts 0 0 0  PHQ-9 Score '22 6 14  '$ Difficult doing work/chores Extremely dIfficult Somewhat difficult Very difficult    Past Medical History:  Diagnosis Date   Arthritis    Coronary artery disease    a. 1993 s/p PCI/BMS LAD;  b. 2003 PCI RCA;  c. 09/2006 ISR LAD->PCI, PCI RCA;  d. 02/2007 MV: no ischemia;  d. 01/2012 MV: EF 74%, no ischemia.   History of prostate cancer    Hyperlipidemia    Hyperthyroidism    Low testosterone    PAF (paroxysmal atrial  fibrillation) ([place])    a. previously on amio ->stopped in 2013;  b. recurrent PAF 01/2012->Pradaxa and amio initiated, subsequently came off of pradaxa 2/2 cost;  c. 02/2012 Echo: EF 55-60%, Gr 1 DD.   Prostate cancer ([place])    S/P CABG x 4    Wears dentures    partial upper and lower   Past Surgical History:  Procedure Laterality Date   APPENDECTOMY     Dr. Bary Castilla   CARDIAC CATHETERIZATION  2008   CATARACT EXTRACTION W/PHACO Left 07/07/2021   Procedure: CATARACT EXTRACTION PHACO AND  INTRAOCULAR LENS PLACEMENT ([place]) LEFT;  Surgeon: Leandrew Koyanagi, MD;  Location: [place];  Service: Ophthalmology;  Laterality: Left;  9.52 1:16.2   CATARACT EXTRACTION W/PHACO Right 07/28/2021   Procedure: CATARACT EXTRACTION PHACO AND INTRAOCULAR LENS PLACEMENT ([place]) RIGHT  KAHOOK DUAL BLADE GONIOTOMY 7.63 01:01.0;  Surgeon: Leandrew Koyanagi, MD;  Location: [place];  Service: Ophthalmology;  Laterality: Right;   CLIPPING OF ATRIAL APPENDAGE Left 05/18/2012   Procedure: CLIPPING OF ATRIAL APPENDAGE;  Surgeon: [name], MD;  Location: [place];  Service: Open Heart Surgery;  Laterality: Left;   COLONOSCOPY  2013   COLONOSCOPY WITH PROPOFOL N/A 07/12/2016   Procedure: COLONOSCOPY WITH PROPOFOL;  Surgeon: [name], MD;  Location[facility] ENDOSCOPY;  Service: Endoscopy;  Laterality: N/A;   CORONARY ARTERY BYPASS GRAFT N/A 05/18/2012   Procedure: CORONARY ARTERY BYPASS GRAFTING (CABG);  Surgeon: [name], MD;  Location: [place];  Service: Open Heart Surgery;  Laterality: N/A;  Times 4 using left internal mammary artery and endoscopically harvested right saphenous vein   ESOPHAGOGASTRODUODENOSCOPY (EGD) WITH PROPOFOL N/A 10/24/2017   Procedure: ESOPHAGOGASTRODUODENOSCOPY (EGD) WITH PROPOFOL;  Surgeon: [name], MD;  Location: [facility] ENDOSCOPY;  Service: Endoscopy;  Laterality: N/A;   HERNIA REPAIR     s/p mesh bilaterally, Dr. Bary Castilla   INTRAOPERATIVE TRANSESOPHAGEAL ECHOCARDIOGRAM N/A 05/18/2012   Procedure: INTRAOPERATIVE TRANSESOPHAGEAL ECHOCARDIOGRAM;  Surgeon: [name], MD;  Location: [place];  Service: Open Heart Surgery;  Laterality: N/A;   IR ESOPHAGUS DILITATION RETRO FLUORO     LEFT HEART CATHETERIZATION WITH CORONARY ANGIOGRAM Bilateral 05/16/2012   Procedure: LEFT HEART CATHETERIZATION WITH CORONARY ANGIOGRAM;  Surgeon: Burnell Blanks, MD;  Location: [facility] CATH LAB;  Service: Cardiovascular;  Laterality: Bilateral;   MAZE N/A 05/18/2012    Procedure: MAZE;  Surgeon: [name], MD;  Location: [place];  Service: Open Heart Surgery;  Laterality: N/A;   MOHS SURGERY     PROSTATECTOMY     TOTAL HIP ARTHROPLASTY Right    Dr. 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 He was born in [place] and went to [employer] in [place]. Daughter in [place], 2 step-daughters. Retired from [employer].   Social Determinants of Health   Financial Resource Strain: Low Risk  (08/13/2021)   Overall Financial Resource Strain (CARDIA)    Difficulty of Paying Living Expenses: Not hard at all  Food Insecurity: No Food Insecurity (08/13/2021)   Hunger Vital Sign    Worried About Running Out of Food in the Last Year: Never true    Ran Out of Food in the Last Year: Never true  Transportation Needs: No Transportation Needs (08/13/2021)   PRAPARE - Hydrologist (Medical): No    Lack of Transportation (Non-Medical): No  Physical Activity: Insufficiently Active (08/13/2021)   Exercise Vital Sign    Days of Exercise per Week: 7 days    Minutes of Exercise per Session: 20 min  Stress: No Stress Concern Present (08/13/2021)   [place]    Feeling of Stress : Not at all  Social Connections: Socially Isolated (08/13/2021)   Social Connection and Isolation Panel [NHANES]    Frequency of Communication with Friends and Family: Once a week    Frequency of Social Gatherings with Friends and Family: Once a week    Attends Religious Services: Never    Marine scientist or Organizations: No    Attends Archivist Meetings: Never    Marital Status: Married  Human resources officer Violence: Not At Risk (08/13/2021)   Humiliation, Afraid, Rape, and Kick questionnaire    Fear of Current or Ex-Partner: No    Emotionally Abused: No    Physically Abused: No    Sexually Abused: No   Family History  Problem Relation Age of Onset   Coronary artery disease Mother    Heart disease Mother    Coronary artery disease Sister    Diabetes Sister    Heart disease Sister 57       deceased   Diabetes Father    Heart disease Brother    Current Outpatient Medications on File Prior to Visit  Medication Sig   bisacodyl (DULCOLAX) 10 MG suppository Place 10 mg rectally daily as needed for moderate constipation.   celecoxib (CELEBREX) 100 MG capsule TAKE ONE CAPSULE BY MOUTH TWICE A DAY   Cholecalciferol (VITAMIN D3) 1.25 MG (50000 UT) CAPS Take 5,000 Units by mouth daily.   dorzolamide-timolol (COSOPT) 22.3-6.8 MG/ML ophthalmic solution 1 drop 2 (two) times daily.   ezetimibe (ZETIA) 10 MG tablet TAKE 1 TABLET BY MOUTH DAILY   FERROUS SULFATE PO Take by mouth once a week.   fluticasone (FLONASE) 50 MCG/ACT nasal spray SPRAY TWO SPRAYS IN EACH NOSTRIL ONCE DAILY - USE FOR 4-6 WEEKS THEN STOP AND USE SEASONALLY OR AS NEEDED   latanoprost (XALATAN) 0.005 % ophthalmic solution 0.005 drops daily.   meloxicam (MOBIC) 15 MG tablet    meloxicam (MOBIC) 7.5 MG tablet    pantoprazole (PROTONIX) 40 MG tablet    polyethylene glycol powder (GLYCOLAX/MIRALAX) 17 GM/SCOOP powder Take 17-34 g by mouth daily. For  constipation   SENNA PO Take by mouth daily.   vitamin C (ASCORBIC ACID) 250 MG tablet Take 250 mg by mouth daily.   baclofen (LIORESAL) 10 MG tablet  (Patient not taking: Reported on 01/19/2022)   No current facility-administered medications  on file prior to visit.    Review of Systems  Constitutional:  Negative for activity change, appetite change, chills, diaphoresis, fatigue and fever.  HENT:  Negative for congestion and hearing loss.   Eyes:  Negative for visual disturbance.  Respiratory:  Negative for cough, chest tightness, shortness of breath and wheezing.   Cardiovascular:  Negative for chest pain, palpitations and leg swelling.  Gastrointestinal:  Negative for abdominal pain, constipation, diarrhea, nausea and vomiting.  Genitourinary:  Negative for dysuria, frequency and hematuria.  Musculoskeletal:  Negative for arthralgias and neck pain.  Skin:  Negative for rash.  Neurological:  Positive for weakness. Negative for dizziness, light-headedness, numbness and headaches.  Hematological:  Negative for adenopathy.  Psychiatric/Behavioral:  Positive for confusion, decreased concentration and sleep disturbance. Negative for behavioral problems and dysphoric mood.    Per HPI unless specifically indicated above      Objective:    BP 134/68   Pulse 77   Ht '6\' 1"'$  (1.854 m)   Wt 135 lb (61.2 kg)   SpO2 100%   BMI 17.81 kg/m   Wt Readings from Last 3 Encounters:  01/19/22 135 lb (61.2 kg)  09/28/21 133 lb 6 oz (60.5 kg)  08/13/21 133 lb (60.3 kg)    Physical Exam Vitals and nursing note reviewed.  Constitutional:      General: He is not in acute distress.    Appearance: He is well-developed. He is not diaphoretic.     Comments: Chronically ill but currently mostly well appearing, thin with some weight loss, comfortable, cooperative  HENT:     Head: Normocephalic and atraumatic.  Eyes:     General:        Right eye: No discharge.        Left eye: No discharge.      Conjunctiva/sclera: Conjunctivae normal.  Neck:     Thyroid: No thyromegaly.  Cardiovascular:     Rate and Rhythm: Normal rate and regular rhythm.     Heart sounds: Normal heart sounds. No murmur heard. Pulmonary:     Effort: Pulmonary effort is normal. No respiratory distress.     Breath sounds: Normal breath sounds. No wheezing or rales.  Musculoskeletal:        General: Normal range of motion.     Cervical back: Normal range of motion and neck supple.     Comments: Proximal muscle atrophy lower extremity  Lymphadenopathy:     Cervical: No cervical adenopathy.  Skin:    General: Skin is warm and dry.     Findings: No erythema or rash.  Neurological:     Mental Status: He is alert and oriented to person, place, and time.  Psychiatric:        Behavior: Behavior normal.     Comments: Well groomed, good eye contact, speech is infrequent but mostly normal today. Some tangential or inconsistent phrases based on our conversation. Follows commands.      Results for orders placed or performed in visit on 01/11/22  TSH  Result Value Ref Range   TSH 1.69 0.40 - 4.50 mIU/L  VITAMIN D 25 Hydroxy (Vit-D Deficiency, Fractures)  Result Value Ref Range   Vit D, 25-Hydroxy 69 30 - 100 ng/mL  Vitamin B12  Result Value Ref Range   Vitamin B-12 667 200 - 1,100 pg/mL  Hemoglobin A1c  Result Value Ref Range   Hgb A1c MFr Bld 6.1 (H) <5.7 % of total Hgb   Mean Plasma Glucose 128 mg/dL  eAG (mmol/L) 7.1 mmol/L  Lipid panel  Result Value Ref Range   Cholesterol 145 <200 mg/dL   HDL 45 > OR = 40 mg/dL   Triglycerides 79 <150 mg/dL   LDL Cholesterol (Calc) 83 mg/dL (calc)   Total CHOL/HDL Ratio 3.2 <5.0 (calc)   Non-HDL Cholesterol (Calc) 100 <130 mg/dL (calc)  CBC with Differential/Platelet  Result Value Ref Range   WBC 6.4 3.8 - 10.8 Thousand/uL   RBC 3.93 (L) 4.20 - 5.80 Million/uL   Hemoglobin 11.1 (L) 13.2 - 17.1 g/dL   HCT 33.0 (L) 38.5 - 50.0 %   MCV 84.0 80.0 - 100.0 fL   MCH  28.2 27.0 - 33.0 pg   MCHC 33.6 32.0 - 36.0 g/dL   RDW 12.3 11.0 - 15.0 %   Platelets 237 140 - 400 Thousand/uL   MPV 11.9 7.5 - 12.5 fL   Neutro Abs 4,378 1,500 - 7,800 cells/uL   Lymphs Abs 1,274 850 - 3,900 cells/uL   Absolute Monocytes 595 200 - 950 cells/uL   Eosinophils Absolute 122 15 - 500 cells/uL   Basophils Absolute 32 0 - 200 cells/uL   Neutrophils Relative % 68.4 %   Total Lymphocyte 19.9 %   Monocytes Relative 9.3 %   Eosinophils Relative 1.9 %   Basophils Relative 0.5 %  COMPLETE METABOLIC PANEL WITH GFR  Result Value Ref Range   Glucose, Bld 109 (H) 65 - 99 mg/dL   BUN 24 7 - 25 mg/dL   Creat 1.27 (H) 0.70 - 1.22 mg/dL   eGFR 55 (L) > OR = 60 mL/min/1.49m   BUN/Creatinine Ratio 19 6 - 22 (calc)   Sodium 139 135 - 146 mmol/L   Potassium 4.8 3.5 - 5.3 mmol/L   Chloride 105 98 - 110 mmol/L   CO2 27 20 - 32 mmol/L   Calcium 9.4 8.6 - 10.3 mg/dL   Total Protein 6.6 6.1 - 8.1 g/dL   Albumin 4.2 3.6 - 5.1 g/dL   Globulin 2.4 1.9 - 3.7 g/dL (calc)   AG Ratio 1.8 1.0 - 2.5 (calc)   Total Bilirubin 0.4 0.2 - 1.2 mg/dL   Alkaline phosphatase (APISO) 78 35 - 144 U/L   AST 20 10 - 35 U/L   ALT 18 9 - 46 U/L      Assessment & Plan:   Problem List Items Addressed This Visit     Benign hypertension with CKD (chronic kidney disease) stage III (HCC)   Generalized weakness   Hyperlipidemia   Insomnia   Mild dementia (HCC)   Moderate protein-calorie malnutrition (HCC)   Paroxysmal atrial fibrillation (HCC)   Pre-diabetes   Primary osteoarthritis involving multiple joints   Other Visit Diagnoses     Annual physical exam    -  Primary       Updated Health Maintenance information Reviewed recent lab results with patient Encouraged improvement to lifestyle with diet and exercise Goal maintain weight.  Remains off Amldoipine for BP  Continue Tylenol PM as tolerated for sleep  No longer followed by Palliative Care. May resume in future if  indicated.  Reviewed lab results. Overall reassuring.  Alzheimer's / Dementia Long discussion today regarding progression over past few years on cognitive decline / physical decline. Discussed that sundowning seems to be one of key symptoms impacting with disturbances overnight No significant behavioral changes Daily function is limited by weakness and physical symptoms more than cognitive Reviewed medication options again - Donepezil (Aricept) and Memantine (Namenda) -  reviewed potential side effects, and goal for preserve cognitive function or slow decline, mixed and benefit may outweigh risk of side effects as discussed. Mutual decision to hold on trial of medications. - Has not seen Neurology. Again offered, reviewed options. We agree to hold off. - Consider Chronic Care Management Team for more caregiver / needs support outside of visits, and they are not interested today, will notify me if change mind  Reassurance with maintain appetite, still active with assistance and functional to leave the house and be active with wife at times. Considering future Physical Therapy vs Home Health or other care aide if needed to maintain mobility.  Note Foot Drop, may benefit from device or supportive equipment.  No orders of the defined types were placed in this encounter.     Follow up plan: Return in about 6 months (around 07/20/2022) for 6 month follow-up can do virtual video visit - dementia.  [name], [facility] 01/19/2022, 2:23 PM

## 2022-01-20 ENCOUNTER — Emergency Department: Payer: PPO

## 2022-01-20 ENCOUNTER — Other Ambulatory Visit: Payer: Self-pay

## 2022-01-20 ENCOUNTER — Encounter: Payer: Self-pay | Admitting: Internal Medicine

## 2022-01-20 ENCOUNTER — Inpatient Hospital Stay
Admission: EM | Admit: 2022-01-20 | Discharge: 2022-01-26 | DRG: 535 | Disposition: A | Discharge status: Hospice | Payer: PPO | Attending: Internal Medicine | Admitting: Internal Medicine

## 2022-01-20 DIAGNOSIS — Z87891 Personal history of nicotine dependence: Secondary | ICD-10-CM | POA: Diagnosis not present

## 2022-01-20 DIAGNOSIS — E785 Hyperlipidemia, unspecified: Secondary | ICD-10-CM | POA: Diagnosis not present

## 2022-01-20 DIAGNOSIS — Z7189 Other specified counseling: Secondary | ICD-10-CM | POA: Diagnosis not present

## 2022-01-20 DIAGNOSIS — Z833 Family history of diabetes mellitus: Secondary | ICD-10-CM

## 2022-01-20 DIAGNOSIS — N1831 Chronic kidney disease, stage 3a: Secondary | ICD-10-CM | POA: Diagnosis present

## 2022-01-20 DIAGNOSIS — R54 Age-related physical debility: Secondary | ICD-10-CM | POA: Diagnosis present

## 2022-01-20 DIAGNOSIS — E440 Moderate protein-calorie malnutrition: Secondary | ICD-10-CM | POA: Diagnosis not present

## 2022-01-20 DIAGNOSIS — R7303 Prediabetes: Secondary | ICD-10-CM | POA: Diagnosis present

## 2022-01-20 DIAGNOSIS — Z955 Presence of coronary angioplasty implant and graft: Secondary | ICD-10-CM | POA: Diagnosis not present

## 2022-01-20 DIAGNOSIS — Z8546 Personal history of malignant neoplasm of prostate: Secondary | ICD-10-CM | POA: Diagnosis not present

## 2022-01-20 DIAGNOSIS — M978XXA Periprosthetic fracture around other internal prosthetic joint, initial encounter: Principal | ICD-10-CM

## 2022-01-20 DIAGNOSIS — F0390 Unspecified dementia without behavioral disturbance: Secondary | ICD-10-CM | POA: Diagnosis not present

## 2022-01-20 DIAGNOSIS — D72829 Elevated white blood cell count, unspecified: Secondary | ICD-10-CM | POA: Diagnosis not present

## 2022-01-20 DIAGNOSIS — K219 Gastro-esophageal reflux disease without esophagitis: Secondary | ICD-10-CM | POA: Diagnosis not present

## 2022-01-20 DIAGNOSIS — Z66 Do not resuscitate: Secondary | ICD-10-CM | POA: Diagnosis present

## 2022-01-20 DIAGNOSIS — M50321 Other cervical disc degeneration at C4-C5 level: Secondary | ICD-10-CM | POA: Diagnosis present

## 2022-01-20 DIAGNOSIS — I2510 Atherosclerotic heart disease of native coronary artery without angina pectoris: Secondary | ICD-10-CM | POA: Diagnosis present

## 2022-01-20 DIAGNOSIS — S72001A Fracture of unspecified part of neck of right femur, initial encounter for closed fracture: Secondary | ICD-10-CM | POA: Diagnosis not present

## 2022-01-20 DIAGNOSIS — Z681 Body mass index (BMI) 19 or less, adult: Secondary | ICD-10-CM

## 2022-01-20 DIAGNOSIS — R34 Anuria and oliguria: Secondary | ICD-10-CM | POA: Diagnosis not present

## 2022-01-20 DIAGNOSIS — S72001D Fracture of unspecified part of neck of right femur, subsequent encounter for closed fracture with routine healing: Secondary | ICD-10-CM | POA: Diagnosis not present

## 2022-01-20 DIAGNOSIS — R404 Transient alteration of awareness: Secondary | ICD-10-CM | POA: Diagnosis not present

## 2022-01-20 DIAGNOSIS — W19XXXA Unspecified fall, initial encounter: Secondary | ICD-10-CM

## 2022-01-20 DIAGNOSIS — Y92002 Bathroom of unspecified non-institutional (private) residence single-family (private) house as the place of occurrence of the external cause: Secondary | ICD-10-CM

## 2022-01-20 DIAGNOSIS — Z96649 Presence of unspecified artificial hip joint: Secondary | ICD-10-CM

## 2022-01-20 DIAGNOSIS — Z951 Presence of aortocoronary bypass graft: Secondary | ICD-10-CM | POA: Diagnosis not present

## 2022-01-20 DIAGNOSIS — Z79899 Other long term (current) drug therapy: Secondary | ICD-10-CM

## 2022-01-20 DIAGNOSIS — Z043 Encounter for examination and observation following other accident: Secondary | ICD-10-CM | POA: Diagnosis not present

## 2022-01-20 DIAGNOSIS — F03A0 Unspecified dementia, mild, without behavioral disturbance, psychotic disturbance, mood disturbance, and anxiety: Secondary | ICD-10-CM | POA: Diagnosis present

## 2022-01-20 DIAGNOSIS — Z8249 Family history of ischemic heart disease and other diseases of the circulatory system: Secondary | ICD-10-CM

## 2022-01-20 DIAGNOSIS — I10 Essential (primary) hypertension: Secondary | ICD-10-CM | POA: Diagnosis not present

## 2022-01-20 DIAGNOSIS — Z751 Person awaiting admission to adequate facility elsewhere: Secondary | ICD-10-CM

## 2022-01-20 DIAGNOSIS — R400 Somnolence: Secondary | ICD-10-CM | POA: Diagnosis present

## 2022-01-20 DIAGNOSIS — Z8679 Personal history of other diseases of the circulatory system: Secondary | ICD-10-CM | POA: Diagnosis not present

## 2022-01-20 DIAGNOSIS — W010XXA Fall on same level from slipping, tripping and stumbling without subsequent striking against object, initial encounter: Secondary | ICD-10-CM | POA: Diagnosis not present

## 2022-01-20 DIAGNOSIS — E43 Unspecified severe protein-calorie malnutrition: Secondary | ICD-10-CM | POA: Diagnosis present

## 2022-01-20 DIAGNOSIS — I480 Paroxysmal atrial fibrillation: Secondary | ICD-10-CM | POA: Diagnosis present

## 2022-01-20 DIAGNOSIS — Y92009 Unspecified place in unspecified non-institutional (private) residence as the place of occurrence of the external cause: Secondary | ICD-10-CM

## 2022-01-20 DIAGNOSIS — F02A2 Dementia in other diseases classified elsewhere, mild, with psychotic disturbance: Secondary | ICD-10-CM | POA: Diagnosis present

## 2022-01-20 DIAGNOSIS — M25551 Pain in right hip: Secondary | ICD-10-CM | POA: Diagnosis present

## 2022-01-20 DIAGNOSIS — F02A11 Dementia in other diseases classified elsewhere, mild, with agitation: Secondary | ICD-10-CM | POA: Diagnosis not present

## 2022-01-20 DIAGNOSIS — S7221XA Displaced subtrochanteric fracture of right femur, initial encounter for closed fracture: Principal | ICD-10-CM | POA: Diagnosis present

## 2022-01-20 DIAGNOSIS — M9701XA Periprosthetic fracture around internal prosthetic right hip joint, initial encounter: Secondary | ICD-10-CM | POA: Diagnosis present

## 2022-01-20 DIAGNOSIS — G309 Alzheimer's disease, unspecified: Secondary | ICD-10-CM | POA: Diagnosis not present

## 2022-01-20 DIAGNOSIS — W1839XA Other fall on same level, initial encounter: Secondary | ICD-10-CM | POA: Diagnosis present

## 2022-01-20 DIAGNOSIS — Z789 Other specified health status: Secondary | ICD-10-CM | POA: Diagnosis not present

## 2022-01-20 DIAGNOSIS — Z515 Encounter for palliative care: Secondary | ICD-10-CM

## 2022-01-20 LAB — CBC WITH DIFFERENTIAL/PLATELET
Abs Immature Granulocytes: 0.07 10*3/uL (ref 0.00–0.07)
Basophils Absolute: 0 10*3/uL (ref 0.0–0.1)
Basophils Relative: 0 %
Eosinophils Absolute: 0.1 10*3/uL (ref 0.0–0.5)
Eosinophils Relative: 1 %
HCT: 33 % — ABNORMAL LOW (ref 39.0–52.0)
Hemoglobin: 10.7 g/dL — ABNORMAL LOW (ref 13.0–17.0)
Immature Granulocytes: 1 %
Lymphocytes Relative: 10 %
Lymphs Abs: 1.4 10*3/uL (ref 0.7–4.0)
MCH: 27.7 pg (ref 26.0–34.0)
MCHC: 32.4 g/dL (ref 30.0–36.0)
MCV: 85.5 fL (ref 80.0–100.0)
Monocytes Absolute: 1.2 10*3/uL — ABNORMAL HIGH (ref 0.1–1.0)
Monocytes Relative: 9 %
Neutro Abs: 10.6 10*3/uL — ABNORMAL HIGH (ref 1.7–7.7)
Neutrophils Relative %: 79 %
Platelets: 216 10*3/uL (ref 150–400)
RBC: 3.86 MIL/uL — ABNORMAL LOW (ref 4.22–5.81)
RDW: 12.7 % (ref 11.5–15.5)
WBC: 13.4 10*3/uL — ABNORMAL HIGH (ref 4.0–10.5)
nRBC: 0 % (ref 0.0–0.2)

## 2022-01-20 LAB — BASIC METABOLIC PANEL
Anion gap: 9 (ref 5–15)
BUN: 24 mg/dL — ABNORMAL HIGH (ref 8–23)
CO2: 23 mmol/L (ref 22–32)
Calcium: 9.1 mg/dL (ref 8.9–10.3)
Chloride: 104 mmol/L (ref 98–111)
Creatinine, Ser: 1.24 mg/dL (ref 0.61–1.24)
GFR, Estimated: 56 mL/min — ABNORMAL LOW (ref >60)
Glucose, Bld: 136 mg/dL — ABNORMAL HIGH (ref 70–99)
Potassium: 3.6 mmol/L (ref 3.5–5.1)
Sodium: 136 mmol/L (ref 135–145)

## 2022-01-20 MED ORDER — POLYETHYLENE GLYCOL 3350 17 G PO PACK: 17.0000 g | PACK | Freq: Every day | ORAL | Status: DC | PRN

## 2022-01-20 MED ORDER — HYDRALAZINE HCL 20 MG/ML IJ SOLN
5.0000 mg | INTRAMUSCULAR | Status: DC | PRN
  Administered 2022-01-20: 5 mg via INTRAVENOUS
  Filled 2022-01-20: qty 1

## 2022-01-20 MED ORDER — HALOPERIDOL LACTATE 5 MG/ML IJ SOLN: 1.0000 mg | Freq: Once | INTRAMUSCULAR | Status: DC

## 2022-01-20 MED ORDER — METHOCARBAMOL 500 MG PO TABS: 500.0000 mg | ORAL_TABLET | Freq: Three times a day (TID) | ORAL | Status: DC | PRN

## 2022-01-20 MED ORDER — FLUTICASONE PROPIONATE 50 MCG/ACT NA SUSP: 1.0000 | Freq: Every day | NASAL | Status: DC | PRN

## 2022-01-20 MED ORDER — MORPHINE SULFATE (PF) 2 MG/ML IV SOLN
1.0000 mg | INTRAVENOUS | Status: DC | PRN
  Administered 2022-01-20 – 2022-01-23 (×5): 1 mg via INTRAVENOUS
  Filled 2022-01-20 (×5): qty 1

## 2022-01-20 MED ORDER — SENNOSIDES-DOCUSATE SODIUM 8.6-50 MG PO TABS: 1.0000 | ORAL_TABLET | Freq: Every evening | ORAL | Status: DC | PRN

## 2022-01-20 MED ORDER — LATANOPROST 0.005 % OP SOLN
1.0000 [drp] | Freq: Every day | OPHTHALMIC | Status: DC
  Administered 2022-01-20 – 2022-01-25 (×6): 1 [drp] via OPHTHALMIC
  Filled 2022-01-20: qty 2.5

## 2022-01-20 MED ORDER — VITAMIN D 25 MCG (1000 UNIT) PO TABS
5000.0000 [IU] | ORAL_TABLET | Freq: Every day | ORAL | Status: DC
  Administered 2022-01-20 – 2022-01-25 (×5): 5000 [IU] via ORAL
  Filled 2022-01-20 (×5): qty 5

## 2022-01-20 MED ORDER — EZETIMIBE 10 MG PO TABS
10.0000 mg | ORAL_TABLET | Freq: Every day | ORAL | Status: DC
  Administered 2022-01-20 – 2022-01-25 (×5): 10 mg via ORAL
  Filled 2022-01-20 (×6): qty 1

## 2022-01-20 MED ORDER — HALOPERIDOL LACTATE 5 MG/ML IJ SOLN
2.0000 mg | Freq: Once | INTRAMUSCULAR | Status: AC
  Administered 2022-01-20: 2 mg via INTRAVENOUS
  Filled 2022-01-20: qty 1

## 2022-01-20 MED ORDER — FENTANYL CITRATE PF 50 MCG/ML IJ SOSY
50.0000 ug | PREFILLED_SYRINGE | Freq: Once | INTRAMUSCULAR | Status: AC
  Administered 2022-01-20: 50 ug via INTRAVENOUS
  Filled 2022-01-20: qty 1

## 2022-01-20 MED ORDER — LIDOCAINE 5 % EX PTCH
1.0000 | MEDICATED_PATCH | CUTANEOUS | Status: DC
  Administered 2022-01-20 – 2022-01-26 (×7): 1 via TRANSDERMAL
  Filled 2022-01-20 (×7): qty 1

## 2022-01-20 MED ORDER — SENNA 8.6 MG PO TABS
1.0000 | ORAL_TABLET | Freq: Every day | ORAL | Status: DC
  Administered 2022-01-20 – 2022-01-25 (×5): 8.6 mg via ORAL
  Filled 2022-01-20 (×5): qty 1

## 2022-01-20 MED ORDER — ONDANSETRON HCL 4 MG/2ML IJ SOLN: 4.0000 mg | Freq: Three times a day (TID) | INTRAMUSCULAR | Status: DC | PRN

## 2022-01-20 MED ORDER — DORZOLAMIDE HCL-TIMOLOL MAL 2-0.5 % OP SOLN
1.0000 [drp] | Freq: Two times a day (BID) | OPHTHALMIC | Status: DC
  Administered 2022-01-20 – 2022-01-26 (×11): 1 [drp] via OPHTHALMIC
  Filled 2022-01-20 (×2): qty 10

## 2022-01-20 MED ORDER — ACETAMINOPHEN 325 MG PO TABS
650.0000 mg | ORAL_TABLET | Freq: Four times a day (QID) | ORAL | Status: DC | PRN
  Administered 2022-01-24 – 2022-01-25 (×3): 650 mg via ORAL
  Filled 2022-01-20 (×4): qty 2

## 2022-01-20 MED ORDER — FENTANYL CITRATE PF 50 MCG/ML IJ SOSY
50.0000 ug | PREFILLED_SYRINGE | INTRAMUSCULAR | Status: DC | PRN
  Administered 2022-01-20 (×2): 50 ug via INTRAVENOUS
  Filled 2022-01-20 (×2): qty 1

## 2022-01-20 MED ORDER — FERROUS SULFATE 325 (65 FE) MG PO TABS
325.0000 mg | ORAL_TABLET | ORAL | Status: DC
  Administered 2022-01-21: 325 mg via ORAL
  Filled 2022-01-20: qty 1

## 2022-01-20 MED ORDER — ENOXAPARIN SODIUM 40 MG/0.4ML IJ SOSY
40.0000 mg | PREFILLED_SYRINGE | INTRAMUSCULAR | Status: DC
  Administered 2022-01-20 – 2022-01-24 (×5): 40 mg via SUBCUTANEOUS
  Filled 2022-01-20 (×6): qty 0.4

## 2022-01-20 MED ORDER — OXYCODONE-ACETAMINOPHEN 5-325 MG PO TABS
1.0000 | ORAL_TABLET | ORAL | Status: DC | PRN
  Administered 2022-01-20 – 2022-01-26 (×12): 1 via ORAL
  Filled 2022-01-20 (×12): qty 1

## 2022-01-20 MED ORDER — BISACODYL 10 MG RE SUPP
10.0000 mg | Freq: Every day | RECTAL | Status: DC | PRN
  Administered 2022-01-25: 10 mg via RECTAL
  Filled 2022-01-20: qty 1

## 2022-01-20 MED ORDER — VITAMIN C 500 MG PO TABS
250.0000 mg | ORAL_TABLET | Freq: Every day | ORAL | Status: DC
  Administered 2022-01-20 – 2022-01-25 (×5): 250 mg via ORAL
  Filled 2022-01-20 (×5): qty 1

## 2022-01-20 MED ORDER — ENSURE ENLIVE PO LIQD
237.0000 mL | Freq: Two times a day (BID) | ORAL | Status: DC
  Administered 2022-01-20 – 2022-01-21 (×3): 237 mL via ORAL

## 2022-01-20 NOTE — ED Provider Notes (Signed)
[facility] Provider Note    Event Date/Time   First MD Initiated Contact with Patient 01/20/22 [phone]     (approximate)   History   Fall   HPI  [name] is a 89 y.o. male who presents to the ED for evaluation of Fall   I reviewed annual PCP visit from just yesterday afternoon, maybe 12 hours ago.  Alzheimer disease, prediabetes, GERD, osteoarthritis, A-fib s/p maze and now off of AC.  Patient presents to the ED from home by EMS for evaluation of a fall and right hip pain.  Patient is disoriented upon arrival and cannot provide much history beyond saying that he fell he does not know why.  Reports right hip pain and no other sites of pain.  84 of history is provided by the wife when she arrives shortly after.  They were in the bedroom and he got up to void but did not turn his light on when doing so and she heard him fall to the ground.  She immediately got up and found him on the floor by the bed and he was conscious without seizure activity or apparent syncope.  Physical Exam   Triage Vital Signs: ED Triage Vitals  Enc Vitals Group     BP      Pulse      Resp      Temp      Temp src      SpO2      Weight      Height      Head Circumference      Peak Flow      Pain Score      Pain Loc      Pain Edu?      Excl. in [place]?     Most recent vital signs: Vitals:   01/20/22 0330 01/20/22 0400  BP: (!) 160/77 (!) 164/78  Pulse: 70 66  Resp:    Temp:    SpO2: 100% 100%    General: Awake, no distress.  CV:  Good peripheral perfusion.  Resp:  Normal effort.  Abd:  No distention.  MSK:  No deformity noted.  Keeping his right hip and knee flexed and is tender to palpation over the right hip joint and proximal femur.  No laceration or evidence of open injury.  Right foot is distally neurovascularly intact. Palpation of other 3 extremities without evidence of deformity, tenderness or trauma.  Full range of motion to these other 3  extremities Neuro:  No focal deficits appreciated. Other:     ED Results / Procedures / Treatments   Labs (all labs ordered are listed, but only abnormal results are displayed) Labs Reviewed - No data to display  EKG   RADIOLOGY Plain film of the pelvis and right hip interpreted by me with a periprosthetic femur fracture. CXR interpreted by me without evidence of acute cardiopulmonary pathology. CT head interpreted by me without evidence of acute intracranial pathology CT cervical spine interpreted by me without evidence of fracture or dislocation  Official radiology report(s): CT HEAD WO CONTRAST (5MM)  Result Date: 01/20/2022 CLINICAL DATA:  Unwitnessed fall EXAM: CT HEAD WITHOUT CONTRAST CT CERVICAL SPINE WITHOUT CONTRAST TECHNIQUE: Multidetector CT imaging of the head and cervical spine was performed following the standard protocol without intravenous contrast. Multiplanar CT image reconstructions of the cervical spine were also generated. RADIATION DOSE REDUCTION: This exam was performed according to the departmental dose-optimization program which includes automated exposure  control, adjustment of the mA and/or kV according to patient size and/or use of iterative reconstruction technique. COMPARISON:  11/04/2021 FINDINGS: CT HEAD FINDINGS Brain: Normal anatomic configuration. Parenchymal volume loss is commensurate with the patient's age. Stable mild periventricular white matter changes are present likely reflecting the sequela of small vessel ischemia. No abnormal intra or extra-axial mass lesion or fluid collection. No abnormal mass effect or midline shift. No evidence of acute intracranial hemorrhage or infarct. Stable borderline ventriculomegaly which appears commensurate with the degree of parenchymal volume loss and likely reflects ex vacuo dilation. Cerebellum unremarkable. Vascular: No asymmetric hyperdense vasculature at the skull base. Skull: Intact Sinuses/Orbits: Paranasal  sinuses are clear. Orbits are unremarkable. Other: Mastoid air cells and middle ear cavities are clear. CT CERVICAL SPINE FINDINGS Alignment: Normal. Skull base and vertebrae: Craniocervical alignment is normal. The atlantodental interval is not widened. No acute fracture of the cervical spine. There is ankylosis of the C6-T1 vertebral bodies anteriorly. Soft tissues and spinal canal: No prevertebral fluid or swelling. No visible canal hematoma. Moderate atherosclerotic calcification within the carotid bifurcations. Disc levels: There is intervertebral disc space narrowing, calcification, and endplate remodeling throughout the cervical spine in keeping with changes of severe degenerative disc disease. Prevertebral soft tissues are not thickened on sagittal reformats. Spinal canal is widely patent. Multilevel uncovertebral and facet arthrosis results in multilevel moderate to severe neuroforaminal narrowing, most severe on the right at C4-5. Upper chest: Negative. Other: None IMPRESSION: 1. No acute intracranial abnormality. No calvarial fracture. 2. No acute fracture or listhesis of the cervical spine. 3. Advanced degenerative disc and degenerative joint disease resulting in multilevel neuroforaminal narrowing, most severe on the right at C4-5. Electronically Signed   By: Fidela Salisbury M.D.   On: 01/20/2022 03:29   CT Cervical Spine Wo Contrast  Result Date: 01/20/2022 CLINICAL DATA:  Unwitnessed fall EXAM: CT HEAD WITHOUT CONTRAST CT CERVICAL SPINE WITHOUT CONTRAST TECHNIQUE: Multidetector CT imaging of the head and cervical spine was performed following the standard protocol without intravenous contrast. Multiplanar CT image reconstructions of the cervical spine were also generated. RADIATION DOSE REDUCTION: This exam was performed according to the departmental dose-optimization program which includes automated exposure control, adjustment of the mA and/or kV according to patient size and/or use of iterative  reconstruction technique. COMPARISON:  11/04/2021 FINDINGS: CT HEAD FINDINGS Brain: Normal anatomic configuration. Parenchymal volume loss is commensurate with the patient's age. Stable mild periventricular white matter changes are present likely reflecting the sequela of small vessel ischemia. No abnormal intra or extra-axial mass lesion or fluid collection. No abnormal mass effect or midline shift. No evidence of acute intracranial hemorrhage or infarct. Stable borderline ventriculomegaly which appears commensurate with the degree of parenchymal volume loss and likely reflects ex vacuo dilation. Cerebellum unremarkable. Vascular: No asymmetric hyperdense vasculature at the skull base. Skull: Intact Sinuses/Orbits: Paranasal sinuses are clear. Orbits are unremarkable. Other: Mastoid air cells and middle ear cavities are clear. CT CERVICAL SPINE FINDINGS Alignment: Normal. Skull base and vertebrae: Craniocervical alignment is normal. The atlantodental interval is not widened. No acute fracture of the cervical spine. There is ankylosis of the C6-T1 vertebral bodies anteriorly. Soft tissues and spinal canal: No prevertebral fluid or swelling. No visible canal hematoma. Moderate atherosclerotic calcification within the carotid bifurcations. Disc levels: There is intervertebral disc space narrowing, calcification, and endplate remodeling throughout the cervical spine in keeping with changes of severe degenerative disc disease. Prevertebral soft tissues are not thickened on sagittal reformats. Spinal canal is widely  patent. Multilevel uncovertebral and facet arthrosis results in multilevel moderate to severe neuroforaminal narrowing, most severe on the right at C4-5. Upper chest: Negative. Other: None IMPRESSION: 1. No acute intracranial abnormality. No calvarial fracture. 2. No acute fracture or listhesis of the cervical spine. 3. Advanced degenerative disc and degenerative joint disease resulting in multilevel  neuroforaminal narrowing, most severe on the right at C4-5. Electronically Signed   By: Fidela Salisbury M.D.   On: 01/20/2022 03:29   DG Chest 1 View  Result Date: 01/20/2022 CLINICAL DATA:  Fall EXAM: CHEST  1 VIEW COMPARISON:  08/18/2021 FINDINGS: Lungs are clear. No pneumothorax or pleural effusion. Coronary artery bypass grafting and left atrial clipping has been performed. Cardiac size within normal limits. Pulmonary vascularity is normal. No acute bone abnormality. IMPRESSION: 1. No active disease. Electronically Signed   By: Fidela Salisbury M.D.   On: 01/20/2022 03:20   DG Hip Unilat W or Wo Pelvis 2-3 Views Right  Result Date: 01/20/2022 CLINICAL DATA:  Initial evaluation for acute trauma, fall. EXAM: DG HIP (WITH OR WITHOUT PELVIS) 2-3V RIGHT COMPARISON:  Prior study from 02/04/2014. FINDINGS: Bones are diffusely osteopenic, somewhat limiting assessment for fine osseous detail. Right total hip arthroplasty in place. There is an acute minimally displaced fracture extending through the subtrochanteric right femur. Femoral and acetabular components remain in place. No acute osseous abnormality about the visualized bony pelvis. Osteoarthritic changes noted about the contralateral left hip. Mild soft tissue swelling about the fracture site. Scattered vascular clips noted about the pelvis. IMPRESSION: Acute minimally displaced subtrochanteric periprosthetic right femoral fracture. Electronically Signed   By: Jeannine Boga M.D.   On: 01/20/2022 03:11    PROCEDURES and INTERVENTIONS:  Procedures  Medications  fentaNYL (SUBLIMAZE) injection 50 mcg (has no administration in time range)  fentaNYL (SUBLIMAZE) injection 50 mcg (50 mcg Intravenous Given 01/20/22 0241)  fentaNYL (SUBLIMAZE) injection 50 mcg (50 mcg Intravenous Given 01/20/22 0431)     IMPRESSION / MDM / ASSESSMENT AND PLAN / ED COURSE  I reviewed the triage vital signs and the nursing notes.  Differential diagnosis includes, but  is not limited to, hip dislocation, fracture, ich, stroke, seizure  {Patient presents with symptoms of an acute illness or injury that is potentially life-threatening.  89yo male presents after a likely mechanical fall with evidence of a periprosthetic femur fracture.  No reports of preceding illnesses and no signs of trauma on exam.  Imaging confirms a subtrochanteric periprosthetic femur fracture on the right.  As below, consulted with orthopedics who will have to further consult with a trauma specialist.  Patient will be signed out to oncoming provider.  Will maintain analgesia and NPO status until then.  Clinical Course as of 01/20/22 0437  Thu Jan 20, 2022  0345 I consult with Dr. Mack Guise. He does not do these surgeries. Later this AM, he will reach out to the trauma ortho folks at [facility] and see what they think. He'll call us back later this AM [DS]  8921 I updated wife of this. She's in agreement [DS]    Clinical Course User Index [DS] Vladimir Crofts, MD     FINAL CLINICAL IMPRESSION(S) / ED DIAGNOSES   Final diagnoses:  Periprosthetic subtrochanteric fracture of femur  Fall, initial encounter     Rx / DC Orders   ED Discharge Orders     None        Note:  This document was prepared using Dragon voice recognition software and may include  unintentional dictation errors.   Vladimir Crofts, MD 01/20/22 [phone]

## 2022-01-20 NOTE — ED Notes (Signed)
This RN sent down green and lavender tubes,

## 2022-01-20 NOTE — ED Triage Notes (Signed)
Pt to ED from home for unwitnessed fall. Pt denies hitting his head. Pt complaint of right hip pain patient is guarding.

## 2022-01-20 NOTE — ED Notes (Signed)
Pt's wife requests a call to home number (on file) with any concerns.

## 2022-01-20 NOTE — Consult Note (Signed)
ORTHOPAEDIC CONSULTATION  REQUESTING PHYSICIAN: Ivor Costa, MD  Chief Complaint: Right hip pain status post fall  HPI: [name] is a 89 y.o. male who presented overnight after a fall and was found to have a periprosthetic right hip fracture.  Status post right total hip arthroplasty by Dr. Hessie Knows in 2016.  The patient in the emergency department this afternoon.  He has been admitted to the blood service.  Personally reviewed the patient's x-rays which show a nondisplaced fracture of his femoral component.  I reviewed these x-rays with Dr. Altamese Arrington, the orthopaedic trauma specialist at [facility]  Dr. Marcelino Scot discussed the case with Dr. Ihor Gully one of the hip specialist at [facility] and both agreed on nonoperative management for the patient's fracture.  Patient's family is at the bedside in the ED this afternoon.  Past Medical History:  Diagnosis Date   Arthritis    Coronary artery disease    a. 1993 s/p PCI/BMS LAD;  b. 2003 PCI RCA;  c. 09/2006 ISR LAD->PCI, PCI RCA;  d. 02/2007 MV: no ischemia;  d. 01/2012 MV: EF 74%, no ischemia.   History of prostate cancer    Hyperlipidemia    Hyperthyroidism    Low testosterone    PAF (paroxysmal atrial fibrillation) ([place])    a. previously on amio ->stopped in 2013;  b. recurrent PAF 01/2012->Pradaxa and amio initiated, subsequently came off of pradaxa 2/2 cost;  c. 02/2012 Echo: EF 55-60%, Gr 1 DD.   Prostate cancer ([place])    S/P CABG x 4    Wears dentures    partial upper and lower   Past Surgical History:  Procedure Laterality Date   APPENDECTOMY     Dr. Bary Castilla   CARDIAC CATHETERIZATION  2008   CATARACT EXTRACTION W/PHACO Left 07/07/2021   Procedure: CATARACT EXTRACTION PHACO AND INTRAOCULAR LENS PLACEMENT ([place]) LEFT;  Surgeon: Leandrew Koyanagi, MD;  Location: [place];  Service: Ophthalmology;  Laterality: Left;  9.52 1:16.2   CATARACT EXTRACTION W/PHACO Right 07/28/2021   Procedure: CATARACT EXTRACTION  PHACO AND INTRAOCULAR LENS PLACEMENT ([place]) RIGHT  KAHOOK DUAL BLADE GONIOTOMY 7.63 01:01.0;  Surgeon: Leandrew Koyanagi, MD;  Location: [place];  Service: Ophthalmology;  Laterality: Right;   CLIPPING OF ATRIAL APPENDAGE Left 05/18/2012   Procedure: CLIPPING OF ATRIAL APPENDAGE;  Surgeon: [name], MD;  Location: [place];  Service: Open Heart Surgery;  Laterality: Left;   COLONOSCOPY  2013   COLONOSCOPY WITH PROPOFOL N/A 07/12/2016   Procedure: COLONOSCOPY WITH PROPOFOL;  Surgeon: [name], MD;  Location: [facility] ENDOSCOPY;  Service: Endoscopy;  Laterality: N/A;   CORONARY ARTERY BYPASS GRAFT N/A 05/18/2012   Procedure: CORONARY ARTERY BYPASS GRAFTING (CABG);  Surgeon: [name], MD;  Location: [place];  Service: Open Heart Surgery;  Laterality: N/A;  Times 4 using left internal mammary artery and endoscopically harvested right saphenous vein   ESOPHAGOGASTRODUODENOSCOPY (EGD) WITH PROPOFOL N/A 10/24/2017   Procedure: ESOPHAGOGASTRODUODENOSCOPY (EGD) WITH PROPOFOL;  Surgeon: [name], MD;  Location: [facility] ENDOSCOPY;  Service: Endoscopy;  Laterality: N/A;   HERNIA REPAIR     s/p mesh bilaterally, Dr. Bary Castilla   INTRAOPERATIVE TRANSESOPHAGEAL ECHOCARDIOGRAM N/A 05/18/2012   Procedure: INTRAOPERATIVE TRANSESOPHAGEAL ECHOCARDIOGRAM;  Surgeon: [name], MD;  Location: [place];  Service: Open Heart Surgery;  Laterality: N/A;   IR ESOPHAGUS DILITATION RETRO FLUORO     LEFT HEART CATHETERIZATION WITH CORONARY ANGIOGRAM Bilateral 05/16/2012   Procedure: LEFT HEART CATHETERIZATION WITH CORONARY  ANGIOGRAM;  Surgeon: Burnell Blanks, MD;  Location: [facility] CATH LAB;  Service: Cardiovascular;  Laterality: Bilateral;   MAZE N/A 05/18/2012   Procedure: MAZE;  Surgeon: [name], MD;  Location: [place];  Service: Open Heart Surgery;  Laterality: N/A;   MOHS SURGERY     PROSTATECTOMY     TOTAL HIP ARTHROPLASTY Right    Dr. 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 He was born in [place] and went to [employer] in [place]. Daughter in [place], 2 step-daughters. Retired from [employer].   Social Determinants of Health   Financial Resource Strain: Low Risk  (08/13/2021)   Overall Financial Resource Strain (CARDIA)    Difficulty of Paying Living Expenses: Not hard at all  Food Insecurity: No Food Insecurity (01/20/2022)   Hunger Vital Sign    Worried About Running Out of Food in the Last Year: Never true    Ran Out of Food in the Last Year: Never true  Transportation Needs: No Transportation Needs (01/20/2022)   PRAPARE - Hydrologist (Medical): No    Lack of Transportation (Non-Medical): No  Physical Activity: Insufficiently Active (08/13/2021)   Exercise Vital Sign    Days of Exercise per Week: 7 days    Minutes of Exercise per Session: 20 min  Stress: No Stress Concern Present (08/13/2021)   [place]    Feeling of Stress : Not at all  Social Connections: Socially Isolated (08/13/2021)   Social Connection and Isolation Panel [NHANES]    Frequency of Communication with Friends and Family: Once a week     Frequency of Social Gatherings with Friends and Family: Once a week    Attends Religious Services: Never    Marine scientist or Organizations: No    Attends Music therapist: Never    Marital Status: Married   Family History  Problem Relation Age of Onset   Coronary artery disease Mother    Heart disease Mother    Coronary artery disease Sister    Diabetes Sister    Heart disease Sister 21       deceased   Diabetes Father    Heart disease Brother    Allergies  Allergen Reactions   Ambien [Zolpidem Tartrate] Other (See Comments)    Super sensitive, Hallucinations   Pravastatin Sodium     REACTION: muscle aches...crestor   Rosuvastatin Calcium     REACTION: muscle aches...crestor   Zolpidem Tartrate  Hallucinations   Prior to Admission medications   Medication Sig Start Date End Date Taking? Authorizing Provider  bisacodyl (DULCOLAX) 10 MG suppository Place 10 mg rectally daily as needed for moderate constipation.   Yes [provider]  celecoxib (CELEBREX) 100 MG capsule TAKE ONE CAPSULE BY MOUTH TWICE A DAY 06/08/21  Yes Karamalegos, Devonne Doughty, DO  Cholecalciferol (VITAMIN D3) 1.25 MG (50000 UT) CAPS Take 5,000 Units by mouth daily.   Yes [provider]  dorzolamide-timolol (COSOPT) 22.3-6.8 MG/ML ophthalmic solution 1 drop 2 (two) times daily. 05/17/19  Yes [provider]  ezetimibe (ZETIA) 10 MG tablet TAKE 1 TABLET BY MOUTH DAILY 09/29/21  Yes Gollan, Kathlene November, MD  FERROUS SULFATE PO Take by mouth once a week.   Yes [provider]  fluticasone (FLONASE) 50 MCG/ACT nasal spray SPRAY TWO SPRAYS IN EACH NOSTRIL ONCE DAILY - USE FOR 4-6 WEEKS THEN STOP AND USE SEASONALLY OR AS NEEDED 04/11/20  Yes Karamalegos, Devonne Doughty, DO  latanoprost (XALATAN) 0.005 % ophthalmic solution 0.005 drops daily. 05/17/19  Yes [provider]  polyethylene glycol powder (GLYCOLAX/MIRALAX) 17 GM/SCOOP powder Take 17-34 g by mouth  daily. For constipation 07/18/19  Yes Karamalegos, Devonne Doughty, DO  SENNA PO Take by mouth daily.   Yes [provider]  vitamin C (ASCORBIC ACID) 250 MG tablet Take 250 mg by mouth daily.   Yes [provider]  baclofen (LIORESAL) 10 MG tablet     [provider]  meloxicam (MOBIC) 15 MG tablet     [provider]  meloxicam (MOBIC) 7.5 MG tablet  01/03/19   [provider]  pantoprazole (PROTONIX) 40 MG tablet     [provider]   CT HEAD WO CONTRAST (5MM)  Result Date: 01/20/2022 CLINICAL DATA:  Unwitnessed fall EXAM: CT HEAD WITHOUT CONTRAST CT CERVICAL SPINE WITHOUT CONTRAST TECHNIQUE: Multidetector CT imaging of the head and cervical spine was performed following the standard protocol without intravenous contrast. Multiplanar CT image reconstructions of the cervical spine were also generated. RADIATION DOSE REDUCTION: This exam was performed according to the departmental dose-optimization program which includes automated exposure control, adjustment of the mA and/or kV according to patient size and/or use of iterative reconstruction technique. COMPARISON:  11/04/2021 FINDINGS: CT HEAD FINDINGS Brain: Normal anatomic configuration. Parenchymal volume loss is commensurate with the patient's age. Stable mild periventricular white matter changes are present likely reflecting the sequela of small vessel ischemia. No abnormal intra or extra-axial mass lesion or fluid collection. No abnormal mass effect or midline shift. No evidence of acute intracranial hemorrhage or infarct. Stable borderline ventriculomegaly which appears commensurate with the degree of parenchymal volume loss and likely reflects ex vacuo dilation. Cerebellum unremarkable. Vascular: No asymmetric hyperdense vasculature at the skull base. Skull: Intact Sinuses/Orbits: Paranasal sinuses are clear. Orbits are unremarkable. Other: Mastoid air cells and middle ear cavities are clear. CT  CERVICAL SPINE FINDINGS Alignment: Normal. Skull base and vertebrae: Craniocervical alignment is normal. The atlantodental interval is not widened. No acute fracture of the cervical spine. There is ankylosis of the C6-T1 vertebral bodies anteriorly. Soft tissues and spinal canal: No prevertebral fluid or swelling. No visible canal hematoma. Moderate atherosclerotic calcification within the carotid bifurcations. Disc levels: There is intervertebral disc space narrowing, calcification, and endplate remodeling throughout the cervical spine in keeping with changes of severe degenerative disc disease. Prevertebral soft tissues are not thickened on sagittal reformats. Spinal canal is widely patent. Multilevel uncovertebral and facet arthrosis results  in multilevel moderate to severe neuroforaminal narrowing, most severe on the right at C4-5. Upper chest: Negative. Other: None IMPRESSION: 1. No acute intracranial abnormality. No calvarial fracture. 2. No acute fracture or listhesis of the cervical spine. 3. Advanced degenerative disc and degenerative joint disease resulting in multilevel neuroforaminal narrowing, most severe on the right at C4-5. Electronically Signed   By: Fidela Salisbury M.D.   On: 01/20/2022 03:29   CT Cervical Spine Wo Contrast  Result Date: 01/20/2022 CLINICAL DATA:  Unwitnessed fall EXAM: CT HEAD WITHOUT CONTRAST CT CERVICAL SPINE WITHOUT CONTRAST TECHNIQUE: Multidetector CT imaging of the head and cervical spine was performed following the standard protocol without intravenous contrast. Multiplanar CT image reconstructions of the cervical spine were also generated. RADIATION DOSE REDUCTION: This exam was performed according to the departmental dose-optimization program which includes automated exposure control, adjustment of the mA and/or kV according to patient size and/or use of iterative reconstruction technique. COMPARISON:  11/04/2021 FINDINGS: CT HEAD FINDINGS Brain: Normal anatomic  configuration. Parenchymal volume loss is commensurate with the patient's age. Stable mild periventricular white matter changes are present likely reflecting the sequela of small vessel ischemia. No abnormal intra or extra-axial mass lesion or fluid collection. No abnormal mass effect or midline shift. No evidence of acute intracranial hemorrhage or infarct. Stable borderline ventriculomegaly which appears commensurate with the degree of parenchymal volume loss and likely reflects ex vacuo dilation. Cerebellum unremarkable. Vascular: No asymmetric hyperdense vasculature at the skull base. Skull: Intact Sinuses/Orbits: Paranasal sinuses are clear. Orbits are unremarkable. Other: Mastoid air cells and middle ear cavities are clear. CT CERVICAL SPINE FINDINGS Alignment: Normal. Skull base and vertebrae: Craniocervical alignment is normal. The atlantodental interval is not widened. No acute fracture of the cervical spine. There is ankylosis of the C6-T1 vertebral bodies anteriorly. Soft tissues and spinal canal: No prevertebral fluid or swelling. No visible canal hematoma. Moderate atherosclerotic calcification within the carotid bifurcations. Disc levels: There is intervertebral disc space narrowing, calcification, and endplate remodeling throughout the cervical spine in keeping with changes of severe degenerative disc disease. Prevertebral soft tissues are not thickened on sagittal reformats. Spinal canal is widely patent. Multilevel uncovertebral and facet arthrosis results in multilevel moderate to severe neuroforaminal narrowing, most severe on the right at C4-5. Upper chest: Negative. Other: None IMPRESSION: 1. No acute intracranial abnormality. No calvarial fracture. 2. No acute fracture or listhesis of the cervical spine. 3. Advanced degenerative disc and degenerative joint disease resulting in multilevel neuroforaminal narrowing, most severe on the right at C4-5. Electronically Signed   By: Fidela Salisbury M.D.    On: 01/20/2022 03:29   DG Chest 1 View  Result Date: 01/20/2022 CLINICAL DATA:  Fall EXAM: CHEST  1 VIEW COMPARISON:  08/18/2021 FINDINGS: Lungs are clear. No pneumothorax or pleural effusion. Coronary artery bypass grafting and left atrial clipping has been performed. Cardiac size within normal limits. Pulmonary vascularity is normal. No acute bone abnormality. IMPRESSION: 1. No active disease. Electronically Signed   By: Fidela Salisbury M.D.   On: 01/20/2022 03:20   DG Hip Unilat W or Wo Pelvis 2-3 Views Right  Result Date: 01/20/2022 CLINICAL DATA:  Initial evaluation for acute trauma, fall. EXAM: DG HIP (WITH OR WITHOUT PELVIS) 2-3V RIGHT COMPARISON:  Prior study from 02/04/2014. FINDINGS: Bones are diffusely osteopenic, somewhat limiting assessment for fine osseous detail. Right total hip arthroplasty in place. There is an acute minimally displaced fracture extending through the subtrochanteric right femur. Femoral and acetabular components remain in  place. No acute osseous abnormality about the visualized bony pelvis. Osteoarthritic changes noted about the contralateral left hip. Mild soft tissue swelling about the fracture site. Scattered vascular clips noted about the pelvis. IMPRESSION: Acute minimally displaced subtrochanteric periprosthetic right femoral fracture. Electronically Signed   By: Jeannine Boga M.D.   On: 01/20/2022 03:11    Positive ROS: All other systems have been reviewed and were otherwise negative with the exception of those mentioned in the HPI and as above.  Physical Exam: General: Patient is drowsy but arousable.  He complains of mild right hip pain.  MUSCULOSKELETAL: Right lower extremity: Rotated to his left in the bed.  He is hip and knee are flexed but there appears to be no obvious shortening of the right lower extremity.  The skin overlying the right hip is intact.  There is no erythema ecchymosis or swelling.  His thigh compartment is soft with compressible.   Distally he is neurovascularly intact with intact motor function.  Assessment: Nondisplaced periprosthetic right proximal femur fracture.  Plan:  The patient has dementia and is drowsy today.  I explained to the family the nature of the patient's fracture.  I told them that I had discussed this case with Dr. Marcelino Scot and Dr. Alvan Dame at [facility] as I do not treat periprosthetic stress. I explained to the family that they have recommended nonoperative management for the patient's fracture.  The patient will be 25% partial weightbearing on the right lower extremity with trochanteric precautions including no active abduction or rotation on the right hip, as his pain allows.  Will need a skilled nursing facility upon discharge.  Patient's wife states that he sundowns and despite her best efforts he frequently tries to get out of bed and has had a couple recent falls.  If the patient's fracture displaces or if he is in significant pain other consideration to surgical management may need to be given.  For now there is no plan for surgical intervention.  The patient's family understood and agreed with this plan.  I answered all their questions today.    Thornton Park, MD    01/20/2022 4:15 PM

## 2022-01-20 NOTE — ED Provider Notes (Signed)
-----------------------------------------   7:04 AM on 01/20/2022 -----------------------------------------  Blood pressure (!) 158/69, pulse 71, temperature 97.9 F (36.6 C), temperature source Oral, resp. rate 16, height '6\' 1"'$  (1.854 m), weight 61.2 kg, SpO2 97 %.  Assuming care from Dr. Tamala Julian.  In short, [name] is a 89 y.o. male with a chief complaint of Fall .  Refer to the original H&P for additional details.  The current plan of care is to follow-up ortho recs for periprosthetic hip fracture.  ----------------------------------------- 8:39 AM on 01/20/2022 ----------------------------------------- Case discussed with Dr. Mack Guise of orthopedics, who had further discussed imaging with trauma specialist at [facility].  They are recommending nonoperative management for now with admission to the hospitalist service and likely rehab placement.  Case discussed with hospitalist for admission.    Blake Divine, MD 01/20/22 [phone]

## 2022-01-20 NOTE — ED Notes (Addendum)
SBP >165 for multiple BP checks giving PRN hydralazine

## 2022-01-20 NOTE — ED Notes (Signed)
Pt placed on a hospital bed

## 2022-01-20 NOTE — ED Notes (Signed)
Md Blaine Hamper at bedside

## 2022-01-20 NOTE — H&P (Signed)
History and Physical    KALUB MORILLO BBC:[id number] DOB: Jul 31, 1932 DOA: 01/20/2022  Referring MD/NP/PA:   PCP: [name], DO   Patient coming from:  The patient is coming from home.         Chief Complaint: fall and right hip pain  HPI: [name] is a 89 y.o. male with medical history significant of hyperlipidemia, CAD, CABG, GERD, dementia, CKD-3A, atrial fibrillation not on anticoagulants (s/p Maze), prostate cancer, who presents with fall and right hip pain.  Per his wife at the bedside, pt fell accidentally when he was in the bedroom and got up to void, but did not turn his light. He fell on the right side, he injured his right hip.  No loss of consciousness.  No head or neck injury.  He developed pain in his right hip, which is severe, constant, sharp, nonradiating.  Patient does not have chest pain, cough, shortness breath.  No nausea vomiting, diarrhea or abdominal pain.  No symptoms of UTI.  Mental status is at baseline.  Data reviewed independently and ED Course: pt was found to have WBC 13.4, stable renal function, temperature normal, blood pressure 145/59, heart rate 81, RR 18, oxygen saturation 98% on room air.  CT of head is negative for acute intracranial abnormalities.  CT of C spine is negative for acute injury, but showed degenerative disc disease.  Patient initially placed on TeleMed follow-up patient, Dr. Mack Guise of Ortho is consulted.  X-ray of right hip: Acute minimally displaced subtrochanteric periprosthetic right femoral fractur  EKG: I have personally reviewed.  Sinus rhythm, QTc 444, T wave inversion in V1-V4.   Review of Systems:   General: no fevers, chills, no body weight gain, has fatigue HEENT: no blurry vision, hearing changes or sore throat Respiratory: no dyspnea, coughing, wheezing CV: no chest pain, no palpitations GI: no nausea, vomiting, abdominal pain, diarrhea, constipation GU: no dysuria, burning on urination,  increased urinary frequency, hematuria  Ext: no leg edema Neuro: no unilateral weakness, numbness, or tingling, no vision change or hearing loss. Has fall Skin: no rash, no skin tear. MSK: has right hip pain Heme: No easy bruising.  Travel history: No recent long distant travel.   Allergy:  Allergies  Allergen Reactions   Ambien [Zolpidem Tartrate] Other (See Comments)    Super sensitive, Hallucinations   Pravastatin Sodium     REACTION: muscle aches...crestor   Rosuvastatin Calcium     REACTION: muscle aches...crestor   Zolpidem Tartrate     Hallucinations    Past Medical History:  Diagnosis Date   Arthritis    Coronary artery disease    a. 1993 s/p PCI/BMS LAD;  b. 2003 PCI RCA;  c. 09/2006 ISR LAD->PCI, PCI RCA;  d. 02/2007 MV: no ischemia;  d. 01/2012 MV: EF 74%, no ischemia.   History of prostate cancer    Hyperlipidemia    Hyperthyroidism    Low testosterone    PAF (paroxysmal atrial fibrillation) ([place])    a. previously on amio ->stopped in 2013;  b. recurrent PAF 01/2012->Pradaxa and amio initiated, subsequently came off of pradaxa 2/2 cost;  c. 02/2012 Echo: EF 55-60%, Gr 1 DD.   Prostate cancer ([place])    S/P CABG x 4    Wears dentures    partial upper and lower    Past Surgical History:  Procedure Laterality Date   APPENDECTOMY     Dr. Bary Castilla   CARDIAC CATHETERIZATION  2008   CATARACT EXTRACTION  W/PHACO Left 07/07/2021   Procedure: CATARACT EXTRACTION PHACO AND INTRAOCULAR LENS PLACEMENT ([place]) LEFT;  Surgeon: Leandrew Koyanagi, MD;  Location: [place];  Service: Ophthalmology;  Laterality: Left;  9.52 1:16.2   CATARACT EXTRACTION W/PHACO Right 07/28/2021   Procedure: CATARACT EXTRACTION PHACO AND INTRAOCULAR LENS PLACEMENT ([place]) RIGHT  KAHOOK DUAL BLADE GONIOTOMY 7.63 01:01.0;  Surgeon: Leandrew Koyanagi, MD;  Location: [place];  Service: Ophthalmology;  Laterality: Right;   CLIPPING OF ATRIAL APPENDAGE Left 05/18/2012   Procedure:  CLIPPING OF ATRIAL APPENDAGE;  Surgeon: [name], MD;  Location: [place];  Service: Open Heart Surgery;  Laterality: Left;   COLONOSCOPY  2013   COLONOSCOPY WITH PROPOFOL N/A 07/12/2016   Procedure: COLONOSCOPY WITH PROPOFOL;  Surgeon: [name], MD;  Location: [facility] ENDOSCOPY;  Service: Endoscopy;  Laterality: N/A;   CORONARY ARTERY BYPASS GRAFT N/A 05/18/2012   Procedure: CORONARY ARTERY BYPASS GRAFTING (CABG);  Surgeon: [name], MD;  Location: [place];  Service: Open Heart Surgery;  Laterality: N/A;  Times 4 using left internal mammary artery and endoscopically harvested right saphenous vein   ESOPHAGOGASTRODUODENOSCOPY (EGD) WITH PROPOFOL N/A 10/24/2017   Procedure: ESOPHAGOGASTRODUODENOSCOPY (EGD) WITH PROPOFOL;  Surgeon: [name], MD;  Location: [facility] ENDOSCOPY;  Service: Endoscopy;  Laterality: N/A;   HERNIA REPAIR     s/p mesh bilaterally, Dr. Bary Castilla   INTRAOPERATIVE TRANSESOPHAGEAL ECHOCARDIOGRAM N/A 05/18/2012   Procedure: INTRAOPERATIVE TRANSESOPHAGEAL ECHOCARDIOGRAM;  Surgeon: [name], MD;  Location: [place];  Service: Open Heart Surgery;  Laterality: N/A;   IR ESOPHAGUS DILITATION RETRO FLUORO     LEFT HEART CATHETERIZATION WITH CORONARY ANGIOGRAM Bilateral 05/16/2012   Procedure: LEFT HEART CATHETERIZATION WITH CORONARY ANGIOGRAM;  Surgeon: Burnell Blanks, MD;  Location: [facility] CATH LAB;  Service: Cardiovascular;  Laterality: Bilateral;   MAZE N/A 05/18/2012   Procedure: MAZE;  Surgeon: [name], MD;  Location: [place];  Service: Open Heart Surgery;  Laterality: N/A;   MOHS SURGERY     PROSTATECTOMY     TOTAL HIP ARTHROPLASTY Right    Dr. [name]    Social History:  reports that he quit smoking about 33 years ago. His smoking use included cigars. He quit smokeless tobacco use about 30 years ago.  His smokeless tobacco use included chew. He reports current alcohol use. He reports that he does not use drugs.  Family History:  Family History  Problem Relation  Age of Onset   Coronary artery disease Mother    Heart disease Mother    Coronary artery disease Sister    Diabetes Sister    Heart disease Sister 40       deceased   Diabetes Father    Heart disease Brother      Prior to Admission medications   Medication Sig Start Date End Date Taking? Authorizing Provider  baclofen (LIORESAL) 10 MG tablet     [provider]  bisacodyl (DULCOLAX) 10 MG suppository Place 10 mg rectally daily as needed for moderate constipation.    [provider]  celecoxib (CELEBREX) 100 MG capsule TAKE ONE CAPSULE BY MOUTH TWICE A DAY 06/08/21   Karamalegos, Devonne Doughty, DO  Cholecalciferol (VITAMIN D3) 1.25 MG (50000 UT) CAPS Take 5,000 Units by mouth daily.    [provider]  dorzolamide-timolol (COSOPT) 22.3-6.8 MG/ML ophthalmic solution 1 drop 2 (two) times daily. 05/17/19   [provider]  ezetimibe (ZETIA) 10 MG tablet TAKE 1 TABLET BY MOUTH DAILY 09/29/21  Minna Merritts, MD  FERROUS SULFATE PO Take by mouth once a week.    [provider]  fluticasone (FLONASE) 50 MCG/ACT nasal spray SPRAY TWO SPRAYS IN EACH NOSTRIL ONCE DAILY - USE FOR 4-6 WEEKS THEN STOP AND USE SEASONALLY OR AS NEEDED 04/11/20   Karamalegos, Devonne Doughty, DO  latanoprost (XALATAN) 0.005 % ophthalmic solution 0.005 drops daily. 05/17/19   [provider]  meloxicam (MOBIC) 15 MG tablet     [provider]  meloxicam (MOBIC) 7.5 MG tablet  01/03/19   [provider]  pantoprazole (PROTONIX) 40 MG tablet     [provider]  polyethylene glycol powder (GLYCOLAX/MIRALAX) 17 GM/SCOOP powder Take 17-34 g by mouth daily. For constipation 07/18/19   [name], DO  SENNA PO Take by mouth daily.    [provider]  vitamin C (ASCORBIC ACID) 250 MG tablet Take 250 mg by mouth daily.    [provider]    Physical Exam: Vitals:   01/20/22 0853 01/20/22 0900 01/20/22 1128 01/20/22 1130  BP:  (!) 145/59 (!) 148/76  126/65  Pulse: 71 67  69  Resp: '12 11  13  '$ Temp:   98.6 F (37 C)   TempSrc:   Oral   SpO2: 96% 98%  99%  Weight:      Height:       General: Not in acute distress HEENT:       Eyes: PERRL, EOMI, no scleral icterus.       ENT: No discharge from the ears and nose, no pharynx injection, no tonsillar enlargement.        Neck: No JVD, no bruit, no mass felt. Heme: No neck lymph node enlargement. Cardiac: S1/S2, RRR, No murmurs, No gallops or rubs. Respiratory: No rales, wheezing, rhonchi or rubs. GI: Soft, nondistended, nontender, no rebound pain, no organomegaly, BS present. GU: No hematuria Ext: No pitting leg edema bilaterally. 1+DP/PT pulse bilaterally. Musculoskeletal: has tenderness in right hip Skin: No rashes.  Neuro: Alert, cranial nerves II-XII grossly intact, moves all extremities. Psych: Patient is not psychotic, no suicidal or hemocidal ideation.  Labs on Admission: I have personally reviewed following labs and imaging studies  CBC: Recent Labs  Lab 01/20/22 0840  WBC 13.4*  NEUTROABS 10.6*  HGB 10.7*  HCT 33.0*  MCV 85.5  PLT 027   Basic Metabolic Panel: Recent Labs  Lab 01/20/22 0840  NA 136  K 3.6  CL 104  CO2 23  GLUCOSE 136*  BUN 24*  CREATININE 1.24  CALCIUM 9.1   GFR: Estimated Creatinine Clearance: 36.3 mL/min (by C-G formula based on SCr of 1.24 mg/dL). Liver Function Tests: No results for input(s): "AST", "ALT", "ALKPHOS", "BILITOT", "PROT", "ALBUMIN" in the last 168 hours. No results for input(s): "LIPASE", "AMYLASE" in the last 168 hours. No results for input(s): "AMMONIA" in the last 168 hours. Coagulation Profile: No results for input(s): "INR", "PROTIME" in the last 168 hours. Cardiac Enzymes: No results for input(s): "CKTOTAL", "CKMB", "CKMBINDEX", "TROPONINI" in the last 168 hours. BNP (last 3 results) No results for input(s): "PROBNP" in the last 8760 hours. HbA1C: No results for input(s): "HGBA1C" in  the last 72 hours. CBG: No results for input(s): "GLUCAP" in the last 168 hours. Lipid Profile: No results for input(s): "CHOL", "HDL", "LDLCALC", "TRIG", "CHOLHDL", "LDLDIRECT" in the last 72 hours. Thyroid Function Tests: No results for input(s): "TSH", "T4TOTAL", "FREET4", "T3FREE", "THYROIDAB" in the last 72 hours. Anemia Panel: No results for  input(s): "VITAMINB12", "FOLATE", "FERRITIN", "TIBC", "IRON", "RETICCTPCT" in the last 72 hours. Urine analysis:    Component Value Date/Time   COLORURINE YELLOW (A) 11/04/2021 1753   APPEARANCEUR CLEAR (A) 11/04/2021 1753   APPEARANCEUR Clear 10/17/2018 1534   LABSPEC 1.013 11/04/2021 1753   LABSPEC 1.020 09/22/2013 1150   PHURINE 6.0 11/04/2021 1753   GLUCOSEU NEGATIVE 11/04/2021 1753   GLUCOSEU 50 mg/dL 09/22/2013 [place] 11/04/2021 1753   BILIRUBINUR NEGATIVE 11/04/2021 1753   BILIRUBINUR Negative 10/17/2018 1534   BILIRUBINUR Negative 09/22/2013 [place] 11/04/2021 1753   PROTEINUR NEGATIVE 11/04/2021 1753   UROBILINOGEN 1.0 05/17/2012 0450   NITRITE NEGATIVE 11/04/2021 1753   LEUKOCYTESUR NEGATIVE 11/04/2021 1753   LEUKOCYTESUR Trace 09/22/2013 1150   Sepsis Labs: '@LABRCNTIP'$ (procalcitonin:4,lacticidven:4) )No results found for this or any previous visit (from the past 240 hour(s)).   Radiological Exams on Admission: CT HEAD WO CONTRAST (5MM)  Result Date: 01/20/2022 CLINICAL DATA:  Unwitnessed fall EXAM: CT HEAD WITHOUT CONTRAST CT CERVICAL SPINE WITHOUT CONTRAST TECHNIQUE: Multidetector CT imaging of the head and cervical spine was performed following the standard protocol without intravenous contrast. Multiplanar CT image reconstructions of the cervical spine were also generated. RADIATION DOSE REDUCTION: This exam was performed according to the departmental dose-optimization program which includes automated exposure control, adjustment of the mA and/or kV according to patient size and/or use of  iterative reconstruction technique. COMPARISON:  11/04/2021 FINDINGS: CT HEAD FINDINGS Brain: Normal anatomic configuration. Parenchymal volume loss is commensurate with the patient's age. Stable mild periventricular white matter changes are present likely reflecting the sequela of small vessel ischemia. No abnormal intra or extra-axial mass lesion or fluid collection. No abnormal mass effect or midline shift. No evidence of acute intracranial hemorrhage or infarct. Stable borderline ventriculomegaly which appears commensurate with the degree of parenchymal volume loss and likely reflects ex vacuo dilation. Cerebellum unremarkable. Vascular: No asymmetric hyperdense vasculature at the skull base. Skull: Intact Sinuses/Orbits: Paranasal sinuses are clear. Orbits are unremarkable. Other: Mastoid air cells and middle ear cavities are clear. CT CERVICAL SPINE FINDINGS Alignment: Normal. Skull base and vertebrae: Craniocervical alignment is normal. The atlantodental interval is not widened. No acute fracture of the cervical spine. There is ankylosis of the C6-T1 vertebral bodies anteriorly. Soft tissues and spinal canal: No prevertebral fluid or swelling. No visible canal hematoma. Moderate atherosclerotic calcification within the carotid bifurcations. Disc levels: There is intervertebral disc space narrowing, calcification, and endplate remodeling throughout the cervical spine in keeping with changes of severe degenerative disc disease. Prevertebral soft tissues are not thickened on sagittal reformats. Spinal canal is widely patent. Multilevel uncovertebral and facet arthrosis results in multilevel moderate to severe neuroforaminal narrowing, most severe on the right at C4-5. Upper chest: Negative. Other: None IMPRESSION: 1. No acute intracranial abnormality. No calvarial fracture. 2. No acute fracture or listhesis of the cervical spine. 3. Advanced degenerative disc and degenerative joint disease resulting in  multilevel neuroforaminal narrowing, most severe on the right at C4-5. Electronically Signed   By: Fidela Salisbury M.D.   On: 01/20/2022 03:29   CT Cervical Spine Wo Contrast  Result Date: 01/20/2022 CLINICAL DATA:  Unwitnessed fall EXAM: CT HEAD WITHOUT CONTRAST CT CERVICAL SPINE WITHOUT CONTRAST TECHNIQUE: Multidetector CT imaging of the head and cervical spine was performed following the standard protocol without intravenous contrast. Multiplanar CT image reconstructions of the cervical spine were also generated. RADIATION DOSE REDUCTION: This exam was performed according to the departmental dose-optimization program  which includes automated exposure control, adjustment of the mA and/or kV according to patient size and/or use of iterative reconstruction technique. COMPARISON:  11/04/2021 FINDINGS: CT HEAD FINDINGS Brain: Normal anatomic configuration. Parenchymal volume loss is commensurate with the patient's age. Stable mild periventricular white matter changes are present likely reflecting the sequela of small vessel ischemia. No abnormal intra or extra-axial mass lesion or fluid collection. No abnormal mass effect or midline shift. No evidence of acute intracranial hemorrhage or infarct. Stable borderline ventriculomegaly which appears commensurate with the degree of parenchymal volume loss and likely reflects ex vacuo dilation. Cerebellum unremarkable. Vascular: No asymmetric hyperdense vasculature at the skull base. Skull: Intact Sinuses/Orbits: Paranasal sinuses are clear. Orbits are unremarkable. Other: Mastoid air cells and middle ear cavities are clear. CT CERVICAL SPINE FINDINGS Alignment: Normal. Skull base and vertebrae: Craniocervical alignment is normal. The atlantodental interval is not widened. No acute fracture of the cervical spine. There is ankylosis of the C6-T1 vertebral bodies anteriorly. Soft tissues and spinal canal: No prevertebral fluid or swelling. No visible canal hematoma. Moderate  atherosclerotic calcification within the carotid bifurcations. Disc levels: There is intervertebral disc space narrowing, calcification, and endplate remodeling throughout the cervical spine in keeping with changes of severe degenerative disc disease. Prevertebral soft tissues are not thickened on sagittal reformats. Spinal canal is widely patent. Multilevel uncovertebral and facet arthrosis results in multilevel moderate to severe neuroforaminal narrowing, most severe on the right at C4-5. Upper chest: Negative. Other: None IMPRESSION: 1. No acute intracranial abnormality. No calvarial fracture. 2. No acute fracture or listhesis of the cervical spine. 3. Advanced degenerative disc and degenerative joint disease resulting in multilevel neuroforaminal narrowing, most severe on the right at C4-5. Electronically Signed   By: Fidela Salisbury M.D.   On: 01/20/2022 03:29   DG Chest 1 View  Result Date: 01/20/2022 CLINICAL DATA:  Fall EXAM: CHEST  1 VIEW COMPARISON:  08/18/2021 FINDINGS: Lungs are clear. No pneumothorax or pleural effusion. Coronary artery bypass grafting and left atrial clipping has been performed. Cardiac size within normal limits. Pulmonary vascularity is normal. No acute bone abnormality. IMPRESSION: 1. No active disease. Electronically Signed   By: Fidela Salisbury M.D.   On: 01/20/2022 03:20   DG Hip Unilat W or Wo Pelvis 2-3 Views Right  Result Date: 01/20/2022 CLINICAL DATA:  Initial evaluation for acute trauma, fall. EXAM: DG HIP (WITH OR WITHOUT PELVIS) 2-3V RIGHT COMPARISON:  Prior study from 02/04/2014. FINDINGS: Bones are diffusely osteopenic, somewhat limiting assessment for fine osseous detail. Right total hip arthroplasty in place. There is an acute minimally displaced fracture extending through the subtrochanteric right femur. Femoral and acetabular components remain in place. No acute osseous abnormality about the visualized bony pelvis. Osteoarthritic changes noted about the  contralateral left hip. Mild soft tissue swelling about the fracture site. Scattered vascular clips noted about the pelvis. IMPRESSION: Acute minimally displaced subtrochanteric periprosthetic right femoral fracture. Electronically Signed   By: Jeannine Boga M.D.   On: 01/20/2022 03:11      Assessment/Plan Principal Problem:   Closed right hip fracture (HCC) Active Problems:   Fall at home, initial encounter   Hyperlipidemia   Chronic kidney disease, stage 3a (HCC)   Mild dementia ([place])   History of atrial fibrillation   CAD (coronary artery disease)   Protein-calorie malnutrition, moderate (HCC)   Leukocytosis   Assessment and Plan:   Closed right hip fracture ([place]): X-ray showed acute minimally displaced subtrochanteric periprosthetic right femoral fractur. Consulted Dr.  Krasinski of Ortho.  He "discussed this case with the trauma specialist at [facility] who have been turned discussed it with their joint specialist.  It has been recommended that the patient be treated nonoperatively for this fracture.  No surgery is scheduled at this time.  He may received physical therapy and will be 25% weightbearing on the right lower extremity with a walker.  He will be on trunk precautions".   - will admit to Med-surg bed - Pain control: prn morphine, percocet and tyleno - When necessary Zofran for nausea - Robaxin for muscle spasm - Lidoderm patch for pain - PT/OT  -TOC consult for rehab placement  Leukocytosis: WBC 13.4  Likely due to stress-induced demargination. Patient does not have signs of infection. -Follow-up CBC  Fall at home, initial encounter -Fall precaution -PT/OT  Hyperlipidemia -Zetia  Chronic kidney disease, stage 3a ([place]): stable. -f/u with BMP  Mild dementia ([place]) -fall precaution  History of atrial fibrillation: s/p of Maze. Not on AC. HR 81 -tele monitoring  CAD (coronary artery disease) -Zetia  Protein-calorie malnutrition, moderate  ([place]): BMI 17.8. BW 61.2 -Ensure -consult to nutrition      DVT ppx:  SQ Lovenox  Code Status: DNR per his wife who is POA  Family Communication:   Yes, patient's wife   at bed side.     Disposition Plan:  Anticipate discharge back to previous environment  Consults called:  Dr. Mack Guise of Ortho  Admission status and Level of care: Telemetry Medical:    for obs    Dispo: The patient is from: Home              Anticipated d/c is to:  to be determined, likley need rehab              Anticipated d/c date is: 1 day              Patient currently is not medically stable to d/c.    Severity of Illness:  The appropriate patient status for this patient is OBSERVATION. Observation status is judged to be reasonable and necessary in order to provide the required intensity of service to ensure the patient's safety. The patient's presenting symptoms, physical exam findings, and initial radiographic and laboratory data in the context of their medical condition is felt to place them at decreased risk for further clinical deterioration. Furthermore, it is anticipated that the patient will be medically stable for discharge from the hospital within 2 midnights of admission.        Date of Service 01/20/2022    Ivor Costa [facility]   If 7PM-7AM, please contact night-coverage [email or web address] 01/20/2022, 12:54 PM

## 2022-01-20 NOTE — ED Notes (Signed)
Pt attempted to take medication capsules and struggled with swallowing them. Crushed remaining medications and gave in a scoop of mash potatoes, and pt tolerated well. Pt and family prefer to have future crushable medications given in apple sauce or some type of food.

## 2022-01-20 NOTE — Consult Note (Signed)
Full consult to follow.  89 year old male with a history of fall.  He sustained a minimally displaced periprosthetic fracture around the right hip replacement.  I discussed this case with the trauma specialist at [facility] who have been turned discussed it with their joint specialist.  It has been recommended that the patient be treated nonoperatively for this fracture.  No surgery is scheduled at this time.  He may received physical therapy and will be 25% weightbearing on the right lower extremity with a walker.  He will be on trunk precautions.  Patient may eat today.  I will see the patient after my surgery.

## 2022-01-21 DIAGNOSIS — F02A2 Dementia in other diseases classified elsewhere, mild, with psychotic disturbance: Secondary | ICD-10-CM | POA: Diagnosis present

## 2022-01-21 DIAGNOSIS — Z951 Presence of aortocoronary bypass graft: Secondary | ICD-10-CM | POA: Diagnosis not present

## 2022-01-21 DIAGNOSIS — W1839XA Other fall on same level, initial encounter: Secondary | ICD-10-CM | POA: Diagnosis present

## 2022-01-21 DIAGNOSIS — S7221XA Displaced subtrochanteric fracture of right femur, initial encounter for closed fracture: Secondary | ICD-10-CM | POA: Diagnosis present

## 2022-01-21 DIAGNOSIS — M9701XA Periprosthetic fracture around internal prosthetic right hip joint, initial encounter: Secondary | ICD-10-CM | POA: Diagnosis present

## 2022-01-21 DIAGNOSIS — Z7189 Other specified counseling: Secondary | ICD-10-CM | POA: Diagnosis not present

## 2022-01-21 DIAGNOSIS — Z789 Other specified health status: Secondary | ICD-10-CM | POA: Diagnosis not present

## 2022-01-21 DIAGNOSIS — Y92002 Bathroom of unspecified non-institutional (private) residence single-family (private) house as the place of occurrence of the external cause: Secondary | ICD-10-CM | POA: Diagnosis not present

## 2022-01-21 DIAGNOSIS — Z8546 Personal history of malignant neoplasm of prostate: Secondary | ICD-10-CM | POA: Diagnosis not present

## 2022-01-21 DIAGNOSIS — Z66 Do not resuscitate: Secondary | ICD-10-CM | POA: Diagnosis present

## 2022-01-21 DIAGNOSIS — M50321 Other cervical disc degeneration at C4-C5 level: Secondary | ICD-10-CM | POA: Diagnosis present

## 2022-01-21 DIAGNOSIS — N1831 Chronic kidney disease, stage 3a: Secondary | ICD-10-CM | POA: Diagnosis present

## 2022-01-21 DIAGNOSIS — R34 Anuria and oliguria: Secondary | ICD-10-CM | POA: Diagnosis present

## 2022-01-21 DIAGNOSIS — E43 Unspecified severe protein-calorie malnutrition: Secondary | ICD-10-CM | POA: Diagnosis present

## 2022-01-21 DIAGNOSIS — M25551 Pain in right hip: Secondary | ICD-10-CM | POA: Diagnosis present

## 2022-01-21 DIAGNOSIS — Z955 Presence of coronary angioplasty implant and graft: Secondary | ICD-10-CM | POA: Diagnosis not present

## 2022-01-21 DIAGNOSIS — I2510 Atherosclerotic heart disease of native coronary artery without angina pectoris: Secondary | ICD-10-CM | POA: Diagnosis present

## 2022-01-21 DIAGNOSIS — Z8249 Family history of ischemic heart disease and other diseases of the circulatory system: Secondary | ICD-10-CM | POA: Diagnosis not present

## 2022-01-21 DIAGNOSIS — I480 Paroxysmal atrial fibrillation: Secondary | ICD-10-CM | POA: Diagnosis present

## 2022-01-21 DIAGNOSIS — D72829 Elevated white blood cell count, unspecified: Secondary | ICD-10-CM | POA: Diagnosis present

## 2022-01-21 DIAGNOSIS — G309 Alzheimer's disease, unspecified: Secondary | ICD-10-CM | POA: Diagnosis present

## 2022-01-21 DIAGNOSIS — Z515 Encounter for palliative care: Secondary | ICD-10-CM | POA: Diagnosis not present

## 2022-01-21 DIAGNOSIS — Z87891 Personal history of nicotine dependence: Secondary | ICD-10-CM | POA: Diagnosis not present

## 2022-01-21 DIAGNOSIS — F0390 Unspecified dementia without behavioral disturbance: Secondary | ICD-10-CM | POA: Diagnosis not present

## 2022-01-21 DIAGNOSIS — K219 Gastro-esophageal reflux disease without esophagitis: Secondary | ICD-10-CM | POA: Diagnosis present

## 2022-01-21 DIAGNOSIS — R7303 Prediabetes: Secondary | ICD-10-CM | POA: Diagnosis present

## 2022-01-21 DIAGNOSIS — S72001D Fracture of unspecified part of neck of right femur, subsequent encounter for closed fracture with routine healing: Secondary | ICD-10-CM | POA: Diagnosis not present

## 2022-01-21 DIAGNOSIS — E785 Hyperlipidemia, unspecified: Secondary | ICD-10-CM | POA: Diagnosis present

## 2022-01-21 DIAGNOSIS — R54 Age-related physical debility: Secondary | ICD-10-CM | POA: Diagnosis present

## 2022-01-21 DIAGNOSIS — Z681 Body mass index (BMI) 19 or less, adult: Secondary | ICD-10-CM | POA: Diagnosis not present

## 2022-01-21 DIAGNOSIS — S72001A Fracture of unspecified part of neck of right femur, initial encounter for closed fracture: Secondary | ICD-10-CM | POA: Diagnosis not present

## 2022-01-21 DIAGNOSIS — F02A11 Dementia in other diseases classified elsewhere, mild, with agitation: Secondary | ICD-10-CM | POA: Diagnosis present

## 2022-01-21 LAB — BASIC METABOLIC PANEL
Anion gap: 7 (ref 5–15)
BUN: 25 mg/dL — ABNORMAL HIGH (ref 8–23)
CO2: 25 mmol/L (ref 22–32)
Calcium: 9.3 mg/dL (ref 8.9–10.3)
Chloride: 105 mmol/L (ref 98–111)
Creatinine, Ser: 1.24 mg/dL (ref 0.61–1.24)
GFR, Estimated: 56 mL/min — ABNORMAL LOW (ref >60)
Glucose, Bld: 131 mg/dL — ABNORMAL HIGH (ref 70–99)
Potassium: 4.2 mmol/L (ref 3.5–5.1)
Sodium: 137 mmol/L (ref 135–145)

## 2022-01-21 LAB — CBC
HCT: 33.2 % — ABNORMAL LOW (ref 39.0–52.0)
Hemoglobin: 10.9 g/dL — ABNORMAL LOW (ref 13.0–17.0)
MCH: 27.9 pg (ref 26.0–34.0)
MCHC: 32.8 g/dL (ref 30.0–36.0)
MCV: 84.9 fL (ref 80.0–100.0)
Platelets: 205 10*3/uL (ref 150–400)
RBC: 3.91 MIL/uL — ABNORMAL LOW (ref 4.22–5.81)
RDW: 12.6 % (ref 11.5–15.5)
WBC: 13.3 10*3/uL — ABNORMAL HIGH (ref 4.0–10.5)
nRBC: 0 % (ref 0.0–0.2)

## 2022-01-21 MED ORDER — ADULT MULTIVITAMIN W/MINERALS CH
1.0000 | ORAL_TABLET | Freq: Every day | ORAL | Status: DC
  Administered 2022-01-21 – 2022-01-25 (×4): 1 via ORAL
  Filled 2022-01-21 (×4): qty 1

## 2022-01-21 MED ORDER — ENSURE ENLIVE PO LIQD
237.0000 mL | Freq: Three times a day (TID) | ORAL | Status: DC
  Administered 2022-01-21 – 2022-01-26 (×6): 237 mL via ORAL

## 2022-01-21 NOTE — Assessment & Plan Note (Signed)
-  Creatinine at baseline -Monitor renal function -Avoid nephrotoxin

## 2022-01-21 NOTE — Assessment & Plan Note (Signed)
Minimally displaced subtrochanteric periprosthetic right femoral fracture.  Orthopedic currently recommending conservative management after talking with trauma team and joint specialist at [facility].  Patient is high risk for surgical intervention. PT/OT are recommending SNF -Continue with conservative management -Continue with pain control

## 2022-01-21 NOTE — TOC Initial Note (Signed)
Transition of Care [facility] - Initial/Assessment Note    Patient Details  Name: [name] MRN: [id number] Date of Birth: [birth year]  Transition of Care [facility]) CM/SW Contact:    Quin Hoop, LCSW Phone Number: 01/21/2022, 3:14 PM  Clinical Narrative:     Assessment completed with pt's wife, Neoma Laming.  PCP is Dr. Parks Ranger.  He gets his medications at [facility] in [place].  He has no concerns about paying for meds.  Neoma Laming usually transport pt to medical appts.  SNF referrals started and list to be given to family when bed offers given.              Expected Discharge Plan: Skilled Nursing Facility Barriers to Discharge: Continued Medical Work up   Patient Goals and CMS Choice Patient states their goals for this hospitalization and ongoing recovery are:: to return home after rehab CMS Medicare.gov Compare Post Acute Care list provided to:: Patient Represenative (must comment) (facility to be generated and given to family) Choice offered to / list presented to : Spouse      Expected Discharge Plan and [place]   Living arrangements for the past 2 months: Single Family Home                                      Prior Living Arrangements/Services Living arrangements for the past 2 months: Single Family Home Lives with:: Spouse                   Activities of Daily Living Home Assistive Devices/Equipment: Environmental consultant (specify type), Eyeglasses ADL Screening (condition at time of admission) Patient's cognitive ability adequate to safely complete daily activities?: No Is the patient deaf or have difficulty hearing?: Yes Does the patient have difficulty seeing, even when wearing glasses/contacts?: No Does the patient have difficulty concentrating, remembering, or making decisions?: Yes Patient able to express need for assistance with ADLs?: Yes Does the patient have difficulty dressing or bathing?: Yes Independently performs  ADLs?: No Communication: Independent Dressing (OT): Needs assistance Is this a change from baseline?: Pre-admission baseline Grooming: Needs assistance Is this a change from baseline?: Pre-admission baseline Feeding: Independent Bathing: Needs assistance Is this a change from baseline?: Pre-admission baseline Toileting: Needs assistance Is this a change from baseline?: Pre-admission baseline In/Out Bed: Needs assistance Is this a change from baseline?: Pre-admission baseline Walks in Home: Needs assistance Is this a change from baseline?: Change from baseline, expected to last >3 days Does the patient have difficulty walking or climbing stairs?: Yes Weakness of Legs: Both Weakness of Arms/Hands: None  Permission Sought/Granted                  Emotional Assessment       Orientation: : Oriented to Self, Oriented to  Time Alcohol / Substance Use: Not Applicable Psych Involvement: No (comment)  Admission diagnosis:  Closed right hip fracture ([place]) [S72.001A] Fall, initial encounter [W19.XXXA] Periprosthetic subtrochanteric fracture of femur [id number], [place] Patient Active Problem List   Diagnosis Date Noted   Closed right hip fracture ([place]) 01/20/2022   Fall at home, initial encounter 01/20/2022   Chronic kidney disease, stage 3a ([place]) 01/20/2022   CAD (coronary artery disease) 01/20/2022   Leukocytosis 01/20/2022   Protein-calorie malnutrition, moderate ([place]) 01/20/2022   Generalized weakness 01/19/2022   Trochanteric bursitis of right hip 01/27/2021   Moderate protein-calorie malnutrition (HCC)  04/02/2020   Osteoarthritis of lumbar spine 05/30/2019   Paroxysmal atrial fibrillation ([place]) 05/17/2019   Chronic right-sided low back pain without sciatica 11/16/2018   Gastroesophageal reflux disease without esophagitis 12/19/2017   Esophageal dysphagia    Stricture and stenosis of esophagus    Mild dementia ([place]) 06/07/2017   Primary osteoarthritis involving  multiple joints 09/06/2016   Benign neoplasm of ascending colon    AAA (abdominal aortic aneurysm) without rupture (HCC) 04/17/2015   Constipation 09/23/2014   Chronic back pain 04/02/2014   Hardening of the aorta (main artery of the heart) ([place]) 04/02/2014   ED (erectile dysfunction) of organic origin 02/19/2013   Pre-diabetes 02/19/2013   Increased glucose level 02/19/2013   History of coronary artery bypass surgery 05/23/2012   S/P Maze operation for atrial fibrillation 05/23/2012   Postprocedural state 05/23/2012   Personal history of prostate cancer 09/06/2011   Male urinary stress incontinence 09/06/2011   Testicular hypofunction 06/22/2011   Insomnia 10/05/2010   Hyperlipidemia 06/17/2009   Benign hypertension with CKD (chronic kidney disease) stage III ([place]) 06/17/2009   Coronary arteriosclerosis in native artery 06/17/2009   History of atrial fibrillation 06/17/2009   Benign essential hypertension 06/17/2009   PCP:  [name], DO Pharmacy:   [facility] [id number] - [address], [address] [name] [address] Phone: [phone] Fax: [phone]     Social Determinants of Health (SDOH) Social History: SDOH Screenings   Food Insecurity: No Food Insecurity (01/20/2022)  Housing: Low Risk  (01/20/2022)  Transportation Needs: No Transportation Needs (01/20/2022)  Utilities: Not At Risk (01/20/2022)  Alcohol Screen: Low Risk  (08/13/2021)  Depression (PHQ2-9): High Risk (01/19/2022)  Financial Resource Strain: Low Risk  (08/13/2021)  Physical Activity: Insufficiently Active (08/13/2021)  Social Connections: Socially Isolated (08/13/2021)  Stress: No Stress Concern Present (08/13/2021)  Tobacco Use: Medium Risk (01/20/2022)   SDOH Interventions:     Readmission Risk Interventions     No data to display

## 2022-01-21 NOTE — Hospital Course (Addendum)
Taken from H&P.  [name] is a 89 y.o. male with medical history significant of hyperlipidemia, CAD, CABG, GERD, dementia, CKD-3A, atrial fibrillation not on anticoagulants (s/p Maze), prostate cancer, who presents with accidental fall while using the bathroom without turning on the light and right hip pain.  Rest of the ROS was negative  Data reviewed independently and ED Course: pt was found to have WBC 13.4, stable renal function, temperature normal, blood pressure 145/59, heart rate 81, RR 18, oxygen saturation 98% on room air.  CT of head is negative for acute intracranial abnormalities.  CT of C spine is negative for acute injury, but showed degenerative disc disease.  X-ray of right hip: Acute minimally displaced subtrochanteric periprosthetic right femoral fractur   EKG: I have personally reviewed.  Sinus rhythm, QTc 444, T wave inversion in V1-V4.  Consulted Dr. Mack Guise of Ortho.  He "discussed this case with the trauma specialist at [facility] who have been turned discussed it with their joint specialist.  It has been recommended that the patient be treated nonoperatively for this fracture.  No surgery is scheduled at this time.  He may received physical therapy and will be 25% weightbearing on the right lower extremity with a walker.  He will be on trunk precautions".   1/12: Remained hemodynamically stable.  Labs with stable BMP consistent with CKD stage IIIA, CBC with stable leukocytosis and hemoglobin.  PT/OT are recommending SNF. Patient has advanced dementia and oriented to self only at baseline.  1/13: Vital stable but patient was very somnolent and lethargic during morning rounds, received pain medications just before the shift change.  No urinary output recorded and PureWick canister hardly have couple of mL.  Ordered bladder scan, LR bolus 500 cc followed by 1 L of LR. Need strict intake and output.  P.o. intake remained poor. Nursing concern of food pocketing  and unable to swallow-swallow evaluation ordered. Bladder scan with more than 200 cc of urine, also ordered Flomax for concern of urinary retention.  Patient is high risk for deterioration and mortality based on age, advanced underlying dementia and recent hip fracture along with other comorbidities.  Palliative care consult.

## 2022-01-21 NOTE — Assessment & Plan Note (Signed)
Leukocytosis on admission most likely secondary to stress-induced demargination with fall and hip fracture.  Seems stable. -Continue to monitor

## 2022-01-21 NOTE — Assessment & Plan Note (Signed)
-  Continue Zetia

## 2022-01-21 NOTE — Progress Notes (Signed)
Subjective:  [name] is seen sitting in a chair today.  A male family member is at the bedside.  [name] denies significant right hip pain.  [name] has been admitted for a nondisplaced periprosthetic right hip fracture which will be treated nonoperatively.  Objective:   VITALS:   Vitals:   01/20/22 2000 01/20/22 2034 01/20/22 2101 01/21/22 0836  BP: (!) 154/66 (!) 162/73 (!) 141/82 105/65  Pulse:  78 75 71  Resp:  '16 15 18  '$ Temp:  (!) 100.6 F (38.1 C) 97.9 F (36.6 C) 98.5 F (36.9 C)  TempSrc:  Axillary    SpO2:  99% 99% 97%  Weight:      Height:        PHYSICAL EXAM: Right lower extremity Neurovascular intact Sensation intact distally Intact pulses distally Dorsiflexion/Plantar flexion intact No cellulitis present Compartment soft  LABS  Results for orders placed or performed during the hospital encounter of 01/20/22 (from the past 24 hour(s))  CBC     Status: Abnormal   Collection Time: 01/21/22  4:18 AM  Result Value Ref Range   WBC 13.3 (H) 4.0 - 10.5 K/uL   RBC 3.91 (L) 4.22 - 5.81 MIL/uL   Hemoglobin 10.9 (L) 13.0 - 17.0 g/dL   HCT 33.2 (L) 39.0 - 52.0 %   MCV 84.9 80.0 - 100.0 fL   MCH 27.9 26.0 - 34.0 pg   MCHC 32.8 30.0 - 36.0 g/dL   RDW 12.6 11.5 - 15.5 %   Platelets 205 150 - 400 K/uL   nRBC 0.0 0.0 - 0.2 %  Basic metabolic panel     Status: Abnormal   Collection Time: 01/21/22  4:18 AM  Result Value Ref Range   Sodium 137 135 - 145 mmol/L   Potassium 4.2 3.5 - 5.1 mmol/L   Chloride 105 98 - 111 mmol/L   CO2 25 22 - 32 mmol/L   Glucose, Bld 131 (H) 70 - 99 mg/dL   BUN 25 (H) 8 - 23 mg/dL   Creatinine, Ser 1.24 0.61 - 1.24 mg/dL   Calcium 9.3 8.9 - 10.3 mg/dL   GFR, Estimated 56 (L) >60 mL/min   Anion gap 7 5 - 15    CT HEAD WO CONTRAST (5MM)  Result Date: 01/20/2022 CLINICAL DATA:  Unwitnessed fall EXAM: CT HEAD WITHOUT CONTRAST CT CERVICAL SPINE WITHOUT CONTRAST TECHNIQUE: Multidetector CT imaging of the head and cervical spine  was performed following the standard protocol without intravenous contrast. Multiplanar CT image reconstructions of the cervical spine were also generated. RADIATION DOSE REDUCTION: This exam was performed according to the departmental dose-optimization program which includes automated exposure control, adjustment of the mA and/or kV according to [name] size and/or use of iterative reconstruction technique. COMPARISON:  11/04/2021 FINDINGS: CT HEAD FINDINGS Brain: Normal anatomic configuration. Parenchymal volume loss is commensurate with the [name]'s age. Stable mild periventricular white matter changes are present likely reflecting the sequela of small vessel ischemia. No abnormal intra or extra-axial mass lesion or fluid collection. No abnormal mass effect or midline shift. No evidence of acute intracranial hemorrhage or infarct. Stable borderline ventriculomegaly which appears commensurate with the degree of parenchymal volume loss and likely reflects ex vacuo dilation. Cerebellum unremarkable. Vascular: No asymmetric hyperdense vasculature at the skull base. Skull: Intact Sinuses/Orbits: Paranasal sinuses are clear. Orbits are unremarkable. Other: Mastoid air cells and middle ear cavities are clear. CT CERVICAL SPINE FINDINGS Alignment: Normal. Skull base and vertebrae: Craniocervical alignment is normal. The atlantodental interval  is not widened. No acute fracture of the cervical spine. There is ankylosis of the C6-T1 vertebral bodies anteriorly. Soft tissues and spinal canal: No prevertebral fluid or swelling. No visible canal hematoma. Moderate atherosclerotic calcification within the carotid bifurcations. Disc levels: There is intervertebral disc space narrowing, calcification, and endplate remodeling throughout the cervical spine in keeping with changes of severe degenerative disc disease. Prevertebral soft tissues are not thickened on sagittal reformats. Spinal canal is widely patent. Multilevel  uncovertebral and facet arthrosis results in multilevel moderate to severe neuroforaminal narrowing, most severe on the right at C4-5. Upper chest: Negative. Other: None IMPRESSION: 1. No acute intracranial abnormality. No calvarial fracture. 2. No acute fracture or listhesis of the cervical spine. 3. Advanced degenerative disc and degenerative joint disease resulting in multilevel neuroforaminal narrowing, most severe on the right at C4-5. Electronically Signed   By: Fidela Salisbury M.D.   On: 01/20/2022 03:29   CT Cervical Spine Wo Contrast  Result Date: 01/20/2022 CLINICAL DATA:  Unwitnessed fall EXAM: CT HEAD WITHOUT CONTRAST CT CERVICAL SPINE WITHOUT CONTRAST TECHNIQUE: Multidetector CT imaging of the head and cervical spine was performed following the standard protocol without intravenous contrast. Multiplanar CT image reconstructions of the cervical spine were also generated. RADIATION DOSE REDUCTION: This exam was performed according to the departmental dose-optimization program which includes automated exposure control, adjustment of the mA and/or kV according to [name] size and/or use of iterative reconstruction technique. COMPARISON:  11/04/2021 FINDINGS: CT HEAD FINDINGS Brain: Normal anatomic configuration. Parenchymal volume loss is commensurate with the [name]'s age. Stable mild periventricular white matter changes are present likely reflecting the sequela of small vessel ischemia. No abnormal intra or extra-axial mass lesion or fluid collection. No abnormal mass effect or midline shift. No evidence of acute intracranial hemorrhage or infarct. Stable borderline ventriculomegaly which appears commensurate with the degree of parenchymal volume loss and likely reflects ex vacuo dilation. Cerebellum unremarkable. Vascular: No asymmetric hyperdense vasculature at the skull base. Skull: Intact Sinuses/Orbits: Paranasal sinuses are clear. Orbits are unremarkable. Other: Mastoid air cells and middle ear  cavities are clear. CT CERVICAL SPINE FINDINGS Alignment: Normal. Skull base and vertebrae: Craniocervical alignment is normal. The atlantodental interval is not widened. No acute fracture of the cervical spine. There is ankylosis of the C6-T1 vertebral bodies anteriorly. Soft tissues and spinal canal: No prevertebral fluid or swelling. No visible canal hematoma. Moderate atherosclerotic calcification within the carotid bifurcations. Disc levels: There is intervertebral disc space narrowing, calcification, and endplate remodeling throughout the cervical spine in keeping with changes of severe degenerative disc disease. Prevertebral soft tissues are not thickened on sagittal reformats. Spinal canal is widely patent. Multilevel uncovertebral and facet arthrosis results in multilevel moderate to severe neuroforaminal narrowing, most severe on the right at C4-5. Upper chest: Negative. Other: None IMPRESSION: 1. No acute intracranial abnormality. No calvarial fracture. 2. No acute fracture or listhesis of the cervical spine. 3. Advanced degenerative disc and degenerative joint disease resulting in multilevel neuroforaminal narrowing, most severe on the right at C4-5. Electronically Signed   By: Fidela Salisbury M.D.   On: 01/20/2022 03:29   DG Chest 1 View  Result Date: 01/20/2022 CLINICAL DATA:  Fall EXAM: CHEST  1 VIEW COMPARISON:  08/18/2021 FINDINGS: Lungs are clear. No pneumothorax or pleural effusion. Coronary artery bypass grafting and left atrial clipping has been performed. Cardiac size within normal limits. Pulmonary vascularity is normal. No acute bone abnormality. IMPRESSION: 1. No active disease. Electronically Signed   By: Cassandria Anger  Christa See M.D.   On: 01/20/2022 03:20   DG Hip Unilat W or Wo Pelvis 2-3 Views Right  Result Date: 01/20/2022 CLINICAL DATA:  Initial evaluation for acute trauma, fall. EXAM: DG HIP (WITH OR WITHOUT PELVIS) 2-3V RIGHT COMPARISON:  Prior study from 02/04/2014. FINDINGS: Bones  are diffusely osteopenic, somewhat limiting assessment for fine osseous detail. Right total hip arthroplasty in place. There is an acute minimally displaced fracture extending through the subtrochanteric right femur. Femoral and acetabular components remain in place. No acute osseous abnormality about the visualized bony pelvis. Osteoarthritic changes noted about the contralateral left hip. Mild soft tissue swelling about the fracture site. Scattered vascular clips noted about the pelvis. IMPRESSION: Acute minimally displaced subtrochanteric periprosthetic right femoral fracture. Electronically Signed   By: Jeannine Boga M.D.   On: 01/20/2022 03:11    Assessment/Plan:     Principal Problem:   Closed right hip fracture (HCC) Active Problems:   Hyperlipidemia   History of atrial fibrillation   Mild dementia ([place])   Fall at home, initial encounter   Chronic kidney disease, stage 3a ([place])   CAD (coronary artery disease)   Leukocytosis   Protein-calorie malnutrition, moderate ([place])  [name] will be 25% partial weightbearing on the right lower extremity with trochanteric precautions.  I reviewed the PT and OT evaluations.  [name] is requiring max assist at this point.  [name] will require skilled nursing facility upon discharge.  Continue Lovenox for DVT prophylaxis.    Thornton Park , MD 01/21/2022, 1:44 PM

## 2022-01-21 NOTE — Assessment & Plan Note (Signed)
S/p of maze.  Not on any anticoagulation at home. -Continue to monitor

## 2022-01-21 NOTE — Assessment & Plan Note (Signed)
Patient is oriented to self only at baseline. -Delirium and fall precautions

## 2022-01-21 NOTE — Progress Notes (Signed)
Progress Note   Patient: [name] YCX:[id number] DOB: December 08, 1932 DOA: 01/20/2022     0 DOS: the patient was seen and examined on 01/21/2022   Brief hospital course: Taken from H&P.  [name] is a 89 y.o. male with medical history significant of hyperlipidemia, CAD, CABG, GERD, dementia, CKD-3A, atrial fibrillation not on anticoagulants (s/p Maze), prostate cancer, who presents with accidental fall while using the bathroom without turning on the light and right hip pain.  Rest of the ROS was negative  Data reviewed independently and ED Course: pt was found to have WBC 13.4, stable renal function, temperature normal, blood pressure 145/59, heart rate 81, RR 18, oxygen saturation 98% on room air.  CT of head is negative for acute intracranial abnormalities.  CT of C spine is negative for acute injury, but showed degenerative disc disease.  X-ray of right hip: Acute minimally displaced subtrochanteric periprosthetic right femoral fractur   EKG: I have personally reviewed.  Sinus rhythm, QTc 444, T wave inversion in V1-V4.  Consulted Dr. Mack Guise of Ortho.  He "discussed this case with the trauma specialist at [facility] who have been turned discussed it with their joint specialist.  It has been recommended that the patient be treated nonoperatively for this fracture.  No surgery is scheduled at this time.  He may received physical therapy and will be 25% weightbearing on the right lower extremity with a walker.  He will be on trunk precautions".   1/12: Remained hemodynamically stable.  Labs with stable BMP consistent with CKD stage IIIA, CBC with stable leukocytosis and hemoglobin.  PT/OT are recommending SNF. Patient has advanced dementia and oriented to self only at baseline.      Assessment and Plan: * Closed right hip fracture ([place]) Minimally displaced subtrochanteric periprosthetic right femoral fracture.  Orthopedic currently recommending conservative management  after talking with trauma team and joint specialist at [facility].  Patient is high risk for surgical intervention. PT/OT are recommending SNF -Continue with conservative management -Continue with pain control  Fall at home, initial encounter Resulted in above mentioned fracture. -Management as mentioned above -Fall precaution   Hyperlipidemia -Continue Zetia  Chronic kidney disease, stage 3a ([place]) -Creatinine at baseline -Monitor renal function -Avoid nephrotoxin  Mild dementia ([place]) Patient is oriented to self only at baseline. -Delirium and fall precautions  History of atrial fibrillation S/p of maze.  Not on any anticoagulation at home. -Continue to monitor  CAD (coronary artery disease) No chest pain. -Continue home Zetia  Leukocytosis Leukocytosis on admission most likely secondary to stress-induced demargination with fall and hip fracture.  Seems stable. -Continue to monitor  Protein-calorie malnutrition, severe Estimated body mass index is 17.8 kg/m as calculated from the following:   Height as of this encounter: '6\' 1"'$  (1.854 m).   Weight as of this encounter: 61.2 kg.   -Dietitian consult        Subjective: Patient was seen and examined today.  Sitting in chair after working with PT.  Experiencing some pain which increased with physical therapy.  Physical Exam: Vitals:   01/20/22 2034 01/20/22 2101 01/21/22 0836 01/21/22 1532  BP: (!) 162/73 (!) 141/82 105/65 127/61  Pulse: 78 75 71 68  Resp: '16 15 18 18  '$ Temp: (!) 100.6 F (38.1 C) 97.9 F (36.6 C) 98.5 F (36.9 C) 98.4 F (36.9 C)  TempSrc: Axillary     SpO2: 99% 99% 97% 98%  Weight:      Height:  General.  Frail and malnourished elderly man, in no acute distress. Pulmonary.  Lungs clear bilaterally, normal respiratory effort. CV.  Regular rate and rhythm, no JVD, rub or murmur. Abdomen.  Soft, nontender, nondistended, BS positive. CNS.  Alert and oriented .  No focal neurologic  deficit. Extremities.  No edema, no cyanosis, pulses intact and symmetrical. Psychiatry.  Judgment and insight appears impaired.  Data Reviewed: Prior data reviewed  Family Communication: Discussed with wife and daughter at bedside  Disposition: Status is: Inpatient Remains inpatient appropriate because: Severity of illness  Planned Discharge Destination: Skilled nursing facility  DVT prophylaxis.  Lovenox Time spent: 40 minutes  This record has been created using Systems analyst. Errors have been sought and corrected,but may not always be located. Such creation errors do not reflect on the standard of care.   Author: Lorella Nimrod, MD 01/21/2022 4:52 PM  For on call review [email or web address].

## 2022-01-21 NOTE — Assessment & Plan Note (Addendum)
Resulted in above mentioned fracture. -Management as mentioned above -Fall precaution

## 2022-01-21 NOTE — Progress Notes (Addendum)
Initial Nutrition Assessment  DOCUMENTATION CODES:   Underweight, Severe malnutrition in context of chronic illness  INTERVENTION:   -Liberalize diet to regular for wider variety of meal selections -MVI with minerals daily -Ensure Enlive po TID, each supplement provides 350 kcal and 20 grams of protein -Magic cup TID with meals, each supplement provides 290 kcal and 9 grams of protein   NUTRITION DIAGNOSIS:   Severe Malnutrition related to chronic illness (dementia) as evidenced by moderate fat depletion, severe fat depletion, moderate muscle depletion, severe muscle depletion.  GOAL:   Patient will meet greater than or equal to 90% of their needs  MONITOR:   PO intake, Supplement acceptance  REASON FOR ASSESSMENT:   Consult Assessment of nutrition requirement/status, Hip fracture protocol  ASSESSMENT:   Pt with medical history significant of hyperlipidemia, CAD, CABG, GERD, dementia, CKD-3A, atrial fibrillation not on anticoagulants (s/p Maze), prostate cancer, who presents with fall and right hip pain.  Pt admitted with closed rt hip fracture.   Reviewed I/O's: -300 ml x 24 hours  UOP: 300 ml x 24 hours  Per orthopedics notes, no plans for surgical interventions at this time.   Pt sitting up in recliner chair at time of visit. Pt not very conversant with this RD; daughter at bedside reports pt was just given pain medications and is a little sleepy. Daughter provided history. She reports pt has been eating very little this admission, but a few bites of breakfast and lunch as well as a few sips of Ensure. Pt denied offers of Ensure and Magic Cup at visit. Daughter reports that when pt wife is present she tries to offer him food and drink every 30 minutes to help optimize his intake.   Per daughter, pt had a decreased appetite PTA, however, unable to provide diet recall. Pt has difficulty chewing harder textured foods and is on a mechanical soft diet at home. Offered to  [employer] or downgrade to mechanical soft; daughter would like to liberalize diet to regular for wider variety of options. She shares that his wife will likely go to [employer] (his favorite restaurant) and being him dinner from there. Encouraged bringing outside food to help maximize his intake.   No recent wt loss noted, however, pt with distant history of weight loss.   Discussed importance of good meal and supplement intake to promote healing.   Medications reviewed and include vitamin C, vitamin D3, and senokot.   Lab Results  Component Value Date   HGBA1C 6.1 (H) 01/12/2022   PTA DM medications are none.   Labs reviewed: CBGS: 119 (inpatient orders for glycemic control are none).    NUTRITION - FOCUSED PHYSICAL EXAM:  Flowsheet Row Most Recent Value  Orbital Region Severe depletion  Upper Arm Region Severe depletion  Thoracic and Lumbar Region Moderate depletion  Buccal Region Severe depletion  Temple Region Severe depletion  Clavicle Bone Region Severe depletion  Clavicle and Acromion Bone Region Severe depletion  Scapular Bone Region Severe depletion  Dorsal Hand Severe depletion  Patellar Region Severe depletion  Anterior Thigh Region Severe depletion  Posterior Calf Region Severe depletion  Edema (RD Assessment) None  Hair Reviewed  Eyes Reviewed  Mouth Reviewed  Skin Reviewed  Nails Reviewed        Diet Order:   Diet Order             Diet regular Fluid consistency: Thin  Diet effective now  EDUCATION NEEDS:   Education needs have been addressed  Skin:  Skin Assessment: Reviewed RN Assessment  Last BM:  01/18/22  Height:   Ht Readings from Last 1 Encounters:  01/20/22 '6\' 1"'$  (1.854 m)    Weight:   Wt Readings from Last 1 Encounters:  01/20/22 61.2 kg    Ideal Body Weight:  80.9 kg  BMI:  Body mass index is 17.8 kg/m.  Estimated Nutritional Needs:   Kcal:  1950-2150  Protein:  105-120 grams  Fluid:  > 1.9  L    Loistine Chance, RD, LDN, [place] Registered Dietitian II Certified Diabetes Care and Education Specialist Please refer to [facility] for RD and/or RD on-call/weekend/after hours pager

## 2022-01-21 NOTE — Assessment & Plan Note (Signed)
No chest pain. -Continue home Zetia

## 2022-01-21 NOTE — Evaluation (Signed)
Physical Therapy Evaluation Patient Details Name: [name] MRN: [id number] DOB: 09/11/1932 Today's Date: 01/21/2022  History of Present Illness  Pt is an 13 y 72 M admitted on 01/20/22 after presenting with c/o fall & R hip pain. X-ray showed acute minimally displaced subtrochanteric periprosthetic right femoral fracture. Orthopedics have been consulted & it is recommended that pt be treated nonoperatively, PWB 25% on RLE at this time with trochanteric precautions (no active RLE hip abduction or external rotation). PMH: HLD, CAD, CABG, GERD, dementia, CKD 3A, a-fib not on anticoagulants (s/p Maze), prostate CA  Clinical Impression  Pt seen for PT evaluation with co-tx with OT for pt & therapists' safety. Pt's wife & daughter present in room reporting prior to admission pt was residing with his wife in a 1 level home with 3 steps with wide set B rails to enter, intermittently ambulating with assistance from wife. On this date, pt is oriented to self & able to recall family members names but with poor ability to follow single step commands with decreased overall awareness & decreased safety awareness. Pt tolerates supine>sit with total assist +2 & lateral scoot to recliner with total assist +2 with minimal c/o pain in RUE. Pt would benefit from STR upon d/c to maximize independence with functional mobility, decrease caregiver burden, & reduce fall risk prior to return home.     Recommendations for follow up therapy are one component of a multi-disciplinary discharge planning process, led by the attending physician.  Recommendations may be updated based on patient status, additional functional criteria and insurance authorization.  Follow Up Recommendations Skilled nursing-short term rehab (<3 hours/day) Can patient physically be transported by private vehicle: No    Assistance Recommended at Discharge Frequent or constant Supervision/Assistance  Patient can return home with the following  Two  people to help with walking and/or transfers;Two people to help with bathing/dressing/bathroom;Help with stairs or ramp for entrance;Direct supervision/assist for medications management;Assistance with feeding;Assist for transportation;Direct supervision/assist for financial management;Assistance with cooking/housework    Equipment Recommendations None recommended by PT (TBD in next venue)  Recommendations for Other Services       Functional Status Assessment Patient has had a recent decline in their functional status and demonstrates the ability to make significant improvements in function in a reasonable and predictable amount of time.     Precautions / Restrictions Precautions Precautions: Fall Precaution Comments: RLE trochanteric precautions (NO ABduction, no external rotation) Restrictions Weight Bearing Restrictions: Yes RLE Weight Bearing: Partial weight bearing RLE Partial Weight Bearing Percentage or Pounds: 25%      Mobility  Bed Mobility Overal bed mobility: Needs Assistance Bed Mobility: Supine to Sit     Supine to sit: Total assist, +2 for physical assistance, HOB elevated          Transfers Overall transfer level: Needs assistance   Transfers: Bed to chair/wheelchair/BSC            Lateral/Scoot Transfers: Total assist, +2 physical assistance General transfer comment: lateral scoot to L to drop arm recliner    Ambulation/Gait                  Stairs            Wheelchair Mobility    Modified Rankin (Stroke Patients Only)       Balance Overall balance assessment: Needs assistance Sitting-balance support: Feet supported, Single extremity supported Sitting balance-Leahy Scale: Poor Sitting balance - Comments: Pt able to hold self up with RUE on  bed rail wiht min assist, when sitting without no UE support pt with increased L lateral lean requiring up to mod assist for static sitting.                                      Pertinent Vitals/Pain Pain Assessment Pain Assessment: Faces Faces Pain Scale: Hurts little more Pain Location: R hip with movement but quickly subsides once pt is still Pain Descriptors / Indicators: Grimacing Pain Intervention(s): Monitored during session, Repositioned    Home Living Family/patient expects to be discharged to:: Private residence Living Arrangements: Spouse/significant other Available Help at Discharge: Family Type of Home: House Home Access: Stairs to enter Entrance Stairs-Rails: Right;Left (wideset) Entrance Stairs-Number of Steps: 3   Home Layout: One level   Additional Comments: walk in shower with shower chair    Prior Function Prior Level of Function : Needs assist  Cognitive Assist : Mobility (cognitive);ADLs (cognitive)     Physical Assist : Mobility (physical);ADLs (physical)     Mobility Comments: Pt requires intermittent HHA when ambulating outside of the home, moves very slowly, wife assists       Hand Dominance        Extremity/Trunk Assessment   Upper Extremity Assessment Upper Extremity Assessment: Generalized weakness;Difficult to assess due to impaired cognition    Lower Extremity Assessment Lower Extremity Assessment: Generalized weakness;Difficult to assess due to impaired cognition (Pt resting with RLE internally rotated & adducted, PT able to position RLE in neutral but pt with c/o pain when PT does this)    Cervical / Trunk Assessment Cervical / Trunk Assessment: Kyphotic (rounded shoulders, forward head)  Communication   Communication: No difficulties  Cognition Arousal/Alertness: Awake/alert Behavior During Therapy: WFL for tasks assessed/performed Overall Cognitive Status: History of cognitive impairments - at baseline                                 General Comments: Pt with hx of dementia, oriented to self, able to recall wife & daughter's name. Poor awareness re: situation, decreased safety  awareness & recall.        General Comments      Exercises     Assessment/Plan    PT Assessment Patient needs continued PT services  PT Problem List Decreased strength;Pain;Decreased range of motion;Decreased balance;Decreased mobility;Decreased knowledge of precautions;Decreased safety awareness;Decreased knowledge of use of DME;Decreased activity tolerance;Decreased cognition       PT Treatment Interventions DME instruction;Therapeutic exercise;Gait training;Balance training;Wheelchair mobility training;Neuromuscular re-education;Functional mobility training;Therapeutic activities;Patient/family education;Modalities    PT Goals (Current goals can be found in the Care Plan section)  Acute Rehab PT Goals Patient Stated Goal: decreased pain, get better, go to rehab, RLE heal PT Goal Formulation: With family Time For Goal Achievement: 02/04/22 Potential to Achieve Goals: Fair    Frequency 7X/week     Co-evaluation PT/OT/SLP Co-Evaluation/Treatment: Yes Reason for Co-Treatment: Complexity of the patient's impairments (multi-system involvement);Necessary to address cognition/behavior during functional activity;For patient/therapist safety;To address functional/ADL transfers PT goals addressed during session: Mobility/safety with mobility;Strengthening/ROM;Balance         AM-PAC PT "6 Clicks" Mobility  Outcome Measure Help needed turning from your back to your side while in a flat bed without using bedrails?: A Lot Help needed moving from lying on your back to sitting on the side of a flat bed without using  bedrails?: Total Help needed moving to and from a bed to a chair (including a wheelchair)?: Total Help needed standing up from a chair using your arms (e.g., wheelchair or bedside chair)?: Total Help needed to walk in hospital room?: Total Help needed climbing 3-5 steps with a railing? : Total 6 Click Score: 7    End of Session   Activity Tolerance: Patient tolerated  treatment well Patient left: in chair;with chair alarm set;with call bell/phone within reach;with family/visitor present Nurse Communication: Mobility status PT Visit Diagnosis: Muscle weakness (generalized) (M62.81);Difficulty in walking, not elsewhere classified (R26.2);Other abnormalities of gait and mobility (R26.89)    Time: 2081-3887 PT Time Calculation (min) (ACUTE ONLY): 22 min   Charges:   PT Evaluation $PT Eval High Complexity: 1 High          Lavone Nian, PT, DPT 01/21/22, 10:20 AM   Waunita Schooner 01/21/2022, 10:18 AM

## 2022-01-21 NOTE — Assessment & Plan Note (Signed)
Estimated body mass index is 17.8 kg/m as calculated from the following:   Height as of this encounter: '6\' 1"'$  (1.854 m).   Weight as of this encounter: 61.2 kg.   -Dietitian consult

## 2022-01-21 NOTE — Evaluation (Signed)
Occupational Therapy Evaluation Patient Details Name: [name] MRN: [id number] DOB: February 04, 1932 Today's Date: 01/21/2022   History of Present Illness Pt is an 40 y 29 M admitted on 01/20/22 after presenting with c/o fall & R hip pain. X-ray showed acute minimally displaced subtrochanteric periprosthetic right femoral fracture. Orthopedics have been consulted & it is recommended that pt be treated nonoperatively, PWB 25% on RLE at this time with trochanteric precautions (no active RLE hip abduction or external rotation). PMH: HLD, CAD, CABG, GERD, dementia, CKD 3A, a-fib not on anticoagulants (s/p Maze), prostate CA   Clinical Impression   [name] was seen for OT/PT co-evaluation this date. Prior to hospital admission, pt required assist for mobility and ADLs. Pt lives with spouse. Pt presents to acute OT demonstrating impaired ADL performance and functional mobility 2/2 decreased activity tolerance and functional strength/ROM/balance deficits. Pt currently requires TOTAL A x2 for bed mobility and bed>chair lateral scoot t/f. Maintains static sitting with single UE support MIN A, decreasing to MOD A with dynamic task. Attempted self feeding however difficulty sequencing task - spouse reports baseline pt requires variable assist with feeding depending on cognition. Pt denies pain at rest, faces 4/10 pain with mobility. Left in chair all needs in reach. Upon hospital discharge, recommend STR to maximize pt safety and return to PLOF.    Recommendations for follow up therapy are one component of a multi-disciplinary discharge planning process, led by the attending physician.  Recommendations may be updated based on patient status, additional functional criteria and insurance authorization.   Follow Up Recommendations  Skilled nursing-short term rehab (<3 hours/day)     Assistance Recommended at Discharge Frequent or constant Supervision/Assistance  Patient can return home with the following Two  people to help with walking and/or transfers;Two people to help with bathing/dressing/bathroom;Help with stairs or ramp for entrance    Functional Status Assessment  Patient has had a recent decline in their functional status and demonstrates the ability to make significant improvements in function in a reasonable and predictable amount of time.  Equipment Recommendations  Other (comment) (defer)    Recommendations for Other Services       Precautions / Restrictions Precautions Precautions: Fall Precaution Comments: RLE trochanteric precautions (NO ABduction, no external rotation) Restrictions Weight Bearing Restrictions: Yes RLE Weight Bearing: Partial weight bearing RLE Partial Weight Bearing Percentage or Pounds: 25% Other Position/Activity Restrictions: trochantic precautions      Mobility Bed Mobility Overal bed mobility: Needs Assistance Bed Mobility: Supine to Sit     Supine to sit: Total assist, +2 for physical assistance, HOB elevated          Transfers Overall transfer level: Needs assistance   Transfers: Bed to chair/wheelchair/BSC            Lateral/Scoot Transfers: Total assist, +2 physical assistance General transfer comment: lateral scoot to L to drop arm recliner      Balance Overall balance assessment: Needs assistance Sitting-balance support: Feet supported, Single extremity supported Sitting balance-Leahy Scale: Poor Sitting balance - Comments: Pt able to hold self up with RUE on bed rail wiht min assist, when sitting without no UE support pt with increased L lateral lean requiring up to mod assist for static sitting.                                   ADL either performed or assessed with clinical judgement   ADL  Overall ADL's : Needs assistance/impaired                                       General ADL Comments: MOD A x2 self-feeding, assist for balance and sequencing task. TOTAL A x2 simulated BSC t/f       Pertinent Vitals/Pain Pain Assessment Pain Assessment: Faces Faces Pain Scale: Hurts little more Pain Location: R hip with movement but quickly subsides once pt is still Pain Descriptors / Indicators: Grimacing Pain Intervention(s): Premedicated before session        Extremity/Trunk Assessment Upper Extremity Assessment Upper Extremity Assessment: Generalized weakness   Lower Extremity Assessment Lower Extremity Assessment: Generalized weakness   Cervical / Trunk Assessment Cervical / Trunk Assessment: Kyphotic (rounded shoulders, forward head)   Communication Communication Communication: No difficulties   Cognition Arousal/Alertness: Awake/alert Behavior During Therapy: WFL for tasks assessed/performed Overall Cognitive Status: History of cognitive impairments - at baseline                                 General Comments: Pt with hx of dementia, oriented to self, able to recall wife & daughter's name. Poor awareness re: situation, decreased safety awareness & recall. Eyes intermittently closed t/o session, suspect r/t recent pain medication - pt reports feeling drowsy                Home Living Family/patient expects to be discharged to:: Private residence Living Arrangements: Spouse/significant other Available Help at Discharge: Family Type of Home: House Home Access: Stairs to enter Technical brewer of Steps: 3 Entrance Stairs-Rails: Right;Left (wideset) Home Layout: One level                   Additional Comments: walk in shower with shower chair      Prior Functioning/Environment Prior Level of Function : Needs assist  Cognitive Assist : Mobility (cognitive);ADLs (cognitive)     Physical Assist : Mobility (physical);ADLs (physical)     Mobility Comments: Pt requires intermittent HHA when ambulating outside of the home, moves very slowly, wife assists          OT Problem List: Decreased strength;Decreased range of  motion;Decreased activity tolerance;Impaired balance (sitting and/or standing);Decreased cognition;Decreased safety awareness      OT Treatment/Interventions: Self-care/ADL training;Therapeutic exercise;Energy conservation;DME and/or AE instruction;Therapeutic activities;Patient/family education;Balance training    OT Goals(Current goals can be found in the care plan section) Acute Rehab OT Goals Patient Stated Goal: to improve pain OT Goal Formulation: With family Time For Goal Achievement: 02/04/22 Potential to Achieve Goals: Fair ADL Goals Pt Will Perform Grooming: with set-up;with supervision;sitting Pt Will Perform Lower Body Dressing: with min assist;sit to/from stand Pt Will Transfer to Toilet: with mod assist;with transfer board;with +2 assist;bedside commode  OT Frequency: Min 2X/week    Co-evaluation PT/OT/SLP Co-Evaluation/Treatment: Yes Reason for Co-Treatment: Complexity of the patient's impairments (multi-system involvement);Necessary to address cognition/behavior during functional activity PT goals addressed during session: Mobility/safety with mobility OT goals addressed during session: ADL's and self-care      AM-PAC OT "6 Clicks" Daily Activity     Outcome Measure Help from another person eating meals?: A Lot Help from another person taking care of personal grooming?: A Lot Help from another person toileting, which includes using toliet, bedpan, or urinal?: A Lot Help from another person bathing (including washing, rinsing,  drying)?: A Lot Help from another person to put on and taking off regular upper body clothing?: A Lot Help from another person to put on and taking off regular lower body clothing?: A Lot 6 Click Score: 12   End of Session Nurse Communication: Mobility status  Activity Tolerance: Patient tolerated treatment well Patient left: in chair;with call bell/phone within reach;with chair alarm set;with family/visitor present  OT Visit Diagnosis:  Other abnormalities of gait and mobility (R26.89);Muscle weakness (generalized) (M62.81)                Time: 2527-1292 OT Time Calculation (min): 23 min Charges:  OT General Charges $OT Visit: 1 Visit OT Evaluation $OT Eval Low Complexity: 1 Low OT Treatments $Self Care/Home Management : 8-22 mins  Dessie Coma, M.S. OTR/L  01/21/22, 10:59 AM  ascom [phone]

## 2022-01-22 DIAGNOSIS — S72001A Fracture of unspecified part of neck of right femur, initial encounter for closed fracture: Secondary | ICD-10-CM | POA: Diagnosis not present

## 2022-01-22 DIAGNOSIS — R34 Anuria and oliguria: Secondary | ICD-10-CM

## 2022-01-22 LAB — URINALYSIS, COMPLETE (UACMP) WITH MICROSCOPIC
Bacteria, UA: NONE SEEN
Bilirubin Urine: NEGATIVE
Glucose, UA: NEGATIVE mg/dL
Hgb urine dipstick: NEGATIVE
Ketones, ur: NEGATIVE mg/dL
Leukocytes,Ua: NEGATIVE
Nitrite: NEGATIVE
Protein, ur: NEGATIVE mg/dL
Specific Gravity, Urine: 1.018 (ref 1.005–1.030)
Squamous Epithelial / HPF: NONE SEEN /HPF (ref 0–5)
pH: 5 (ref 5.0–8.0)

## 2022-01-22 MED ORDER — TAMSULOSIN HCL 0.4 MG PO CAPS
0.4000 mg | ORAL_CAPSULE | Freq: Every day | ORAL | Status: DC
  Administered 2022-01-22 – 2022-01-26 (×4): 0.4 mg via ORAL
  Filled 2022-01-22 (×5): qty 1

## 2022-01-22 MED ORDER — LACTATED RINGERS IV BOLUS
500.0000 mL | Freq: Once | INTRAVENOUS | Status: AC
  Administered 2022-01-22: 500 mL via INTRAVENOUS

## 2022-01-22 MED ORDER — LACTATED RINGERS IV SOLN: INTRAVENOUS | Status: AC

## 2022-01-22 NOTE — Progress Notes (Signed)
Physical Therapy Treatment Patient Details Name: [name] MRN: [id number] DOB: Jan 31, 1932 Today's Date: 01/22/2022   History of Present Illness Pt is an 15 y 77 M admitted on 01/20/22 after presenting with c/o fall & R hip pain. X-ray showed acute minimally displaced subtrochanteric periprosthetic right femoral fracture. Orthopedics have been consulted & it is recommended that pt be treated nonoperatively, PWB 25% on RLE at this time with trochanteric precautions (no active RLE hip abduction or external rotation). PMH: HLD, CAD, CABG, GERD, dementia, CKD 3A, a-fib not on anticoagulants (s/p Maze), prostate CA    PT Comments    Pt was very lethargic and sleepy this session, pt's daughter reported pt having pain meds earlier.  Pt progressed with increased tolerance to gentle PROM for R LE hip, knee, and ankle.  Pt progressed with sit to stands with RW maintaining R PWB;  Pt required +2MaxA  (pillow under R foot to aid in [facility]) and was able to lift buttocks off bed but unable to fully extend upper trunk or LLE. Pt having to much difficulty staying awake to transfer to recliner this session.  Current PT D/C plan is still appropriate.    Recommendations for follow up therapy are one component of a multi-disciplinary discharge planning process, led by the attending physician.  Recommendations may be updated based on patient status, additional functional criteria and insurance authorization.  Follow Up Recommendations  Skilled nursing-short term rehab (<3 hours/day) Can patient physically be transported by private vehicle: No   Assistance Recommended at Discharge Frequent or constant Supervision/Assistance  Patient can return home with the following Two people to help with walking and/or transfers;Two people to help with bathing/dressing/bathroom;Help with stairs or ramp for entrance;Direct supervision/assist for medications management;Assistance with feeding;Assist for transportation;Direct  supervision/assist for financial management;Assistance with cooking/housework   Equipment Recommendations  None recommended by PT (TBD next venue)    Recommendations for Other Services       Precautions / Restrictions Precautions Precautions: Fall Precaution Comments: RLE trochanteric precautions (NO ABduction, no external rotation) Restrictions Weight Bearing Restrictions: Yes RLE Weight Bearing: Partial weight bearing RLE Partial Weight Bearing Percentage or Pounds: 25% Other Position/Activity Restrictions: trochantic precautions     Mobility  Bed Mobility Overal bed mobility: Needs Assistance Bed Mobility: Supine to Sit     Supine to sit: Total assist, +2 for physical assistance, HOB elevated          Transfers Overall transfer level: Needs assistance Equipment used: Rolling walker (2 wheels) Transfers: Sit to/from Stand Sit to Stand: Max assist, +2 physical assistance                Ambulation/Gait               General Gait Details: pt not sufficiently alert to attempt.   Stairs             Wheelchair Mobility    Modified Rankin (Stroke Patients Only)       Balance Overall balance assessment: Needs assistance Sitting-balance support: Feet supported, Single extremity supported Sitting balance-Leahy Scale: Poor Sitting balance - Comments: Pt able to hold self up with RUE on bed rail wiht min assist, when sitting without no UE support pt with increased L lateral lean requiring up to min assist for static sitting.  Cognition Arousal/Alertness: Awake/alert Behavior During Therapy: WFL for tasks assessed/performed Overall Cognitive Status: History of cognitive impairments - at baseline                                 General Comments: Pt with hx of dementia, oriented to self, able to recall wife & daughter's name. Poor awareness re: situation, decreased safety awareness &  recall.        Exercises Other Exercises Other Exercises: gentle PROM RLE for knee, hip and ankle, pt tolerated well.    General Comments        Pertinent Vitals/Pain Pain Assessment Pain Assessment: Faces Faces Pain Scale: Hurts even more Pain Location: R hip with movement but quickly subsides once pt is still Pain Descriptors / Indicators: Grimacing Pain Intervention(s): Premedicated before session, Monitored during session, Limited activity within patient's tolerance    Home Living                          Prior Function            PT Goals (current goals can now be found in the care plan section) Acute Rehab PT Goals Patient Stated Goal: decreased pain, get better, go to rehab, RLE heal PT Goal Formulation: With family Time For Goal Achievement: 02/04/22 Potential to Achieve Goals: Fair Additional Goals Additional Goal #1: Pt will propel w/c x 50 ft with min assist to increase independence with functional mobility. Progress towards PT goals: Progressing toward goals    Frequency    7X/week      PT Plan      Co-evaluation PT/OT/SLP Co-Evaluation/Treatment: Yes            AM-PAC PT "6 Clicks" Mobility   Outcome Measure  Help needed turning from your back to your side while in a flat bed without using bedrails?: A Lot Help needed moving from lying on your back to sitting on the side of a flat bed without using bedrails?: Total Help needed moving to and from a bed to a chair (including a wheelchair)?: Total Help needed standing up from a chair using your arms (e.g., wheelchair or bedside chair)?: Total Help needed to walk in hospital room?: Total Help needed climbing 3-5 steps with a railing? : Total 6 Click Score: 7    End of Session Equipment Utilized During Treatment: Gait belt Activity Tolerance: Patient tolerated treatment well Patient left: in bed;with family/visitor present Nurse Communication: Mobility status PT Visit Diagnosis:  Muscle weakness (generalized) (M62.81);Difficulty in walking, not elsewhere classified (R26.2);Other abnormalities of gait and mobility (R26.89)     Time: 2263-3354 PT Time Calculation (min) (ACUTE ONLY): 32 min  Charges:  $Therapeutic Activity: 23-37 mins                     Bjorn Loser, PTA  01/22/22, 3:15 PM

## 2022-01-22 NOTE — Assessment & Plan Note (Signed)
Minimally displaced subtrochanteric periprosthetic right femoral fracture.  Orthopedic currently recommending conservative management after talking with trauma team and joint specialist at [facility].  Patient is high risk for surgical intervention. PT/OT are recommending SNF-which was declined by insurance -Continue with conservative management -Continue with pain control

## 2022-01-22 NOTE — Progress Notes (Addendum)
Progress Note   Patient: [name] GMW:[id number] DOB: July 03, 1932 DOA: 01/20/2022     1 DOS: the patient was seen and examined on 01/22/2022   Brief hospital course: Taken from H&P.  [name] is a 89 y.o. male with medical history significant of hyperlipidemia, CAD, CABG, GERD, dementia, CKD-3A, atrial fibrillation not on anticoagulants (s/p Maze), prostate cancer, who presents with accidental fall while using the bathroom without turning on the light and right hip pain.  Rest of the ROS was negative  Data reviewed independently and ED Course: pt was found to have WBC 13.4, stable renal function, temperature normal, blood pressure 145/59, heart rate 81, RR 18, oxygen saturation 98% on room air.  CT of head is negative for acute intracranial abnormalities.  CT of C spine is negative for acute injury, but showed degenerative disc disease.  X-ray of right hip: Acute minimally displaced subtrochanteric periprosthetic right femoral fractur   EKG: I have personally reviewed.  Sinus rhythm, QTc 444, T wave inversion in V1-V4.  Consulted Dr. Mack Guise of Ortho.  He "discussed this case with the trauma specialist at [facility] who have been turned discussed it with their joint specialist.  It has been recommended that the patient be treated nonoperatively for this fracture.  No surgery is scheduled at this time.  He may received physical therapy and will be 25% weightbearing on the right lower extremity with a walker.  He will be on trunk precautions".   1/12: Remained hemodynamically stable.  Labs with stable BMP consistent with CKD stage IIIA, CBC with stable leukocytosis and hemoglobin.  PT/OT are recommending SNF. Patient has advanced dementia and oriented to self only at baseline.  1/13: Vital stable but patient was very somnolent and lethargic during morning rounds, received pain medications just before the shift change.  No urinary output recorded and PureWick canister hardly  have couple of mL.  Ordered bladder scan, LR bolus 500 cc followed by 1 L of LR. Need strict intake and output.  P.o. intake remained poor. Nursing concern of food pocketing and unable to swallow-swallow evaluation ordered. Bladder scan with more than 200 cc of urine, also ordered Flomax for concern of urinary retention.  Patient is high risk for deterioration and mortality based on age, advanced underlying dementia and recent hip fracture along with other comorbidities.  Palliative care consult.    Assessment and Plan: * Closed right hip fracture ([place]) Minimally displaced subtrochanteric periprosthetic right femoral fracture.  Orthopedic currently recommending conservative management after talking with trauma team and joint specialist at [facility].  Patient is high risk for surgical intervention. PT/OT are recommending SNF -Continue with conservative management -Continue with pain control  Oliguria and anuria Patient with very low urinary output.  Poor p.o. intake. -500 mL LR bolus followed by LR at 100 mL/h for another 1 L. -Strict intake and output -Bladder scan to rule out any urinary retention  Fall at home, initial encounter Resulted in above mentioned fracture. -Management as mentioned above -Fall precaution   Hyperlipidemia -Continue Zetia  Chronic kidney disease, stage 3a ([place]) -Creatinine at baseline -Monitor renal function -Avoid nephrotoxin  Mild dementia ([place]) Patient is oriented to self only at baseline. -Delirium and fall precautions  History of atrial fibrillation S/p of maze.  Not on any anticoagulation at home. -Continue to monitor  CAD (coronary artery disease) No chest pain. -Continue home Zetia  Leukocytosis Leukocytosis on admission most likely secondary to stress-induced demargination with fall and hip  fracture.  Seems stable. -Continue to monitor  Protein-calorie malnutrition, severe Estimated body mass index is 17.8 kg/m as calculated  from the following:   Height as of this encounter: '6\' 1"'$  (1.854 m).   Weight as of this encounter: 61.2 kg.   -Dietitian consult        Subjective: Patient was somnolent and resting comfortably when seen today.  Wife was concern about urine output.  There was hardly any urine in the canister and after inquiring with nursing staff it was last emptied and day before around 3 PM.  No other urinary output recorded.  Physical Exam: Vitals:   01/21/22 0836 01/21/22 1532 01/22/22 0044 01/22/22 0839  BP: 105/65 127/61 (!) 141/76 (!) 118/59  Pulse: 71 68 72 72  Resp: '18 18 18 16  '$ Temp: 98.5 F (36.9 C) 98.4 F (36.9 C) 98.4 F (36.9 C) 98.9 F (37.2 C)  TempSrc:      SpO2: 97% 98% 100% 98%  Weight:      Height:       General.  Frail and lethargic elderly man, in no acute distress. Pulmonary.  Lungs clear bilaterally, normal respiratory effort. CV.  Regular rate and rhythm, no JVD, rub or murmur. Abdomen.  Soft, nontender, nondistended, BS positive. CNS.  Somnolent, no apparent focal deficit Extremities.  No edema, no cyanosis, pulses intact and symmetrical. Psychiatry.  Judgment and insight appears impaired.  Data Reviewed: Prior data reviewed  Family Communication: Discussed with wife at bedside  Disposition: Status is: Inpatient Remains inpatient appropriate because: Severity of illness  Planned Discharge Destination: Skilled nursing facility  DVT prophylaxis.  Lovenox Time spent: 44 minutes  This record has been created using Systems analyst. Errors have been sought and corrected,but may not always be located. Such creation errors do not reflect on the standard of care.   Author: Lorella Nimrod, MD 01/22/2022 2:43 PM  For on call review [email or web address].

## 2022-01-22 NOTE — Plan of Care (Signed)

## 2022-01-22 NOTE — Assessment & Plan Note (Signed)
Some improvement in urinary output after getting some fluid. -Giving some more fluid for another 20-hour -Continue with strict intake and output

## 2022-01-22 NOTE — Progress Notes (Signed)
  Subjective:  Patient with nondisplaced right periprosthetic fracture around right total hip arthroplasty.  Patient with dementia.  Patient complains of mild to moderate hip pain.  His wife is at the bedside.  She is concerned about his urine output given his underlying kidney disease.  Objective:   VITALS:   Vitals:   01/21/22 0836 01/21/22 1532 01/22/22 0044 01/22/22 0839  BP: 105/65 127/61 (!) 141/76 (!) 118/59  Pulse: 71 68 72 72  Resp: '18 18 18 16  '$ Temp: 98.5 F (36.9 C) 98.4 F (36.9 C) 98.4 F (36.9 C) 98.9 F (37.2 C)  TempSrc:      SpO2: 97% 98% 100% 98%  Weight:      Height:        PHYSICAL EXAM: Right lower extremity Neurovascular intact Sensation intact distally Intact pulses distally Dorsiflexion/Plantar flexion intact No cellulitis present Compartment soft  LABS  No results found for this or any previous visit (from the past 24 hour(s)).  No results found.  Assessment/Plan:     Principal Problem:   Closed right hip fracture (HCC) Active Problems:   Hyperlipidemia   History of atrial fibrillation   Mild dementia ([place])   Fall at home, initial encounter   Chronic kidney disease, stage 3a ([place])   CAD (coronary artery disease)   Leukocytosis   Protein-calorie malnutrition, severe  Patient will be 25% partial weightbearing on the right lower extremity with trochanteric precautions.  I reviewed the PT and OT evaluations.  Patient is requiring max assist at this point.  Patient will require skilled nursing facility upon discharge.  Continue Lovenox for DVT prophylaxis.     Thornton Park , MD 01/22/2022, 9:47 AM

## 2022-01-23 ENCOUNTER — Encounter: Payer: Self-pay | Admitting: Family Medicine

## 2022-01-23 DIAGNOSIS — S72001A Fracture of unspecified part of neck of right femur, initial encounter for closed fracture: Secondary | ICD-10-CM | POA: Diagnosis not present

## 2022-01-23 LAB — CBC
HCT: 28.3 % — ABNORMAL LOW (ref 39.0–52.0)
Hemoglobin: 9.3 g/dL — ABNORMAL LOW (ref 13.0–17.0)
MCH: 27.8 pg (ref 26.0–34.0)
MCHC: 32.9 g/dL (ref 30.0–36.0)
MCV: 84.5 fL (ref 80.0–100.0)
Platelets: 186 10*3/uL (ref 150–400)
RBC: 3.35 MIL/uL — ABNORMAL LOW (ref 4.22–5.81)
RDW: 12.2 % (ref 11.5–15.5)
WBC: 10.9 10*3/uL — ABNORMAL HIGH (ref 4.0–10.5)
nRBC: 0 % (ref 0.0–0.2)

## 2022-01-23 LAB — BASIC METABOLIC PANEL
Anion gap: 9 (ref 5–15)
BUN: 36 mg/dL — ABNORMAL HIGH (ref 8–23)
CO2: 23 mmol/L (ref 22–32)
Calcium: 9 mg/dL (ref 8.9–10.3)
Chloride: 103 mmol/L (ref 98–111)
Creatinine, Ser: 1.41 mg/dL — ABNORMAL HIGH (ref 0.61–1.24)
GFR, Estimated: 48 mL/min — ABNORMAL LOW (ref >60)
Glucose, Bld: 121 mg/dL — ABNORMAL HIGH (ref 70–99)
Potassium: 3.9 mmol/L (ref 3.5–5.1)
Sodium: 135 mmol/L (ref 135–145)

## 2022-01-23 MED ORDER — HYDROXYZINE HCL 10 MG PO TABS
10.0000 mg | ORAL_TABLET | Freq: Three times a day (TID) | ORAL | Status: DC | PRN
  Filled 2022-01-23: qty 1

## 2022-01-23 MED ORDER — FERROUS SULFATE 325 (65 FE) MG PO TABS
325.0000 mg | ORAL_TABLET | Freq: Every day | ORAL | Status: DC
  Administered 2022-01-25 – 2022-01-26 (×2): 325 mg via ORAL
  Filled 2022-01-23 (×2): qty 1

## 2022-01-23 MED ORDER — LACTATED RINGERS IV SOLN: INTRAVENOUS | Status: AC

## 2022-01-23 NOTE — Progress Notes (Signed)
SLP Cancellation Note  Patient Details Name: [name] MRN: [id number] DOB: [birth year]   Cancelled treatment:       Reason Eval/Treat Not Completed: SLP screened, no needs identified, will sign off   SLP consult received and appreciated. Chart review completed. Noted hx of dysphagia. Pt s/p esophageal dilatation 12/19/2017 due to distal esophageal stricture. Additionally,  MBSS completed 03/16/20 noted a mild-moderate structural pharyngeal and pharyngoesophageal dysphagia. A mech soft diet with thin liquids with safe swallowing strategies/aspiration precautions/reflux precautions were recommended at that time.   Per chart review, temp WNL and WBC trending downward. CXR, 01/20/22 "1. No active disease."  Pt found sleeping in recliner. Wife at bedside. Spoke with pt's wife at length re: pt's hx of dysphagia, diet and safe swallowing strategies/aspiration precautions/aspiration precautions pt utlized PTA, and pt's CLOF. Pt's wife noted pt tolerate "soft" food with thin liquids without overt s/sx pharyngeal dysphagia with use of safe swallowing strategies/aspiration precautions/reflux precautions. Wife well versed in risk factors related to dysphagia as well as compensatory strategies. Wife denies any new changes to swallow function and denies need for further swallowing evaluation.   SLP to sign off given the above.   Time spent with pt/wife: [id number]  [name] 01/23/2022, 10:19 AM

## 2022-01-23 NOTE — Plan of Care (Signed)
  Problem: Health Behavior/Discharge Planning: Goal: Ability to manage health-related needs will improve Outcome: Progressing   Problem: Clinical Measurements: Goal: Ability to maintain clinical measurements within normal limits will improve Outcome: Progressing Goal: Will remain free from infection Outcome: Progressing   Problem: Activity: Goal: Risk for activity intolerance will decrease Outcome: Progressing   Problem: Coping: Goal: Level of anxiety will decrease Outcome: Progressing   Problem: Elimination: Goal: Will not experience complications related to bowel motility Outcome: Progressing Goal: Will not experience complications related to urinary retention Outcome: Progressing

## 2022-01-23 NOTE — Progress Notes (Signed)
  Subjective:  Patient with nondisplaced right periprosthetic fracture around right total hip arthroplasty.  Patient with dementia.  Patient is resting comfortably this morning, although wife stated he was having discomfort last night with repositioning in bed.  He was able to sit up in bed with PT yesterday.  Patient's wife remains concerned about his urine output given his underlying kidney disease.  Objective:   VITALS:   Vitals:   01/22/22 0839 01/22/22 1728 01/22/22 1809 01/22/22 2323  BP: (!) 118/59 (!) 175/77 133/62 129/75  Pulse: 72 74  85  Resp: '16 18  18  '$ Temp: 98.9 F (37.2 C) 98 F (36.7 C)  98.1 F (36.7 C)  TempSrc:      SpO2: 98% 100%  100%  Weight:      Height:        PHYSICAL EXAM: Right lower extremity Neurovascular intact Sensation intact distally Intact pulses distally Dorsiflexion/Plantar flexion intact No cellulitis present Compartment soft  LABS  Results for orders placed or performed during the hospital encounter of 01/20/22 (from the past 24 hour(s))  Urinalysis, Complete w Microscopic Urine, Catheterized     Status: Abnormal   Collection Time: 01/22/22  6:47 PM  Result Value Ref Range   Color, Urine YELLOW (A) YELLOW   APPearance HAZY (A) CLEAR   Specific Gravity, Urine 1.018 1.005 - 1.030   pH 5.0 5.0 - 8.0   Glucose, UA NEGATIVE NEGATIVE mg/dL   Hgb urine dipstick NEGATIVE NEGATIVE   Bilirubin Urine NEGATIVE NEGATIVE   Ketones, ur NEGATIVE NEGATIVE mg/dL   Protein, ur NEGATIVE NEGATIVE mg/dL   Nitrite NEGATIVE NEGATIVE   Leukocytes,Ua NEGATIVE NEGATIVE   RBC / HPF 0-5 0 - 5 RBC/hpf   WBC, UA 0-5 0 - 5 WBC/hpf   Bacteria, UA NONE SEEN NONE SEEN   Squamous Epithelial / HPF NONE SEEN 0 - 5 /HPF   Hyaline Casts, UA PRESENT     No results found.  Assessment/Plan:     Principal Problem:   Closed right hip fracture (HCC) Active Problems:   Hyperlipidemia   History of atrial fibrillation   Mild dementia ([place])   Fall at home,  initial encounter   Chronic kidney disease, stage 3a ([place])   CAD (coronary artery disease)   Leukocytosis   Protein-calorie malnutrition, severe   Oliguria and anuria  Patient will be 25% partial weightbearing on the right lower extremity with trochanteric precautions. Patient is requiring max assist at this point.  Patient will require skilled nursing facility upon discharge.  Continue Lovenox for DVT prophylaxis.     Renee Harder , MD 01/23/2022, 7:43 AM

## 2022-01-23 NOTE — Progress Notes (Signed)
Progress Note   Patient: [name] GBT:[id number] DOB: [birth year] DOA: 01/20/2022     2 DOS: the patient was seen and examined on 01/23/2022   Brief hospital course: Taken from H&P.  [name] is a 89 y.o. male with medical history significant of hyperlipidemia, CAD, CABG, GERD, dementia, CKD-3A, atrial fibrillation not on anticoagulants (s/p Maze), prostate cancer, who presents with accidental fall while using the bathroom without turning on the light and right hip pain.  Rest of the ROS was negative  Data reviewed independently and ED Course: pt was found to have WBC 13.4, stable renal function, temperature normal, blood pressure 145/59, heart rate 81, RR 18, oxygen saturation 98% on room air.  CT of head is negative for acute intracranial abnormalities.  CT of C spine is negative for acute injury, but showed degenerative disc disease.  X-ray of right hip: Acute minimally displaced subtrochanteric periprosthetic right femoral fractur   EKG: I have personally reviewed.  Sinus rhythm, QTc 444, T wave inversion in V1-V4.  Consulted Dr. Mack Guise of Ortho.  He "discussed this case with the trauma specialist at [facility] who have been turned discussed it with their joint specialist.  It has been recommended that the patient be treated nonoperatively for this fracture.  No surgery is scheduled at this time.  He may received physical therapy and will be 25% weightbearing on the right lower extremity with a walker.  He will be on trunk precautions".   1/12: Remained hemodynamically stable.  Labs with stable BMP consistent with CKD stage IIIA, CBC with stable leukocytosis and hemoglobin.  PT/OT are recommending SNF. Patient has advanced dementia and oriented to self only at baseline.  1/13: Vital stable but patient was very somnolent and lethargic during morning rounds, received pain medications just before the shift change.  No urinary output recorded and PureWick canister hardly  have couple of mL.  Ordered bladder scan, LR bolus 500 cc followed by 1 L of LR. Need strict intake and output.  P.o. intake remained poor. Nursing concern of food pocketing and unable to swallow-swallow evaluation ordered. Bladder scan with more than 200 cc of urine, also ordered Flomax for concern of urinary retention.  1/14: Total recorded urinary output of 380 ML in 24-hour.  P.o. intake remained poor.  Received about 2 L of fluid yesterday, giving 2 more liters of fluid at 100 mL/h.  Slight worsening of creatinine to 1.41, still within baseline creatinine of 1.3-1.5. Wife would like to see how he does with short-term rehab, if not showing much improvement then she will consider taking him back home with hospice help.  Awaiting SNF placement.  Patient is high risk for deterioration and mortality based on age, advanced underlying dementia and recent hip fracture along with other comorbidities.  Palliative care consult.    Assessment and Plan: * Closed right hip fracture ([place]) Minimally displaced subtrochanteric periprosthetic right femoral fracture.  Orthopedic currently recommending conservative management after talking with trauma team and joint specialist at [facility].  Patient is high risk for surgical intervention. PT/OT are recommending SNF -Continue with conservative management -Continue with pain control  Oliguria and anuria Some improvement in urinary output after getting some fluid. -Giving some more fluid for another 20-hour -Continue with strict intake and output  Fall at home, initial encounter Resulted in above mentioned fracture. -Management as mentioned above -Fall precaution   Hyperlipidemia -Continue Zetia  Chronic kidney disease, stage 3a (HCC) -Creatinine at baseline of 1.3-1.5,  slight worsening today but remained within baseline -Monitor renal function -Avoid nephrotoxin  Mild dementia ([place]) Patient is oriented to self only at baseline. -Delirium and  fall precautions  History of atrial fibrillation S/p of maze.  Not on any anticoagulation at home. -Continue to monitor  CAD (coronary artery disease) No chest pain. -Continue home Zetia  Leukocytosis Leukocytosis on admission most likely secondary to stress-induced demargination with fall and hip fracture.  Seems stable. -Continue to monitor  Protein-calorie malnutrition, severe Estimated body mass index is 17.8 kg/m as calculated from the following:   Height as of this encounter: '6\' 1"'$  (1.854 m).   Weight as of this encounter: 61.2 kg.   -Dietitian consult        Subjective: Patient was little more alert and sitting in chair when seen today.  Denies any pain.  P.o. intake remained poor.  Had a long discussion with wife who would like to give him a chance of working with PT and a short-term rehab setting and if sees no improvement then they might consider taking him back home with hospice.  Physical Exam: Vitals:   01/22/22 1728 01/22/22 1809 01/22/22 2323 01/23/22 0823  BP: (!) 175/77 133/62 129/75 (!) 148/69  Pulse: 74  85 70  Resp: '18  18 17  '$ Temp: 98 F (36.7 C)  98.1 F (36.7 C) 98.5 F (36.9 C)  TempSrc:      SpO2: 100%  100% 99%  Weight:      Height:       General.  Frail elderly man, in no acute distress. Pulmonary.  Lungs clear bilaterally, normal respiratory effort. CV.  Regular rate and rhythm, no JVD, rub or murmur. Abdomen.  Soft, nontender, nondistended, BS positive. CNS.  Alert and oriented to self only.  No focal neurologic deficit. Extremities.  No edema, no cyanosis, pulses intact and symmetrical. Psychiatry.  Judgment and insight appears impaired  Data Reviewed: Prior data reviewed  Family Communication: Discussed with wife at bedside  Disposition: Status is: Inpatient Remains inpatient appropriate because: Severity of illness  Planned Discharge Destination: Skilled nursing facility  DVT prophylaxis.  Lovenox Time spent: 43  minutes  This record has been created using Systems analyst. Errors have been sought and corrected,but may not always be located. Such creation errors do not reflect on the standard of care.   Author: Lorella Nimrod, MD 01/23/2022 12:25 PM  For on call review [email or web address].

## 2022-01-23 NOTE — Progress Notes (Signed)
Physical Therapy Treatment Patient Details Name: [name] MRN: [id number] DOB: 23-Oct-1932 Today's Date: 01/23/2022   History of Present Illness Pt is an 61 y 38 M admitted on 01/20/22 after presenting with c/o fall & R hip pain. X-ray showed acute minimally displaced subtrochanteric periprosthetic right femoral fracture. Orthopedics have been consulted & it is recommended that pt be treated nonoperatively, PWB 25% on RLE at this time with trochanteric precautions (no active RLE hip abduction or external rotation). PMH: HLD, CAD, CABG, GERD, dementia, CKD 3A, a-fib not on anticoagulants (s/p Maze), prostate CA    PT Comments    Pre-medicated.  Gentle ROM prior to mobility.  Some discomfort noted but tolerates slow gently movements well.  He transitions to sitting with mod a x 1 and is able to sit unsupported today with supervision.  Lateral scoot to drop arm recliner with max a x 2.  Remains up in chair and tech educated on transfer technique.   Recommendations for follow up therapy are one component of a multi-disciplinary discharge planning process, led by the attending physician.  Recommendations may be updated based on patient status, additional functional criteria and insurance authorization.  Follow Up Recommendations  Skilled nursing-short term rehab (<3 hours/day) Can patient physically be transported by private vehicle: No   Assistance Recommended at Discharge Frequent or constant Supervision/Assistance  Patient can return home with the following Two people to help with walking and/or transfers;Two people to help with bathing/dressing/bathroom;Help with stairs or ramp for entrance;Direct supervision/assist for medications management;Assistance with feeding;Assist for transportation;Direct supervision/assist for financial management;Assistance with cooking/housework   Equipment Recommendations  None recommended by PT (TBD next venue)    Recommendations for Other Services        Precautions / Restrictions Precautions Precautions: Fall Precaution Comments: RLE trochanteric precautions (NO ABduction, no external rotation) Restrictions Weight Bearing Restrictions: Yes RLE Weight Bearing: Partial weight bearing RLE Partial Weight Bearing Percentage or Pounds: 25% Other Position/Activity Restrictions: trochantic precautions     Mobility  Bed Mobility Overal bed mobility: Needs Assistance Bed Mobility: Supine to Sit     Supine to sit: Mod assist          Transfers                  Lateral/Scoot Transfers: Total assist, +2 physical assistance General transfer comment: lateral scoot to L to drop arm recliner    Ambulation/Gait                   Stairs             Wheelchair Mobility    Modified Rankin (Stroke Patients Only)       Balance Overall balance assessment: Needs assistance Sitting-balance support: Feet supported, Single extremity supported Sitting balance-Leahy Scale: Fair Sitting balance - Comments: able to sit unsupported today but supervision for safety                                    Cognition Arousal/Alertness: Awake/alert Behavior During Therapy: WFL for tasks assessed/performed Overall Cognitive Status: History of cognitive impairments - at baseline                                          Exercises      General Comments  Pertinent Vitals/Pain Pain Assessment Pain Assessment: Faces Faces Pain Scale: Hurts little more Pain Location: some R hip pain with gentle ROM but generally seems comfortable with transfer to chair today. Pain Descriptors / Indicators: Grimacing Pain Intervention(s): Limited activity within patient's tolerance, Premedicated before session, Repositioned    Home Living                          Prior Function            PT Goals (current goals can now be found in the care plan section) Progress towards PT goals:  Progressing toward goals    Frequency    7X/week      PT Plan      Co-evaluation              AM-PAC PT "6 Clicks" Mobility   Outcome Measure  Help needed turning from your back to your side while in a flat bed without using bedrails?: A Lot Help needed moving from lying on your back to sitting on the side of a flat bed without using bedrails?: Total Help needed moving to and from a bed to a chair (including a wheelchair)?: Total Help needed standing up from a chair using your arms (e.g., wheelchair or bedside chair)?: Total Help needed to walk in hospital room?: Total Help needed climbing 3-5 steps with a railing? : Total 6 Click Score: 7    End of Session Equipment Utilized During Treatment: Gait belt Activity Tolerance: Patient tolerated treatment well Patient left: in bed;with family/visitor present Nurse Communication: Mobility status PT Visit Diagnosis: Muscle weakness (generalized) (M62.81);Difficulty in walking, not elsewhere classified (R26.2);Other abnormalities of gait and mobility (R26.89)     Time: 4920-1007 PT Time Calculation (min) (ACUTE ONLY): 11 min  Charges:  $Therapeutic Activity: 8-22 mins                   Chesley Noon, PTA 01/23/22, 10:52 AM

## 2022-01-23 NOTE — NC FL2 (Signed)
[place] LEVEL OF CARE FORM     IDENTIFICATION  Patient Name: [name] Birthdate: [birth year] Sex: male Admission Date (Current Location): 01/20/2022  [facility] and [place] Number:  Engineering geologist and Address:  [facility], [address], [place], [place] [address]      Provider Number: [id number]  Attending Physician Name and Address:  Lorella Nimrod, MD  Relative Name and Phone Number:  Tomasz, Steeves (Spouse) [phone] [facility])    Current Level of Care: Hospital Recommended Level of Care: [place] Prior Approval Number:  (PENDING)  Date Approved/Denied:   PASRR Number:  (PENDING)  Discharge Plan: SNF    Current Diagnoses: Patient Active Problem List   Diagnosis Date Noted   Oliguria and anuria 01/22/2022   Protein-calorie malnutrition, severe 01/21/2022   Closed right hip fracture ([place]) 01/20/2022   Fall at home, initial encounter 01/20/2022   Chronic kidney disease, stage 3a ([place]) 01/20/2022   CAD (coronary artery disease) 01/20/2022   Leukocytosis 01/20/2022   Protein-calorie malnutrition, moderate ([place]) 01/20/2022   Generalized weakness 01/19/2022   Trochanteric bursitis of right hip 01/27/2021   Moderate protein-calorie malnutrition ([place]) 04/02/2020   Osteoarthritis of lumbar spine 05/30/2019   Paroxysmal atrial fibrillation ([place]) 05/17/2019   Chronic right-sided low back pain without sciatica 11/16/2018   Gastroesophageal reflux disease without esophagitis 12/19/2017   Esophageal dysphagia    Stricture and stenosis of esophagus    Mild dementia ([place]) 06/07/2017   Primary osteoarthritis involving multiple joints 09/06/2016   Benign neoplasm of ascending colon    AAA (abdominal aortic aneurysm) without rupture ([place]) 04/17/2015   Constipation 09/23/2014   Chronic back pain 04/02/2014   Hardening of the aorta (main artery of the heart) ([place]) 04/02/2014   ED (erectile dysfunction) of organic  origin 02/19/2013   Pre-diabetes 02/19/2013   Increased glucose level 02/19/2013   History of coronary artery bypass surgery 05/23/2012   S/P Maze operation for atrial fibrillation 05/23/2012   Postprocedural state 05/23/2012   Personal history of prostate cancer 09/06/2011   Male urinary stress incontinence 09/06/2011   Testicular hypofunction 06/22/2011   Insomnia 10/05/2010   Hyperlipidemia 06/17/2009   Benign hypertension with CKD (chronic kidney disease) stage III ([place]) 06/17/2009   Coronary arteriosclerosis in native artery 06/17/2009   History of atrial fibrillation 06/17/2009   Benign essential hypertension 06/17/2009    Orientation RESPIRATION BLADDER Height & Weight     Self  Normal External catheter Weight: 61.2 kg Height:  '6\' 1"'$  (185.4 cm)  BEHAVIORAL SYMPTOMS/MOOD NEUROLOGICAL BOWEL NUTRITION STATUS      Continent (No BM documented since admission on 01/20/22.)    AMBULATORY STATUS COMMUNICATION OF NEEDS Skin   Extensive Assist (25% PWB RLE post RIght hip trauma/fall.) Verbally Surgical wounds                       Personal Care Assistance Level of Assistance  Bathing, Feeding, Dressing Bathing Assistance: Maximum assistance Feeding assistance: Maximum assistance Dressing Assistance: Maximum assistance     Functional Limitations Info  Sight Sight Info: Impaired ([place])        SPECIAL CARE FACTORS FREQUENCY  PT (By licensed PT), OT (By licensed OT)     PT Frequency: 5x/week OT Frequency: 5x/week            Contractures Contractures Info: Not present    Additional Factors Info  Code Status, Allergies Code Status Info: DNR Allergies Info: Ambien (Zolpidem Tartrate),  Pravastatin Sodium, Rosuvastatin Calcium, Zolpidem Tartrate           Current Medications (01/23/2022):  This is the current hospital active medication list Current Facility-Administered Medications  Medication Dose Route Frequency Provider Last Rate Last Admin    acetaminophen (TYLENOL) tablet 650 mg  650 mg Oral Q6H PRN Ivor Costa, MD       ascorbic acid (VITAMIN C) tablet 250 mg  250 mg Oral Daily Ivor Costa, MD   250 mg at 01/23/22 1155   bisacodyl (DULCOLAX) suppository 10 mg  10 mg Rectal Daily PRN Ivor Costa, MD       cholecalciferol (VITAMIN D3) 25 MCG (1000 UNIT) tablet 5,000 Units  5,000 Units Oral Daily Ivor Costa, MD   5,000 Units at 01/23/22 1155   dorzolamide-timolol (COSOPT) 2-0.5 % ophthalmic solution 1 drop  1 drop Both Eyes BID Ivor Costa, MD   1 drop at 01/23/22 1156   enoxaparin (LOVENOX) injection 40 mg  40 mg Subcutaneous Q24H Ivor Costa, MD   40 mg at 01/22/22 2121   ezetimibe (ZETIA) tablet 10 mg  10 mg Oral Daily Ivor Costa, MD   10 mg at 01/23/22 1154   feeding supplement (ENSURE ENLIVE / ENSURE PLUS) liquid 237 mL  237 mL Oral TID BM Lorella Nimrod, MD   237 mL at 01/21/22 1400   [START ON 01/24/2022] ferrous sulfate tablet 325 mg  325 mg Oral Q breakfast Lorella Nimrod, MD       fluticasone (FLONASE) 50 MCG/ACT nasal spray 1 spray  1 spray Each Nare Daily PRN Ivor Costa, MD       hydrALAZINE (APRESOLINE) injection 5 mg  5 mg Intravenous Q2H PRN Ivor Costa, MD   5 mg at 01/20/22 1716   lactated ringers infusion   Intravenous Continuous Lorella Nimrod, MD       latanoprost (XALATAN) 0.005 % ophthalmic solution 1 drop  1 drop Both Eyes Daily Ivor Costa, MD   1 drop at 01/22/22 2017   lidocaine (LIDODERM) 5 % 1 patch  1 patch Transdermal Q24H Ivor Costa, MD   1 patch at 01/23/22 1154   methocarbamol (ROBAXIN) tablet 500 mg  500 mg Oral Q8H PRN Ivor Costa, MD       morphine (PF) 2 MG/ML injection 1 mg  1 mg Intravenous Q3H PRN Ivor Costa, MD   1 mg at 01/23/22 0207   multivitamin with minerals tablet 1 tablet  1 tablet Oral Daily Lorella Nimrod, MD   1 tablet at 01/23/22 1154   ondansetron (ZOFRAN) injection 4 mg  4 mg Intravenous Q8H PRN Ivor Costa, MD       oxyCODONE-acetaminophen (PERCOCET/ROXICET) 5-325 MG per tablet 1 tablet  1 tablet  Oral Q4H PRN Ivor Costa, MD   1 tablet at 01/23/22 0841   polyethylene glycol (MIRALAX / GLYCOLAX) packet 17 g  17 g Oral Daily PRN Ivor Costa, MD       senna (SENOKOT) tablet 8.6 mg  1 tablet Oral Daily Ivor Costa, MD   8.6 mg at 01/23/22 1155   tamsulosin (FLOMAX) capsule 0.4 mg  0.4 mg Oral QPC supper Lorella Nimrod, MD   0.4 mg at 01/22/22 1843     Discharge Medications: Please see discharge summary for a list of discharge medications.  Relevant Imaging Results:  Relevant Lab Results:   Additional Information SS# [id number]  Izola Price, RN

## 2022-01-23 NOTE — Plan of Care (Signed)
  Problem: Clinical Measurements: Goal: Will remain free from infection Outcome: Progressing   Problem: Clinical Measurements: Goal: Diagnostic test results will improve Outcome: Progressing   Problem: Clinical Measurements: Goal: Respiratory complications will improve Outcome: Progressing   Problem: Elimination: Goal: Will not experience complications related to bowel motility Outcome: Progressing   Problem: Pain Managment: Goal: General experience of comfort will improve Outcome: Progressing

## 2022-01-23 NOTE — TOC Progression Note (Addendum)
Transition of Care [facility]) - Progression Note    Patient Details  Name: [name] MRN: [id number] Date of Birth: 03-Jun-1932  Transition of [facility]) CM/SW Contact  Izola Price, RN Phone Number: 01/23/2022, 11:15 AM  Clinical Narrative: 1/14: Spoke with patient regarding choices, she had not been given Medicare.gov list/site, but did want to make sure choice was in network with insurance (HTA). Directed to website at [facility].gov and daugther was also going to contact [employer]TA to make sure choices were in network and to clarify deductible as she feels after 20 days, given patients overall history, that he may transition to hospice care. She will call RN CM back with choices after reviewing [employer].pl per requirements.  Patient has not been in facility before so will also need a PASRR, FL2 , and current therapy notes, which were missing from [place] referrals sent out 01/21/22.  Simmie Davies RN CM     UPDATE: 250 pm. Spouse reviewed Medicare.gov list and first choices are 1. [place] 2. [place] 3. [facility]. [place] or [facility] and needs to be close enough to drive to visit and help with care. PASRR Pending but will send out new CSW referrals to those places. Simmie Davies RN CM   Expected Discharge Plan: Skilled Nursing Facility Barriers to Discharge: Continued Medical Work up  Expected Discharge Plan and Services       Living arrangements for the past 2 months: Single Family Home                                       Social Determinants of Health (SDOH) Interventions SDOH Screenings   Food Insecurity: No Food Insecurity (01/20/2022)  Housing: Low Risk  (01/20/2022)  Transportation Needs: No Transportation Needs (01/20/2022)  Utilities: Not At Risk (01/20/2022)  Alcohol Screen: Low Risk  (08/13/2021)  Depression (PHQ2-9): High Risk (01/19/2022)  Financial Resource Strain: Low Risk  (08/13/2021)  Physical Activity: Insufficiently Active (08/13/2021)   Social Connections: Socially Isolated (08/13/2021)  Stress: No Stress Concern Present (08/13/2021)  Tobacco Use: Medium Risk (01/20/2022)    Readmission Risk Interventions     No data to display

## 2022-01-23 NOTE — Assessment & Plan Note (Signed)
-  Creatinine at baseline of 1.3-1.5, slight worsening today but remained within baseline -Monitor renal function -Avoid nephrotoxin

## 2022-01-24 DIAGNOSIS — Z789 Other specified health status: Secondary | ICD-10-CM

## 2022-01-24 DIAGNOSIS — Z7189 Other specified counseling: Secondary | ICD-10-CM | POA: Diagnosis not present

## 2022-01-24 DIAGNOSIS — Z515 Encounter for palliative care: Secondary | ICD-10-CM | POA: Diagnosis not present

## 2022-01-24 DIAGNOSIS — F0390 Unspecified dementia without behavioral disturbance: Secondary | ICD-10-CM

## 2022-01-24 DIAGNOSIS — S72001A Fracture of unspecified part of neck of right femur, initial encounter for closed fracture: Secondary | ICD-10-CM | POA: Diagnosis not present

## 2022-01-24 NOTE — Care Management Important Message (Signed)
Important Message  Patient Details  Name: [name] MRN: [id number] Date of Birth: 1932/09/13   Medicare Important Message Given:  N/A - LOS <3 / Initial given by admissions     Juliann Pulse A Oliver Heitzenrater 01/24/2022, 7:52 AM

## 2022-01-24 NOTE — Progress Notes (Signed)
Occupational Therapy Treatment Patient Details Name: [name] MRN: [id number] DOB: May 03, 1932 Today's Date: 01/24/2022   History of present illness Pt is an 83 y 50 M admitted on 01/20/22 after presenting with c/o fall & R hip pain. X-ray showed acute minimally displaced subtrochanteric periprosthetic right femoral fracture. Orthopedics have been consulted & it is recommended that pt be treated nonoperatively, PWB 25% on RLE at this time with trochanteric precautions (no active RLE hip abduction or external rotation). PMH: HLD, CAD, CABG, GERD, dementia, CKD 3A, a-fib not on anticoagulants (s/p Maze), prostate CA   OT comments  Pt received sitting EOB with PT. He completed lateral scoot transfer from EOB>recliner. He continues to require +2 assist for functional transfers. OT noting L lateral lean in sitting requiring physical assistance to correct. He required Max A for grooming tasks. Pt mainly keeping eyes closed t/o session and inconsistently followed single step commands. Pt limited by RLE pain this date. Pt left sitting up in recliner with all needs in reach. Set up A provided for meal tray, wife planning to assist pt with self-feeding. Pt is making progress toward goal completion. D/C recommendation remains appropriate. OT will continue to follow acutely.      Recommendations for follow up therapy are one component of a multi-disciplinary discharge planning process, led by the attending physician.  Recommendations may be updated based on patient status, additional functional criteria and insurance authorization.    Follow Up Recommendations  Skilled nursing-short term rehab (<3 hours/day)     Assistance Recommended at Discharge Frequent or constant Supervision/Assistance  Patient can return home with the following  Two people to help with walking and/or transfers;Two people to help with bathing/dressing/bathroom;Help with stairs or ramp for entrance   Equipment Recommendations   Other (comment) (defer to next venue of care)    Recommendations for Other Services      Precautions / Restrictions Precautions Precautions: Fall Precaution Comments: RLE trochanteric precautions (NO ABduction, no external rotation) Restrictions Weight Bearing Restrictions: Yes RLE Weight Bearing: Partial weight bearing RLE Partial Weight Bearing Percentage or Pounds: 25% Other Position/Activity Restrictions: trochantic precautions       Mobility Bed Mobility Overal bed mobility: Needs Assistance             General bed mobility comments: NT, pt sitting at EOB with PT upon arrival    Transfers Overall transfer level: Needs assistance Equipment used: 2 person hand held assist Transfers: Bed to chair/wheelchair/BSC            Lateral/Scoot Transfers: Total assist, +2 physical assistance General transfer comment: lateral scoot to L to drop arm recliner     Balance Overall balance assessment: Needs assistance Sitting-balance support: Feet supported, Single extremity supported Sitting balance-Leahy Scale: Poor Sitting balance - Comments: needed min/mod a today for sitting balance, left lateral lean Postural control: Left lateral lean     Standing balance comment: deferrred 2/2 pt requiring assist for static seating balance                           ADL either performed or assessed with clinical judgement   ADL Overall ADL's : Needs assistance/impaired Eating/Feeding: Set up;Sitting Eating/Feeding Details (indicate cue type and reason): wife requesting to assist pt with self-feeding Grooming: Wash/dry face;Maximal assistance;Sitting;Brushing hair  Extremity/Trunk Assessment Upper Extremity Assessment Upper Extremity Assessment: Generalized weakness   Lower Extremity Assessment Lower Extremity Assessment: Generalized weakness   Cervical / Trunk Assessment Cervical / Trunk Assessment: Kyphotic     Vision Baseline Vision/History: 1 Wears glasses Patient Visual Report: No change from baseline     Perception     Praxis      Cognition Arousal/Alertness: Lethargic Behavior During Therapy: WFL for tasks assessed/performed, Flat affect Overall Cognitive Status: History of cognitive impairments - at baseline                                 General Comments: Pt mainly keeping eyes closed t/o, resistive to any movement 2/2 pain (stating "don't do that" or "don't hurt me"), inconsistently followed single step commands        Exercises      Shoulder Instructions       General Comments      Pertinent Vitals/ Pain       Pain Assessment Pain Assessment: Faces Faces Pain Scale: Hurts even more Pain Location: RLE Pain Descriptors / Indicators: Grimacing, Guarding Pain Intervention(s): Limited activity within patient's tolerance, Premedicated before session, Repositioned, Monitored during session  Home Living                                          Prior Functioning/Environment              Frequency  Min 2X/week        Progress Toward Goals  OT Goals(current goals can now be found in the care plan section)  Progress towards OT goals: Progressing toward goals  Acute Rehab OT Goals Patient Stated Goal: to improve pain OT Goal Formulation: With family Time For Goal Achievement: 02/04/22 Potential to Achieve Goals: [place] Discharge plan remains appropriate;Frequency remains appropriate    Co-evaluation                 AM-PAC OT "6 Clicks" Daily Activity     Outcome Measure   Help from another person eating meals?: A Lot Help from another person taking care of personal grooming?: A Lot Help from another person toileting, which includes using toliet, bedpan, or urinal?: A Lot Help from another person bathing (including washing, rinsing, drying)?: A Lot Help from another person to put on and taking off regular  upper body clothing?: A Lot Help from another person to put on and taking off regular lower body clothing?: A Lot 6 Click Score: 12    End of Session Equipment Utilized During Treatment: Gait belt  OT Visit Diagnosis: Other abnormalities of gait and mobility (R26.89);Muscle weakness (generalized) (M62.81)   Activity Tolerance Patient tolerated treatment well;Patient limited by pain   Patient Left in chair;with call bell/phone within reach;with chair alarm set;with family/visitor present   Nurse Communication Mobility status        Time: 8921-1941 OT Time Calculation (min): 13 min  Charges: OT General Charges $OT Visit: 1 Visit OT Treatments $Self Care/Home Management : 8-22 mins  Jane [facility] MS, OTR/L ascom [phone]  01/24/22, 1:35 PM

## 2022-01-24 NOTE — TOC Progression Note (Signed)
Transition of Care [facility]) - Progression Note    Patient Details  Name: [name] MRN: [id number] Date of Birth: April 15, 1932  Transition of [facility]) CM/SW Contact  Laurena Slimmer, RN Phone Number: 01/24/2022, 3:36 PM  Clinical Narrative:    Retreieved call from HTA. Spoke with [facility] nurse, [place].  Request for ambulance approved Request for SNF pending denial. Peer to Peer may be completed with Dr. Lynder Parents @ [phone]. Request needs to be completed by 2pm 01/25/2022. MD notified.    Expected Discharge Plan: Skilled Nursing Facility Barriers to Discharge: Continued Medical Work up  Expected Discharge Plan and Services       Living arrangements for the past 2 months: Single Family Home                                       Social Determinants of Health (SDOH) Interventions SDOH Screenings   Food Insecurity: No Food Insecurity (01/20/2022)  Housing: Low Risk  (01/20/2022)  Transportation Needs: No Transportation Needs (01/20/2022)  Utilities: Not At Risk (01/20/2022)  Alcohol Screen: Low Risk  (08/13/2021)  Depression (PHQ2-9): High Risk (01/19/2022)  Financial Resource Strain: Low Risk  (08/13/2021)  Physical Activity: Insufficiently Active (08/13/2021)  Social Connections: Socially Isolated (08/13/2021)  Stress: No Stress Concern Present (08/13/2021)  Tobacco Use: Medium Risk (01/20/2022)    Readmission Risk Interventions     No data to display

## 2022-01-24 NOTE — Progress Notes (Signed)
  Subjective:  Patient sitting up in a chair and in no acute distress..  His daughter and wife are at the bedside when I initially arrived.  Patient's wife states that she is having tingling in her face and is going to the ER with her daughter for evaluation of stroke at the recommendation of her PCP.  Patient just received some Tylenol for mild to moderate right hip pain.  Palliative care has been consulted.  Objective:   VITALS:   Vitals:   01/23/22 0823 01/23/22 1617 01/23/22 2336 01/24/22 0905  BP: (!) 148/69 130/76 (!) 135/59 (!) 140/75  Pulse: 70 79 78 87  Resp: '17 17 18 16  '$ Temp: 98.5 F (36.9 C) (!) 97.5 F (36.4 C) 98.3 F (36.8 C) 98 F (36.7 C)  TempSrc:      SpO2: 99% 100% 100% 100%  Weight:      Height:        PHYSICAL EXAM: Right lower extremity Neurovascular intact Sensation intact distally Intact pulses distally Dorsiflexion/Plantar flexion intact No cellulitis present, no significant swelling seen. There is no ecchymosis overlying the right hip. Compartment soft  LABS  No results found for this or any previous visit (from the past 24 hour(s)).  No results found.  Assessment/Plan:     Principal Problem:   Closed right hip fracture (HCC) Active Problems:   Hyperlipidemia   History of atrial fibrillation   Mild dementia ([place])   Fall at home, initial encounter   Chronic kidney disease, stage 3a ([place])   CAD (coronary artery disease)   Leukocytosis   Protein-calorie malnutrition, severe   Oliguria and anuria  Continue with physical therapy as tolerated.  Patient require a skilled nursing facility upon discharge.  The left ear has been consulted for possible hospice care.  Patient has a right periprosthetic fracture which is nondisplaced.  Patient will continue to be 25% partial weightbearing on the right lower extremity with trochanteric precautions.    Thornton Park , MD 01/24/2022, 1:26 PM

## 2022-01-24 NOTE — Progress Notes (Signed)
[facility]) Hospice hospital liaison note  Referral received for palliative to follow upon discharge to SNF however it appears at this time disposition is unclear.   Liaison will follow for discharge disposition and proceed with appropriate referral once this is determined.   Thank you for the opportunity to participate in this patient's care Jhonnie Garner, BSN, RN Hospice hospital liaison [phone]

## 2022-01-24 NOTE — Plan of Care (Signed)

## 2022-01-24 NOTE — Progress Notes (Signed)
Physical Therapy Treatment Patient Details Name: [name] MRN: [id number] DOB: [birth year] Today's Date: 01/24/2022   History of Present Illness Pt is an 82 y 70 M admitted on 01/20/22 after presenting with c/o fall & R hip pain. X-ray showed acute minimally displaced subtrochanteric periprosthetic right femoral fracture. Orthopedics have been consulted & it is recommended that pt be treated nonoperatively, PWB 25% on RLE at this time with trochanteric precautions (no active RLE hip abduction or external rotation). PMH: HLD, CAD, CABG, GERD, dementia, CKD 3A, a-fib not on anticoagulants (s/p Maze), prostate CA    PT Comments    Pt more fatigued today but ready to get up to chair.  Gentle ROM prior to transitioning to EOB with max a x 1.  He did require some increased assist today in sitting due to left lateral lean and unable to sit unsupported today.  Max a x 2 dependant lateral scoot to drop arm recliner.  Remained in chair after session with OT in room.  Discussed transfer with tech and RN.   Recommendations for follow up therapy are one component of a multi-disciplinary discharge planning process, led by the attending physician.  Recommendations may be updated based on patient status, additional functional criteria and insurance authorization.  Follow Up Recommendations  Skilled nursing-short term rehab (<3 hours/day) Can patient physically be transported by private vehicle: No   Assistance Recommended at Discharge Frequent or constant Supervision/Assistance  Patient can return home with the following Two people to help with walking and/or transfers;Two people to help with bathing/dressing/bathroom;Help with stairs or ramp for entrance;Direct supervision/assist for medications management;Assistance with feeding;Assist for transportation;Direct supervision/assist for financial management;Assistance with cooking/housework   Equipment Recommendations  None recommended by PT (TBD next  venue)    Recommendations for Other Services       Precautions / Restrictions Precautions Precautions: Fall Precaution Comments: RLE trochanteric precautions (NO ABduction, no external rotation) Restrictions Weight Bearing Restrictions: Yes RLE Weight Bearing: Partial weight bearing RLE Partial Weight Bearing Percentage or Pounds: 25% Other Position/Activity Restrictions: trochantic precautions     Mobility  Bed Mobility Overal bed mobility: Needs Assistance Bed Mobility: Supine to Sit     Supine to sit: Mod assist, Max assist     General bed mobility comments: increased assist today.    Transfers                  Lateral/Scoot Transfers: Total assist, +2 physical assistance General transfer comment: lateral scoot to L to drop arm recliner    Ambulation/Gait                   Stairs             Wheelchair Mobility    Modified Rankin (Stroke Patients Only)       Balance Overall balance assessment: Needs assistance Sitting-balance support: Feet supported, Single extremity supported Sitting balance-Leahy Scale: Poor Sitting balance - Comments: needed min/mod a today for sitting balance, left lean Postural control: Left lateral lean     Standing balance comment: deferred attempt due to increased assist in sitting.                            Cognition Arousal/Alertness: Lethargic Behavior During Therapy: WFL for tasks assessed/performed, Flat affect Overall Cognitive Status: History of cognitive impairments - at baseline  Exercises      General Comments        Pertinent Vitals/Pain Pain Assessment Pain Assessment: Faces Faces Pain Scale: Hurts even more Pain Location: some increased pain today with mobility but lethargy/less engaged seemed to be primary reason for increased assist today. Pain Descriptors / Indicators: Grimacing    Home Living                           Prior Function            PT Goals (current goals can now be found in the care plan section) Progress towards PT goals: Progressing toward goals    Frequency    7X/week      PT Plan      Co-evaluation              AM-PAC PT "6 Clicks" Mobility   Outcome Measure  Help needed turning from your back to your side while in a flat bed without using bedrails?: A Lot Help needed moving from lying on your back to sitting on the side of a flat bed without using bedrails?: Total Help needed moving to and from a bed to a chair (including a wheelchair)?: Total Help needed standing up from a chair using your arms (e.g., wheelchair or bedside chair)?: Total Help needed to walk in hospital room?: Total Help needed climbing 3-5 steps with a railing? : Total 6 Click Score: 7    End of Session Equipment Utilized During Treatment: Gait belt Activity Tolerance: Patient tolerated treatment well Patient left: in bed;with family/visitor present Nurse Communication: Mobility status PT Visit Diagnosis: Muscle weakness (generalized) (M62.81);Difficulty in walking, not elsewhere classified (R26.2);Other abnormalities of gait and mobility (R26.89)     Time: 0912-0920 PT Time Calculation (min) (ACUTE ONLY): 8 min  Charges:  $Therapeutic Activity: 8-22 mins                   Chesley Noon, PTA 01/24/22, 9:49 AM

## 2022-01-24 NOTE — Plan of Care (Signed)

## 2022-01-24 NOTE — Progress Notes (Signed)
Progress Note   Patient: [name] XBD:[id number] DOB: 01/26/1932 DOA: 01/20/2022     3 DOS: the patient was seen and examined on 01/24/2022   Brief hospital course: Taken from H&P.  [name] is a 89 y.o. male with medical history significant of hyperlipidemia, CAD, CABG, GERD, dementia, CKD-3A, atrial fibrillation not on anticoagulants (s/p Maze), prostate cancer, who presents with accidental fall while using the bathroom without turning on the light and right hip pain.  Rest of the ROS was negative  Data reviewed independently and ED Course: pt was found to have WBC 13.4, stable renal function, temperature normal, blood pressure 145/59, heart rate 81, RR 18, oxygen saturation 98% on room air.  CT of head is negative for acute intracranial abnormalities.  CT of C spine is negative for acute injury, but showed degenerative disc disease.  X-ray of right hip: Acute minimally displaced subtrochanteric periprosthetic right femoral fractur   EKG: I have personally reviewed.  Sinus rhythm, QTc 444, T wave inversion in V1-V4.  Consulted Dr. Mack Guise of Ortho.  He "discussed this case with the trauma specialist at [facility] who have been turned discussed it with their joint specialist.  It has been recommended that the patient be treated nonoperatively for this fracture.  No surgery is scheduled at this time.  He may received physical therapy and will be 25% weightbearing on the right lower extremity with a walker.  He will be on trunk precautions".   1/12: Remained hemodynamically stable.  Labs with stable BMP consistent with CKD stage IIIA, CBC with stable leukocytosis and hemoglobin.  PT/OT are recommending SNF. Patient has advanced dementia and oriented to self only at baseline.  1/13: Vital stable but patient was very somnolent and lethargic during morning rounds, received pain medications just before the shift change.  No urinary output recorded and PureWick canister hardly  have couple of mL.  Ordered bladder scan, LR bolus 500 cc followed by 1 L of LR. Need strict intake and output.  P.o. intake remained poor. Nursing concern of food pocketing and unable to swallow-swallow evaluation ordered. Bladder scan with more than 200 cc of urine, also ordered Flomax for concern of urinary retention.  1/14: Total recorded urinary output of 380 ML in 24-hour.  P.o. intake remained poor.  Received about 2 L of fluid yesterday, giving 2 more liters of fluid at 100 mL/h.  Slight worsening of creatinine to 1.41, still within baseline creatinine of 1.3-1.5. Wife would like to see how he does with short-term rehab, if not showing much improvement then she will consider taking him back home with hospice help.  Awaiting SNF placement.  1/15: No new concern.  Recorded UOP more than 900 cc.  Palliative care met with family and they will follow-up as outpatient while at short-term rehab to determine further needs and his response.  Wife needs some time to prepare house and herself for hospice at home if needed. P.o. intake remains poor.  Now had a bed offer, pending insurance authorization.  Patient is high risk for deterioration and mortality based on age, advanced underlying dementia and recent hip fracture along with other comorbidities.     Assessment and Plan: * Closed right hip fracture ([place]) Minimally displaced subtrochanteric periprosthetic right femoral fracture.  Orthopedic currently recommending conservative management after talking with trauma team and joint specialist at [facility].  Patient is high risk for surgical intervention. PT/OT are recommending SNF -Continue with conservative management -Continue with pain  control  Oliguria and anuria Improved with IV hydration. -Encourage p.o. hydration -Continue with strict intake and output  Fall at home, initial encounter Resulted in above mentioned fracture. -Management as mentioned above -Fall precaution    Hyperlipidemia -Continue Zetia  Chronic kidney disease, stage 3a ([place]) -Creatinine at baseline of 1.3-1.5, slight worsening today but remained within baseline -Monitor renal function -Avoid nephrotoxin  Mild dementia ([place]) Patient is oriented to self only at baseline. -Delirium and fall precautions  History of atrial fibrillation S/p of maze.  Not on any anticoagulation at home. -Continue to monitor  CAD (coronary artery disease) No chest pain. -Continue home Zetia  Leukocytosis Leukocytosis on admission most likely secondary to stress-induced demargination with fall and hip fracture.  Seems stable. -Continue to monitor  Protein-calorie malnutrition, severe Estimated body mass index is 17.8 kg/m as calculated from the following:   Height as of this encounter: '6\' 1"'$  (1.854 m).   Weight as of this encounter: 61.2 kg.   -Dietitian consult        Subjective: Patient was sitting in chair, little somnolent after taking pain medication.  Denies any pain.  Physical Exam: Vitals:   01/23/22 0823 01/23/22 1617 01/23/22 2336 01/24/22 0905  BP: (!) 148/69 130/76 (!) 135/59 (!) 140/75  Pulse: 70 79 78 87  Resp: '17 17 18 16  '$ Temp: 98.5 F (36.9 C) (!) 97.5 F (36.4 C) 98.3 F (36.8 C) 98 F (36.7 C)  TempSrc:      SpO2: 99% 100% 100% 100%  Weight:      Height:       General.  Frail elderly man, in no acute distress. Pulmonary.  Lungs clear bilaterally, normal respiratory effort. CV.  Regular rate and rhythm, no JVD, rub or murmur. Abdomen.  Soft, nontender, nondistended, BS positive. CNS.  Alert and oriented to self.  No focal neurologic deficit. Extremities.  No edema, no cyanosis, pulses intact and symmetrical. Psychiatry.  Judgment and insight appears impaired  Data Reviewed: Prior data reviewed  Family Communication: Discussed with wife and daughter at bedside  Disposition: Status is: Inpatient Remains inpatient appropriate because: Severity of  illness  Planned Discharge Destination: Skilled nursing facility  DVT prophylaxis.  Lovenox Time spent: 40 minutes  This record has been created using Systems analyst. Errors have been sought and corrected,but may not always be located. Such creation errors do not reflect on the standard of care.   Author: Lorella Nimrod, MD 01/24/2022 1:51 PM  For on call review [email or web address].

## 2022-01-24 NOTE — Consult Note (Signed)
Consultation Note Date: 01/24/2022   Patient Name: [name]  DOB: Oct 13, 1932  MRN: [id number]  Age / Sex: 89 y.o., male  PCP: [name], DO Referring Physician: Lorella Nimrod, MD  Reason for Consultation: Establishing goals of care  HPI/Patient Profile: 89 y.o. male  with past medical history of dementia, CAD, CABG, CKD stage 3a, atrial fibrillation (s/p Maze), prostate cancer, GERD admitted on 01/20/2022 with fall and right hip pain. Not a candidate for surgery.   Clinical Assessment and Goals of Care: Extensive chart review. I met initially at bedside but visitor present so I stepped out to speak privately with wife, [name]. Debbie and I have a discussion about goals of care. [name] expresses David's gradual decline with dementia and recognizes that he is in a significantly worse position at this stage. She understands that he is unlikely to improve from here and that he will likely continue to decline. We discussed where to go from here SNF rehab/palliative vs home with hospice. Ultimately SNF rehab with palliative to follow will allow Debbie some time to prepare physically and emotionally to return to caring for her husband at home. She is very clear in her desire to take him home with hospice support from SNF rehab which is appropriate. We were joined by Dr. Reesa Chew during part of this conversation as well.   Debbie and I further and discussed MOST form. We completed MOST form: DNR, comfort measures/no rehospitalization, no antibiotics, no IVF, no feeding tubes. [name] is clear in her desire for comfort care. If Shanon Brow declines at SNF she would desire comfort and hospice care and does not want him to return to the hospital. She does not desire any life prolonging measures from this stage as he has declined quality of life. I have communicated this plan to Dr. Parks Ranger per wife request. I have  also communicated this to [facility] liaison so they can assist with palliative and hospice care. They were previously connected with [facility] outpatient palliative care.   All questions/concerns addressed. Emotional support provided.   Primary Decision Maker NEXT OF KIN wife    SUMMARY OF RECOMMENDATIONS   - DNR - MOST completed: reflected of comfort care - SNF rehab trial with palliative to follow to assist with transition to hospice at home  Code Status/Advance Care Planning: DNR  Prognosis:  Overall prognosis poor likely weeks to months. Less than 6 months.   Discharge Planning: [facility] for rehab with Palliative care service follow-up      Primary Diagnoses: Present on Admission:  Closed right hip fracture ([place])  Hyperlipidemia  Chronic kidney disease, stage 3a ([place])  Mild dementia ([place])  CAD (coronary artery disease)  Leukocytosis  Protein-calorie malnutrition, severe   I have reviewed the medical record, interviewed the patient and family, and examined the patient. The following aspects are pertinent.  Past Medical History:  Diagnosis Date   Arthritis    Coronary artery disease    a. 1993 s/p PCI/BMS LAD;  b. 2003 PCI RCA;  c. 09/2006 ISR LAD->PCI, PCI RCA;  d. 02/2007 MV: no ischemia;  d. 01/2012 MV: EF 74%, no ischemia.   History of prostate cancer    Hyperlipidemia    Hyperthyroidism    Low testosterone    PAF (paroxysmal atrial fibrillation) ([place])    a. previously on amio ->stopped in 2013;  b. recurrent PAF 01/2012->Pradaxa and amio initiated, subsequently came off of pradaxa 2/2 cost;  c. 02/2012 Echo: EF 55-60%, Gr 1 DD.   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 He was born in [place] and went to [employer] in [place]. Daughter in [place], 2 step-daughters. Retired from [employer].   Social Determinants of Health   Financial Resource Strain: Low Risk  (08/13/2021)   Overall Financial Resource Strain (CARDIA)    Difficulty of Paying Living Expenses: Not hard at all  Food Insecurity: No Food Insecurity (01/20/2022)   Hunger Vital Sign    Worried About Running Out of Food in the Last Year: Never true    Ran Out of Food in the Last Year: Never true  Transportation Needs: No Transportation Needs (01/20/2022)   PRAPARE - Hydrologist (Medical): No    Lack of Transportation (Non-Medical): No  Physical Activity: Insufficiently Active (08/13/2021)   Exercise Vital Sign    Days of Exercise per Week: 7 days    Minutes of Exercise per Session: 20 min  Stress: No Stress Concern Present (08/13/2021)   [place]    Feeling of Stress : Not at all  Social Connections: Socially Isolated (08/13/2021)   Social Connection and Isolation Panel [NHANES]    Frequency of Communication with Friends and Family: Once a week    Frequency of Social Gatherings with Friends and Family: Once a week    Attends Religious Services: Never    Marine scientist or Organizations: No    Attends Arts administrator: Never    Marital Status: Married   Family History  Problem Relation Age of Onset   Coronary artery disease Mother    Heart disease Mother    Coronary artery disease Sister    Diabetes Sister    Heart disease Sister 23       deceased   Diabetes Father    Heart disease Brother    Scheduled Meds:  vitamin C  250 mg Oral Daily   cholecalciferol  5,000 Units Oral Daily   dorzolamide-timolol  1 drop Both Eyes BID   enoxaparin (LOVENOX) injection  40 mg Subcutaneous Q24H   ezetimibe  10 mg Oral Daily   feeding supplement  237 mL Oral TID BM   ferrous sulfate  325 mg Oral Q breakfast   latanoprost  1 drop Both Eyes Daily   lidocaine  1 patch Transdermal Q24H   multivitamin with minerals  1 tablet Oral Daily   senna  1 tablet Oral Daily   tamsulosin  0.4 mg Oral QPC supper   Continuous Infusions: PRN Meds:.acetaminophen, bisacodyl, fluticasone, hydrALAZINE, hydrOXYzine, methocarbamol, morphine injection, ondansetron (ZOFRAN) IV, oxyCODONE-acetaminophen, polyethylene glycol Allergies  Allergen Reactions   Ambien [Zolpidem Tartrate] Other (See Comments)    Super sensitive, Hallucinations   Pravastatin Sodium     REACTION: muscle aches...crestor   Rosuvastatin Calcium     REACTION: muscle aches...crestor   Zolpidem Tartrate     Hallucinations   Review of Systems  Unable to perform ROS: Dementia    Physical Exam Vitals and nursing note reviewed.  Constitutional:      General: He is sleeping. He is not in acute distress.    Appearance: He is ill-appearing.  Cardiovascular:     Rate and Rhythm: Normal rate.  Pulmonary:     Effort: No tachypnea, accessory muscle usage or respiratory distress.  Abdominal:     Palpations: Abdomen is soft.  Neurological:     Mental Status: He is confused.     Vital Signs: BP (!) 140/75 (BP Location: Right Arm)   Pulse 87   Temp 98 F (36.7 C)   Resp 16   Ht '6\' 1"'$  (1.854 m)   Wt 61.2 kg   SpO2 100%   BMI 17.80 kg/m  Pain  Scale: 0-10   Pain Score: Asleep   SpO2: SpO2: 100 % O2 Device:SpO2: 100 % O2 Flow Rate: .   IO: Intake/output summary:  Intake/Output Summary (Last 24 hours) at 01/24/2022 1043 Last data filed at 01/24/2022 0831 Gross per 24 hour  Intake 2028.19 ml  Output 900 ml  Net 1128.19 ml    LBM: Last BM Date : 01/19/22 Baseline Weight: Weight: 61.2 kg Most recent weight: Weight: 61.2 kg     Palliative Assessment/Data:     Time In: 0945  Time Total: 75 min  Greater than 50%  of this time was spent counseling and coordinating care related to the above assessment and plan.  Signed by: Vinie Sill, NP Palliative Medicine Team Pager # [phone] (M-F 8a-5p) Team Phone # [phone] (Nights/Weekends)

## 2022-01-24 NOTE — TOC Progression Note (Signed)
Transition of Care [facility]) - Progression Note    Patient Details  Name: DONNALD TABAR MRN: [id number] Date of Birth: [birth year]  [facility]) CM/SW Contact  Laurena Slimmer, RN Phone Number: 01/24/2022, 9:27 AM  Clinical Narrative:    [place] PASSR still pending per [place] Must.   Spoke with patient's wife regarding bed offers. She was given bed offers for [facility], [facility], [facility], [facility], and [facility]. Patient wife chose [facility] she was advised insurance approval would still be needed.   HTA spoke with representative Shawna to start [facility].     Expected Discharge Plan: Skilled Nursing Facility Barriers to Discharge: Continued Medical Work up  Expected Discharge Plan and Services       Living arrangements for the past 2 months: Single Family Home                                       Social Determinants of Health (SDOH) Interventions SDOH Screenings   Food Insecurity: No Food Insecurity (01/20/2022)  Housing: Low Risk  (01/20/2022)  Transportation Needs: No Transportation Needs (01/20/2022)  Utilities: Not At Risk (01/20/2022)  Alcohol Screen: Low Risk  (08/13/2021)  Depression (PHQ2-9): High Risk (01/19/2022)  Financial Resource Strain: Low Risk  (08/13/2021)  Physical Activity: Insufficiently Active (08/13/2021)  Social Connections: Socially Isolated (08/13/2021)  Stress: No Stress Concern Present (08/13/2021)  Tobacco Use: Medium Risk (01/20/2022)    Readmission Risk Interventions     No data to display

## 2022-01-25 DIAGNOSIS — S72001D Fracture of unspecified part of neck of right femur, subsequent encounter for closed fracture with routine healing: Secondary | ICD-10-CM

## 2022-01-25 MED ORDER — HALOPERIDOL LACTATE 2 MG/ML PO CONC: 0.5000 mg | ORAL | Status: DC | PRN

## 2022-01-25 MED ORDER — BIOTENE DRY MOUTH MT LIQD: 15.0000 mL | OROMUCOSAL | Status: DC | PRN

## 2022-01-25 MED ORDER — HALOPERIDOL LACTATE 5 MG/ML IJ SOLN
0.5000 mg | INTRAMUSCULAR | Status: DC | PRN
  Administered 2022-01-25: 0.5 mg via INTRAVENOUS
  Filled 2022-01-25 (×2): qty 1

## 2022-01-25 MED ORDER — HALOPERIDOL 0.5 MG PO TABS: 0.5000 mg | ORAL_TABLET | ORAL | Status: DC | PRN

## 2022-01-25 MED ORDER — ONDANSETRON HCL 4 MG/2ML IJ SOLN: 4.0000 mg | Freq: Four times a day (QID) | INTRAMUSCULAR | Status: DC | PRN

## 2022-01-25 MED ORDER — ONDANSETRON 4 MG PO TBDP: 4.0000 mg | ORAL_TABLET | Freq: Four times a day (QID) | ORAL | Status: DC | PRN

## 2022-01-25 MED ORDER — GLYCOPYRROLATE 0.2 MG/ML IJ SOLN: 0.2000 mg | INTRAMUSCULAR | Status: DC | PRN

## 2022-01-25 MED ORDER — GLYCOPYRROLATE 1 MG PO TABS: 1.0000 mg | ORAL_TABLET | ORAL | Status: DC | PRN

## 2022-01-25 MED ORDER — POLYVINYL ALCOHOL 1.4 % OP SOLN: 1.0000 [drp] | Freq: Four times a day (QID) | OPHTHALMIC | Status: DC | PRN

## 2022-01-25 NOTE — Plan of Care (Signed)

## 2022-01-25 NOTE — Progress Notes (Signed)
  Subjective:  Patient is an 89 year old male with dementia who has a right nondisplaced periprosthetic fracture proximal femur.  Decision has been made to treat this nonoperatively.  Patient's family is at the bedside today.  Patient is sleeping.  Family reports the patient had difficult night with a lot of agitation.  Objective:   VITALS:   Vitals:   01/24/22 0905 01/24/22 1805 01/24/22 2101 01/25/22 0810  BP: (!) 140/75 (!) 159/88 (!) 144/59 (!) 163/78  Pulse: 87 92 91 85  Resp: '16 16 17 16  '$ Temp: 98 F (36.7 C) (!) 97.4 F (36.3 C) 98.3 F (36.8 C) (!) 97.3 F (36.3 C)  TempSrc:      SpO2: 100% 92% 95% 99%  Weight:      Height:        PHYSICAL EXAM: Deferred  LABS  No results found for this or any previous visit (from the past 24 hour(s)).  No results found.  Assessment/Plan:     Principal Problem:   Closed right hip fracture (HCC) Active Problems:   Hyperlipidemia   History of atrial fibrillation   Mild dementia ([place])   Fall at home, initial encounter   Chronic kidney disease, stage 3a ([place])   CAD (coronary artery disease)   Leukocytosis   Protein-calorie malnutrition, severe   Oliguria and anuria  Continue PT as patient can tolerate.  Palliative care has been consulted.  Patient awaiting approval to go to a skilled nursing facility.  Continue Lovenox for DVT prophylaxis.  Patient may be discharged from an orthopedic standpoint once cleared medically.  He will follow-up in the office in approximately 2 weeks after discharge and he should remain on Lovenox until his follow-up.    [place] , MD 01/25/2022, 11:22 AM

## 2022-01-25 NOTE — Progress Notes (Signed)
Progress Note   Patient: [name] EXB:[id number] DOB: March 01, 1932 DOA: 01/20/2022     4 DOS: the patient was seen and examined on 01/25/2022   Brief hospital course: Taken from H&P.  [name] is a 89 y.o. male with medical history significant of hyperlipidemia, CAD, CABG, GERD, dementia, CKD-3A, atrial fibrillation not on anticoagulants (s/p Maze), prostate cancer, who presents with accidental fall while using the bathroom without turning on the light and right hip pain.  Rest of the ROS was negative  Data reviewed independently and ED Course: pt was found to have WBC 13.4, stable renal function, temperature normal, blood pressure 145/59, heart rate 81, RR 18, oxygen saturation 98% on room air.  CT of head is negative for acute intracranial abnormalities.  CT of C spine is negative for acute injury, but showed degenerative disc disease.  X-ray of right hip: Acute minimally displaced subtrochanteric periprosthetic right femoral fractur   EKG: I have personally reviewed.  Sinus rhythm, QTc 444, T wave inversion in V1-V4.  Consulted Dr. Mack Guise of Ortho.  He "discussed this case with the trauma specialist at [facility] who have been turned discussed it with their joint specialist.  It has been recommended that the patient be treated nonoperatively for this fracture.  No surgery is scheduled at this time.  He may received physical therapy and will be 25% weightbearing on the right lower extremity with a walker.  He will be on trunk precautions".   1/12: Remained hemodynamically stable.  Labs with stable BMP consistent with CKD stage IIIA, CBC with stable leukocytosis and hemoglobin.  PT/OT are recommending SNF. Patient has advanced dementia and oriented to self only at baseline.  1/13: Vital stable but patient was very somnolent and lethargic during morning rounds, received pain medications just before the shift change.  No urinary output recorded and PureWick canister hardly  have couple of mL.  Ordered bladder scan, LR bolus 500 cc followed by 1 L of LR. Need strict intake and output.  P.o. intake remained poor. Nursing concern of food pocketing and unable to swallow-swallow evaluation ordered. Bladder scan with more than 200 cc of urine, also ordered Flomax for concern of urinary retention.  1/14: Total recorded urinary output of 380 ML in 24-hour.  P.o. intake remained poor.  Received about 2 L of fluid yesterday, giving 2 more liters of fluid at 100 mL/h.  Slight worsening of creatinine to 1.41, still within baseline creatinine of 1.3-1.5. Wife would like to see how he does with short-term rehab, if not showing much improvement then she will consider taking him back home with hospice help.  Awaiting SNF placement.  1/15: No new concern.  Recorded UOP more than 900 cc.  Palliative care met with family and they will follow-up as outpatient while at short-term rehab to determine further needs and his response.  Wife needs some time to prepare house and herself for hospice at home if needed. P.o. intake remains poor.  Now had a bed offer, pending insurance authorization.  1/16: Hemodynamically stable.  Insurance declined SNF placement even with peer to peer review stating that he is hospice appropriate.  Family is looking at hospice facility option and if remains stable then might take him home for home hospice.  Palliative care on board.  P.o. intake remains very poor and continued to have pain.  Patient is appropriate for hospice facility, pending hospice approval and bed availability.  Patient is high risk for deterioration and mortality  based on age, advanced underlying dementia and recent hip fracture along with other comorbidities.     Assessment and Plan: * Closed right hip fracture ([place]) Minimally displaced subtrochanteric periprosthetic right femoral fracture.  Orthopedic currently recommending conservative management after talking with trauma team and joint  specialist at [facility].  Patient is high risk for surgical intervention. PT/OT are recommending SNF-which was declined by insurance -Continue with conservative management -Continue with pain control  Oliguria and anuria Improved with IV hydration. -Encourage p.o. hydration -Continue with strict intake and output  Fall at home, initial encounter Resulted in above mentioned fracture. -Management as mentioned above -Fall precaution   Hyperlipidemia -Continue Zetia  Chronic kidney disease, stage 3a ([place]) -Creatinine at baseline of 1.3-1.5, slight worsening today but remained within baseline -Monitor renal function -Avoid nephrotoxin  Mild dementia ([place]) Patient is oriented to self only at baseline. -Delirium and fall precautions  History of atrial fibrillation S/p of maze.  Not on any anticoagulation at home. -Continue to monitor  CAD (coronary artery disease) No chest pain. -Continue home Zetia  Leukocytosis Leukocytosis on admission most likely secondary to stress-induced demargination with fall and hip fracture.  Seems stable. -Continue to monitor  Protein-calorie malnutrition, severe Estimated body mass index is 17.8 kg/m as calculated from the following:   Height as of this encounter: '6\' 1"'$  (1.854 m).   Weight as of this encounter: 61.2 kg.   -Dietitian consult        Subjective: Patient was lying down in bed when seen today.  P.o. intake remained poor.  Daughter at bedside.  Physical Exam: Vitals:   01/24/22 0905 01/24/22 1805 01/24/22 2101 01/25/22 0810  BP: (!) 140/75 (!) 159/88 (!) 144/59 (!) 163/78  Pulse: 87 92 91 85  Resp: '16 16 17 16  '$ Temp: 98 F (36.7 C) (!) 97.4 F (36.3 C) 98.3 F (36.8 C) (!) 97.3 F (36.3 C)  TempSrc:      SpO2: 100% 92% 95% 99%  Weight:      Height:       General.  Frail and malnourished elderly man, in no acute distress. Pulmonary.  Lungs clear bilaterally, normal respiratory effort. CV.  Regular rate and  rhythm, no JVD, rub or murmur. Abdomen.  Soft, nontender, nondistended, BS positive. CNS.  Alert and oriented .  No focal neurologic deficit. Extremities.  No edema, no cyanosis, pulses intact and symmetrical. Psychiatry.  Judgment and insight appears impaired.  Data Reviewed: Prior data reviewed  Family Communication: Discussed with daughters at bedside  Disposition: Status is: Inpatient Remains inpatient appropriate because: Severity of illness  Planned Discharge Destination: Skilled nursing facility  DVT prophylaxis.  Lovenox Time spent: 45 minutes  This record has been created using Systems analyst. Errors have been sought and corrected,but may not always be located. Such creation errors do not reflect on the standard of care.   Author: Lorella Nimrod, MD 01/25/2022 3:34 PM  For on call review [email or web address].

## 2022-01-25 NOTE — Progress Notes (Signed)
Spoke with daughter Colletta [place] by phone today.  She states the family does not want the patient to attempt to get up and ambulate with a walker.  Patient will be bed to chair transfer only.  Use slide board if needed.  Bed and chair exercises are ok.

## 2022-01-25 NOTE — TOC Progression Note (Signed)
Transition of Care [facility]) - Progression Note    Patient Details  Name: HONDO NANDA MRN: [id number] Date of Birth: 1932-10-16  [facility]) CM/SW [place], LCSW Phone Number: 01/25/2022, 9:37 AM  Clinical Narrative:     CSW rec'd call from [place], [place].  EMS is approved [id number]).  Waiting on SNF peer-to-peer for medical determination.  Expected Discharge Plan: Skilled Nursing Facility Barriers to Discharge: Continued Medical Work up  Expected Discharge Plan and Services       Living arrangements for the past 2 months: Single Family Home                                       Social Determinants of Health (SDOH) Interventions SDOH Screenings   Food Insecurity: No Food Insecurity (01/20/2022)  Housing: Low Risk  (01/20/2022)  Transportation Needs: No Transportation Needs (01/20/2022)  Utilities: Not At Risk (01/20/2022)  Alcohol Screen: Low Risk  (08/13/2021)  Depression (PHQ2-9): High Risk (01/19/2022)  Financial Resource Strain: Low Risk  (08/13/2021)  Physical Activity: Insufficiently Active (08/13/2021)  Social Connections: Socially Isolated (08/13/2021)  Stress: No Stress Concern Present (08/13/2021)  Tobacco Use: Medium Risk (01/20/2022)    Readmission Risk Interventions     No data to display

## 2022-01-25 NOTE — Progress Notes (Signed)
PT Cancellation Note  Patient Details Name: [name] MRN: [id number] DOB: [birth year]   Cancelled Treatment:    Reason Eval/Treat Not Completed: Fatigue/lethargy limiting ability to participate;Patient at procedure or test/unavailable   Pt asleep this am and family with palliative care.  Returned this  PM and family again talking with palliative.  Will defer session today and continue tomorrow as appropriate.  Chesley Noon 01/25/2022, 1:11 PM

## 2022-01-25 NOTE — Assessment & Plan Note (Signed)
Resulted in above mentioned fracture. -Management as mentioned above -Fall precaution

## 2022-01-25 NOTE — Progress Notes (Addendum)
Palliative:  HPI: 89 y.o. male  with past medical history of dementia, CAD, CABG, CKD stage 3a, atrial fibrillation (s/p Maze), prostate cancer, GERD admitted on 01/20/2022 with fall and right hip pain. Not a candidate for surgery.     I met again at David's bedside and spoke with his daughter, Jenny Reichmann, while he slept. Breakfast is at bedside untouched. Jenny Reichmann reports that he has been too sleepy to attempt to feed. She reports that he had a difficult night last night with agitation/restlessness and is just now resting after he was cleaned and bathed and given Tylenol which has helped with his pain he had during his bath. Jenny Reichmann discussed next steps and plans for SNF. We did discuss that this is under insurance review at this time and may not be an option for him. We discussed hospice at home vs hospice facility. We discussed that he has not really been eating more than an occasional couple bites on occasion but went ~4 days without any intake. His renal function was declining even when he was on IVF. Cindy plans to speak with her stepmother further when she arrives to the hospital.   Update: I followed up at bedside with wife [name] and her daughter Colletta [place]. We further discussed plan from here and consideration of next steps. Insurance has denied SNF rehab stay. Neoma Laming does agree with hospice facility transition from here and will await to discuss with hospice liaison. I also discussed with Debbie medications and she confirms that she does desire minimizing medications to comfort medications.   Update: I was passing by and family was upset because they felt that Shanon Brow was desiring to sit up and RN was considering getting him up with walker. Family felt this would be difficult and very painful. I spoke with RN and family at bedside and we made the decision to change bed into chair position to allow him to sit up more without too much movement that would cause him more pain and discomfort. All agreed with this  plan.   All questions/concerns addressed. Emotional support provided.   Exam: Sleeping restfully. No distress at current time. Thin, frail. Breathing regular, unlabored. Abd soft.   Plan: - Comfort care. MOST form completed. Medications minimized per conversation with wife.  - Family discussing with hospice liaison hospice disposition options.  - Prognosis poor and likely < 2 weeks with acute decline and poor intake already with signs of declining renal function. He is also having significant pain secondary to fracture - particularly with any movement or activity including turning and bathing.   [place], NP Palliative Medicine Team Pager [phone] (Please see [email or web address] for schedule) Team Phone [phone]    Greater than 50%  of this time was spent counseling and coordinating care related to the above assessment and plan

## 2022-01-25 NOTE — Progress Notes (Signed)
Manufacturing engineer [facility]) Hospital Liaison Note   Received request from [facility] [place], to meet with family at bedside to explain hospice services (home vs Hospice Home). MSW met patient and wife/Deborah at bedside and provided extensive education for hospice services and provided information packet for them to reference if needed.  Family requested additional time to discuss options before making a final decision. MSW voiced understanding. All questions answered and no concerns voiced.  At this time, patient is not under review for [facility] services as family continues to have ongoing [facility] discussions. MSW to touch base with patient/family on 1.16.    [facility] information and contact numbers given to family & above information shared with TOC.   Please call with any questions/concerns.    Thank you for the opportunity to participate in this patient's care.   Phillis Haggis, MSW [facility] Liaison  [phone]

## 2022-01-25 NOTE — Care Management Important Message (Signed)
Important Message  Patient Details  Name: [name] MRN: [id number] Date of Birth: 03/13/1932   Medicare Important Message Given:  Yes     Juliann Pulse A Jolyssa Oplinger 01/25/2022, 10:41 AM

## 2022-01-25 NOTE — Progress Notes (Signed)
OT Cancellation Note  Patient Details Name: [name] MRN: [id number] DOB: 07-07-1932   Cancelled Treatment:    Reason Eval/Treat Not Completed: Other (comment) (pallative in patient room upon attempt. OT will rettempt as able.Shanon Payor, OTD OTR/L  01/25/22, 11:09 AM

## 2022-01-26 MED ORDER — OXYCODONE-ACETAMINOPHEN 5-325 MG PO TABS: 1.0000 | ORAL_TABLET | ORAL | 0 refills | Status: AC | PRN

## 2022-01-26 MED ORDER — LORAZEPAM 0.5 MG PO TABS: 1.0000 mg | ORAL_TABLET | Freq: Three times a day (TID) | ORAL | 0 refills | Status: AC | PRN

## 2022-01-26 MED ORDER — MORPHINE SULFATE (PF) 2 MG/ML IV SOLN: 1.0000 mg | Freq: Once | INTRAVENOUS | Status: DC | PRN

## 2022-01-26 MED ORDER — GLYCOPYRROLATE 1 MG PO TABS: 1.0000 mg | ORAL_TABLET | ORAL | 0 refills | Status: AC | PRN

## 2022-01-26 NOTE — Progress Notes (Signed)
Palliative:  HPI: 89 y.o. male  with past medical history of dementia, CAD, CABG, CKD stage 3a, atrial fibrillation (s/p Maze), prostate cancer, GERD admitted on 01/20/2022 with fall and right hip pain. Not a candidate for surgery.       I met today again at David's bedside with wife and daughter, [name]. Shanon Brow is resting comfortably but does respond when spoken too. He is pleasantly confused. He reports that he has pain but "not bad." He did eat some banana pudding. Family at bedside report that he has relief from oxycodone but then he needed to be cleaned and he did eventually respond well to haldol after being agitated during the night. Family have decided to proceed with hospice facility placement - I reassure that I believe this is a good option for them and that they will take good care of him there. They feel this is a good decision as well. I encouraged [name] that it is good for her to be able to be his wife and enjoy this time with him now. They have no further concerns at this time. Awaiting timing for transition to hospice facility.   All questions/concerns addressed. Emotional support provided.   Exam: Sleeping restfully but awakens to voice. No distress at current time. Thin, frail. Breathing regular, unlabored. Abd soft.   Plan: - Comfort care. MOST form completed. Medications minimized per conversation with wife.  - Family discussing with hospice liaison hospice disposition options.  - Prognosis poor and likely < 2 weeks with acute decline and poor intake already with signs of declining renal function. He is also having significant pain secondary to fracture - particularly with any movement or activity including turning and bathing.   25 min  Vinie Sill, NP Palliative Medicine Team Pager [phone] (Please see [email or web address] for schedule) Team Phone [phone]    Greater than 50%  of this time was spent counseling and coordinating care related to the above assessment and plan

## 2022-01-26 NOTE — Discharge Summary (Signed)
Discharge Summary  [name]:[id number] DOB: Oct 31, 1932  PCP: [name], DO  Admit date: 01/20/2022 Discharge date: 01/26/2022  Recommendations for Outpatient Follow-up:  Please follow up per hospice provider.  Discharge Diagnoses:  Active Hospital Problems   Diagnosis Date Noted   Closed right hip fracture ([place]) 01/20/2022    Priority: High   Oliguria and anuria 01/22/2022    Priority: 2.   Fall at home, initial encounter 01/20/2022    Priority: 2.   Hyperlipidemia 06/17/2009    Priority: 3.   Chronic kidney disease, stage 3a ([place]) 01/20/2022    Priority: 4.   Mild dementia ([place]) 06/07/2017    Priority: 5.   History of atrial fibrillation 06/17/2009    Priority: 6.   CAD (coronary artery disease) 01/20/2022    Priority: 7.   Leukocytosis 01/20/2022    Priority: 8.   Protein-calorie malnutrition, severe 01/21/2022    Priority: 9.    Resolved Hospital Problems  No resolved problems to display.   Discharge Condition: Stable   Diet recommendation: Diet Orders (From admission, onward)     Start     Ordered   01/21/22 1344  Diet regular Fluid consistency: Thin  Diet effective now       Question:  Fluid consistency:  Answer:  Thin   01/21/22 1343           HPI and Brief Hospital Course:  This is a close 89 year old gentleman medical history significant for hyperlipidemia, CAD, CABG, GERD, dementia, CKD stage III, atrial fibrillation, prostate cancer who presented after an accidental fall while using the bathroom.  X-ray of the right hip show evidence of acute displaced subtrochanteric periprosthetic right femoral fracture.  Dr. Christia Reading of orthopedic surgery was consulted, I discussed the case with trauma specialist at [facility] who also discussed with their joint specialist.  Orthopedic surgery recommends nonoperative management at this time.  No surgery was anticipated, patient is receiving physical therapy and is 25% weightbearing on the  right lower extremity with a walker.  Patient has remained hemodynamically stable during his hospital stay, he has advanced dementia and is oriented to self only at baseline.  Initial plan was for patient to be discharged to a subacute nursing facility, however insurance has declined subacute nursing facility placement despite peer to peer review.  Further discussion with (of care as well as with the patient and his wife, they decided to not pursue more months of services.  Patient and his wife did meet multiple times with palliative care services.  Ultimately, patient has been deemed appropriate for inpatient hospice, and has been accepted to inpatient hospice today.  Procedures: None  Consultations: Orthopedic surgery Dr. Mack Guise  Discharge details, plan of care and follow up instructions were discussed with the patient's wife who was at the bedside this morning during rounds.  Patient and family are in agreement with discharge from the hospital today and all questions were answered to their satisfaction.  Discharge Exam: BP (!) 105/55 (BP Location: Right Arm)   Pulse 79   Temp 98.8 F (37.1 C) (Oral)   Resp 18   Ht '6\' 1"'$  (1.854 m)   Wt 61.2 kg   SpO2 99%   BMI 17.80 kg/m  General:  Alert, disoriented, calm, in no acute distress  Eyes: EOMI, clear sclerea Neck: supple, no masses, trachea mildline  Cardiovascular: RRR, no murmurs or rubs, no peripheral edema  Respiratory: clear to auscultation bilaterally, no wheezes, no crackles  Abdomen: soft,  nontender, nondistended, normal bowel tones heard  Skin: dry, no rashes  Musculoskeletal: no joint effusions, normal range of motion  Neurologic: extraocular muscles intact, clear speech, moving all extremities with intact sensorium   Discharge Instructions You were cared for by a hospitalist during your hospital stay. If you have any questions about your discharge medications or the care you received while you were in the hospital after  you are discharged, you can call the unit and asked to speak with the hospitalist on call if the hospitalist that took care of you is not available. Once you are discharged, your primary care physician will handle any further medical issues. Please note that NO REFILLS for any discharge medications will be authorized once you are discharged, as it is imperative that you return to your primary care physician (or establish a relationship with a primary care physician if you do not have one) for your aftercare needs so that they can reassess your need for medications and monitor your lab values.   Allergies as of 01/26/2022       Reactions   Ambien [zolpidem Tartrate] Other (See Comments)   Super sensitive, Hallucinations   Pravastatin Sodium    REACTION: muscle aches...crestor   Rosuvastatin Calcium    REACTION: muscle aches...crestor   Zolpidem Tartrate    Hallucinations        Medication List     STOP taking these medications    baclofen 10 MG tablet Commonly known as: LIORESAL   celecoxib 100 MG capsule Commonly known as: CELEBREX   ezetimibe 10 MG tablet Commonly known as: ZETIA   FERROUS SULFATE PO   fluticasone 50 MCG/ACT nasal spray Commonly known as: FLONASE   meloxicam 15 MG tablet Commonly known as: MOBIC   Mobic 7.5 MG tablet Generic drug: meloxicam   pantoprazole 40 MG tablet Commonly known as: PROTONIX   vitamin C 250 MG tablet Commonly known as: ASCORBIC ACID   Vitamin D3 1.25 MG (50000 UT) Caps       TAKE these medications    bisacodyl 10 MG suppository Commonly known as: DULCOLAX Place 10 mg rectally daily as needed for moderate constipation.   dorzolamide-timolol 2-0.5 % ophthalmic solution Commonly known as: COSOPT 1 drop 2 (two) times daily.   glycopyrrolate 1 MG tablet Commonly known as: ROBINUL Take 1 tablet (1 mg total) by mouth every 4 (four) hours as needed (excessive secretions).   latanoprost 0.005 % ophthalmic  solution Commonly known as: XALATAN 0.005 drops daily.   LORazepam 0.5 MG tablet Commonly known as: Ativan Take 2 tablets (1 mg total) by mouth every 8 (eight) hours as needed for anxiety.   oxyCODONE-acetaminophen 5-325 MG tablet Commonly known as: PERCOCET/ROXICET Take 1 tablet by mouth every 4 (four) hours as needed for moderate pain.   polyethylene glycol powder 17 GM/SCOOP powder Commonly known as: GLYCOLAX/MIRALAX Take 17-34 g by mouth daily. For constipation   SENNA PO Take by mouth daily.       Allergies  Allergen Reactions   Ambien [Zolpidem Tartrate] Other (See Comments)    Super sensitive, Hallucinations   Pravastatin Sodium     REACTION: muscle aches...crestor   Rosuvastatin Calcium     REACTION: muscle aches...crestor   Zolpidem Tartrate     Hallucinations     The results of significant diagnostics from this hospitalization (including imaging, microbiology, ancillary and laboratory) are listed below for reference.    Significant Diagnostic Studies: CT HEAD WO CONTRAST (5MM)  Result Date: 01/20/2022  CLINICAL DATA:  Unwitnessed fall EXAM: CT HEAD WITHOUT CONTRAST CT CERVICAL SPINE WITHOUT CONTRAST TECHNIQUE: Multidetector CT imaging of the head and cervical spine was performed following the standard protocol without intravenous contrast. Multiplanar CT image reconstructions of the cervical spine were also generated. RADIATION DOSE REDUCTION: This exam was performed according to the departmental dose-optimization program which includes automated exposure control, adjustment of the mA and/or kV according to patient size and/or use of iterative reconstruction technique. COMPARISON:  11/04/2021 FINDINGS: CT HEAD FINDINGS Brain: Normal anatomic configuration. Parenchymal volume loss is commensurate with the patient's age. Stable mild periventricular white matter changes are present likely reflecting the sequela of small vessel ischemia. No abnormal intra or extra-axial  mass lesion or fluid collection. No abnormal mass effect or midline shift. No evidence of acute intracranial hemorrhage or infarct. Stable borderline ventriculomegaly which appears commensurate with the degree of parenchymal volume loss and likely reflects ex vacuo dilation. Cerebellum unremarkable. Vascular: No asymmetric hyperdense vasculature at the skull base. Skull: Intact Sinuses/Orbits: Paranasal sinuses are clear. Orbits are unremarkable. Other: Mastoid air cells and middle ear cavities are clear. CT CERVICAL SPINE FINDINGS Alignment: Normal. Skull base and vertebrae: Craniocervical alignment is normal. The atlantodental interval is not widened. No acute fracture of the cervical spine. There is ankylosis of the C6-T1 vertebral bodies anteriorly. Soft tissues and spinal canal: No prevertebral fluid or swelling. No visible canal hematoma. Moderate atherosclerotic calcification within the carotid bifurcations. Disc levels: There is intervertebral disc space narrowing, calcification, and endplate remodeling throughout the cervical spine in keeping with changes of severe degenerative disc disease. Prevertebral soft tissues are not thickened on sagittal reformats. Spinal canal is widely patent. Multilevel uncovertebral and facet arthrosis results in multilevel moderate to severe neuroforaminal narrowing, most severe on the right at C4-5. Upper chest: Negative. Other: None IMPRESSION: 1. No acute intracranial abnormality. No calvarial fracture. 2. No acute fracture or listhesis of the cervical spine. 3. Advanced degenerative disc and degenerative joint disease resulting in multilevel neuroforaminal narrowing, most severe on the right at C4-5. Electronically Signed   By: Fidela Salisbury M.D.   On: 01/20/2022 03:29   CT Cervical Spine Wo Contrast  Result Date: 01/20/2022 CLINICAL DATA:  Unwitnessed fall EXAM: CT HEAD WITHOUT CONTRAST CT CERVICAL SPINE WITHOUT CONTRAST TECHNIQUE: Multidetector CT imaging of the  head and cervical spine was performed following the standard protocol without intravenous contrast. Multiplanar CT image reconstructions of the cervical spine were also generated. RADIATION DOSE REDUCTION: This exam was performed according to the departmental dose-optimization program which includes automated exposure control, adjustment of the mA and/or kV according to patient size and/or use of iterative reconstruction technique. COMPARISON:  11/04/2021 FINDINGS: CT HEAD FINDINGS Brain: Normal anatomic configuration. Parenchymal volume loss is commensurate with the patient's age. Stable mild periventricular white matter changes are present likely reflecting the sequela of small vessel ischemia. No abnormal intra or extra-axial mass lesion or fluid collection. No abnormal mass effect or midline shift. No evidence of acute intracranial hemorrhage or infarct. Stable borderline ventriculomegaly which appears commensurate with the degree of parenchymal volume loss and likely reflects ex vacuo dilation. Cerebellum unremarkable. Vascular: No asymmetric hyperdense vasculature at the skull base. Skull: Intact Sinuses/Orbits: Paranasal sinuses are clear. Orbits are unremarkable. Other: Mastoid air cells and middle ear cavities are clear. CT CERVICAL SPINE FINDINGS Alignment: Normal. Skull base and vertebrae: Craniocervical alignment is normal. The atlantodental interval is not widened. No acute fracture of the cervical spine. There is ankylosis of the C6-T1  vertebral bodies anteriorly. Soft tissues and spinal canal: No prevertebral fluid or swelling. No visible canal hematoma. Moderate atherosclerotic calcification within the carotid bifurcations. Disc levels: There is intervertebral disc space narrowing, calcification, and endplate remodeling throughout the cervical spine in keeping with changes of severe degenerative disc disease. Prevertebral soft tissues are not thickened on sagittal reformats. Spinal canal is widely  patent. Multilevel uncovertebral and facet arthrosis results in multilevel moderate to severe neuroforaminal narrowing, most severe on the right at C4-5. Upper chest: Negative. Other: None IMPRESSION: 1. No acute intracranial abnormality. No calvarial fracture. 2. No acute fracture or listhesis of the cervical spine. 3. Advanced degenerative disc and degenerative joint disease resulting in multilevel neuroforaminal narrowing, most severe on the right at C4-5. Electronically Signed   By: Fidela Salisbury M.D.   On: 01/20/2022 03:29   DG Chest 1 View  Result Date: 01/20/2022 CLINICAL DATA:  Fall EXAM: CHEST  1 VIEW COMPARISON:  08/18/2021 FINDINGS: Lungs are clear. No pneumothorax or pleural effusion. Coronary artery bypass grafting and left atrial clipping has been performed. Cardiac size within normal limits. Pulmonary vascularity is normal. No acute bone abnormality. IMPRESSION: 1. No active disease. Electronically Signed   By: Fidela Salisbury M.D.   On: 01/20/2022 03:20   DG Hip Unilat W or Wo Pelvis 2-3 Views Right  Result Date: 01/20/2022 CLINICAL DATA:  Initial evaluation for acute trauma, fall. EXAM: DG HIP (WITH OR WITHOUT PELVIS) 2-3V RIGHT COMPARISON:  Prior study from 02/04/2014. FINDINGS: Bones are diffusely osteopenic, somewhat limiting assessment for fine osseous detail. Right total hip arthroplasty in place. There is an acute minimally displaced fracture extending through the subtrochanteric right femur. Femoral and acetabular components remain in place. No acute osseous abnormality about the visualized bony pelvis. Osteoarthritic changes noted about the contralateral left hip. Mild soft tissue swelling about the fracture site. Scattered vascular clips noted about the pelvis. IMPRESSION: Acute minimally displaced subtrochanteric periprosthetic right femoral fracture. Electronically Signed   By: Jeannine Boga M.D.   On: 01/20/2022 03:11    Microbiology: No results found for this or any  previous visit (from the past 240 hour(s)).   Labs: Basic Metabolic Panel: Recent Labs  Lab 01/20/22 0840 01/21/22 0418 01/23/22 0727  NA 136 137 135  K 3.6 4.2 3.9  CL 104 105 103  CO2 '23 25 23  '$ GLUCOSE 136* 131* 121*  BUN 24* 25* 36*  CREATININE 1.24 1.24 1.41*  CALCIUM 9.1 9.3 9.0   Liver Function Tests: No results for input(s): "AST", "ALT", "ALKPHOS", "BILITOT", "PROT", "ALBUMIN" in the last 168 hours. No results for input(s): "LIPASE", "AMYLASE" in the last 168 hours. No results for input(s): "AMMONIA" in the last 168 hours. CBC: Recent Labs  Lab 01/20/22 0840 01/21/22 0418 01/23/22 0727  WBC 13.4* 13.3* 10.9*  NEUTROABS 10.6*  --   --   HGB 10.7* 10.9* 9.3*  HCT 33.0* 33.2* 28.3*  MCV 85.5 84.9 84.5  PLT 216 205 186   Cardiac Enzymes: No results for input(s): "CKTOTAL", "CKMB", "CKMBINDEX", "TROPONINI" in the last 168 hours. BNP: BNP (last 3 results) No results for input(s): "BNP" in the last 8760 hours.  ProBNP (last 3 results) No results for input(s): "PROBNP" in the last 8760 hours.  CBG: No results for input(s): "GLUCAP" in the last 168 hours.  Time spent: > 30 minutes were spent in preparing this discharge including medication reconciliation, counseling, and coordination of care.  Signed:  Ashyr Hedgepath Marry Guan, MD  [facility] 01/26/2022,  1:23 PM

## 2022-01-26 NOTE — Progress Notes (Signed)
Pt's wife, [name] called for update if pt is already picked up by EMS going to Hospice. Updated that pt is still in the unit, awaiting transport by [facility]. Wife requested for this RN to give oxycodone for pain and that may give haldol if agitated.

## 2022-01-26 NOTE — Progress Notes (Signed)
This nurse called in AVS/report to [facility] per protocol pending when Pt will be transported by [facility].report was given to staff nurse Ms Dola Argyle.TOC/case manager Ms Bethanne Ginger already notified [facility] of upcoming transport.Family requested nurse to inform when Pt is been transported.incoming nurse updated at this time.

## 2022-01-26 NOTE — Progress Notes (Signed)
  Subjective:  Patient is seen in his room today with his wife and a caretaker at the bedside.  Patient is confused and has removed his external catheter.  He is in no acute distress and appears comfortable despite the confusion.  He is holding his right knee and hip in a hyper flexed position.    Objective:   VITALS:   Vitals:   01/24/22 1805 01/24/22 2101 01/25/22 0810 01/25/22 1642  BP: (!) 159/88 (!) 144/59 (!) 163/78 (!) 105/55  Pulse: 92 91 85 79  Resp: '16 17 16 18  '$ Temp: (!) 97.4 F (36.3 C) 98.3 F (36.8 C) (!) 97.3 F (36.3 C) 98.8 F (37.1 C)  TempSrc:    Oral  SpO2: 92% 95% 99% 99%  Weight:      Height:        PHYSICAL EXAM: Right lower extremity Neurovascular intact Sensation intact distally Intact pulses distally Dorsiflexion/Plantar flexion intact No cellulitis present Compartment soft  LABS  No results found for this or any previous visit (from the past 24 hour(s)).  No results found.  Assessment/Plan:     Principal Problem:   Closed right hip fracture (HCC) Active Problems:   Hyperlipidemia   History of atrial fibrillation   Mild dementia ([place])   Fall at home, initial encounter   Chronic kidney disease, stage 3a ([place])   CAD (coronary artery disease)   Leukocytosis   Protein-calorie malnutrition, severe   Oliguria and anuria   Patient assisted to bring his right leg gently into extension in line with his left leg.  A pillow was placed between his knees for comfort.  Patient did not have any pain with passive assisted extension of his right hip and knee.  Patient will be discharged to hospice.  Patient may follow-up in my office in 1 to 2 weeks for reevaluation and x-ray.  Will continue bed to chair transfer as he can tolerate.  Patient's family has requested that he not made to weight-bear or ambulate on his right lower extremity.   Thornton Park , MD 01/26/2022, 4:41 PM

## 2022-01-26 NOTE — TOC Transition Note (Signed)
Transition of Care [facility]) - CM/SW Discharge Note   Patient Details  Name: [name] MRN: [id number] Date of Birth: 04-22-1932  [facility]) CM/SW Contact:  Quin Hoop, LCSW Phone Number: 01/26/2022, 3:59 PM   Clinical Narrative:    Patient discharging to [facility] in [place].  Discharge summary complete.  Patient will be transported via [facility].  Transport forms printed to nursing station.   Final next level of care: [place] Barriers to Discharge: No Barriers Identified   Patient Goals and CMS Choice CMS Medicare.gov Compare Post Acute Care list provided to:: Patient Represenative (must comment) (facility to be generated and given to family) Choice offered to / list presented to : Spouse  Discharge Placement                  Patient to be transferred to facility by: [facility]   Patient and family notified of of transfer: 01/26/22  Discharge Plan and Services Additional resources added to the After Visit Summary for                                       Social Determinants of Health (SDOH) Interventions SDOH Screenings   Food Insecurity: No Food Insecurity (01/20/2022)  Housing: Low Risk  (01/20/2022)  Transportation Needs: No Transportation Needs (01/20/2022)  Utilities: Not At Risk (01/20/2022)  Alcohol Screen: Low Risk  (08/13/2021)  Depression (PHQ2-9): High Risk (01/19/2022)  Financial Resource Strain: Low Risk  (08/13/2021)  Physical Activity: Insufficiently Active (08/13/2021)  Social Connections: Socially Isolated (08/13/2021)  Stress: No Stress Concern Present (08/13/2021)  Tobacco Use: Medium Risk (01/20/2022)     Readmission Risk Interventions     No data to display

## 2022-01-26 NOTE — Progress Notes (Addendum)
Manufacturing engineer [facility]) Hospital Liaison Note  Received request from [facility] [place] for family interest in [facility]. Visited patient at bedside and spoke with wife/Deborah to confirm interest and explain services.  Approval for Hospice Home is determined by [facility] MD. Once [facility] MD has determined Hospice Home eligibility, Lucky will update hospital staff and family.  Please do not hesitate to call with any hospice related questions.    Thank you for the opportunity to participate in this patient's care.  Addendum-- 4:54pm  Patient has been offered a bed and family accepted. Consent forms have been completed.  EMS notified of patient D/C and transport arranged by TOC/Gilda. and Attending Physician/Dr.Ikramullah also notified of transport arrangement.    Please send signed DNR form with patient and RN call report to [phone].   Phillis Haggis, MSW [facility] Liaison  [phone]

## 2022-01-26 NOTE — Progress Notes (Signed)
Nutrition Brief Note  Chart reviewed. Pt now transitioning to comfort care. Hospice determining eligibility for home hospice vs residential hospice.  No further nutrition interventions planned at this time.  Please re-consult as needed.   Loistine Chance, RD, LDN, [place] Registered Dietitian II Certified Diabetes Care and Education Specialist Please refer to [facility] for RD and/or RD on-call/weekend/after hours pager

## 2022-01-26 NOTE — Progress Notes (Signed)
Progress Note   Patient: [name] CHY:[id number] DOB: 07/06/1932 DOA: 01/20/2022     5 DOS: the patient was seen and examined on 01/26/2022   Brief hospital course: Taken from H&P.  [name] is a 89 y.o. male with medical history significant of hyperlipidemia, CAD, CABG, GERD, dementia, CKD-3A, atrial fibrillation not on anticoagulants (s/p Maze), prostate cancer, who presents with accidental fall while using the bathroom without turning on the light and right hip pain.  Rest of the ROS was negative  Data reviewed independently and ED Course: pt was found to have WBC 13.4, stable renal function, temperature normal, blood pressure 145/59, heart rate 81, RR 18, oxygen saturation 98% on room air.  CT of head is negative for acute intracranial abnormalities.  CT of C spine is negative for acute injury, but showed degenerative disc disease.  X-ray of right hip: Acute minimally displaced subtrochanteric periprosthetic right femoral fractur   Consulted Dr. Mack Guise of Ortho.  He "discussed this case with the trauma specialist at [facility] who have been turned discussed it with their joint specialist.  It has been recommended that the patient be treated nonoperatively for this fracture.  No surgery is scheduled at this time.  He may received physical therapy and will be 25% weightbearing on the right lower extremity with a walker.  He will be on trunk precautions".   1/12: Remained hemodynamically stable.  Labs with stable BMP consistent with CKD stage IIIA, CBC with stable leukocytosis and hemoglobin.  PT/OT are recommending SNF. Patient has advanced dementia and oriented to self only at baseline.  1/13: Vital stable but patient was very somnolent and lethargic during morning rounds, received pain medications just before the shift change.  No urinary output recorded and PureWick canister hardly have couple of mL.  Ordered bladder scan, LR bolus 500 cc followed by 1 L of LR. Need  strict intake and output.  P.o. intake remained poor. Nursing concern of food pocketing and unable to swallow-swallow evaluation ordered. Bladder scan with more than 200 cc of urine, also ordered Flomax for concern of urinary retention.  1/14: Total recorded urinary output of 380 ML in 24-hour.  P.o. intake remained poor.  Received about 2 L of fluid yesterday, giving 2 more liters of fluid at 100 mL/h.  Slight worsening of creatinine to 1.41, still within baseline creatinine of 1.3-1.5. Wife would like to see how he does with short-term rehab, if not showing much improvement then she will consider taking him back home with hospice help.  Awaiting SNF placement.  1/15: No new concern.  Recorded UOP more than 900 cc.  Palliative care met with family and they will follow-up as outpatient while at short-term rehab to determine further needs and his response.  Wife needs some time to prepare house and herself for hospice at home if needed. P.o. intake remains poor.  Now had a bed offer, pending insurance authorization.  1/16: Hemodynamically stable.  Insurance declined SNF placement even with peer to peer review stating that he is hospice appropriate.  Family is looking at hospice facility option and if remains stable then might take him home for home hospice.  Palliative care on board.  P.o. intake remains very poor and continued to have pain.  Patient is appropriate for hospice facility, pending hospice approval and bed availability.  1/17: Seen with wife at bedside. Has been intermittently agitated at night. They are awaiting Hospice approval and placement.  Patient is high risk  for deterioration and mortality based on age, advanced underlying dementia and recent hip fracture along with other comorbidities.    Assessment and Plan: * Closed right hip fracture ([place]) Minimally displaced subtrochanteric periprosthetic right femoral fracture.  Orthopedic currently recommending conservative management  after talking with trauma team and joint specialist at [facility].  Patient is high risk for surgical intervention. PT/OT are recommending SNF-which was declined by insurance -Continue with conservative management -Continue with pain control  Oliguria and anuria Improved with IV hydration. -Encourage p.o. hydration -Continue with strict intake and output  Fall at home, initial encounter Resulted in above mentioned fracture. -Management as mentioned above -Fall precaution   Hyperlipidemia -Continue Zetia  Chronic kidney disease, stage 3a (HCC) -Creatinine at baseline of 1.3-1.5, renal function at baseline -Monitor renal function -Avoid nephrotoxin  Mild dementia ([place]) Patient is oriented to self only at baseline, discussed with wife. -Delirium and fall precautions  History of atrial fibrillation S/p of maze.  Not on any anticoagulation at home. -Continue to monitor  CAD (coronary artery disease) No chest pain. -Continue home Zetia  Leukocytosis Leukocytosis on admission most likely secondary to stress-induced demargination with fall and hip fracture.  Seems stable. -Continue to monitor  Protein-calorie malnutrition, severe Estimated body mass index is 17.8 kg/m as calculated from the following:   Height as of this encounter: '6\' 1"'$  (1.854 m).   Weight as of this encounter: 61.2 kg.   -Dietitian consult     Subjective:  Lying in bed resting, family at bedside this AM.  Physical Exam: Vitals:   01/24/22 1805 01/24/22 2101 01/25/22 0810 01/25/22 1642  BP: (!) 159/88 (!) 144/59 (!) 163/78 (!) 105/55  Pulse: 92 91 85 79  Resp: '16 17 16 18  '$ Temp: (!) 97.4 F (36.3 C) 98.3 F (36.8 C) (!) 97.3 F (36.3 C) 98.8 F (37.1 C)  TempSrc:    Oral  SpO2: 92% 95% 99% 99%  Weight:      Height:      General:  sleeping, disoriented, calm, in no acute distress  Eyes: EOMI, clear conjuctivae, white sclerea Neck: supple, no masses, trachea mildline  Cardiovascular: RRR,  no murmurs or rubs, no peripheral edema  Respiratory: clear to auscultation bilaterally, no wheezes, no crackles  Abdomen: soft, nontender, nondistended, normal bowel tones heard  Skin: dry, no rashes  Musculoskeletal: no joint effusions, normal range of motion  Neurologic: extraocular muscles intact, clear speech, moving all extremities with intact sensorium    Data Reviewed: Prior data reviewed  Family Communication: Discussed with daughter at bedside this AM.  Disposition: Status is: Inpatient Remains inpatient appropriate because: Severity of illness  Planned Discharge Destination: Skilled nursing facility  DVT prophylaxis.  Lovenox Time spent: 25 minutes  Author: Zsofia Prout Marry Guan, MD 01/26/2022 10:26 AM  For on call review [email or web address].

## 2022-01-27 ENCOUNTER — Telehealth: Payer: Self-pay | Admitting: Family Medicine

## 2022-01-27 NOTE — Telephone Encounter (Signed)
Called the pt to get the pt scheduled for a hospital follow-up and the pts wife informed me that he had broke his femur and he is in hospice and not sure when he will be getting out.  If they needing anything from [place] they will call.

## 2022-02-10 DEATH — deceased

## 2022-06-02 ENCOUNTER — Other Ambulatory Visit: Payer: PPO

## 2022-06-08 ENCOUNTER — Ambulatory Visit: Payer: PPO | Admitting: Urology

## 2022-07-20 ENCOUNTER — Ambulatory Visit: Payer: PPO | Admitting: Family Medicine

## 2022-09-15 IMAGING — RF DG SWALLOWING FUNCTION
13 of 18 series · 13 of 24 positions shown · non-contrast
Comparison: None.

CLINICAL DATA: Dysphagia. Cough/GE reflux disease/other secondary
diagnosis

EXAM:
MODIFIED BARIUM SWALLOW
TECHNIQUE: Different consistencies of barium were administered orally to the
patient by the Speech Pathologist. Imaging of the pharynx was
performed in the lateral projection. The radiologist was present in
the fluoroscopy room for this study, providing personal supervision.
FLUOROSCOPY TIME:  Fluoroscopy Time:  2 minutes 24 seconds
Radiation Exposure Index (if provided by the fluoroscopic device):
2.7 mGy
Number of Acquired Spot Images: 0

[Series 1: run · 1 of 20 frames shown (1 of 13)]
[frame 4/20]
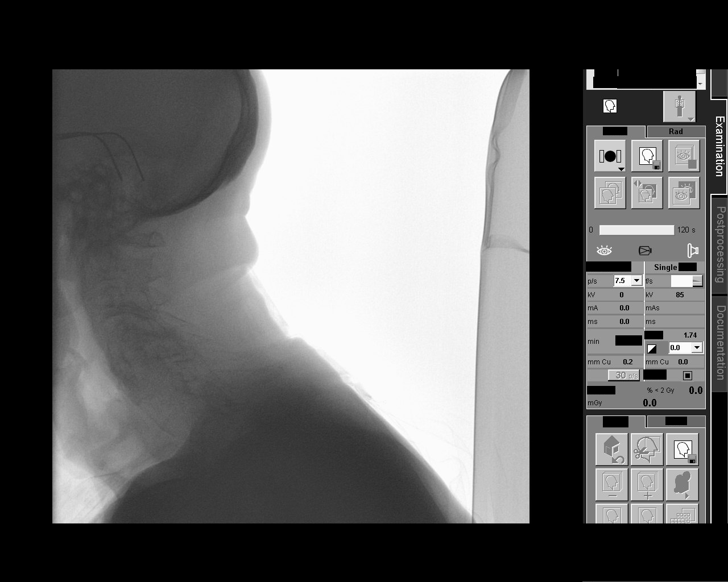

[Series 3: run · 1 of 242 frames shown (2 of 13)]
[frame 14/242]
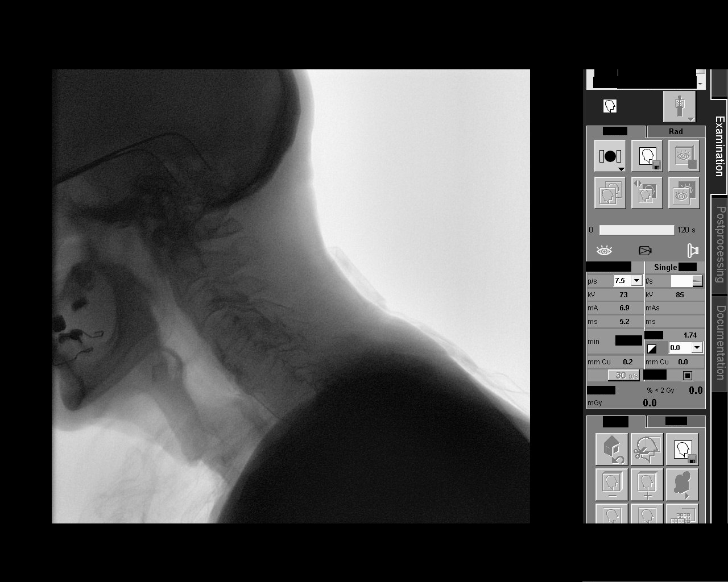

[Series 4: run · 1 of 168 frames shown (3 of 13)]
[frame 85/168]
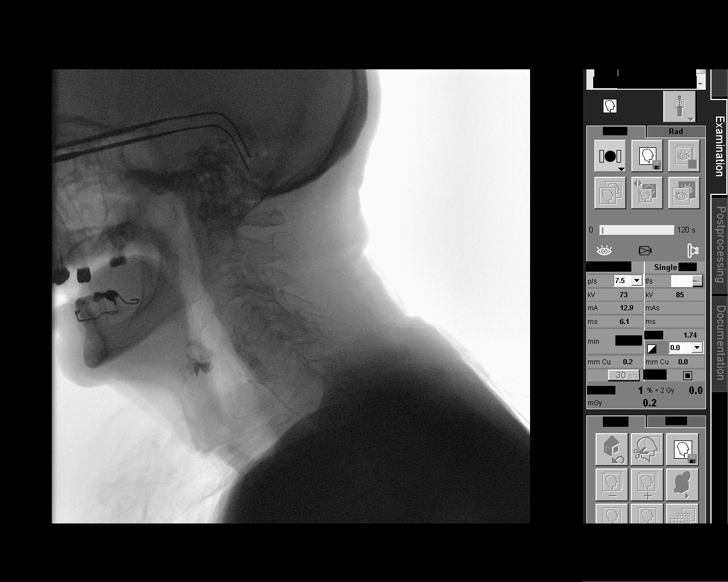

[Series 6: run · 1 of 387 frames shown (4 of 13)]
[frame 59/387]
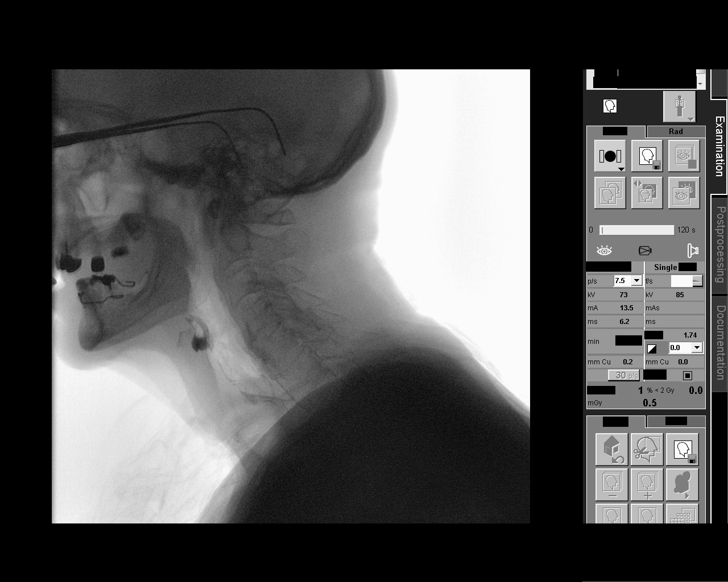

[Series 7: run · 1 of 158 frames shown (5 of 13)]
[frame 80/158]
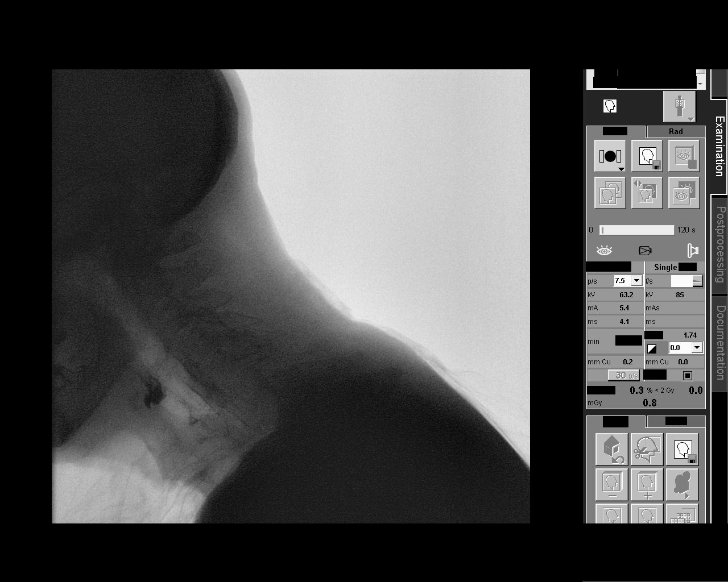

[Series 9: run · 1 of 225 frames shown (6 of 13)]
[frame 34/225]
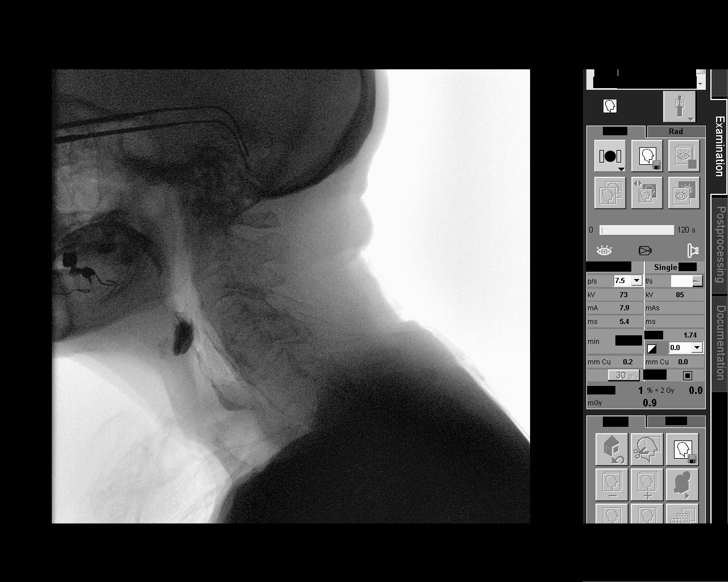

[Series 10: run · 1 of 261 frames shown (7 of 13)]
[frame 131/261]
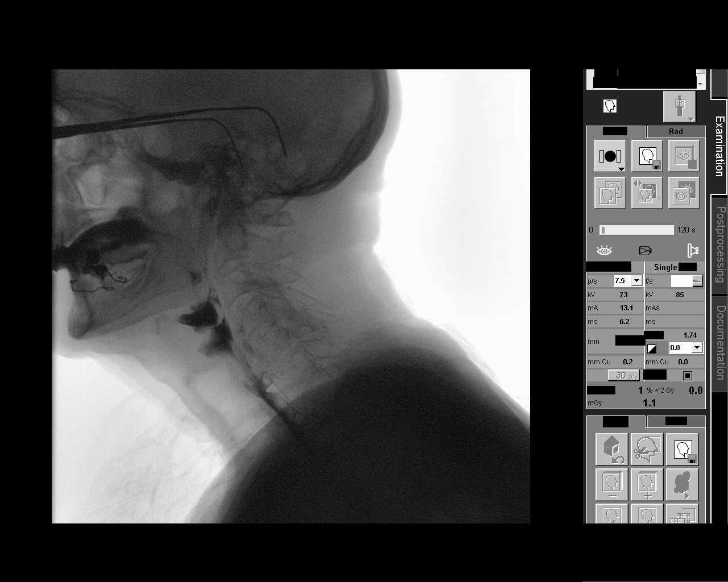

[Series 11: run · 1 of 178 frames shown (8 of 13)]
[frame 90/178]
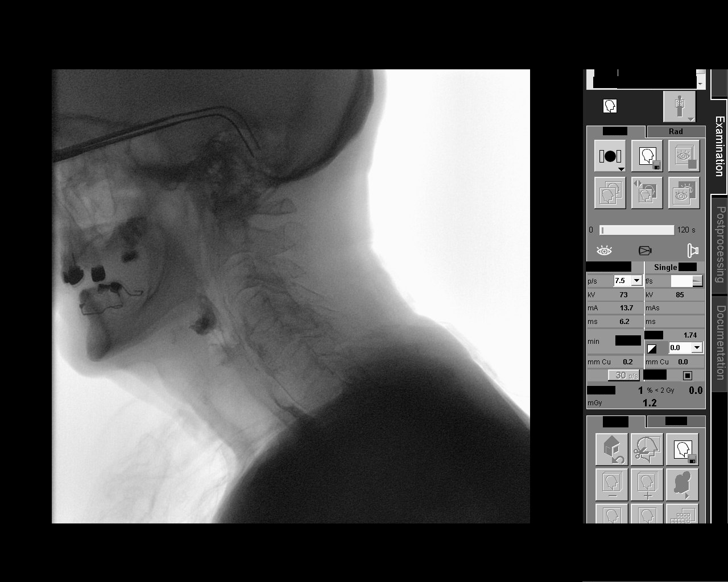

[Series 12: run · 1 of 132 frames shown (9 of 13)]
[frame 113/132]
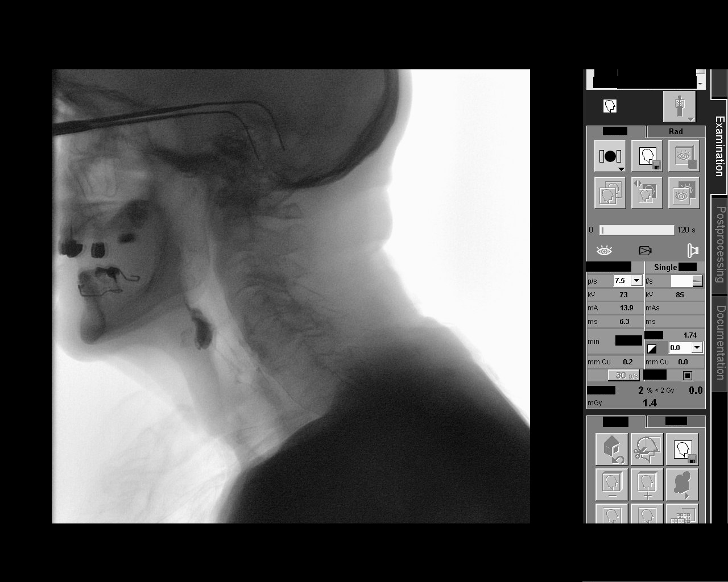

[Series 14: run · 1 of 361 frames shown (10 of 13)]
[frame 136/361]
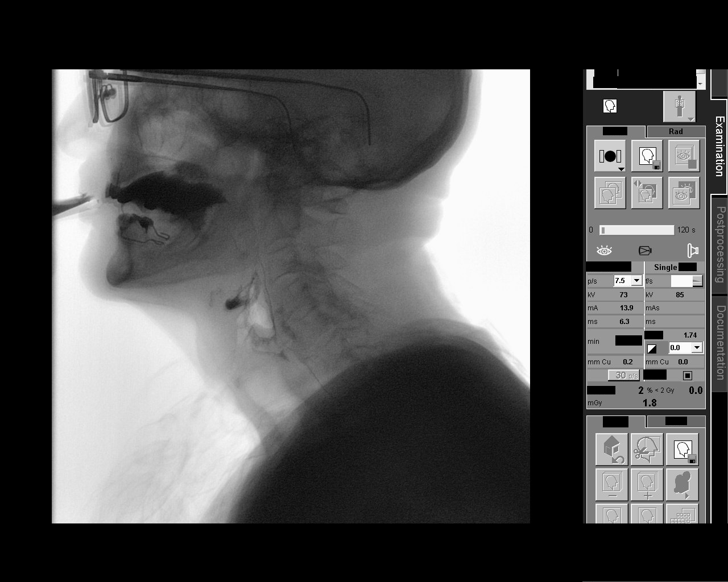

[Series 15: run · 1 of 256 frames shown (11 of 13)]
[frame 218/256]
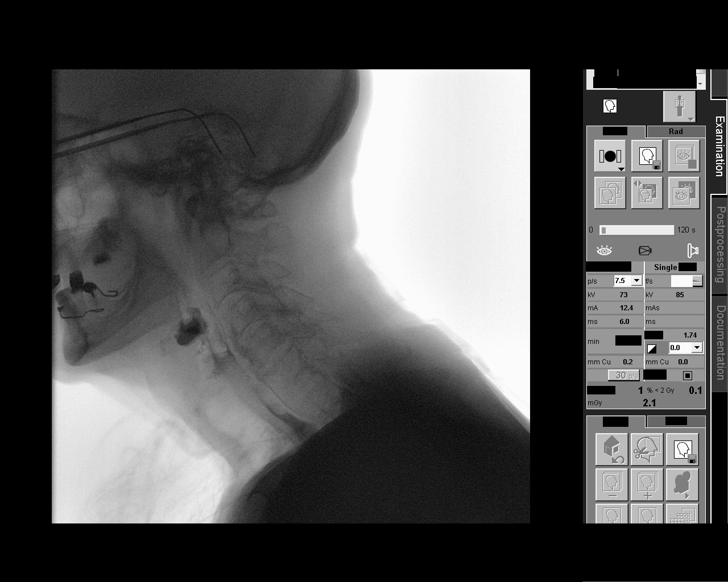

[Series 17: run · 1 of 429 frames shown (12 of 13)]
[frame 172/429]
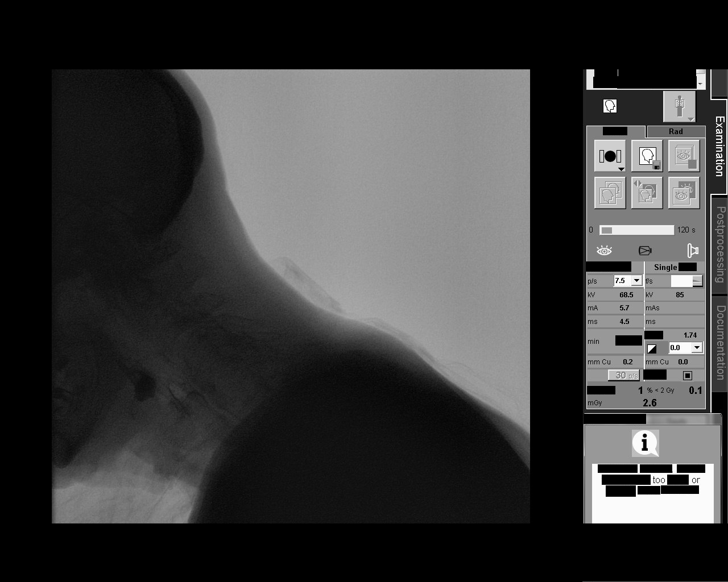

[Series 18: run · 1 of 74 frames shown (13 of 13)]
[frame 63/74]
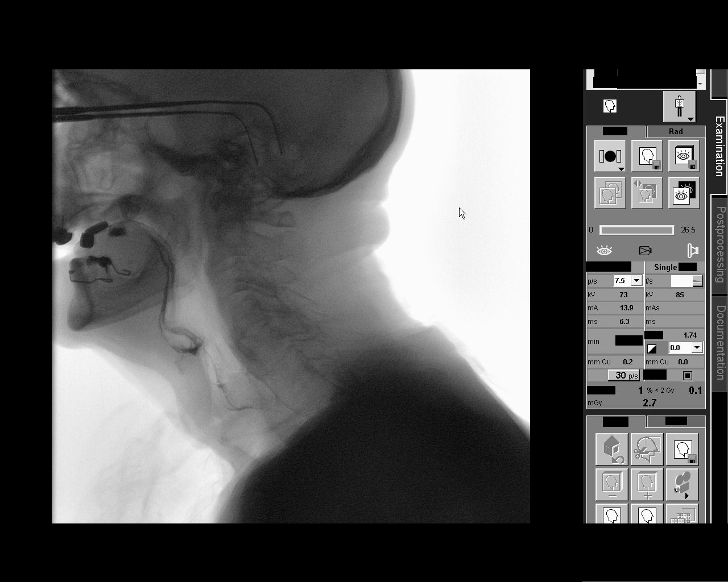

[13 of 24 positions shown; findings below may reference images not displayed]

FINDINGS: Real-time fluoroscopy of the swallowing function was performed with
a speech pathologist present.

Multiple consistencies of barium were administered which included
water, nectar, applesauce, barium tablet, and Xavier cracker.

No laryngeal penetration or tracheal aspiration. Significant
vallecular residuals throughout the exam. The barium tablet becomes
lodged in the vallecula which the patient is able to easily
regurgitate.
IMPRESSION: Modified barium swallow as described above.

Please refer to the Speech Pathologists report for complete details
and recommendations.
